# Patient Record
Sex: Male | Born: 1945 | Race: White | Hispanic: No | Smoking: Current some day smoker
Health system: Southern US, Community
[De-identification: ages and names within clinical notes are randomized; demographics above are authoritative.]

## PROBLEM LIST (undated history)

## (undated) DIAGNOSIS — J189 Pneumonia, unspecified organism: Secondary | ICD-10-CM

## (undated) DIAGNOSIS — C4490 Unspecified malignant neoplasm of skin, unspecified: Secondary | ICD-10-CM

## (undated) DIAGNOSIS — S82892K Other fracture of left lower leg, subsequent encounter for closed fracture with nonunion: Secondary | ICD-10-CM

## (undated) DIAGNOSIS — M549 Dorsalgia, unspecified: Secondary | ICD-10-CM

## (undated) DIAGNOSIS — E119 Type 2 diabetes mellitus without complications: Secondary | ICD-10-CM

## (undated) DIAGNOSIS — E539 Vitamin B deficiency, unspecified: Secondary | ICD-10-CM

## (undated) DIAGNOSIS — F419 Anxiety disorder, unspecified: Secondary | ICD-10-CM

## (undated) DIAGNOSIS — K219 Gastro-esophageal reflux disease without esophagitis: Secondary | ICD-10-CM

## (undated) DIAGNOSIS — M1990 Unspecified osteoarthritis, unspecified site: Secondary | ICD-10-CM

## (undated) DIAGNOSIS — I10 Essential (primary) hypertension: Secondary | ICD-10-CM

## (undated) DIAGNOSIS — F329 Major depressive disorder, single episode, unspecified: Secondary | ICD-10-CM

## (undated) DIAGNOSIS — H548 Legal blindness, as defined in USA: Secondary | ICD-10-CM

## (undated) DIAGNOSIS — F32A Depression, unspecified: Secondary | ICD-10-CM

## (undated) DIAGNOSIS — E7800 Pure hypercholesterolemia, unspecified: Secondary | ICD-10-CM

## (undated) DIAGNOSIS — Z87442 Personal history of urinary calculi: Secondary | ICD-10-CM

## (undated) DIAGNOSIS — G8929 Other chronic pain: Secondary | ICD-10-CM

## (undated) HISTORY — PX: CATARACT EXTRACTION W/ INTRAOCULAR LENS  IMPLANT, BILATERAL: SHX1307

## (undated) HISTORY — DX: Gastro-esophageal reflux disease without esophagitis: K21.9

## (undated) HISTORY — DX: Pure hypercholesterolemia, unspecified: E78.00

## (undated) HISTORY — DX: Vitamin B deficiency, unspecified: E53.9

## (undated) HISTORY — PX: FRACTURE SURGERY: SHX138

## (undated) HISTORY — DX: Essential (primary) hypertension: I10

---
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
## 2005-11-19 ENCOUNTER — Ambulatory Visit (HOSPITAL_COMMUNITY): Admission: RE | Admit: 2005-11-19 | Discharge: 2005-11-19 | Payer: Self-pay | Admitting: Ophthalmology

## 2011-03-20 ENCOUNTER — Other Ambulatory Visit (HOSPITAL_COMMUNITY): Payer: Self-pay | Admitting: Urology

## 2011-03-20 ENCOUNTER — Ambulatory Visit (HOSPITAL_COMMUNITY)
Admission: RE | Admit: 2011-03-20 | Discharge: 2011-03-20 | Disposition: A | Discharge status: Home / Self Care | Payer: Medicare Other | Source: Ambulatory Visit | Attending: Urology | Admitting: Urology

## 2011-03-20 DIAGNOSIS — R109 Unspecified abdominal pain: Secondary | ICD-10-CM

## 2011-03-20 DIAGNOSIS — R1032 Left lower quadrant pain: Secondary | ICD-10-CM | POA: Insufficient documentation

## 2015-03-26 DIAGNOSIS — M79671 Pain in right foot: Secondary | ICD-10-CM | POA: Diagnosis not present

## 2015-03-26 DIAGNOSIS — M25579 Pain in unspecified ankle and joints of unspecified foot: Secondary | ICD-10-CM | POA: Diagnosis not present

## 2015-03-26 DIAGNOSIS — M79672 Pain in left foot: Secondary | ICD-10-CM | POA: Diagnosis not present

## 2015-04-10 DIAGNOSIS — L110 Acquired keratosis follicularis: Secondary | ICD-10-CM | POA: Diagnosis not present

## 2015-04-10 DIAGNOSIS — E1140 Type 2 diabetes mellitus with diabetic neuropathy, unspecified: Secondary | ICD-10-CM | POA: Diagnosis not present

## 2015-04-10 DIAGNOSIS — L609 Nail disorder, unspecified: Secondary | ICD-10-CM | POA: Diagnosis not present

## 2015-04-16 DIAGNOSIS — E113519 Type 2 diabetes mellitus with proliferative diabetic retinopathy with macular edema, unspecified eye: Secondary | ICD-10-CM | POA: Diagnosis not present

## 2015-04-16 DIAGNOSIS — H40001 Preglaucoma, unspecified, right eye: Secondary | ICD-10-CM | POA: Diagnosis not present

## 2015-04-16 DIAGNOSIS — D3132 Benign neoplasm of left choroid: Secondary | ICD-10-CM | POA: Diagnosis not present

## 2015-04-16 DIAGNOSIS — Z961 Presence of intraocular lens: Secondary | ICD-10-CM | POA: Diagnosis not present

## 2015-04-16 DIAGNOSIS — E11311 Type 2 diabetes mellitus with unspecified diabetic retinopathy with macular edema: Secondary | ICD-10-CM | POA: Diagnosis not present

## 2015-04-29 DIAGNOSIS — F419 Anxiety disorder, unspecified: Secondary | ICD-10-CM | POA: Diagnosis not present

## 2015-04-29 DIAGNOSIS — I10 Essential (primary) hypertension: Secondary | ICD-10-CM | POA: Diagnosis not present

## 2015-04-29 DIAGNOSIS — E119 Type 2 diabetes mellitus without complications: Secondary | ICD-10-CM | POA: Diagnosis not present

## 2015-05-24 DIAGNOSIS — I739 Peripheral vascular disease, unspecified: Secondary | ICD-10-CM | POA: Diagnosis not present

## 2015-05-24 DIAGNOSIS — L603 Nail dystrophy: Secondary | ICD-10-CM | POA: Diagnosis not present

## 2015-05-24 DIAGNOSIS — E1142 Type 2 diabetes mellitus with diabetic polyneuropathy: Secondary | ICD-10-CM | POA: Diagnosis not present

## 2015-05-29 DIAGNOSIS — E1142 Type 2 diabetes mellitus with diabetic polyneuropathy: Secondary | ICD-10-CM | POA: Diagnosis not present

## 2015-05-29 DIAGNOSIS — I739 Peripheral vascular disease, unspecified: Secondary | ICD-10-CM | POA: Diagnosis not present

## 2015-05-30 DIAGNOSIS — J069 Acute upper respiratory infection, unspecified: Secondary | ICD-10-CM | POA: Diagnosis not present

## 2015-05-30 DIAGNOSIS — K219 Gastro-esophageal reflux disease without esophagitis: Secondary | ICD-10-CM | POA: Diagnosis not present

## 2015-06-05 DIAGNOSIS — Z85828 Personal history of other malignant neoplasm of skin: Secondary | ICD-10-CM | POA: Diagnosis not present

## 2015-06-05 DIAGNOSIS — L259 Unspecified contact dermatitis, unspecified cause: Secondary | ICD-10-CM | POA: Diagnosis not present

## 2015-06-05 DIAGNOSIS — L570 Actinic keratosis: Secondary | ICD-10-CM | POA: Diagnosis not present

## 2015-06-11 DIAGNOSIS — I10 Essential (primary) hypertension: Secondary | ICD-10-CM | POA: Diagnosis not present

## 2015-06-11 DIAGNOSIS — E119 Type 2 diabetes mellitus without complications: Secondary | ICD-10-CM | POA: Diagnosis not present

## 2015-06-11 DIAGNOSIS — E7800 Pure hypercholesterolemia, unspecified: Secondary | ICD-10-CM | POA: Diagnosis not present

## 2015-06-14 DIAGNOSIS — E1142 Type 2 diabetes mellitus with diabetic polyneuropathy: Secondary | ICD-10-CM | POA: Diagnosis not present

## 2015-07-09 DIAGNOSIS — E119 Type 2 diabetes mellitus without complications: Secondary | ICD-10-CM | POA: Diagnosis not present

## 2015-07-09 DIAGNOSIS — I10 Essential (primary) hypertension: Secondary | ICD-10-CM | POA: Diagnosis not present

## 2015-07-09 DIAGNOSIS — E7800 Pure hypercholesterolemia, unspecified: Secondary | ICD-10-CM | POA: Diagnosis not present

## 2015-07-30 DIAGNOSIS — E1140 Type 2 diabetes mellitus with diabetic neuropathy, unspecified: Secondary | ICD-10-CM | POA: Diagnosis not present

## 2015-07-30 DIAGNOSIS — F419 Anxiety disorder, unspecified: Secondary | ICD-10-CM | POA: Diagnosis not present

## 2015-07-30 DIAGNOSIS — Z87891 Personal history of nicotine dependence: Secondary | ICD-10-CM | POA: Diagnosis not present

## 2015-07-30 DIAGNOSIS — Z6831 Body mass index (BMI) 31.0-31.9, adult: Secondary | ICD-10-CM | POA: Diagnosis not present

## 2015-07-30 DIAGNOSIS — J069 Acute upper respiratory infection, unspecified: Secondary | ICD-10-CM | POA: Diagnosis not present

## 2015-08-01 DIAGNOSIS — E7800 Pure hypercholesterolemia, unspecified: Secondary | ICD-10-CM | POA: Diagnosis not present

## 2015-08-01 DIAGNOSIS — I10 Essential (primary) hypertension: Secondary | ICD-10-CM | POA: Diagnosis not present

## 2015-08-01 DIAGNOSIS — E119 Type 2 diabetes mellitus without complications: Secondary | ICD-10-CM | POA: Diagnosis not present

## 2015-08-14 DIAGNOSIS — H40053 Ocular hypertension, bilateral: Secondary | ICD-10-CM | POA: Diagnosis not present

## 2015-08-23 DIAGNOSIS — E7800 Pure hypercholesterolemia, unspecified: Secondary | ICD-10-CM | POA: Diagnosis not present

## 2015-08-23 DIAGNOSIS — I10 Essential (primary) hypertension: Secondary | ICD-10-CM | POA: Diagnosis not present

## 2015-08-23 DIAGNOSIS — E119 Type 2 diabetes mellitus without complications: Secondary | ICD-10-CM | POA: Diagnosis not present

## 2015-09-25 DIAGNOSIS — E119 Type 2 diabetes mellitus without complications: Secondary | ICD-10-CM | POA: Diagnosis not present

## 2015-09-25 DIAGNOSIS — Z720 Tobacco use: Secondary | ICD-10-CM | POA: Diagnosis not present

## 2015-09-25 DIAGNOSIS — J320 Chronic maxillary sinusitis: Secondary | ICD-10-CM | POA: Diagnosis not present

## 2015-10-09 DIAGNOSIS — E1140 Type 2 diabetes mellitus with diabetic neuropathy, unspecified: Secondary | ICD-10-CM | POA: Diagnosis not present

## 2015-10-09 DIAGNOSIS — L609 Nail disorder, unspecified: Secondary | ICD-10-CM | POA: Diagnosis not present

## 2015-10-09 DIAGNOSIS — L110 Acquired keratosis follicularis: Secondary | ICD-10-CM | POA: Diagnosis not present

## 2015-10-15 DIAGNOSIS — H40001 Preglaucoma, unspecified, right eye: Secondary | ICD-10-CM | POA: Diagnosis not present

## 2015-10-15 DIAGNOSIS — D3132 Benign neoplasm of left choroid: Secondary | ICD-10-CM | POA: Diagnosis not present

## 2015-10-15 DIAGNOSIS — E113513 Type 2 diabetes mellitus with proliferative diabetic retinopathy with macular edema, bilateral: Secondary | ICD-10-CM | POA: Diagnosis not present

## 2015-10-15 DIAGNOSIS — Z961 Presence of intraocular lens: Secondary | ICD-10-CM | POA: Diagnosis not present

## 2015-10-15 DIAGNOSIS — E11311 Type 2 diabetes mellitus with unspecified diabetic retinopathy with macular edema: Secondary | ICD-10-CM | POA: Diagnosis not present

## 2015-10-17 DIAGNOSIS — I10 Essential (primary) hypertension: Secondary | ICD-10-CM | POA: Diagnosis not present

## 2015-10-17 DIAGNOSIS — E7800 Pure hypercholesterolemia, unspecified: Secondary | ICD-10-CM | POA: Diagnosis not present

## 2015-10-17 DIAGNOSIS — E119 Type 2 diabetes mellitus without complications: Secondary | ICD-10-CM | POA: Diagnosis not present

## 2015-10-30 DIAGNOSIS — I10 Essential (primary) hypertension: Secondary | ICD-10-CM | POA: Diagnosis not present

## 2015-10-30 DIAGNOSIS — E119 Type 2 diabetes mellitus without complications: Secondary | ICD-10-CM | POA: Diagnosis not present

## 2015-10-30 DIAGNOSIS — E7800 Pure hypercholesterolemia, unspecified: Secondary | ICD-10-CM | POA: Diagnosis not present

## 2015-11-08 DIAGNOSIS — Z713 Dietary counseling and surveillance: Secondary | ICD-10-CM | POA: Diagnosis not present

## 2015-11-08 DIAGNOSIS — E1140 Type 2 diabetes mellitus with diabetic neuropathy, unspecified: Secondary | ICD-10-CM | POA: Diagnosis not present

## 2015-11-08 DIAGNOSIS — F419 Anxiety disorder, unspecified: Secondary | ICD-10-CM | POA: Diagnosis not present

## 2015-11-08 DIAGNOSIS — Z6830 Body mass index (BMI) 30.0-30.9, adult: Secondary | ICD-10-CM | POA: Diagnosis not present

## 2015-12-02 DIAGNOSIS — T18128A Food in esophagus causing other injury, initial encounter: Secondary | ICD-10-CM | POA: Diagnosis not present

## 2015-12-02 DIAGNOSIS — R1310 Dysphagia, unspecified: Secondary | ICD-10-CM | POA: Diagnosis not present

## 2015-12-02 DIAGNOSIS — Z79899 Other long term (current) drug therapy: Secondary | ICD-10-CM | POA: Diagnosis not present

## 2015-12-02 DIAGNOSIS — T182XXA Foreign body in stomach, initial encounter: Secondary | ICD-10-CM | POA: Diagnosis not present

## 2015-12-02 DIAGNOSIS — F17200 Nicotine dependence, unspecified, uncomplicated: Secondary | ICD-10-CM | POA: Diagnosis not present

## 2015-12-02 DIAGNOSIS — I10 Essential (primary) hypertension: Secondary | ICD-10-CM | POA: Diagnosis not present

## 2015-12-02 DIAGNOSIS — Z794 Long term (current) use of insulin: Secondary | ICD-10-CM | POA: Diagnosis not present

## 2015-12-02 DIAGNOSIS — E7800 Pure hypercholesterolemia, unspecified: Secondary | ICD-10-CM | POA: Diagnosis not present

## 2015-12-02 DIAGNOSIS — R0789 Other chest pain: Secondary | ICD-10-CM | POA: Diagnosis not present

## 2015-12-02 DIAGNOSIS — K449 Diaphragmatic hernia without obstruction or gangrene: Secondary | ICD-10-CM | POA: Diagnosis not present

## 2015-12-02 DIAGNOSIS — E119 Type 2 diabetes mellitus without complications: Secondary | ICD-10-CM | POA: Diagnosis not present

## 2015-12-02 DIAGNOSIS — Z7951 Long term (current) use of inhaled steroids: Secondary | ICD-10-CM | POA: Diagnosis not present

## 2015-12-02 DIAGNOSIS — X58XXXA Exposure to other specified factors, initial encounter: Secondary | ICD-10-CM | POA: Diagnosis not present

## 2015-12-02 DIAGNOSIS — T18108A Unspecified foreign body in esophagus causing other injury, initial encounter: Secondary | ICD-10-CM | POA: Diagnosis not present

## 2015-12-02 DIAGNOSIS — K219 Gastro-esophageal reflux disease without esophagitis: Secondary | ICD-10-CM | POA: Diagnosis not present

## 2015-12-10 DIAGNOSIS — L570 Actinic keratosis: Secondary | ICD-10-CM | POA: Diagnosis not present

## 2015-12-10 DIAGNOSIS — L259 Unspecified contact dermatitis, unspecified cause: Secondary | ICD-10-CM | POA: Diagnosis not present

## 2015-12-10 DIAGNOSIS — Z85828 Personal history of other malignant neoplasm of skin: Secondary | ICD-10-CM | POA: Diagnosis not present

## 2015-12-16 DIAGNOSIS — Z23 Encounter for immunization: Secondary | ICD-10-CM | POA: Diagnosis not present

## 2016-01-01 DIAGNOSIS — L110 Acquired keratosis follicularis: Secondary | ICD-10-CM | POA: Diagnosis not present

## 2016-01-01 DIAGNOSIS — E1140 Type 2 diabetes mellitus with diabetic neuropathy, unspecified: Secondary | ICD-10-CM | POA: Diagnosis not present

## 2016-01-01 DIAGNOSIS — L609 Nail disorder, unspecified: Secondary | ICD-10-CM | POA: Diagnosis not present

## 2016-01-31 DIAGNOSIS — E11319 Type 2 diabetes mellitus with unspecified diabetic retinopathy without macular edema: Secondary | ICD-10-CM | POA: Diagnosis not present

## 2016-01-31 DIAGNOSIS — Z79899 Other long term (current) drug therapy: Secondary | ICD-10-CM | POA: Diagnosis not present

## 2016-01-31 DIAGNOSIS — E539 Vitamin B deficiency, unspecified: Secondary | ICD-10-CM | POA: Diagnosis not present

## 2016-01-31 DIAGNOSIS — E7800 Pure hypercholesterolemia, unspecified: Secondary | ICD-10-CM | POA: Diagnosis not present

## 2016-01-31 DIAGNOSIS — J310 Chronic rhinitis: Secondary | ICD-10-CM | POA: Diagnosis not present

## 2016-01-31 DIAGNOSIS — Z1389 Encounter for screening for other disorder: Secondary | ICD-10-CM | POA: Diagnosis not present

## 2016-01-31 DIAGNOSIS — E1140 Type 2 diabetes mellitus with diabetic neuropathy, unspecified: Secondary | ICD-10-CM | POA: Diagnosis not present

## 2016-01-31 DIAGNOSIS — E559 Vitamin D deficiency, unspecified: Secondary | ICD-10-CM | POA: Diagnosis not present

## 2016-01-31 DIAGNOSIS — Z0000 Encounter for general adult medical examination without abnormal findings: Secondary | ICD-10-CM | POA: Diagnosis not present

## 2016-01-31 DIAGNOSIS — Z299 Encounter for prophylactic measures, unspecified: Secondary | ICD-10-CM | POA: Diagnosis not present

## 2016-01-31 DIAGNOSIS — Z125 Encounter for screening for malignant neoplasm of prostate: Secondary | ICD-10-CM | POA: Diagnosis not present

## 2016-01-31 DIAGNOSIS — Z6827 Body mass index (BMI) 27.0-27.9, adult: Secondary | ICD-10-CM | POA: Diagnosis not present

## 2016-01-31 DIAGNOSIS — F329 Major depressive disorder, single episode, unspecified: Secondary | ICD-10-CM | POA: Diagnosis not present

## 2016-01-31 DIAGNOSIS — Z1211 Encounter for screening for malignant neoplasm of colon: Secondary | ICD-10-CM | POA: Diagnosis not present

## 2016-01-31 DIAGNOSIS — Z7189 Other specified counseling: Secondary | ICD-10-CM | POA: Diagnosis not present

## 2016-01-31 DIAGNOSIS — R5383 Other fatigue: Secondary | ICD-10-CM | POA: Diagnosis not present

## 2016-02-03 DIAGNOSIS — E119 Type 2 diabetes mellitus without complications: Secondary | ICD-10-CM | POA: Diagnosis not present

## 2016-02-03 DIAGNOSIS — I10 Essential (primary) hypertension: Secondary | ICD-10-CM | POA: Diagnosis not present

## 2016-02-03 DIAGNOSIS — E7800 Pure hypercholesterolemia, unspecified: Secondary | ICD-10-CM | POA: Diagnosis not present

## 2016-02-14 DIAGNOSIS — E11319 Type 2 diabetes mellitus with unspecified diabetic retinopathy without macular edema: Secondary | ICD-10-CM | POA: Diagnosis not present

## 2016-02-14 DIAGNOSIS — F419 Anxiety disorder, unspecified: Secondary | ICD-10-CM | POA: Diagnosis not present

## 2016-02-14 DIAGNOSIS — Z299 Encounter for prophylactic measures, unspecified: Secondary | ICD-10-CM | POA: Diagnosis not present

## 2016-02-14 DIAGNOSIS — M545 Low back pain: Secondary | ICD-10-CM | POA: Diagnosis not present

## 2016-02-14 DIAGNOSIS — I10 Essential (primary) hypertension: Secondary | ICD-10-CM | POA: Diagnosis not present

## 2016-02-27 DIAGNOSIS — I10 Essential (primary) hypertension: Secondary | ICD-10-CM | POA: Diagnosis not present

## 2016-02-27 DIAGNOSIS — S60812A Abrasion of left wrist, initial encounter: Secondary | ICD-10-CM | POA: Diagnosis not present

## 2016-02-27 DIAGNOSIS — S60212A Contusion of left wrist, initial encounter: Secondary | ICD-10-CM | POA: Diagnosis not present

## 2016-02-27 DIAGNOSIS — E119 Type 2 diabetes mellitus without complications: Secondary | ICD-10-CM | POA: Diagnosis not present

## 2016-02-27 DIAGNOSIS — Z7984 Long term (current) use of oral hypoglycemic drugs: Secondary | ICD-10-CM | POA: Diagnosis not present

## 2016-02-27 DIAGNOSIS — S52572A Other intraarticular fracture of lower end of left radius, initial encounter for closed fracture: Secondary | ICD-10-CM | POA: Diagnosis not present

## 2016-02-27 DIAGNOSIS — W1839XA Other fall on same level, initial encounter: Secondary | ICD-10-CM | POA: Diagnosis not present

## 2016-02-27 DIAGNOSIS — Z79899 Other long term (current) drug therapy: Secondary | ICD-10-CM | POA: Diagnosis not present

## 2016-02-27 DIAGNOSIS — K219 Gastro-esophageal reflux disease without esophagitis: Secondary | ICD-10-CM | POA: Diagnosis not present

## 2016-02-27 DIAGNOSIS — F17200 Nicotine dependence, unspecified, uncomplicated: Secondary | ICD-10-CM | POA: Diagnosis not present

## 2016-03-06 DIAGNOSIS — M25532 Pain in left wrist: Secondary | ICD-10-CM | POA: Diagnosis not present

## 2016-03-06 DIAGNOSIS — S52532A Colles' fracture of left radius, initial encounter for closed fracture: Secondary | ICD-10-CM | POA: Diagnosis not present

## 2016-03-11 DIAGNOSIS — E119 Type 2 diabetes mellitus without complications: Secondary | ICD-10-CM | POA: Diagnosis not present

## 2016-03-11 DIAGNOSIS — E7800 Pure hypercholesterolemia, unspecified: Secondary | ICD-10-CM | POA: Diagnosis not present

## 2016-03-11 DIAGNOSIS — I10 Essential (primary) hypertension: Secondary | ICD-10-CM | POA: Diagnosis not present

## 2016-03-13 DIAGNOSIS — S52532A Colles' fracture of left radius, initial encounter for closed fracture: Secondary | ICD-10-CM | POA: Diagnosis not present

## 2016-03-23 HISTORY — PX: WRIST FRACTURE SURGERY: SHX121

## 2016-03-23 HISTORY — PX: MULTIPLE TOOTH EXTRACTIONS: SHX2053

## 2016-03-27 DIAGNOSIS — S52532A Colles' fracture of left radius, initial encounter for closed fracture: Secondary | ICD-10-CM | POA: Diagnosis not present

## 2016-04-07 DIAGNOSIS — I10 Essential (primary) hypertension: Secondary | ICD-10-CM | POA: Diagnosis not present

## 2016-04-07 DIAGNOSIS — E7800 Pure hypercholesterolemia, unspecified: Secondary | ICD-10-CM | POA: Diagnosis not present

## 2016-04-07 DIAGNOSIS — E119 Type 2 diabetes mellitus without complications: Secondary | ICD-10-CM | POA: Diagnosis not present

## 2016-04-13 DIAGNOSIS — M25532 Pain in left wrist: Secondary | ICD-10-CM | POA: Diagnosis not present

## 2016-04-13 DIAGNOSIS — S52532A Colles' fracture of left radius, initial encounter for closed fracture: Secondary | ICD-10-CM | POA: Diagnosis not present

## 2016-04-22 DIAGNOSIS — F17210 Nicotine dependence, cigarettes, uncomplicated: Secondary | ICD-10-CM | POA: Diagnosis not present

## 2016-04-22 DIAGNOSIS — W19XXXD Unspecified fall, subsequent encounter: Secondary | ICD-10-CM | POA: Diagnosis not present

## 2016-04-22 DIAGNOSIS — I10 Essential (primary) hypertension: Secondary | ICD-10-CM | POA: Diagnosis not present

## 2016-04-22 DIAGNOSIS — S6292XD Unspecified fracture of left wrist and hand, subsequent encounter for fracture with routine healing: Secondary | ICD-10-CM | POA: Diagnosis not present

## 2016-04-22 DIAGNOSIS — Z794 Long term (current) use of insulin: Secondary | ICD-10-CM | POA: Diagnosis not present

## 2016-04-22 DIAGNOSIS — H547 Unspecified visual loss: Secondary | ICD-10-CM | POA: Diagnosis not present

## 2016-04-22 DIAGNOSIS — E11319 Type 2 diabetes mellitus with unspecified diabetic retinopathy without macular edema: Secondary | ICD-10-CM | POA: Diagnosis not present

## 2016-04-23 DIAGNOSIS — I10 Essential (primary) hypertension: Secondary | ICD-10-CM | POA: Diagnosis not present

## 2016-04-23 DIAGNOSIS — S6292XD Unspecified fracture of left wrist and hand, subsequent encounter for fracture with routine healing: Secondary | ICD-10-CM | POA: Diagnosis not present

## 2016-04-23 DIAGNOSIS — E11319 Type 2 diabetes mellitus with unspecified diabetic retinopathy without macular edema: Secondary | ICD-10-CM | POA: Diagnosis not present

## 2016-04-23 DIAGNOSIS — Z794 Long term (current) use of insulin: Secondary | ICD-10-CM | POA: Diagnosis not present

## 2016-04-23 DIAGNOSIS — H547 Unspecified visual loss: Secondary | ICD-10-CM | POA: Diagnosis not present

## 2016-04-23 DIAGNOSIS — F17210 Nicotine dependence, cigarettes, uncomplicated: Secondary | ICD-10-CM | POA: Diagnosis not present

## 2016-04-24 DIAGNOSIS — E11319 Type 2 diabetes mellitus with unspecified diabetic retinopathy without macular edema: Secondary | ICD-10-CM | POA: Diagnosis not present

## 2016-04-24 DIAGNOSIS — Z794 Long term (current) use of insulin: Secondary | ICD-10-CM | POA: Diagnosis not present

## 2016-04-24 DIAGNOSIS — H547 Unspecified visual loss: Secondary | ICD-10-CM | POA: Diagnosis not present

## 2016-04-24 DIAGNOSIS — F17210 Nicotine dependence, cigarettes, uncomplicated: Secondary | ICD-10-CM | POA: Diagnosis not present

## 2016-04-24 DIAGNOSIS — I10 Essential (primary) hypertension: Secondary | ICD-10-CM | POA: Diagnosis not present

## 2016-04-24 DIAGNOSIS — S6292XD Unspecified fracture of left wrist and hand, subsequent encounter for fracture with routine healing: Secondary | ICD-10-CM | POA: Diagnosis not present

## 2016-04-27 DIAGNOSIS — F320 Major depressive disorder, single episode, mild: Secondary | ICD-10-CM | POA: Diagnosis not present

## 2016-04-27 DIAGNOSIS — Z0289 Encounter for other administrative examinations: Secondary | ICD-10-CM | POA: Diagnosis not present

## 2016-04-27 DIAGNOSIS — Z79899 Other long term (current) drug therapy: Secondary | ICD-10-CM | POA: Diagnosis not present

## 2016-04-28 DIAGNOSIS — F17210 Nicotine dependence, cigarettes, uncomplicated: Secondary | ICD-10-CM | POA: Diagnosis not present

## 2016-04-28 DIAGNOSIS — H547 Unspecified visual loss: Secondary | ICD-10-CM | POA: Diagnosis not present

## 2016-04-28 DIAGNOSIS — E11319 Type 2 diabetes mellitus with unspecified diabetic retinopathy without macular edema: Secondary | ICD-10-CM | POA: Diagnosis not present

## 2016-04-28 DIAGNOSIS — I10 Essential (primary) hypertension: Secondary | ICD-10-CM | POA: Diagnosis not present

## 2016-04-28 DIAGNOSIS — Z794 Long term (current) use of insulin: Secondary | ICD-10-CM | POA: Diagnosis not present

## 2016-04-28 DIAGNOSIS — S6292XD Unspecified fracture of left wrist and hand, subsequent encounter for fracture with routine healing: Secondary | ICD-10-CM | POA: Diagnosis not present

## 2016-04-30 DIAGNOSIS — Z794 Long term (current) use of insulin: Secondary | ICD-10-CM | POA: Diagnosis not present

## 2016-04-30 DIAGNOSIS — H547 Unspecified visual loss: Secondary | ICD-10-CM | POA: Diagnosis not present

## 2016-04-30 DIAGNOSIS — S6292XD Unspecified fracture of left wrist and hand, subsequent encounter for fracture with routine healing: Secondary | ICD-10-CM | POA: Diagnosis not present

## 2016-04-30 DIAGNOSIS — E11319 Type 2 diabetes mellitus with unspecified diabetic retinopathy without macular edema: Secondary | ICD-10-CM | POA: Diagnosis not present

## 2016-04-30 DIAGNOSIS — F17210 Nicotine dependence, cigarettes, uncomplicated: Secondary | ICD-10-CM | POA: Diagnosis not present

## 2016-04-30 DIAGNOSIS — I10 Essential (primary) hypertension: Secondary | ICD-10-CM | POA: Diagnosis not present

## 2016-05-05 DIAGNOSIS — H547 Unspecified visual loss: Secondary | ICD-10-CM | POA: Diagnosis not present

## 2016-05-05 DIAGNOSIS — S6292XD Unspecified fracture of left wrist and hand, subsequent encounter for fracture with routine healing: Secondary | ICD-10-CM | POA: Diagnosis not present

## 2016-05-05 DIAGNOSIS — E11319 Type 2 diabetes mellitus with unspecified diabetic retinopathy without macular edema: Secondary | ICD-10-CM | POA: Diagnosis not present

## 2016-05-05 DIAGNOSIS — Z794 Long term (current) use of insulin: Secondary | ICD-10-CM | POA: Diagnosis not present

## 2016-05-05 DIAGNOSIS — I10 Essential (primary) hypertension: Secondary | ICD-10-CM | POA: Diagnosis not present

## 2016-05-05 DIAGNOSIS — F17210 Nicotine dependence, cigarettes, uncomplicated: Secondary | ICD-10-CM | POA: Diagnosis not present

## 2016-05-07 DIAGNOSIS — E11319 Type 2 diabetes mellitus with unspecified diabetic retinopathy without macular edema: Secondary | ICD-10-CM | POA: Diagnosis not present

## 2016-05-07 DIAGNOSIS — S6292XD Unspecified fracture of left wrist and hand, subsequent encounter for fracture with routine healing: Secondary | ICD-10-CM | POA: Diagnosis not present

## 2016-05-07 DIAGNOSIS — I10 Essential (primary) hypertension: Secondary | ICD-10-CM | POA: Diagnosis not present

## 2016-05-07 DIAGNOSIS — F17210 Nicotine dependence, cigarettes, uncomplicated: Secondary | ICD-10-CM | POA: Diagnosis not present

## 2016-05-07 DIAGNOSIS — H547 Unspecified visual loss: Secondary | ICD-10-CM | POA: Diagnosis not present

## 2016-05-07 DIAGNOSIS — Z794 Long term (current) use of insulin: Secondary | ICD-10-CM | POA: Diagnosis not present

## 2016-05-12 DIAGNOSIS — S6292XD Unspecified fracture of left wrist and hand, subsequent encounter for fracture with routine healing: Secondary | ICD-10-CM | POA: Diagnosis not present

## 2016-05-12 DIAGNOSIS — H547 Unspecified visual loss: Secondary | ICD-10-CM | POA: Diagnosis not present

## 2016-05-12 DIAGNOSIS — Z794 Long term (current) use of insulin: Secondary | ICD-10-CM | POA: Diagnosis not present

## 2016-05-12 DIAGNOSIS — F17210 Nicotine dependence, cigarettes, uncomplicated: Secondary | ICD-10-CM | POA: Diagnosis not present

## 2016-05-12 DIAGNOSIS — E11319 Type 2 diabetes mellitus with unspecified diabetic retinopathy without macular edema: Secondary | ICD-10-CM | POA: Diagnosis not present

## 2016-05-12 DIAGNOSIS — I10 Essential (primary) hypertension: Secondary | ICD-10-CM | POA: Diagnosis not present

## 2016-05-15 DIAGNOSIS — F17210 Nicotine dependence, cigarettes, uncomplicated: Secondary | ICD-10-CM | POA: Diagnosis not present

## 2016-05-15 DIAGNOSIS — I10 Essential (primary) hypertension: Secondary | ICD-10-CM | POA: Diagnosis not present

## 2016-05-15 DIAGNOSIS — H547 Unspecified visual loss: Secondary | ICD-10-CM | POA: Diagnosis not present

## 2016-05-15 DIAGNOSIS — E11319 Type 2 diabetes mellitus with unspecified diabetic retinopathy without macular edema: Secondary | ICD-10-CM | POA: Diagnosis not present

## 2016-05-15 DIAGNOSIS — Z794 Long term (current) use of insulin: Secondary | ICD-10-CM | POA: Diagnosis not present

## 2016-05-15 DIAGNOSIS — S6292XD Unspecified fracture of left wrist and hand, subsequent encounter for fracture with routine healing: Secondary | ICD-10-CM | POA: Diagnosis not present

## 2016-05-19 DIAGNOSIS — F17210 Nicotine dependence, cigarettes, uncomplicated: Secondary | ICD-10-CM | POA: Diagnosis not present

## 2016-05-19 DIAGNOSIS — S6292XD Unspecified fracture of left wrist and hand, subsequent encounter for fracture with routine healing: Secondary | ICD-10-CM | POA: Diagnosis not present

## 2016-05-19 DIAGNOSIS — H547 Unspecified visual loss: Secondary | ICD-10-CM | POA: Diagnosis not present

## 2016-05-19 DIAGNOSIS — E11319 Type 2 diabetes mellitus with unspecified diabetic retinopathy without macular edema: Secondary | ICD-10-CM | POA: Diagnosis not present

## 2016-05-19 DIAGNOSIS — I10 Essential (primary) hypertension: Secondary | ICD-10-CM | POA: Diagnosis not present

## 2016-05-19 DIAGNOSIS — Z794 Long term (current) use of insulin: Secondary | ICD-10-CM | POA: Diagnosis not present

## 2016-05-21 DIAGNOSIS — E11319 Type 2 diabetes mellitus with unspecified diabetic retinopathy without macular edema: Secondary | ICD-10-CM | POA: Diagnosis not present

## 2016-05-21 DIAGNOSIS — K219 Gastro-esophageal reflux disease without esophagitis: Secondary | ICD-10-CM | POA: Diagnosis not present

## 2016-05-21 DIAGNOSIS — E663 Overweight: Secondary | ICD-10-CM | POA: Diagnosis not present

## 2016-05-21 DIAGNOSIS — I10 Essential (primary) hypertension: Secondary | ICD-10-CM | POA: Diagnosis not present

## 2016-05-21 DIAGNOSIS — Z299 Encounter for prophylactic measures, unspecified: Secondary | ICD-10-CM | POA: Diagnosis not present

## 2016-05-21 DIAGNOSIS — J069 Acute upper respiratory infection, unspecified: Secondary | ICD-10-CM | POA: Diagnosis not present

## 2016-05-21 DIAGNOSIS — Z713 Dietary counseling and surveillance: Secondary | ICD-10-CM | POA: Diagnosis not present

## 2016-05-21 DIAGNOSIS — E1142 Type 2 diabetes mellitus with diabetic polyneuropathy: Secondary | ICD-10-CM | POA: Diagnosis not present

## 2016-05-22 DIAGNOSIS — I10 Essential (primary) hypertension: Secondary | ICD-10-CM | POA: Diagnosis not present

## 2016-05-22 DIAGNOSIS — S6292XD Unspecified fracture of left wrist and hand, subsequent encounter for fracture with routine healing: Secondary | ICD-10-CM | POA: Diagnosis not present

## 2016-05-22 DIAGNOSIS — E11319 Type 2 diabetes mellitus with unspecified diabetic retinopathy without macular edema: Secondary | ICD-10-CM | POA: Diagnosis not present

## 2016-05-22 DIAGNOSIS — H547 Unspecified visual loss: Secondary | ICD-10-CM | POA: Diagnosis not present

## 2016-05-22 DIAGNOSIS — Z794 Long term (current) use of insulin: Secondary | ICD-10-CM | POA: Diagnosis not present

## 2016-05-22 DIAGNOSIS — F17210 Nicotine dependence, cigarettes, uncomplicated: Secondary | ICD-10-CM | POA: Diagnosis not present

## 2016-05-25 DIAGNOSIS — F17210 Nicotine dependence, cigarettes, uncomplicated: Secondary | ICD-10-CM | POA: Diagnosis not present

## 2016-05-25 DIAGNOSIS — S6292XD Unspecified fracture of left wrist and hand, subsequent encounter for fracture with routine healing: Secondary | ICD-10-CM | POA: Diagnosis not present

## 2016-05-25 DIAGNOSIS — E11319 Type 2 diabetes mellitus with unspecified diabetic retinopathy without macular edema: Secondary | ICD-10-CM | POA: Diagnosis not present

## 2016-05-25 DIAGNOSIS — H547 Unspecified visual loss: Secondary | ICD-10-CM | POA: Diagnosis not present

## 2016-05-25 DIAGNOSIS — Z794 Long term (current) use of insulin: Secondary | ICD-10-CM | POA: Diagnosis not present

## 2016-05-25 DIAGNOSIS — I10 Essential (primary) hypertension: Secondary | ICD-10-CM | POA: Diagnosis not present

## 2016-05-27 DIAGNOSIS — I10 Essential (primary) hypertension: Secondary | ICD-10-CM | POA: Diagnosis not present

## 2016-05-27 DIAGNOSIS — F17210 Nicotine dependence, cigarettes, uncomplicated: Secondary | ICD-10-CM | POA: Diagnosis not present

## 2016-05-27 DIAGNOSIS — E11319 Type 2 diabetes mellitus with unspecified diabetic retinopathy without macular edema: Secondary | ICD-10-CM | POA: Diagnosis not present

## 2016-05-27 DIAGNOSIS — S6292XD Unspecified fracture of left wrist and hand, subsequent encounter for fracture with routine healing: Secondary | ICD-10-CM | POA: Diagnosis not present

## 2016-05-27 DIAGNOSIS — Z794 Long term (current) use of insulin: Secondary | ICD-10-CM | POA: Diagnosis not present

## 2016-05-27 DIAGNOSIS — H547 Unspecified visual loss: Secondary | ICD-10-CM | POA: Diagnosis not present

## 2016-06-10 DIAGNOSIS — Z85828 Personal history of other malignant neoplasm of skin: Secondary | ICD-10-CM | POA: Diagnosis not present

## 2016-06-10 DIAGNOSIS — L259 Unspecified contact dermatitis, unspecified cause: Secondary | ICD-10-CM | POA: Diagnosis not present

## 2016-06-10 DIAGNOSIS — L570 Actinic keratosis: Secondary | ICD-10-CM | POA: Diagnosis not present

## 2016-06-22 DIAGNOSIS — G5602 Carpal tunnel syndrome, left upper limb: Secondary | ICD-10-CM | POA: Diagnosis not present

## 2016-06-22 DIAGNOSIS — S52532A Colles' fracture of left radius, initial encounter for closed fracture: Secondary | ICD-10-CM | POA: Diagnosis not present

## 2016-07-22 DIAGNOSIS — L609 Nail disorder, unspecified: Secondary | ICD-10-CM | POA: Diagnosis not present

## 2016-07-22 DIAGNOSIS — L110 Acquired keratosis follicularis: Secondary | ICD-10-CM | POA: Diagnosis not present

## 2016-07-22 DIAGNOSIS — E1140 Type 2 diabetes mellitus with diabetic neuropathy, unspecified: Secondary | ICD-10-CM | POA: Diagnosis not present

## 2016-07-28 DIAGNOSIS — D3132 Benign neoplasm of left choroid: Secondary | ICD-10-CM | POA: Diagnosis not present

## 2016-07-28 DIAGNOSIS — Z961 Presence of intraocular lens: Secondary | ICD-10-CM | POA: Diagnosis not present

## 2016-07-28 DIAGNOSIS — E113513 Type 2 diabetes mellitus with proliferative diabetic retinopathy with macular edema, bilateral: Secondary | ICD-10-CM | POA: Diagnosis not present

## 2016-07-28 DIAGNOSIS — E11311 Type 2 diabetes mellitus with unspecified diabetic retinopathy with macular edema: Secondary | ICD-10-CM | POA: Diagnosis not present

## 2016-07-28 DIAGNOSIS — H40001 Preglaucoma, unspecified, right eye: Secondary | ICD-10-CM | POA: Diagnosis not present

## 2016-08-03 DIAGNOSIS — G5602 Carpal tunnel syndrome, left upper limb: Secondary | ICD-10-CM | POA: Diagnosis not present

## 2016-08-03 DIAGNOSIS — S52532A Colles' fracture of left radius, initial encounter for closed fracture: Secondary | ICD-10-CM | POA: Diagnosis not present

## 2016-08-11 DIAGNOSIS — I10 Essential (primary) hypertension: Secondary | ICD-10-CM | POA: Diagnosis not present

## 2016-08-11 DIAGNOSIS — E119 Type 2 diabetes mellitus without complications: Secondary | ICD-10-CM | POA: Diagnosis not present

## 2016-08-11 DIAGNOSIS — E7800 Pure hypercholesterolemia, unspecified: Secondary | ICD-10-CM | POA: Diagnosis not present

## 2016-08-12 DIAGNOSIS — I10 Essential (primary) hypertension: Secondary | ICD-10-CM | POA: Diagnosis not present

## 2016-08-12 DIAGNOSIS — G5602 Carpal tunnel syndrome, left upper limb: Secondary | ICD-10-CM | POA: Diagnosis not present

## 2016-08-12 DIAGNOSIS — E119 Type 2 diabetes mellitus without complications: Secondary | ICD-10-CM | POA: Diagnosis not present

## 2016-08-12 DIAGNOSIS — M25532 Pain in left wrist: Secondary | ICD-10-CM | POA: Diagnosis not present

## 2016-08-13 DIAGNOSIS — E1140 Type 2 diabetes mellitus with diabetic neuropathy, unspecified: Secondary | ICD-10-CM | POA: Diagnosis not present

## 2016-08-13 DIAGNOSIS — G5602 Carpal tunnel syndrome, left upper limb: Secondary | ICD-10-CM | POA: Diagnosis not present

## 2016-08-21 DIAGNOSIS — G5602 Carpal tunnel syndrome, left upper limb: Secondary | ICD-10-CM | POA: Diagnosis not present

## 2016-08-21 DIAGNOSIS — S52532A Colles' fracture of left radius, initial encounter for closed fracture: Secondary | ICD-10-CM | POA: Diagnosis not present

## 2016-08-21 DIAGNOSIS — E1142 Type 2 diabetes mellitus with diabetic polyneuropathy: Secondary | ICD-10-CM | POA: Diagnosis not present

## 2016-08-27 DIAGNOSIS — K219 Gastro-esophageal reflux disease without esophagitis: Secondary | ICD-10-CM | POA: Diagnosis not present

## 2016-08-27 DIAGNOSIS — Z299 Encounter for prophylactic measures, unspecified: Secondary | ICD-10-CM | POA: Diagnosis not present

## 2016-08-27 DIAGNOSIS — E1142 Type 2 diabetes mellitus with diabetic polyneuropathy: Secondary | ICD-10-CM | POA: Diagnosis not present

## 2016-08-27 DIAGNOSIS — E11319 Type 2 diabetes mellitus with unspecified diabetic retinopathy without macular edema: Secondary | ICD-10-CM | POA: Diagnosis not present

## 2016-08-27 DIAGNOSIS — E7800 Pure hypercholesterolemia, unspecified: Secondary | ICD-10-CM | POA: Diagnosis not present

## 2016-08-27 DIAGNOSIS — E1165 Type 2 diabetes mellitus with hyperglycemia: Secondary | ICD-10-CM | POA: Diagnosis not present

## 2016-08-27 DIAGNOSIS — Z6826 Body mass index (BMI) 26.0-26.9, adult: Secondary | ICD-10-CM | POA: Diagnosis not present

## 2016-08-27 DIAGNOSIS — F329 Major depressive disorder, single episode, unspecified: Secondary | ICD-10-CM | POA: Diagnosis not present

## 2016-08-27 DIAGNOSIS — F419 Anxiety disorder, unspecified: Secondary | ICD-10-CM | POA: Diagnosis not present

## 2016-08-27 DIAGNOSIS — I10 Essential (primary) hypertension: Secondary | ICD-10-CM | POA: Diagnosis not present

## 2016-08-27 DIAGNOSIS — J069 Acute upper respiratory infection, unspecified: Secondary | ICD-10-CM | POA: Diagnosis not present

## 2016-08-31 DIAGNOSIS — F320 Major depressive disorder, single episode, mild: Secondary | ICD-10-CM | POA: Diagnosis not present

## 2016-08-31 DIAGNOSIS — Z79899 Other long term (current) drug therapy: Secondary | ICD-10-CM | POA: Diagnosis not present

## 2016-09-08 DIAGNOSIS — F419 Anxiety disorder, unspecified: Secondary | ICD-10-CM | POA: Diagnosis not present

## 2016-09-08 DIAGNOSIS — K219 Gastro-esophageal reflux disease without esophagitis: Secondary | ICD-10-CM | POA: Diagnosis not present

## 2016-09-08 DIAGNOSIS — E1142 Type 2 diabetes mellitus with diabetic polyneuropathy: Secondary | ICD-10-CM | POA: Diagnosis not present

## 2016-09-08 DIAGNOSIS — G5602 Carpal tunnel syndrome, left upper limb: Secondary | ICD-10-CM | POA: Diagnosis not present

## 2016-09-08 DIAGNOSIS — Z794 Long term (current) use of insulin: Secondary | ICD-10-CM | POA: Diagnosis not present

## 2016-09-08 DIAGNOSIS — E119 Type 2 diabetes mellitus without complications: Secondary | ICD-10-CM | POA: Diagnosis not present

## 2016-09-08 DIAGNOSIS — Z79899 Other long term (current) drug therapy: Secondary | ICD-10-CM | POA: Diagnosis not present

## 2016-09-08 DIAGNOSIS — F17210 Nicotine dependence, cigarettes, uncomplicated: Secondary | ICD-10-CM | POA: Diagnosis not present

## 2016-09-08 DIAGNOSIS — I10 Essential (primary) hypertension: Secondary | ICD-10-CM | POA: Diagnosis not present

## 2016-09-08 DIAGNOSIS — Z7984 Long term (current) use of oral hypoglycemic drugs: Secondary | ICD-10-CM | POA: Diagnosis not present

## 2016-10-14 DIAGNOSIS — E1140 Type 2 diabetes mellitus with diabetic neuropathy, unspecified: Secondary | ICD-10-CM | POA: Diagnosis not present

## 2016-10-14 DIAGNOSIS — L110 Acquired keratosis follicularis: Secondary | ICD-10-CM | POA: Diagnosis not present

## 2016-10-14 DIAGNOSIS — L609 Nail disorder, unspecified: Secondary | ICD-10-CM | POA: Diagnosis not present

## 2016-10-30 DIAGNOSIS — E119 Type 2 diabetes mellitus without complications: Secondary | ICD-10-CM | POA: Diagnosis not present

## 2016-10-30 DIAGNOSIS — E7800 Pure hypercholesterolemia, unspecified: Secondary | ICD-10-CM | POA: Diagnosis not present

## 2016-10-30 DIAGNOSIS — I10 Essential (primary) hypertension: Secondary | ICD-10-CM | POA: Diagnosis not present

## 2016-11-12 DIAGNOSIS — I10 Essential (primary) hypertension: Secondary | ICD-10-CM | POA: Diagnosis not present

## 2016-11-12 DIAGNOSIS — Z79891 Long term (current) use of opiate analgesic: Secondary | ICD-10-CM | POA: Diagnosis not present

## 2016-11-12 DIAGNOSIS — G5602 Carpal tunnel syndrome, left upper limb: Secondary | ICD-10-CM | POA: Diagnosis not present

## 2016-11-12 DIAGNOSIS — M25532 Pain in left wrist: Secondary | ICD-10-CM | POA: Diagnosis not present

## 2016-11-12 DIAGNOSIS — E119 Type 2 diabetes mellitus without complications: Secondary | ICD-10-CM | POA: Diagnosis not present

## 2016-12-02 DIAGNOSIS — F17210 Nicotine dependence, cigarettes, uncomplicated: Secondary | ICD-10-CM | POA: Diagnosis not present

## 2016-12-02 DIAGNOSIS — Z6827 Body mass index (BMI) 27.0-27.9, adult: Secondary | ICD-10-CM | POA: Diagnosis not present

## 2016-12-02 DIAGNOSIS — J320 Chronic maxillary sinusitis: Secondary | ICD-10-CM | POA: Diagnosis not present

## 2016-12-02 DIAGNOSIS — Z713 Dietary counseling and surveillance: Secondary | ICD-10-CM | POA: Diagnosis not present

## 2016-12-02 DIAGNOSIS — E11319 Type 2 diabetes mellitus with unspecified diabetic retinopathy without macular edema: Secondary | ICD-10-CM | POA: Diagnosis not present

## 2016-12-02 DIAGNOSIS — E7800 Pure hypercholesterolemia, unspecified: Secondary | ICD-10-CM | POA: Diagnosis not present

## 2016-12-02 DIAGNOSIS — Z299 Encounter for prophylactic measures, unspecified: Secondary | ICD-10-CM | POA: Diagnosis not present

## 2016-12-02 DIAGNOSIS — E1165 Type 2 diabetes mellitus with hyperglycemia: Secondary | ICD-10-CM | POA: Diagnosis not present

## 2016-12-02 DIAGNOSIS — I10 Essential (primary) hypertension: Secondary | ICD-10-CM | POA: Diagnosis not present

## 2016-12-10 DIAGNOSIS — I10 Essential (primary) hypertension: Secondary | ICD-10-CM | POA: Diagnosis not present

## 2016-12-10 DIAGNOSIS — Z79891 Long term (current) use of opiate analgesic: Secondary | ICD-10-CM | POA: Diagnosis not present

## 2016-12-10 DIAGNOSIS — E119 Type 2 diabetes mellitus without complications: Secondary | ICD-10-CM | POA: Diagnosis not present

## 2016-12-10 DIAGNOSIS — M25532 Pain in left wrist: Secondary | ICD-10-CM | POA: Diagnosis not present

## 2016-12-10 DIAGNOSIS — Z79899 Other long term (current) drug therapy: Secondary | ICD-10-CM | POA: Diagnosis not present

## 2016-12-14 DIAGNOSIS — L570 Actinic keratosis: Secondary | ICD-10-CM | POA: Diagnosis not present

## 2016-12-14 DIAGNOSIS — L259 Unspecified contact dermatitis, unspecified cause: Secondary | ICD-10-CM | POA: Diagnosis not present

## 2016-12-14 DIAGNOSIS — Z85828 Personal history of other malignant neoplasm of skin: Secondary | ICD-10-CM | POA: Diagnosis not present

## 2016-12-23 DIAGNOSIS — Z23 Encounter for immunization: Secondary | ICD-10-CM | POA: Diagnosis not present

## 2016-12-23 DIAGNOSIS — I10 Essential (primary) hypertension: Secondary | ICD-10-CM | POA: Diagnosis not present

## 2016-12-23 DIAGNOSIS — E119 Type 2 diabetes mellitus without complications: Secondary | ICD-10-CM | POA: Diagnosis not present

## 2016-12-23 DIAGNOSIS — E7800 Pure hypercholesterolemia, unspecified: Secondary | ICD-10-CM | POA: Diagnosis not present

## 2017-01-06 DIAGNOSIS — L110 Acquired keratosis follicularis: Secondary | ICD-10-CM | POA: Diagnosis not present

## 2017-01-06 DIAGNOSIS — E1140 Type 2 diabetes mellitus with diabetic neuropathy, unspecified: Secondary | ICD-10-CM | POA: Diagnosis not present

## 2017-01-06 DIAGNOSIS — L609 Nail disorder, unspecified: Secondary | ICD-10-CM | POA: Diagnosis not present

## 2017-01-07 DIAGNOSIS — M25532 Pain in left wrist: Secondary | ICD-10-CM | POA: Diagnosis not present

## 2017-01-07 DIAGNOSIS — M25559 Pain in unspecified hip: Secondary | ICD-10-CM | POA: Diagnosis not present

## 2017-01-07 DIAGNOSIS — Z79899 Other long term (current) drug therapy: Secondary | ICD-10-CM | POA: Diagnosis not present

## 2017-01-07 DIAGNOSIS — M545 Low back pain: Secondary | ICD-10-CM | POA: Diagnosis not present

## 2017-01-08 DIAGNOSIS — M25551 Pain in right hip: Secondary | ICD-10-CM | POA: Diagnosis not present

## 2017-01-08 DIAGNOSIS — M545 Low back pain: Secondary | ICD-10-CM | POA: Diagnosis not present

## 2017-01-08 DIAGNOSIS — M25552 Pain in left hip: Secondary | ICD-10-CM | POA: Diagnosis not present

## 2017-01-08 DIAGNOSIS — M47816 Spondylosis without myelopathy or radiculopathy, lumbar region: Secondary | ICD-10-CM | POA: Diagnosis not present

## 2017-01-08 DIAGNOSIS — M4686 Other specified inflammatory spondylopathies, lumbar region: Secondary | ICD-10-CM | POA: Diagnosis not present

## 2017-02-04 DIAGNOSIS — Z1211 Encounter for screening for malignant neoplasm of colon: Secondary | ICD-10-CM | POA: Diagnosis not present

## 2017-02-04 DIAGNOSIS — Z299 Encounter for prophylactic measures, unspecified: Secondary | ICD-10-CM | POA: Diagnosis not present

## 2017-02-04 DIAGNOSIS — F419 Anxiety disorder, unspecified: Secondary | ICD-10-CM | POA: Diagnosis not present

## 2017-02-04 DIAGNOSIS — R5383 Other fatigue: Secondary | ICD-10-CM | POA: Diagnosis not present

## 2017-02-04 DIAGNOSIS — E559 Vitamin D deficiency, unspecified: Secondary | ICD-10-CM | POA: Diagnosis not present

## 2017-02-04 DIAGNOSIS — R1310 Dysphagia, unspecified: Secondary | ICD-10-CM | POA: Diagnosis not present

## 2017-02-04 DIAGNOSIS — Z125 Encounter for screening for malignant neoplasm of prostate: Secondary | ICD-10-CM | POA: Diagnosis not present

## 2017-02-04 DIAGNOSIS — Z79899 Other long term (current) drug therapy: Secondary | ICD-10-CM | POA: Diagnosis not present

## 2017-02-04 DIAGNOSIS — J329 Chronic sinusitis, unspecified: Secondary | ICD-10-CM | POA: Diagnosis not present

## 2017-02-04 DIAGNOSIS — Z6827 Body mass index (BMI) 27.0-27.9, adult: Secondary | ICD-10-CM | POA: Diagnosis not present

## 2017-02-04 DIAGNOSIS — Z1331 Encounter for screening for depression: Secondary | ICD-10-CM | POA: Diagnosis not present

## 2017-02-04 DIAGNOSIS — Z1339 Encounter for screening examination for other mental health and behavioral disorders: Secondary | ICD-10-CM | POA: Diagnosis not present

## 2017-02-04 DIAGNOSIS — E7800 Pure hypercholesterolemia, unspecified: Secondary | ICD-10-CM | POA: Diagnosis not present

## 2017-02-04 DIAGNOSIS — Z0000 Encounter for general adult medical examination without abnormal findings: Secondary | ICD-10-CM | POA: Diagnosis not present

## 2017-02-04 DIAGNOSIS — E538 Deficiency of other specified B group vitamins: Secondary | ICD-10-CM | POA: Diagnosis not present

## 2017-02-04 DIAGNOSIS — Z7189 Other specified counseling: Secondary | ICD-10-CM | POA: Diagnosis not present

## 2017-02-08 ENCOUNTER — Encounter (INDEPENDENT_AMBULATORY_CARE_PROVIDER_SITE_OTHER): Payer: Self-pay | Admitting: Internal Medicine

## 2017-02-08 ENCOUNTER — Encounter (INDEPENDENT_AMBULATORY_CARE_PROVIDER_SITE_OTHER): Payer: Self-pay

## 2017-02-09 DIAGNOSIS — M25532 Pain in left wrist: Secondary | ICD-10-CM | POA: Diagnosis not present

## 2017-02-09 DIAGNOSIS — M545 Low back pain: Secondary | ICD-10-CM | POA: Diagnosis not present

## 2017-02-09 DIAGNOSIS — Z79891 Long term (current) use of opiate analgesic: Secondary | ICD-10-CM | POA: Diagnosis not present

## 2017-02-09 DIAGNOSIS — M25559 Pain in unspecified hip: Secondary | ICD-10-CM | POA: Diagnosis not present

## 2017-02-15 DIAGNOSIS — Z79899 Other long term (current) drug therapy: Secondary | ICD-10-CM | POA: Diagnosis not present

## 2017-02-15 DIAGNOSIS — F320 Major depressive disorder, single episode, mild: Secondary | ICD-10-CM | POA: Diagnosis not present

## 2017-02-17 DIAGNOSIS — E7800 Pure hypercholesterolemia, unspecified: Secondary | ICD-10-CM | POA: Diagnosis not present

## 2017-02-17 DIAGNOSIS — I10 Essential (primary) hypertension: Secondary | ICD-10-CM | POA: Diagnosis not present

## 2017-02-17 DIAGNOSIS — E119 Type 2 diabetes mellitus without complications: Secondary | ICD-10-CM | POA: Diagnosis not present

## 2017-02-23 ENCOUNTER — Ambulatory Visit (INDEPENDENT_AMBULATORY_CARE_PROVIDER_SITE_OTHER): Payer: Medicare Other | Admitting: Internal Medicine

## 2017-03-04 DIAGNOSIS — E119 Type 2 diabetes mellitus without complications: Secondary | ICD-10-CM | POA: Diagnosis not present

## 2017-03-04 DIAGNOSIS — I10 Essential (primary) hypertension: Secondary | ICD-10-CM | POA: Diagnosis not present

## 2017-03-04 DIAGNOSIS — E7800 Pure hypercholesterolemia, unspecified: Secondary | ICD-10-CM | POA: Diagnosis not present

## 2017-03-05 ENCOUNTER — Ambulatory Visit (INDEPENDENT_AMBULATORY_CARE_PROVIDER_SITE_OTHER): Payer: Medicare Other | Admitting: Internal Medicine

## 2017-03-09 ENCOUNTER — Ambulatory Visit (INDEPENDENT_AMBULATORY_CARE_PROVIDER_SITE_OTHER): Payer: Medicare Other | Admitting: Internal Medicine

## 2017-03-09 ENCOUNTER — Encounter (INDEPENDENT_AMBULATORY_CARE_PROVIDER_SITE_OTHER): Payer: Self-pay | Admitting: Internal Medicine

## 2017-03-09 ENCOUNTER — Encounter (INDEPENDENT_AMBULATORY_CARE_PROVIDER_SITE_OTHER): Payer: Self-pay | Admitting: *Deleted

## 2017-03-09 VITALS — BP 110/56 | HR 60 | Temp 97.3°F | Ht 67.0 in | Wt 172.1 lb

## 2017-03-09 DIAGNOSIS — R1310 Dysphagia, unspecified: Secondary | ICD-10-CM | POA: Diagnosis not present

## 2017-03-09 DIAGNOSIS — K219 Gastro-esophageal reflux disease without esophagitis: Secondary | ICD-10-CM | POA: Insufficient documentation

## 2017-03-09 DIAGNOSIS — E539 Vitamin B deficiency, unspecified: Secondary | ICD-10-CM

## 2017-03-09 DIAGNOSIS — E7800 Pure hypercholesterolemia, unspecified: Secondary | ICD-10-CM | POA: Insufficient documentation

## 2017-03-09 DIAGNOSIS — I10 Essential (primary) hypertension: Secondary | ICD-10-CM | POA: Insufficient documentation

## 2017-03-09 DIAGNOSIS — E119 Type 2 diabetes mellitus without complications: Secondary | ICD-10-CM | POA: Insufficient documentation

## 2017-03-09 DIAGNOSIS — R1319 Other dysphagia: Secondary | ICD-10-CM

## 2017-03-09 HISTORY — DX: Vitamin B deficiency, unspecified: E53.9

## 2017-03-09 NOTE — Patient Instructions (Signed)
DG esophogram

## 2017-03-09 NOTE — Progress Notes (Signed)
Subjective:    Patient ID: Jason Woods, male    DOB: 1945/08/07, 71 y.o.   MRN: [id number]  Patient brought list of medicine from [facility]. Each drug was reviewed with patient.   HPI Referred by Dr Woody Seller for dysphagia. He tells me he choked on a piece of chicken 2-3 months ago. Occurring 2-3 months. He says he chews slow.  Has trouble with beef and bread. Dysphagia does not occur often.  Appetite is good. No weight loss. No abdominal pain.  BMs move okay. Last colonoscopy years ago.   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  Diabetes x 40 yrs.  States he is not taking his Baby ASA.   02/05/2017 H and H 13.7 and 40.3 AST 14, ALP 68, ALT 12, total bili 0.4   Review of Systems Past Medical History:  Diagnosis Date  . Diabetes ([place])   . Diabetes ([place]) 03/09/2017  . GERD (gastroesophageal reflux disease)   . High cholesterol   . Hypertension        Allergies  Allergen Reactions  . Celexa [Citalopram Hydrobromide]     Nausea,blurred vision  . Crestor [Rosuvastatin]     Pain, aching muscles  . Lipitor [Atorvastatin]     Aching, MS  . Mobic [Meloxicam]     nausea  . Prozac [Fluoxetine Hcl]     fatigue  . Zocor [Simvastatin-High Dose]     MS aches   Current Outpatient Medications  Medication Sig Dispense Refill  . ALPRAZolam (XANAX) 1 MG tablet Take 1 mg by mouth 3 (three) times daily.    [name] amLODipine (NORVASC) 5 MG tablet Take 5 mg by mouth daily.    . bimatoprost (LUMIGAN) 0.01 % SOLN Place 1 drop into the right eye at bedtime.    . citalopram (CELEXA) 20 MG tablet Take 20 mg by mouth daily.    . fluticasone (FLONASE) 50 MCG/ACT nasal spray Place into both nostrils daily.    [name] gabapentin (NEURONTIN) 300 MG capsule Take 300 mg by mouth 2 (two) times daily.    . hydrochlorothiazide (HYDRODIURIL) 25 MG tablet Take 25 mg by mouth daily.    . Insulin NPH Human, Isophane, (NOVOLIN N [place]) Inject into the skin. 50 units in am and 30 units in pm    . losartan (COZAAR) 100 MG  tablet Take 100 mg by mouth daily.    . metFORMIN (GLUCOPHAGE) 850 MG tablet Take 850 mg by mouth 2 (two) times daily with a meal.    . metoprolol succinate (TOPROL-XL) 50 MG 24 hr tablet Take 50 mg by mouth daily. Take with or immediately following a meal.    . oxyCODONE-acetaminophen (PERCOCET) 7.5-325 MG tablet Take 1 tablet by mouth every 4 (four) hours as needed for severe pain.    . pantoprazole (PROTONIX) 40 MG tablet Take 40 mg by mouth daily.    . rosuvastatin (CRESTOR) 5 MG tablet Take 5 mg by mouth daily.     No current facility-administered medications for this visit.           Objective:   Physical Exam Blood pressure (!) 110/56, pulse 60, temperature (!) 97.3 F (36.3 C), height 5\' 7"  (1.702 m), weight 172 lb 1.6 oz (78.1 kg).  Alert and oriented. Skin warm and dry. Oral mucosa is moist.   . Sclera anicteric, conjunctivae is pink. Thyroid not enlarged. No cervical lymphadenopathy. Lungs clear. Heart regular rate and rhythm.  Abdomen is soft. Bowel sounds are positive. No hepatomegaly. No  abdominal masses felt. No tenderness.  No edema to lower extremities. Patient is alert and oriented.        Assessment & Plan:  Dysphagia. DG esophagram.  Further recommendations to follow.

## 2017-03-10 DIAGNOSIS — E1165 Type 2 diabetes mellitus with hyperglycemia: Secondary | ICD-10-CM | POA: Diagnosis not present

## 2017-03-10 DIAGNOSIS — E1142 Type 2 diabetes mellitus with diabetic polyneuropathy: Secondary | ICD-10-CM | POA: Diagnosis not present

## 2017-03-10 DIAGNOSIS — Z299 Encounter for prophylactic measures, unspecified: Secondary | ICD-10-CM | POA: Diagnosis not present

## 2017-03-10 DIAGNOSIS — F17210 Nicotine dependence, cigarettes, uncomplicated: Secondary | ICD-10-CM | POA: Diagnosis not present

## 2017-03-10 DIAGNOSIS — J069 Acute upper respiratory infection, unspecified: Secondary | ICD-10-CM | POA: Diagnosis not present

## 2017-03-10 DIAGNOSIS — Z6826 Body mass index (BMI) 26.0-26.9, adult: Secondary | ICD-10-CM | POA: Diagnosis not present

## 2017-03-30 ENCOUNTER — Ambulatory Visit (HOSPITAL_COMMUNITY)
Admission: RE | Admit: 2017-03-30 | Discharge: 2017-03-30 | Disposition: A | Discharge status: Home / Self Care | Payer: Medicare Other | Source: Ambulatory Visit | Attending: Internal Medicine | Admitting: Internal Medicine

## 2017-03-30 ENCOUNTER — Other Ambulatory Visit (INDEPENDENT_AMBULATORY_CARE_PROVIDER_SITE_OTHER): Payer: Self-pay | Admitting: Internal Medicine

## 2017-03-30 ENCOUNTER — Telehealth (INDEPENDENT_AMBULATORY_CARE_PROVIDER_SITE_OTHER): Payer: Self-pay | Admitting: Internal Medicine

## 2017-03-30 DIAGNOSIS — R1310 Dysphagia, unspecified: Secondary | ICD-10-CM | POA: Insufficient documentation

## 2017-03-30 DIAGNOSIS — K449 Diaphragmatic hernia without obstruction or gangrene: Secondary | ICD-10-CM | POA: Diagnosis not present

## 2017-03-30 DIAGNOSIS — R1319 Other dysphagia: Secondary | ICD-10-CM

## 2017-03-30 DIAGNOSIS — K219 Gastro-esophageal reflux disease without esophagitis: Secondary | ICD-10-CM | POA: Diagnosis not present

## 2017-03-30 DIAGNOSIS — R933 Abnormal findings on diagnostic imaging of other parts of digestive tract: Secondary | ICD-10-CM

## 2017-03-31 ENCOUNTER — Encounter (INDEPENDENT_AMBULATORY_CARE_PROVIDER_SITE_OTHER): Payer: Self-pay | Admitting: *Deleted

## 2017-03-31 DIAGNOSIS — R933 Abnormal findings on diagnostic imaging of other parts of digestive tract: Secondary | ICD-10-CM | POA: Insufficient documentation

## 2017-03-31 DIAGNOSIS — R1319 Other dysphagia: Secondary | ICD-10-CM | POA: Insufficient documentation

## 2017-03-31 DIAGNOSIS — R1310 Dysphagia, unspecified: Secondary | ICD-10-CM | POA: Insufficient documentation

## 2017-03-31 DIAGNOSIS — L110 Acquired keratosis follicularis: Secondary | ICD-10-CM | POA: Diagnosis not present

## 2017-03-31 DIAGNOSIS — L609 Nail disorder, unspecified: Secondary | ICD-10-CM | POA: Diagnosis not present

## 2017-03-31 DIAGNOSIS — E1140 Type 2 diabetes mellitus with diabetic neuropathy, unspecified: Secondary | ICD-10-CM | POA: Diagnosis not present

## 2017-03-31 NOTE — Telephone Encounter (Signed)
err

## 2017-04-07 DIAGNOSIS — M25559 Pain in unspecified hip: Secondary | ICD-10-CM | POA: Diagnosis not present

## 2017-04-07 DIAGNOSIS — M545 Low back pain: Secondary | ICD-10-CM | POA: Diagnosis not present

## 2017-04-07 DIAGNOSIS — Z79891 Long term (current) use of opiate analgesic: Secondary | ICD-10-CM | POA: Diagnosis not present

## 2017-04-07 DIAGNOSIS — M25532 Pain in left wrist: Secondary | ICD-10-CM | POA: Diagnosis not present

## 2017-04-22 DIAGNOSIS — E119 Type 2 diabetes mellitus without complications: Secondary | ICD-10-CM | POA: Diagnosis not present

## 2017-04-22 DIAGNOSIS — E7800 Pure hypercholesterolemia, unspecified: Secondary | ICD-10-CM | POA: Diagnosis not present

## 2017-04-22 DIAGNOSIS — I10 Essential (primary) hypertension: Secondary | ICD-10-CM | POA: Diagnosis not present

## 2017-04-23 ENCOUNTER — Encounter (HOSPITAL_COMMUNITY): Payer: Self-pay | Admitting: *Deleted

## 2017-04-23 ENCOUNTER — Other Ambulatory Visit: Payer: Self-pay

## 2017-04-23 ENCOUNTER — Ambulatory Visit (HOSPITAL_COMMUNITY)
Admission: RE | Admit: 2017-04-23 | Discharge: 2017-04-23 | Disposition: A | Discharge status: Home / Self Care | Payer: Medicare Other | Source: Ambulatory Visit | Attending: Internal Medicine | Admitting: Internal Medicine

## 2017-04-23 ENCOUNTER — Encounter (HOSPITAL_COMMUNITY)
Admission: RE | Disposition: A | Discharge status: Home / Self Care | Payer: Self-pay | Source: Ambulatory Visit | Attending: Internal Medicine

## 2017-04-23 DIAGNOSIS — R1319 Other dysphagia: Secondary | ICD-10-CM | POA: Insufficient documentation

## 2017-04-23 DIAGNOSIS — K222 Esophageal obstruction: Secondary | ICD-10-CM | POA: Insufficient documentation

## 2017-04-23 DIAGNOSIS — K219 Gastro-esophageal reflux disease without esophagitis: Secondary | ICD-10-CM | POA: Diagnosis not present

## 2017-04-23 DIAGNOSIS — Z888 Allergy status to other drugs, medicaments and biological substances status: Secondary | ICD-10-CM | POA: Insufficient documentation

## 2017-04-23 DIAGNOSIS — K449 Diaphragmatic hernia without obstruction or gangrene: Secondary | ICD-10-CM | POA: Diagnosis not present

## 2017-04-23 DIAGNOSIS — F17210 Nicotine dependence, cigarettes, uncomplicated: Secondary | ICD-10-CM | POA: Diagnosis not present

## 2017-04-23 DIAGNOSIS — E119 Type 2 diabetes mellitus without complications: Secondary | ICD-10-CM | POA: Diagnosis not present

## 2017-04-23 DIAGNOSIS — K3189 Other diseases of stomach and duodenum: Secondary | ICD-10-CM | POA: Diagnosis not present

## 2017-04-23 DIAGNOSIS — Q394 Esophageal web: Secondary | ICD-10-CM | POA: Diagnosis not present

## 2017-04-23 DIAGNOSIS — Z87442 Personal history of urinary calculi: Secondary | ICD-10-CM | POA: Diagnosis not present

## 2017-04-23 DIAGNOSIS — Z79899 Other long term (current) drug therapy: Secondary | ICD-10-CM | POA: Insufficient documentation

## 2017-04-23 DIAGNOSIS — R933 Abnormal findings on diagnostic imaging of other parts of digestive tract: Secondary | ICD-10-CM | POA: Insufficient documentation

## 2017-04-23 DIAGNOSIS — Z7984 Long term (current) use of oral hypoglycemic drugs: Secondary | ICD-10-CM | POA: Diagnosis not present

## 2017-04-23 DIAGNOSIS — J189 Pneumonia, unspecified organism: Secondary | ICD-10-CM

## 2017-04-23 DIAGNOSIS — R1310 Dysphagia, unspecified: Secondary | ICD-10-CM | POA: Insufficient documentation

## 2017-04-23 DIAGNOSIS — X58XXXA Exposure to other specified factors, initial encounter: Secondary | ICD-10-CM | POA: Insufficient documentation

## 2017-04-23 DIAGNOSIS — Z794 Long term (current) use of insulin: Secondary | ICD-10-CM | POA: Diagnosis not present

## 2017-04-23 DIAGNOSIS — E7800 Pure hypercholesterolemia, unspecified: Secondary | ICD-10-CM | POA: Diagnosis not present

## 2017-04-23 DIAGNOSIS — T182XXA Foreign body in stomach, initial encounter: Secondary | ICD-10-CM | POA: Insufficient documentation

## 2017-04-23 DIAGNOSIS — I10 Essential (primary) hypertension: Secondary | ICD-10-CM | POA: Diagnosis not present

## 2017-04-23 DIAGNOSIS — R1314 Dysphagia, pharyngoesophageal phase: Secondary | ICD-10-CM | POA: Diagnosis not present

## 2017-04-23 HISTORY — PX: ESOPHAGOGASTRODUODENOSCOPY: SHX5428

## 2017-04-23 HISTORY — PX: ESOPHAGEAL DILATION: SHX303

## 2017-04-23 HISTORY — DX: Personal history of urinary calculi: Z87.442

## 2017-04-23 HISTORY — PX: BIOPSY: SHX5522

## 2017-04-23 HISTORY — DX: Pneumonia, unspecified organism: J18.9

## 2017-04-23 LAB — GLUCOSE, CAPILLARY: Glucose-Capillary: 165 mg/dL — ABNORMAL HIGH (ref 65–99)

## 2017-04-23 SURGERY — EGD (ESOPHAGOGASTRODUODENOSCOPY)
Anesthesia: Moderate Sedation

## 2017-04-23 MED ORDER — MIDAZOLAM HCL 5 MG/5ML IJ SOLN
INTRAMUSCULAR | Status: DC | PRN
  Administered 2017-04-23 (×6): 2 mg via INTRAVENOUS

## 2017-04-23 MED ORDER — MEPERIDINE HCL 50 MG/ML IJ SOLN
INTRAMUSCULAR | Status: AC
  Filled 2017-04-23: qty 1

## 2017-04-23 MED ORDER — LIDOCAINE VISCOUS 2 % MT SOLN
OROMUCOSAL | Status: AC
  Filled 2017-04-23: qty 15

## 2017-04-23 MED ORDER — STERILE WATER FOR IRRIGATION IR SOLN
Status: DC | PRN
  Administered 2017-04-23: 11:00:00

## 2017-04-23 MED ORDER — MIDAZOLAM HCL 5 MG/5ML IJ SOLN
INTRAMUSCULAR | Status: AC
  Filled 2017-04-23: qty 10

## 2017-04-23 MED ORDER — MIDAZOLAM HCL 5 MG/5ML IJ SOLN
INTRAMUSCULAR | Status: AC
  Filled 2017-04-23: qty 5

## 2017-04-23 MED ORDER — SODIUM CHLORIDE 0.9 % IV SOLN
INTRAVENOUS | Status: DC
  Administered 2017-04-23: 1000 mL via INTRAVENOUS

## 2017-04-23 MED ORDER — MEPERIDINE HCL 50 MG/ML IJ SOLN
INTRAMUSCULAR | Status: DC | PRN
  Administered 2017-04-23 (×4): 25 mg via INTRAVENOUS

## 2017-04-23 MED ORDER — LIDOCAINE VISCOUS 2 % MT SOLN
OROMUCOSAL | Status: DC | PRN
  Administered 2017-04-23: 4 mL via OROMUCOSAL

## 2017-04-23 NOTE — Discharge Instructions (Signed)
No aspirin or NSAIDs for 3 days. Resume usual medications as before. Clear liquids today. Gastroparesis/diabetic diet starting tomorrow. No driving for 24 hours. Physician will call with biopsy results.     Esophagogastroduodenoscopy, Care After Refer to this sheet in the next few weeks. These instructions provide you with information about caring for yourself after your procedure. Your health care provider may also give you more specific instructions. Your treatment has been planned according to current medical practices, but problems sometimes occur. Call your health care provider if you have any problems or questions after your procedure. What can I expect after the procedure? After the procedure, it is common to have:  A sore throat.  Nausea.  Bloating.  Dizziness.  Fatigue.  Follow these instructions at home:  Do not eat or drink anything until the numbing medicine (local anesthetic) has worn off and your gag reflex has returned. You will know that the local anesthetic has worn off when you can swallow comfortably.  Do not drive for 24 hours if you received a medicine to help you relax (sedative).  If your health care provider took a tissue sample for testing during the procedure, make sure to get your test results. This is your responsibility. Ask your health care provider or the department performing the test when your results will be ready.  Keep all follow-up visits as told by your health care provider. This is important. Contact a health care provider if:  You cannot stop coughing.  You are not urinating.  You are urinating less than usual. Get help right away if:  You have trouble swallowing.  You cannot eat or drink.  You have throat or chest pain that gets worse.  You are dizzy or light-headed.  You faint.  You have nausea or vomiting.  You have chills.  You have a fever.  You have severe abdominal pain.  You have black, tarry, or bloody  stools. This information is not intended to replace advice given to you by your health care provider. Make sure you discuss any questions you have with your health care provider. Document Released: 02/24/2012 Document Revised: 08/15/2015 Document Reviewed: 01/31/2015 Elsevier Interactive Patient Education  2018 [employer].      Gastroparesis Gastroparesis, also called delayed gastric emptying, is a condition in which food takes longer than normal to empty from the stomach. The condition is usually long-lasting (chronic). What are the causes? This condition may be caused by:  An endocrine disorder, such as hypothyroidism or diabetes. Diabetes is the most common cause of this condition.  A nervous system disease, such as Parkinson disease or multiple sclerosis.  Cancer, infection, or surgery of the stomach or vagus nerve.  A connective tissue disorder, such as scleroderma.  Certain medicines.  In most cases, the cause is not known. What increases the risk? This condition is more likely to develop in:  People with certain disorders, including endocrine disorders, eating disorders, amyloidosis, and scleroderma.  People with certain diseases, including Parkinson disease or multiple sclerosis.  People with cancer or infection of the stomach or vagus nerve.  People who have had surgery on the stomach or vagus nerve.  People who take certain medicines.  Women.  What are the signs or symptoms? Symptoms of this condition include:  An early feeling of fullness when eating.  Nausea.  Weight loss.  Vomiting.  Heartburn.  Abdominal bloating.  Inconsistent blood glucose levels.  Lack of appetite.  Acid from the stomach coming up into the  esophagus (gastroesophageal reflux).  Spasms of the stomach.  Symptoms may come and go. How is this diagnosed? This condition is diagnosed with tests, such as:  Tests that check how long it takes food to move through the stomach  and intestines. These tests include: ? Upper gastrointestinal (GI) series. In this test, X-rays of the intestines are taken after you drink a liquid. The liquid makes the intestines show up better on the X-rays. ? Gastric emptying scintigraphy. In this test, scans are taken after you eat food that contains a small amount of radioactive material. ? Wireless capsule GI monitoring system. This test involves swallowing a capsule that records information about movement through the stomach.  Gastric manometry. This test measures electrical and muscular activity in the stomach. It is done with a thin tube that is passed down the throat and into the stomach.  Endoscopy. This test checks for abnormalities in the lining of the stomach. It is done with a long, thin tube that is passed down the throat and into the stomach.  An ultrasound. This test can help rule out gallbladder disease or pancreatitis as a cause of your symptoms. It uses sound waves to take pictures of the inside of your body.  How is this treated? There is no cure for gastroparesis. This condition may be managed with:  Treatment of the underlying condition causing the gastroparesis.  Lifestyle changes, including exercise and dietary changes. Dietary changes can include: ? Changes in what and when you eat. ? Eating smaller meals more often. ? Eating low-fat foods. ? Eating low-fiber forms of high-fiber foods, such as cooked vegetables instead of raw vegetables. ? Having liquid foods in place of solid foods. Liquid foods are easier to digest.  Medicines. These may be given to control nausea and vomiting and to stimulate stomach muscles.  Getting food through a feeding tube. This may be done in severe cases.  A gastric neurostimulator. This is a device that is inserted into the body with surgery. It helps improve stomach emptying and control nausea and vomiting.  Follow these instructions at home:  Follow your health care provider's  instructions about exercise and diet.  Take medicines only as directed by your health care provider. Contact a health care provider if:  Your symptoms do not improve with treatment.  You have new symptoms. Get help right away if:  You have severe abdominal pain that does not improve with treatment.  You have nausea that does not go away.  You cannot keep fluids down. This information is not intended to replace advice given to you by your health care provider. Make sure you discuss any questions you have with your health care provider. Document Released: 03/09/2005 Document Revised: 08/15/2015 Document Reviewed: 03/05/2014 Elsevier Interactive Patient Education  2018 [employer].  additonal Gastroparesis dietary recommendations reviewed with patient and sister

## 2017-04-23 NOTE — Op Note (Signed)
[facility] Patient Name: Jason Woods Procedure Date: 04/23/2017 11:04 AM MRN: [id number] Date of Birth: 05/04/1945 Attending MD: Hildred Laser , MD CSN: [id number] Age: 72 Admit Type: Outpatient Procedure:                Upper GI endoscopy Indications:              Esophageal dysphagia, BPE. Providers:                Hildred Laser, MD, Jeanann Lewandowsky. Sharon Seller, RN, Randa Spike, Technician Referring MD:             Glenda Chroman, MD Medicines:                Lidocaine spray, Meperidine 100 mg IV, Midazolam 12                            mg IV Complications:            No immediate complications. Estimated Blood Loss:     Estimated blood loss was minimal. Procedure:                Pre-Anesthesia Assessment:                           - Prior to the procedure, a History and Physical                            was performed, and patient medications and                            allergies were reviewed. The patient's tolerance of                            previous anesthesia was also reviewed. The risks                            and benefits of the procedure and the sedation                            options and risks were discussed with the patient.                            All questions were answered, and informed consent                            was obtained. Prior Anticoagulants: The patient has                            taken no previous anticoagulant or antiplatelet                            agents. ASA Grade Assessment: II - A patient with  mild systemic disease. After reviewing the risks                            and benefits, the patient was deemed in                            satisfactory condition to undergo the procedure.                           After obtaining informed consent, the endoscope was                            passed under direct vision. Throughout the                            procedure, the  patient's blood pressure, pulse, and                            oxygen saturations were monitored continuously. The                            [id number]) was introduced through the mouth,                            and advanced to the second part of duodenum. The                            upper GI endoscopy was accomplished without                            difficulty. The patient tolerated the procedure                            well. Scope In: 11:25:31 AM Scope Out: 11:41:45 AM Total Procedure Duration: 0 hours 16 minutes 14 seconds  Findings:      A web was found in the proximal esophagus.      One mild benign-appearing, intrinsic stenosis was found 37 cm from the       incisors. And was traversed. The scope was withdrawn. Dilation was       performed with a Maloney dilator with mild resistance at 18 Fr and 56       Fr. The dilation site was examined following endoscope reinsertion and       showed moderate improvement in luminal narrowing and no perforation.      A 2 cm hiatal hernia was present.      A medium amount of food (residue) was found in the gastric fundus and in       the gastric body.      Patchy mucosal changes characterized by discoloration patches were found       in the prepyloric region of the stomach and at the pylorus. Biopsies       were taken with a cold forceps for histology.      The duodenal bulb and second portion of the duodenum were normal. Impression:               -  Web in the proximal esophagus.                           - Benign-appearing esophageal stenosis. Dilated.                           - 2 cm hiatal hernia.                           - A medium amount of food (residue) in the stomach.                            It was broken up with snare.                           - Discoloration patches mucosa in the prepyloric                            region of the stomach and pylorus suggestive of                            intestinal  metaplasia. Biopsied.                           - Normal duodenal bulb and second portion of the                            duodenum. Moderate Sedation:      Moderate (conscious) sedation was administered by the endoscopy nurse       and supervised by the endoscopist. The following parameters were       monitored: oxygen saturation, heart rate, blood pressure, CO2       capnography and response to care. Total physician intraservice time was       28 minutes. Recommendation:           - Patient has a contact number available for                            emergencies. The signs and symptoms of potential                            delayed complications were discussed with the                            patient. Return to normal activities tomorrow.                            Written discharge instructions were provided to the                            patient.                           - Clear liquid diet today.                           -  Diabetic (ADA) diet and gastroparesis diet for 1                            day.                           - Continue present medications.                           - No aspirin, ibuprofen, naproxen, or other                            non-steroidal anti-inflammatory drugs for 3 days.                           - Await pathology results. Procedure Code(s):        --- Professional ---                           [phone], Esophagogastroduodenoscopy, flexible,                            transoral; with biopsy, single or multiple                           43450, Dilation of esophagus, by unguided sound or                            bougie, single or multiple passes                           99152, Moderate sedation services provided by the                            same physician or other qualified health care                            professional performing the diagnostic or                            therapeutic service that the sedation supports,                             requiring the presence of an independent trained                            observer to assist in the monitoring of the                            patient's level of consciousness and physiological                            status; initial 15 minutes of intraservice time,                            patient age  5 years or older                           [phone], Moderate sedation services; each additional                            15 minutes intraservice time Diagnosis Code(s):        --- Professional ---                           Q39.4, Esophageal web                           K22.2, Esophageal obstruction                           K44.9, Diaphragmatic hernia without obstruction or                            gangrene                           R13.14, Dysphagia, pharyngoesophageal phase CPT copyright 2016 American Medical Association. All rights reserved. The codes documented in this report are preliminary and upon coder review may  be revised to meet current compliance requirements. Hildred Laser, MD Hildred Laser, MD 04/23/2017 11:54:11 AM This report has been signed electronically. Number of Addenda: 0

## 2017-04-23 NOTE — H&P (Signed)
Jason Woods is an 72 y.o. male.   Chief Complaint: Patient is here for EGD and ED. HPI: Patient is 72 year old Caucasian male who has history of GERD who is been having intermittent solid food dysphagia for the last 7-8 months.  He points to the midsternal area as site of bolus obstruction.  He states he was seen at [facility] once a few months ago for foreign body impaction and had EGD.  Esophagus was not dilated.  He has no difficulty with liquids.  He denies abdominal pain melena or weight loss.  He says heartburn is generally well controlled with pantoprazole but every now and then he may have spells.  He does not take aspirin or other NSAIDs. He underwent barium pill study a few weeks ago.  It revealed a tiny esophageal web and a narrowing at GE junction and a small hiatal hernia.  It is also felt that he had esophageal dysmotility.  Past Medical History:  Diagnosis Date  . Diabetes ([place])   . Diabetes ([place]) 03/09/2017  . GERD (gastroesophageal reflux disease)   . High cholesterol   . History of kidney stones   . Hypertension   . Vitamin B deficiency 03/09/2017    Past Surgical History:  Procedure Laterality Date  . left hand surgery     wrist fx about 5-6 months ago (2018)    Family History  Problem Relation Age of Onset  . Diabetes Mother   . Heart attack Father   . Diabetes Sister   . Diabetes Brother    Social History:  reports that he has been smoking cigarettes.  he has never used smokeless tobacco. He reports that he does not drink alcohol or use drugs.  Allergies:  Allergies  Allergen Reactions  . Celexa [Citalopram Hydrobromide]     Nausea,blurred vision  . Crestor [Rosuvastatin]     Pain, aching muscles  . Lipitor [Atorvastatin]     Aching, MS  . Mobic [Meloxicam]     nausea  . Prozac [Fluoxetine Hcl]     fatigue  . Zocor [Simvastatin-High Dose]     MS aches    Medications Prior to Admission  Medication Sig Dispense Refill  . acetaminophen (TYLENOL) 500  MG tablet Take 1,000 mg by mouth every 8 (eight) hours as needed for mild pain or headache.    . ALPRAZolam (XANAX) 1 MG tablet Take 1 mg by mouth 2 (two) times daily.     [name] amLODipine (NORVASC) 5 MG tablet Take 5 mg by mouth daily.    [name] b complex vitamins tablet Take 1 tablet by mouth daily.    . bimatoprost (LUMIGAN) 0.01 % SOLN Place 1 drop into the right eye at bedtime.    . Cholecalciferol (D 2000) 2000 units TABS Take 2,000 Units by mouth daily.    . cyanocobalamin 2000 MCG tablet Take 2,000 mcg by mouth daily.    . fluticasone (FLONASE) 50 MCG/ACT nasal spray Place 2 sprays into both nostrils daily.     [name] gabapentin (NEURONTIN) 300 MG capsule Take 300 mg by mouth 2 (two) times daily.    . hydrochlorothiazide (HYDRODIURIL) 25 MG tablet Take 25 mg by mouth daily.    . hydrocortisone cream 1 % Apply 1 application topically daily as needed for itching (dry skin).    . Insulin NPH Human, Isophane, (NOVOLIN N [place]) Inject 10-35 Units into the skin 2 (two) times daily. 35 units in am and 10 units in pm    .  losartan (COZAAR) 100 MG tablet Take 100 mg by mouth daily.    . metFORMIN (GLUCOPHAGE) 850 MG tablet Take 850 mg by mouth 2 (two) times daily with a meal.    . metoprolol succinate (TOPROL-XL) 50 MG 24 hr tablet Take 50 mg by mouth daily. Take with or immediately following a meal.    . Multiple Vitamin (MULTIVITAMIN WITH MINERALS) TABS tablet Take 1 tablet by mouth daily.    [name] oxyCODONE-acetaminophen (PERCOCET) 7.5-325 MG tablet Take 1 tablet by mouth 2 (two) times daily.     . pantoprazole (PROTONIX) 40 MG tablet Take 40 mg by mouth daily.    [name] Glycol-Propyl Glycol (SYSTANE OP) Apply 2 drops to eye 2 (two) times daily.    . rosuvastatin (CRESTOR) 5 MG tablet Take 5 mg by mouth See admin instructions. Only takes 1 time month because it makes his legs and muscles hurt      No results found for this or any previous visit (from the past 48 hour(s)). No results found.  ROS  Blood  pressure (!) 147/70, pulse 80, temperature (!) 97.5 F (36.4 C), temperature source Oral, resp. rate 11, height 5\' 7"  (1.702 m), weight 174 lb (78.9 kg), SpO2 99 %. Physical Exam  Constitutional: He appears well-developed and well-nourished.  HENT:  Mouth/Throat: Oropharynx is clear and moist.  He has few remaining teeth in upper and lower jaw.  Most are missing.  Eyes: Conjunctivae are normal. No scleral icterus.  Neck: No thyromegaly present.  Cardiovascular: Normal rate, regular rhythm and normal heart sounds.  No murmur heard. Respiratory: Effort normal and breath sounds normal.  GI: Soft. He exhibits no distension and no mass. There is no tenderness.  Musculoskeletal: He exhibits no edema.  Neurological: He is alert.  Skin: Skin is warm and dry.     Assessment/Plan Solid food dysphagia in patient with chronic GERD. Abnormal barium pill esophagogram. EGD with ED.  Hildred Laser, MD 04/23/2017, 11:07 AM

## 2017-04-26 ENCOUNTER — Encounter (HOSPITAL_COMMUNITY): Payer: Self-pay | Admitting: Internal Medicine

## 2017-04-26 DIAGNOSIS — Z6827 Body mass index (BMI) 27.0-27.9, adult: Secondary | ICD-10-CM | POA: Diagnosis not present

## 2017-04-26 DIAGNOSIS — I10 Essential (primary) hypertension: Secondary | ICD-10-CM | POA: Diagnosis not present

## 2017-04-26 DIAGNOSIS — E11319 Type 2 diabetes mellitus with unspecified diabetic retinopathy without macular edema: Secondary | ICD-10-CM | POA: Diagnosis not present

## 2017-04-26 DIAGNOSIS — J069 Acute upper respiratory infection, unspecified: Secondary | ICD-10-CM | POA: Diagnosis not present

## 2017-04-26 DIAGNOSIS — Z299 Encounter for prophylactic measures, unspecified: Secondary | ICD-10-CM | POA: Diagnosis not present

## 2017-04-29 ENCOUNTER — Emergency Department (HOSPITAL_COMMUNITY): Payer: Medicare Other

## 2017-04-29 ENCOUNTER — Inpatient Hospital Stay (HOSPITAL_COMMUNITY)
Admission: EM | Admit: 2017-04-29 | Discharge: 2017-04-30 | DRG: 195 | Disposition: A | Discharge status: Home / Self Care | Payer: Medicare Other | Attending: Internal Medicine | Admitting: Internal Medicine

## 2017-04-29 ENCOUNTER — Encounter (HOSPITAL_COMMUNITY): Payer: Self-pay | Admitting: Emergency Medicine

## 2017-04-29 ENCOUNTER — Other Ambulatory Visit: Payer: Self-pay

## 2017-04-29 DIAGNOSIS — E539 Vitamin B deficiency, unspecified: Secondary | ICD-10-CM | POA: Diagnosis present

## 2017-04-29 DIAGNOSIS — Z7984 Long term (current) use of oral hypoglycemic drugs: Secondary | ICD-10-CM | POA: Diagnosis not present

## 2017-04-29 DIAGNOSIS — E7800 Pure hypercholesterolemia, unspecified: Secondary | ICD-10-CM | POA: Diagnosis present

## 2017-04-29 DIAGNOSIS — R0902 Hypoxemia: Secondary | ICD-10-CM | POA: Diagnosis present

## 2017-04-29 DIAGNOSIS — I10 Essential (primary) hypertension: Secondary | ICD-10-CM | POA: Diagnosis present

## 2017-04-29 DIAGNOSIS — R509 Fever, unspecified: Secondary | ICD-10-CM | POA: Diagnosis not present

## 2017-04-29 DIAGNOSIS — F17210 Nicotine dependence, cigarettes, uncomplicated: Secondary | ICD-10-CM | POA: Diagnosis present

## 2017-04-29 DIAGNOSIS — Z79899 Other long term (current) drug therapy: Secondary | ICD-10-CM

## 2017-04-29 DIAGNOSIS — Z833 Family history of diabetes mellitus: Secondary | ICD-10-CM

## 2017-04-29 DIAGNOSIS — Z87442 Personal history of urinary calculi: Secondary | ICD-10-CM

## 2017-04-29 DIAGNOSIS — R739 Hyperglycemia, unspecified: Secondary | ICD-10-CM | POA: Diagnosis not present

## 2017-04-29 DIAGNOSIS — J181 Lobar pneumonia, unspecified organism: Secondary | ICD-10-CM | POA: Diagnosis not present

## 2017-04-29 DIAGNOSIS — R05 Cough: Secondary | ICD-10-CM | POA: Diagnosis not present

## 2017-04-29 DIAGNOSIS — K219 Gastro-esophageal reflux disease without esophagitis: Secondary | ICD-10-CM | POA: Diagnosis present

## 2017-04-29 DIAGNOSIS — Z888 Allergy status to other drugs, medicaments and biological substances status: Secondary | ICD-10-CM | POA: Diagnosis not present

## 2017-04-29 DIAGNOSIS — R0602 Shortness of breath: Secondary | ICD-10-CM | POA: Diagnosis not present

## 2017-04-29 DIAGNOSIS — H548 Legal blindness, as defined in USA: Secondary | ICD-10-CM | POA: Diagnosis present

## 2017-04-29 DIAGNOSIS — E119 Type 2 diabetes mellitus without complications: Secondary | ICD-10-CM | POA: Diagnosis present

## 2017-04-29 DIAGNOSIS — J189 Pneumonia, unspecified organism: Secondary | ICD-10-CM | POA: Diagnosis present

## 2017-04-29 DIAGNOSIS — Z8249 Family history of ischemic heart disease and other diseases of the circulatory system: Secondary | ICD-10-CM | POA: Diagnosis not present

## 2017-04-29 DIAGNOSIS — R1111 Vomiting without nausea: Secondary | ICD-10-CM | POA: Diagnosis not present

## 2017-04-29 DIAGNOSIS — M546 Pain in thoracic spine: Secondary | ICD-10-CM | POA: Diagnosis not present

## 2017-04-29 DIAGNOSIS — R531 Weakness: Secondary | ICD-10-CM | POA: Diagnosis not present

## 2017-04-29 HISTORY — DX: Legal blindness, as defined in USA: H54.8

## 2017-04-29 LAB — CBC
HEMATOCRIT: 38.3 % — AB (ref 39.0–52.0)
Hemoglobin: 12.7 g/dL — ABNORMAL LOW (ref 13.0–17.0)
MCH: 28.7 pg (ref 26.0–34.0)
MCHC: 33.2 g/dL (ref 30.0–36.0)
MCV: 86.5 fL (ref 78.0–100.0)
PLATELETS: 245 10*3/uL (ref 150–400)
RBC: 4.43 MIL/uL (ref 4.22–5.81)
RDW: 13.4 % (ref 11.5–15.5)
WBC: 19.7 10*3/uL — AB (ref 4.0–10.5)

## 2017-04-29 LAB — BASIC METABOLIC PANEL
ANION GAP: 13 (ref 5–15)
BUN: 19 mg/dL (ref 6–20)
CHLORIDE: 93 mmol/L — AB (ref 101–111)
CO2: 24 mmol/L (ref 22–32)
Calcium: 8.2 mg/dL — ABNORMAL LOW (ref 8.9–10.3)
Creatinine, Ser: 1.08 mg/dL (ref 0.61–1.24)
GFR calc non Af Amer: >60 mL/min (ref >60)
Glucose, Bld: 275 mg/dL — ABNORMAL HIGH (ref 65–99)
POTASSIUM: 3.2 mmol/L — AB (ref 3.5–5.1)
SODIUM: 130 mmol/L — AB (ref 135–145)

## 2017-04-29 LAB — URINALYSIS, ROUTINE W REFLEX MICROSCOPIC
Bacteria, UA: NONE SEEN
Bilirubin Urine: NEGATIVE
Glucose, UA: 50 mg/dL — AB
Hgb urine dipstick: NEGATIVE
Ketones, ur: 20 mg/dL — AB
Leukocytes, UA: NEGATIVE
Nitrite: NEGATIVE
PH: 6 (ref 5.0–8.0)
Protein, ur: 30 mg/dL — AB
Specific Gravity, Urine: 1.018 (ref 1.005–1.030)

## 2017-04-29 LAB — GLUCOSE, CAPILLARY: Glucose-Capillary: 232 mg/dL — ABNORMAL HIGH (ref 65–99)

## 2017-04-29 LAB — CBG MONITORING, ED
Glucose-Capillary: 268 mg/dL — ABNORMAL HIGH (ref 65–99)
Glucose-Capillary: 270 mg/dL — ABNORMAL HIGH (ref 65–99)

## 2017-04-29 MED ORDER — ALPRAZOLAM 1 MG PO TABS
1.0000 mg | ORAL_TABLET | Freq: Once | ORAL | Status: AC
  Administered 2017-04-29: 1 mg via ORAL
  Filled 2017-04-29: qty 1

## 2017-04-29 MED ORDER — ENOXAPARIN SODIUM 40 MG/0.4ML ~~LOC~~ SOLN
40.0000 mg | SUBCUTANEOUS | Status: DC
  Administered 2017-04-29: 40 mg via SUBCUTANEOUS
  Filled 2017-04-29: qty 0.4

## 2017-04-29 MED ORDER — ONDANSETRON HCL 4 MG/2ML IJ SOLN
4.0000 mg | Freq: Once | INTRAMUSCULAR | Status: AC
  Administered 2017-04-29: 4 mg via INTRAVENOUS
  Filled 2017-04-29: qty 2

## 2017-04-29 MED ORDER — LEVOFLOXACIN IN D5W 750 MG/150ML IV SOLN
750.0000 mg | INTRAVENOUS | Status: DC
  Administered 2017-04-29: 750 mg via INTRAVENOUS

## 2017-04-29 MED ORDER — OXYCODONE-ACETAMINOPHEN 7.5-325 MG PO TABS
1.0000 | ORAL_TABLET | Freq: Once | ORAL | Status: AC
  Administered 2017-04-29: 1 via ORAL
  Filled 2017-04-29: qty 1

## 2017-04-29 MED ORDER — INSULIN ASPART 100 UNIT/ML ~~LOC~~ SOLN
0.0000 [IU] | Freq: Three times a day (TID) | SUBCUTANEOUS | Status: DC
  Administered 2017-04-29: 5 [IU] via SUBCUTANEOUS
  Administered 2017-04-30 (×2): 3 [IU] via SUBCUTANEOUS
  Filled 2017-04-29: qty 1

## 2017-04-29 MED ORDER — SODIUM CHLORIDE 0.9 % IV BOLUS (SEPSIS)
500.0000 mL | Freq: Once | INTRAVENOUS | Status: AC
  Administered 2017-04-29: 500 mL via INTRAVENOUS

## 2017-04-29 MED ORDER — LEVOFLOXACIN IN D5W 750 MG/150ML IV SOLN
750.0000 mg | Freq: Once | INTRAVENOUS | Status: AC
  Administered 2017-04-29: 750 mg via INTRAVENOUS
  Filled 2017-04-29: qty 150

## 2017-04-29 MED ORDER — POTASSIUM CHLORIDE IN NACL 20-0.9 MEQ/L-% IV SOLN
INTRAVENOUS | Status: AC
  Administered 2017-04-29: 16:00:00 via INTRAVENOUS
  Filled 2017-04-29: qty 1000

## 2017-04-29 NOTE — ED Provider Notes (Signed)
[facility] EMERGENCY DEPARTMENT Provider Note   CSN: [id number] Arrival date & time: 04/29/17  1326     History   Chief Complaint Chief Complaint  Patient presents with  . Weakness    HPI Jason Woods is a 72 y.o. male.  HPI Presents with generalized weakness, productive cough worsening over the last few days.  Was seen by his primary physician and started on prednisone and Z-Pak for sinus infection.  Patient is he has had subjective fevers and chills.  Denies any chest pain but does have some upper thoracic back pain.  Denies any new lower extremity swelling or pain. Past Medical History:  Diagnosis Date  . Diabetes ([place])   . Diabetes ([place]) 03/09/2017  . GERD (gastroesophageal reflux disease)   . High cholesterol   . History of kidney stones   . Hypertension   . Legally blind   . Vitamin B deficiency 03/09/2017    Patient Active Problem List   Diagnosis Date Noted  . Esophageal dysphagia 03/31/2017  . Abnormal esophagram 03/31/2017  . Diabetes ([place]) 03/09/2017  . Vitamin B deficiency 03/09/2017  . High cholesterol   . Hypertension   . GERD (gastroesophageal reflux disease)     Past Surgical History:  Procedure Laterality Date  . BIOPSY  04/23/2017   Procedure: BIOPSY;  Surgeon: Rogene Houston, MD;  [facility];  Service: Endoscopy;;  gastric  . ESOPHAGEAL DILATION N/A 04/23/2017   Procedure: ESOPHAGEAL DILATION;  Surgeon: Rogene Houston, MD;  [facility];  Service: Endoscopy;  Laterality: N/A;  . ESOPHAGOGASTRODUODENOSCOPY N/A 04/23/2017   Procedure: ESOPHAGOGASTRODUODENOSCOPY (EGD);  Surgeon: Rogene Houston, MD;  [facility];  Service: Endoscopy;  Laterality: N/A;  9:55-moved to 1040 per Lelon Frohlich  . left hand surgery     wrist fx about 5-6 months ago (2018)       Home Medications    Prior to Admission medications   Medication Sig Start Date End Date Taking? Authorizing Provider  acetaminophen (TYLENOL) 500 MG tablet  Take 1,000 mg by mouth every 8 (eight) hours as needed for mild pain or headache.    [provider]  ALPRAZolam Duanne Moron) 1 MG tablet Take 1 mg by mouth 2 (two) times daily.     [provider]  amLODipine (NORVASC) 5 MG tablet Take 5 mg by mouth daily.    [provider]  b complex vitamins tablet Take 1 tablet by mouth daily.    [provider]  bimatoprost (LUMIGAN) 0.01 % SOLN Place 1 drop into the right eye at bedtime.    [provider]  Cholecalciferol (D 2000) 2000 units TABS Take 2,000 Units by mouth daily.    [provider]  cyanocobalamin 2000 MCG tablet Take 2,000 mcg by mouth daily.    [provider]  fluticasone (FLONASE) 50 MCG/ACT nasal spray Place 2 sprays into both nostrils daily.     [provider]  gabapentin (NEURONTIN) 300 MG capsule Take 300 mg by mouth 2 (two) times daily.    [provider]  hydrochlorothiazide (HYDRODIURIL) 25 MG tablet Take 25 mg by mouth daily.    [provider]  hydrocortisone cream 1 % Apply 1 application topically daily as needed for itching (dry skin).    [provider]  Insulin NPH Human, Isophane, (NOVOLIN N [place]) Inject 10-35 Units into the skin 2 (two) times daily. 35 units in am and 10 units in pm  [provider]  losartan (COZAAR) 100 MG tablet Take 100 mg by mouth daily.    [provider]  metFORMIN (GLUCOPHAGE) 850 MG tablet Take 850 mg by mouth 2 (two) times daily with a meal.    [provider]  metoprolol succinate (TOPROL-XL) 50 MG 24 hr tablet Take 50 mg by mouth daily. Take with or immediately following a meal.    [provider]  Multiple Vitamin (MULTIVITAMIN WITH MINERALS) TABS tablet Take 1 tablet by mouth daily.    [provider]  oxyCODONE-acetaminophen (PERCOCET) 7.5-325 MG tablet Take 1 tablet by mouth 2 (two) times daily.     [provider]  pantoprazole (PROTONIX) 40 MG  tablet Take 40 mg by mouth daily.    [provider]  Polyethyl Glycol-Propyl Glycol (SYSTANE OP) Apply 2 drops to eye 2 (two) times daily.    [provider]  rosuvastatin (CRESTOR) 5 MG tablet Take 5 mg by mouth See admin instructions. Only takes 1 time month because it makes his legs and muscles hurt    [provider]    Family History Family History  Problem Relation Age of Onset  . Diabetes Mother   . Heart attack Father   . Diabetes Sister   . Diabetes Brother     Social History Social History   Tobacco Use  . Smoking status: Current Every Day Smoker    Packs/day: 0.50    Types: Cigarettes  . Smokeless tobacco: Never Used  . Tobacco comment: 1 pack a day x 60 yrs  Substance Use Topics  . Alcohol use: No    Frequency: Never  . Drug use: No     Allergies   Celexa [citalopram hydrobromide]; Crestor [rosuvastatin]; Lipitor [atorvastatin]; Mobic [meloxicam]; Prozac [fluoxetine hcl]; and Zocor [simvastatin-high dose]   Review of Systems Review of Systems  Constitutional: Positive for chills, fatigue and fever.  Respiratory: Positive for cough and shortness of breath.   Cardiovascular: Negative for chest pain, palpitations and leg swelling.  Gastrointestinal: Negative for abdominal pain, diarrhea, nausea and vomiting.  Genitourinary: Negative for dysuria, flank pain and frequency.  Musculoskeletal: Positive for back pain and myalgias. Negative for arthralgias and neck pain.  Skin: Negative for rash and wound.  Neurological: Negative for dizziness, weakness, light-headedness, numbness and headaches.  All other systems reviewed and are negative.    Physical Exam Updated Vital Signs BP 125/66 (BP Location: Left Arm)   Pulse 100   Temp 99.2 F (37.3 C) (Oral)   Resp (!) 26   Ht 5\' 7"  (1.702 m)   Wt 78.9 kg (174 lb)   SpO2 93%   BMI 27.25 kg/m   Physical Exam  Constitutional: He is oriented to person, place, and time. He appears  well-developed and well-nourished.  HENT:  Head: Normocephalic and atraumatic.  Mouth/Throat: Oropharynx is clear and moist.  Eyes: EOM are normal. Pupils are equal, round, and reactive to light.  Neck: Normal range of motion. Neck supple.  Cardiovascular: Normal rate and regular rhythm. Exam reveals no gallop and no friction rub.  No murmur heard. Pulmonary/Chest: Effort normal. He has rales.  Scattered rhonchi especially in the right lung field.  Abdominal: Soft. Bowel sounds are normal. There is no tenderness. There is no rebound and no guarding.  Musculoskeletal: Normal range of motion. He exhibits no edema or tenderness.  No lower extremity swelling, asymmetry or tenderness.  Distal pulses are 2+.  Neurological: He is alert and oriented to person, place,  and time.  Moves all extremities without focal weakness.  Sensation intact.  Skin: Skin is warm and dry. Capillary refill takes less than 2 seconds. No rash noted. No erythema.  Psychiatric: He has a normal mood and affect. His behavior is normal.  Nursing note and vitals reviewed.    ED Treatments / Results  Labs (all labs ordered are listed, but only abnormal results are displayed) Labs Reviewed  BASIC METABOLIC PANEL - Abnormal; Notable for the following components:      Result Value   Sodium 130 (*)    Potassium 3.2 (*)    Chloride 93 (*)    Glucose, Bld 275 (*)    Calcium 8.2 (*)    All other components within normal limits  CBC - Abnormal; Notable for the following components:   WBC 19.7 (*)    Hemoglobin 12.7 (*)    HCT 38.3 (*)    All other components within normal limits  URINALYSIS, ROUTINE W REFLEX MICROSCOPIC - Abnormal; Notable for the following components:   Color, Urine AMBER (*)    APPearance HAZY (*)    Glucose, UA 50 (*)    Ketones, ur 20 (*)    Protein, ur 30 (*)    Squamous Epithelial / LPF 0-5 (*)    All other components within normal limits  CBG MONITORING, ED - Abnormal; Notable for the  following components:   Glucose-Capillary 268 (*)    All other components within normal limits    EKG  EKG Interpretation None       Radiology Dg Chest 2 View  Result Date: 04/29/2017 CLINICAL DATA:  Cough and congestion. EXAM: CHEST  2 VIEW COMPARISON:  Chest x-ray dated January 22, 2014. FINDINGS: The heart size and mediastinal contours are within normal limits. Normal pulmonary vascularity. Patchy density in the periphery of the right lower lobe on the frontal view. Linear atelectasis/scarring right upper lobe. The left lung is clear. No pleural effusion or pneumothorax. No acute osseous abnormality. IMPRESSION: Patchy opacities in the periphery of the right lower lobe on the lateral view, suspicious for pneumonia. Followup PA and lateral chest X-ray is recommended in 3-4 weeks following trial of antibiotic therapy to ensure resolution and exclude underlying malignancy. Electronically Signed   By: Titus Dubin M.D.   On: 04/29/2017 14:31    Procedures Procedures (including critical care time)  Medications Ordered in ED Medications  levofloxacin (LEVAQUIN) IVPB 750 mg (750 mg Intravenous New Bag/Given 04/29/17 1511)  sodium chloride 0.9 % bolus 500 mL (500 mLs Intravenous New Bag/Given 04/29/17 1457)     Initial Impression / Assessment and Plan / ED Course  I have reviewed the triage vital signs and the nursing notes.  Pertinent labs & imaging results that were available during my care of the patient were reviewed by me and considered in my medical decision making (see chart for details).     Will start on antibiotics for community acquired pneumonia.  Patient saturations in the low 90s while sitting in bed.  Discussed with hospitalist who will see patient in the department and admit.  Final Clinical Impressions(s) / ED Diagnoses   Final diagnoses:  Community acquired pneumonia of right lower lobe of lung [facility])    ED Discharge Orders    None       Julianne Rice,  MD 04/29/17 1517

## 2017-04-29 NOTE — ED Notes (Signed)
Report given to Lauren, RN.

## 2017-04-29 NOTE — ED Notes (Signed)
Pharmacy contacted for IV fluids

## 2017-04-29 NOTE — ED Notes (Signed)
Patient states he has had the flu vaccine this year and has had 2 pneumonia vaccines.

## 2017-04-29 NOTE — ED Notes (Signed)
Only 1 dose of Levaquin given in the ED. Scanner not working. Duplicate entry. Patient name, DOB, and MRN verified.

## 2017-04-29 NOTE — ED Triage Notes (Signed)
Patient states he woke yesterday evening and started having episodes of emesis. Was seen by PCP on Monday or Tuesday and given prednisone and z-pack for a sinus infection.

## 2017-04-29 NOTE — H&P (Signed)
History and Physical    Jason Woods HYQ:[id number] DOB: 01/02/1946 DOA: 04/29/2017  PCP: Glenda Chroman, MD  Patient coming from: Home  Chief Complaint: Shortness of breath cough  HPI: Jason Woods is a 72 y.o. male with medical history significant of diabetes, hypertension, GERD comes in with over a week of coughing and shortness of breath at home.  Patient denies any chest pain.  He has been having some chills.  He went to see his physician on Monday which was 4 days ago was started on prednisone and Z-Pak.  He has not felt better with this.  He has been having a lot of nasal congestion.  Denies any lower extremity edema.  Patient denies any choking or coughing normally when he eats.  He is not been feeling well for the last several days.  Patient found to have pneumonia referred for admission for pneumonia and hypoxia.  His O2 sats around 92% on room air.   Review of Systems: As per HPI otherwise 10 point review of systems negative.   Past Medical History:  Diagnosis Date  . Diabetes ([place])   . Diabetes ([place]) 03/09/2017  . GERD (gastroesophageal reflux disease)   . High cholesterol   . History of kidney stones   . Hypertension   . Legally blind   . Vitamin B deficiency 03/09/2017    Past Surgical History:  Procedure Laterality Date  . BIOPSY  04/23/2017   Procedure: BIOPSY;  Surgeon: Rogene Houston, MD;  [facility];  Service: Endoscopy;;  gastric  . ESOPHAGEAL DILATION N/A 04/23/2017   Procedure: ESOPHAGEAL DILATION;  Surgeon: Rogene Houston, MD;  [facility];  Service: Endoscopy;  Laterality: N/A;  . ESOPHAGOGASTRODUODENOSCOPY N/A 04/23/2017   Procedure: ESOPHAGOGASTRODUODENOSCOPY (EGD);  Surgeon: Rogene Houston, MD;  [facility];  Service: Endoscopy;  Laterality: N/A;  9:55-moved to 1040 per Lelon Frohlich  . left hand surgery     wrist fx about 5-6 months ago (2018)     reports that he has been smoking cigarettes.  He has been smoking  about 0.50 packs per day. he has never used smokeless tobacco. He reports that he does not drink alcohol or use drugs.  Allergies  Allergen Reactions  . Celexa [Citalopram Hydrobromide]     Nausea,blurred vision  . Crestor [Rosuvastatin]     Pain, aching muscles  . Lipitor [Atorvastatin]     Aching, MS  . Mobic [Meloxicam]     nausea  . Prozac [Fluoxetine Hcl]     fatigue  . Zocor [Simvastatin-High Dose]     MS aches    Family History  Problem Relation Age of Onset  . Diabetes Mother   . Heart attack Father   . Diabetes Sister   . Diabetes Brother     Prior to Admission medications   Medication Sig Start Date End Date Taking? Authorizing Provider  acetaminophen (TYLENOL) 500 MG tablet Take 1,000 mg by mouth every 8 (eight) hours as needed for mild pain or headache.    [provider]  ALPRAZolam Duanne Moron) 1 MG tablet Take 1 mg by mouth 2 (two) times daily.     [provider]  amLODipine (NORVASC) 5 MG tablet Take 5 mg by mouth daily.    [provider]  b complex vitamins tablet Take 1 tablet by mouth daily.    [provider]  bimatoprost (LUMIGAN) 0.01 % SOLN Place 1 drop into the right eye at  bedtime.    [provider]  Cholecalciferol (D 2000) 2000 units TABS Take 2,000 Units by mouth daily.    [provider]  cyanocobalamin 2000 MCG tablet Take 2,000 mcg by mouth daily.    [provider]  fluticasone (FLONASE) 50 MCG/ACT nasal spray Place 2 sprays into both nostrils daily.     [provider]  gabapentin (NEURONTIN) 300 MG capsule Take 300 mg by mouth 2 (two) times daily.    [provider]  hydrochlorothiazide (HYDRODIURIL) 25 MG tablet Take 25 mg by mouth daily.    [provider]  hydrocortisone cream 1 % Apply 1 application topically daily as needed for itching (dry skin).    [provider]  Insulin NPH Human, Isophane, (NOVOLIN N [place]) Inject 10-35 Units into the skin 2  (two) times daily. 35 units in am and 10 units in pm    [provider]  losartan (COZAAR) 100 MG tablet Take 100 mg by mouth daily.    [provider]  metFORMIN (GLUCOPHAGE) 850 MG tablet Take 850 mg by mouth 2 (two) times daily with a meal.    [provider]  metoprolol succinate (TOPROL-XL) 50 MG 24 hr tablet Take 50 mg by mouth daily. Take with or immediately following a meal.    [provider]  Multiple Vitamin (MULTIVITAMIN WITH MINERALS) TABS tablet Take 1 tablet by mouth daily.    [provider]  oxyCODONE-acetaminophen (PERCOCET) 7.5-325 MG tablet Take 1 tablet by mouth 2 (two) times daily.     [provider]  pantoprazole (PROTONIX) 40 MG tablet Take 40 mg by mouth daily.    [provider]  Polyethyl Glycol-Propyl Glycol (SYSTANE OP) Apply 2 drops to eye 2 (two) times daily.    [provider]  rosuvastatin (CRESTOR) 5 MG tablet Take 5 mg by mouth See admin instructions. Only takes 1 time month because it makes his legs and muscles hurt    [provider]    Physical Exam: Vitals:   04/29/17 1337 04/29/17 1338 04/29/17 1514  BP: 125/66    Pulse: 100    Resp: (!) 26    Temp: 98.7 F (37.1 C)  99.2 F (37.3 C)  TempSrc: Oral  Oral  SpO2: 93%    Weight:  78.9 kg (174 lb)   Height:  5\' 7"  (1.702 m)      Constitutional: NAD, calm, comfortable Vitals:   04/29/17 1337 04/29/17 1338 04/29/17 1514  BP: 125/66    Pulse: 100    Resp: (!) 26    Temp: 98.7 F (37.1 C)  99.2 F (37.3 C)  TempSrc: Oral  Oral  SpO2: 93%    Weight:  78.9 kg (174 lb)   Height:  5\' 7"  (1.702 m)    Eyes: PERRL, lids and conjunctivae normal ENMT: Mucous membranes are moist. Posterior pharynx clear of any exudate or lesions.Normal dentition.  Neck: normal, supple, no masses, no thyromegaly Respiratory: clear to auscultation bilaterally, no wheezing, no crackles. Normal respiratory effort. No accessory muscle use.    Cardiovascular: Regular rate and rhythm, no murmurs / rubs / gallops. No extremity edema. 2+ pedal pulses. No carotid bruits.  Abdomen: no tenderness, no masses palpated. No hepatosplenomegaly. Bowel sounds positive.  Musculoskeletal: no clubbing / cyanosis. No joint deformity upper and lower extremities. Good ROM, no contractures. Normal muscle tone.  Skin: no rashes, lesions, ulcers. No induration Neurologic: CN 2-12 grossly intact. Sensation intact, DTR normal. Strength 5/5  in all 4.  Psychiatric: Normal judgment and insight. Alert and oriented x 3. Normal mood.    Labs on Admission: I have personally reviewed following labs and imaging studies  CBC: Recent Labs  Lab 04/29/17 1416  WBC 19.7*  HGB 12.7*  HCT 38.3*  MCV 86.5  PLT 628   Basic Metabolic Panel: Recent Labs  Lab 04/29/17 1416  NA 130*  K 3.2*  CL 93*  CO2 24  GLUCOSE 275*  BUN 19  CREATININE 1.08  CALCIUM 8.2*   GFR: Estimated Creatinine Clearance: 57.8 mL/min (by C-G formula based on SCr of 1.08 mg/dL). Liver Function Tests: No results for input(s): AST, ALT, ALKPHOS, BILITOT, PROT, ALBUMIN in the last 168 hours. No results for input(s): LIPASE, AMYLASE in the last 168 hours. No results for input(s): AMMONIA in the last 168 hours. Coagulation Profile: No results for input(s): INR, PROTIME in the last 168 hours. Cardiac Enzymes: No results for input(s): CKTOTAL, CKMB, CKMBINDEX, TROPONINI in the last 168 hours. BNP (last 3 results) No results for input(s): PROBNP in the last 8760 hours. HbA1C: No results for input(s): HGBA1C in the last 72 hours. CBG: Recent Labs  Lab 04/23/17 0952 04/29/17 1402  GLUCAP 165* 268*   Lipid Profile: No results for input(s): CHOL, HDL, LDLCALC, TRIG, CHOLHDL, LDLDIRECT in the last 72 hours. Thyroid Function Tests: No results for input(s): TSH, T4TOTAL, FREET4, T3FREE, THYROIDAB in the last 72 hours. Anemia Panel: No results for input(s): VITAMINB12, FOLATE,  FERRITIN, TIBC, IRON, RETICCTPCT in the last 72 hours. Urine analysis:    Component Value Date/Time   COLORURINE AMBER (A) 04/29/2017 1340   APPEARANCEUR HAZY (A) 04/29/2017 1340   LABSPEC 1.018 04/29/2017 1340   PHURINE 6.0 04/29/2017 1340   GLUCOSEU 50 (A) 04/29/2017 1340   HGBUR NEGATIVE 04/29/2017 1340   BILIRUBINUR NEGATIVE 04/29/2017 1340   KETONESUR 20 (A) 04/29/2017 1340   PROTEINUR 30 (A) 04/29/2017 1340   NITRITE NEGATIVE 04/29/2017 1340   LEUKOCYTESUR NEGATIVE 04/29/2017 1340   Sepsis Labs: !!!!!!!!!!!!!!!!!!!!!!!!!!!!!!!!!!!!!!!!!!!! @LABRCNTIP (procalcitonin:4,lacticidven:4) )No results found for this or any previous visit (from the past 240 hour(s)).   Radiological Exams on Admission: Dg Chest 2 View  Result Date: 04/29/2017 CLINICAL DATA:  Cough and congestion. EXAM: CHEST  2 VIEW COMPARISON:  Chest x-ray dated January 22, 2014. FINDINGS: The heart size and mediastinal contours are within normal limits. Normal pulmonary vascularity. Patchy density in the periphery of the right lower lobe on the frontal view. Linear atelectasis/scarring right upper lobe. The left lung is clear. No pleural effusion or pneumothorax. No acute osseous abnormality. IMPRESSION: Patchy opacities in the periphery of the right lower lobe on the lateral view, suspicious for pneumonia. Followup PA and lateral chest X-ray is recommended in 3-4 weeks following trial of antibiotic therapy to ensure resolution and exclude underlying malignancy. Electronically Signed   By: Titus Dubin M.D.   On: 04/29/2017 14:31    EKG: Independently reviewed.  Sinus tachycardia with no acute changes and PVCs Chest x-ray reviewed right lower lobe infiltrate noted Case discussed with Dr. Lita Mains in the ED Old chart reviewed  Assessment/Plan 72 year old male with pneumonia Principal Problem:   PNA (pneumonia)-patient has taken about 3 days of Z-Pak.  He is not wheezing.  We will check a respiratory panel including  influenza.  Placed on Levaquin.  We will ambulate and check O2 sats.  Patient currently stable and in no respiratory distress.  Obtain blood cultures and sputum cultures.  Active Problems:  High cholesterol-clarify home meds   Hypertension-clarify home meds   GERD (gastroesophageal reflux disease)-clarify home meds   Diabetes (HCC)-sliding scale insulin   Med rec pending per pharmacy review   DVT prophylaxis: Lovenox Code Status: Full Family Communication: None Disposition Plan: Per day team Consults called: None Admission status: Admission   Nira Visscher A MD [facility]  If 7PM-7AM, please contact night-coverage [email or web address] Password [facility]  04/29/2017, 3:26 PM

## 2017-04-29 NOTE — ED Notes (Signed)
Pt ambulated around nurses station, O2 sat 94-95% during ambulation. Pt had steady gait and minimal assistance.

## 2017-04-30 DIAGNOSIS — I10 Essential (primary) hypertension: Secondary | ICD-10-CM | POA: Diagnosis not present

## 2017-04-30 DIAGNOSIS — J181 Lobar pneumonia, unspecified organism: Secondary | ICD-10-CM

## 2017-04-30 DIAGNOSIS — E119 Type 2 diabetes mellitus without complications: Secondary | ICD-10-CM | POA: Diagnosis not present

## 2017-04-30 DIAGNOSIS — J189 Pneumonia, unspecified organism: Secondary | ICD-10-CM | POA: Diagnosis not present

## 2017-04-30 DIAGNOSIS — E7800 Pure hypercholesterolemia, unspecified: Secondary | ICD-10-CM | POA: Diagnosis not present

## 2017-04-30 DIAGNOSIS — K219 Gastro-esophageal reflux disease without esophagitis: Secondary | ICD-10-CM | POA: Diagnosis not present

## 2017-04-30 DIAGNOSIS — R0902 Hypoxemia: Secondary | ICD-10-CM | POA: Diagnosis not present

## 2017-04-30 LAB — BASIC METABOLIC PANEL
ANION GAP: 10 (ref 5–15)
BUN: 19 mg/dL (ref 6–20)
CHLORIDE: 96 mmol/L — AB (ref 101–111)
CO2: 24 mmol/L (ref 22–32)
Calcium: 8 mg/dL — ABNORMAL LOW (ref 8.9–10.3)
Creatinine, Ser: 0.89 mg/dL (ref 0.61–1.24)
GFR calc Af Amer: >60 mL/min (ref >60)
GFR calc non Af Amer: >60 mL/min (ref >60)
GLUCOSE: 239 mg/dL — AB (ref 65–99)
Potassium: 3.5 mmol/L (ref 3.5–5.1)
Sodium: 130 mmol/L — ABNORMAL LOW (ref 135–145)

## 2017-04-30 LAB — CBC WITH DIFFERENTIAL/PLATELET
Basophils Absolute: 0 10*3/uL (ref 0.0–0.1)
Basophils Relative: 0 %
Eosinophils Absolute: 0 10*3/uL (ref 0.0–0.7)
Eosinophils Relative: 0 %
HEMATOCRIT: 33.6 % — AB (ref 39.0–52.0)
HEMOGLOBIN: 11.4 g/dL — AB (ref 13.0–17.0)
LYMPHS ABS: 1.2 10*3/uL (ref 0.7–4.0)
LYMPHS PCT: 9 %
MCH: 29.5 pg (ref 26.0–34.0)
MCHC: 33.9 g/dL (ref 30.0–36.0)
MCV: 87 fL (ref 78.0–100.0)
MONO ABS: 0.9 10*3/uL (ref 0.1–1.0)
Monocytes Relative: 7 %
NEUTROS ABS: 10.3 10*3/uL — AB (ref 1.7–7.7)
Neutrophils Relative %: 84 %
Platelets: 210 10*3/uL (ref 150–400)
RBC: 3.86 MIL/uL — ABNORMAL LOW (ref 4.22–5.81)
RDW: 13.5 % (ref 11.5–15.5)
WBC: 12.4 10*3/uL — ABNORMAL HIGH (ref 4.0–10.5)

## 2017-04-30 LAB — RESPIRATORY PANEL BY PCR
ADENOVIRUS-RVPPCR: NOT DETECTED
Bordetella pertussis: NOT DETECTED
CORONAVIRUS 229E-RVPPCR: NOT DETECTED
CORONAVIRUS HKU1-RVPPCR: NOT DETECTED
CORONAVIRUS NL63-RVPPCR: NOT DETECTED
CORONAVIRUS OC43-RVPPCR: NOT DETECTED
Chlamydophila pneumoniae: NOT DETECTED
INFLUENZA A-RVPPCR: NOT DETECTED
INFLUENZA B-RVPPCR: NOT DETECTED
Influenza A H1 2009: NOT DETECTED
Influenza A H1: NOT DETECTED
Influenza A H3: NOT DETECTED
Metapneumovirus: NOT DETECTED
Mycoplasma pneumoniae: NOT DETECTED
PARAINFLUENZA VIRUS 1-RVPPCR: NOT DETECTED
PARAINFLUENZA VIRUS 4-RVPPCR: NOT DETECTED
Parainfluenza Virus 2: NOT DETECTED
Parainfluenza Virus 3: NOT DETECTED
Respiratory Syncytial Virus: DETECTED — AB
Rhinovirus / Enterovirus: NOT DETECTED

## 2017-04-30 LAB — GLUCOSE, CAPILLARY
Glucose-Capillary: 195 mg/dL — ABNORMAL HIGH (ref 65–99)
Glucose-Capillary: 214 mg/dL — ABNORMAL HIGH (ref 65–99)

## 2017-04-30 MED ORDER — GABAPENTIN 300 MG PO CAPS: 300.0000 mg | ORAL_CAPSULE | Freq: Two times a day (BID) | ORAL | Status: DC

## 2017-04-30 MED ORDER — METFORMIN HCL 850 MG PO TABS
850.0000 mg | ORAL_TABLET | Freq: Two times a day (BID) | ORAL | Status: DC
  Filled 2017-04-30 (×6): qty 1

## 2017-04-30 MED ORDER — ROSUVASTATIN CALCIUM 10 MG PO TABS: 5.0000 mg | ORAL_TABLET | ORAL | Status: DC

## 2017-04-30 MED ORDER — PANTOPRAZOLE SODIUM 40 MG PO TBEC: 40.0000 mg | DELAYED_RELEASE_TABLET | Freq: Every day | ORAL | Status: DC

## 2017-04-30 MED ORDER — METOPROLOL SUCCINATE ER 50 MG PO TB24: 50.0000 mg | ORAL_TABLET | Freq: Every day | ORAL | Status: DC

## 2017-04-30 MED ORDER — OXYCODONE-ACETAMINOPHEN 7.5-325 MG PO TABS: 1.0000 | ORAL_TABLET | Freq: Two times a day (BID) | ORAL | Status: DC

## 2017-04-30 MED ORDER — LEVOFLOXACIN 750 MG PO TABS: 750.0000 mg | ORAL_TABLET | Freq: Every day | ORAL | 0 refills | Status: AC

## 2017-04-30 MED ORDER — ADULT MULTIVITAMIN W/MINERALS CH: 1.0000 | ORAL_TABLET | Freq: Every day | ORAL | Status: DC

## 2017-04-30 MED ORDER — LOSARTAN POTASSIUM 50 MG PO TABS: 100.0000 mg | ORAL_TABLET | Freq: Every day | ORAL | Status: DC

## 2017-04-30 NOTE — Discharge Summary (Signed)
Physician Discharge Summary  Jason Woods HKV:[id number] DOB: 12/20/1945 DOA: 04/29/2017  PCP: Jason Chroman, MD  Admit date: 04/29/2017 Discharge date: 04/30/2017  Time spent: 45 minutes  Recommendations for Outpatient Follow-up:  -Will be discharged home today. -Advised to follow up with PCP in 2 weeks. -Needs to complete 5 days of levaquin.   Discharge Diagnoses:  Principal Problem:   PNA (pneumonia) Active Problems:   High cholesterol   Hypertension   GERD (gastroesophageal reflux disease)   Diabetes ([place])   Discharge Condition: Stable and improved  Filed Weights   04/29/17 1338 04/29/17 2132  Weight: 78.9 kg (174 lb) 77.8 kg (171 lb 8.3 oz)    History of present illness:  As per Dr. Shanon Woods on 2/7: Jason Woods is a 72 y.o. male with medical history significant of diabetes, hypertension, GERD comes in with over a week of coughing and shortness of breath at home.  Patient denies any chest pain.  He has been having some chills.  He went to see his physician on Monday which was 4 days ago was started on prednisone and Z-Pak.  He has not felt better with this.  He has been having a lot of nasal congestion.  Denies any lower extremity edema.  Patient denies any choking or coughing normally when he eats.  He is not been feeling well for the last several days.  Patient found to have pneumonia referred for admission for pneumonia and hypoxia.  His O2 sats around 92% on room air.    Hospital Course:   Community-acquired pneumonia -Clinically improved, not requiring oxygen -No fever or leukocytosis. -We will discharge on 5 days of Levaquin. -Would recommend repeat chest x-ray in 4-6 weeks to ensure complete resolution of pneumonia.   Rest of chronic medical conditions have been stable and home medications have not been altered during this short hospitalization.    Procedures:  None  Consultations:  None  Discharge Instructions  Discharge Instructions    Diet - low sodium heart healthy   Complete by:  As directed    Increase activity slowly   Complete by:  As directed      Allergies as of 04/30/2017      Reactions   Celexa [citalopram Hydrobromide]    Nausea,blurred vision   Crestor [rosuvastatin]    Pain, aching muscles   Lipitor [atorvastatin]    Aching, MS   Mobic [meloxicam]    nausea   Prozac [fluoxetine Hcl]    fatigue   Zocor [simvastatin-high Dose]    MS aches      Medication List    STOP taking these medications   azithromycin 250 MG tablet Commonly known as:  ZITHROMAX     TAKE these medications   acetaminophen 500 MG tablet Commonly known as:  TYLENOL Take 1,000 mg by mouth every 8 (eight) hours as needed for mild pain or headache.   ALPRAZolam 1 MG tablet Commonly known as:  XANAX Take 1 mg by mouth 2 (two) times daily.   amLODipine 5 MG tablet Commonly known as:  NORVASC Take 5 mg by mouth daily.   b complex vitamins tablet Take 1 tablet by mouth daily.   bimatoprost 0.01 % Soln Commonly known as:  LUMIGAN Place 1 drop into the right eye at bedtime.   cyanocobalamin 2000 MCG tablet Take 2,000 mcg by mouth daily.   D 2000 2000 units Tabs Generic drug:  Cholecalciferol Take 2,000 Units by mouth daily.   dorzolamide  2 % ophthalmic solution Commonly known as:  TRUSOPT Place 1 drop into both eyes 2 (two) times daily.   fluticasone 50 MCG/ACT nasal spray Commonly known as:  FLONASE Place 2 sprays into both nostrils daily.   gabapentin 300 MG capsule Commonly known as:  NEURONTIN Take 300 mg by mouth 2 (two) times daily.   hydrochlorothiazide 25 MG tablet Commonly known as:  HYDRODIURIL Take 25 mg by mouth daily.   hydrocortisone cream 1 % Apply 1 application topically daily as needed for itching (dry skin).   hydrOXYzine 25 MG tablet Commonly known as:  ATARAX/VISTARIL TAKE 1 TABLET BY MOUTH AT BEDTIME AS NEEDED FOR ITCHING   levofloxacin 750 MG tablet Commonly known as:   LEVAQUIN Take 1 tablet (750 mg total) by mouth daily for 5 days.   losartan 100 MG tablet Commonly known as:  COZAAR Take 100 mg by mouth daily.   metFORMIN 850 MG tablet Commonly known as:  GLUCOPHAGE Take 850 mg by mouth 2 (two) times daily with a meal.   metoprolol succinate 50 MG 24 hr tablet Commonly known as:  TOPROL-XL Take 50 mg by mouth daily. Take with or immediately following a meal.   multivitamin with minerals Tabs tablet Take 1 tablet by mouth daily.   NOVOLIN N Sharon Inject 10-35 Units into the skin 2 (two) times daily. 35 units in am and 10 units in pm   oxyCODONE-acetaminophen 7.5-325 MG tablet Commonly known as:  PERCOCET Take 1 tablet by mouth 2 (two) times daily.   pantoprazole 40 MG tablet Commonly known as:  PROTONIX Take 40 mg by mouth daily.   polyethylene glycol powder powder Commonly known as:  GLYCOLAX/MIRALAX MIX 1 PACKET OF POWDER (17 GM) IN 8 OZ OF JUICE OR WATER AND DRINK BY MOUTH DAILY   rosuvastatin 5 MG tablet Commonly known as:  CRESTOR Take 5 mg by mouth See admin instructions. Only takes 1 time month because it makes his legs and muscles hurt   SYSTANE OP Apply 2 drops to eye 2 (two) times daily.   triamcinolone cream 0.1 % Commonly known as:  KENALOG APPLY TO THE AFFECTED AREA(S) ONCE DAILY AS NEEDED      Allergies  Allergen Reactions  . Celexa [Citalopram Hydrobromide]     Nausea,blurred vision  . Crestor [Rosuvastatin]     Pain, aching muscles  . Lipitor [Atorvastatin]     Aching, MS  . Mobic [Meloxicam]     nausea  . Prozac [Fluoxetine Hcl]     fatigue  . Zocor [Simvastatin-High Dose]     MS aches   Follow-up Information    Woods, Jason B, MD. Schedule an appointment as soon as possible for a visit in 2 week(s).   Specialty:  Internal Medicine Contact information: [place] [address] [phone]            The results of significant diagnostics from this hospitalization (including imaging,  microbiology, ancillary and laboratory) are listed below for reference.    Significant Diagnostic Studies: Dg Chest 2 View  Result Date: 04/29/2017 CLINICAL DATA:  Cough and congestion. EXAM: CHEST  2 VIEW COMPARISON:  Chest x-ray dated January 22, 2014. FINDINGS: The heart size and mediastinal contours are within normal limits. Normal pulmonary vascularity. Patchy density in the periphery of the right lower lobe on the frontal view. Linear atelectasis/scarring right upper lobe. The left lung is clear. No pleural effusion or pneumothorax. No acute osseous abnormality. IMPRESSION: Patchy opacities in  the periphery of the right lower lobe on the lateral view, suspicious for pneumonia. Followup PA and lateral chest X-ray is recommended in 3-4 weeks following trial of antibiotic therapy to ensure resolution and exclude underlying malignancy. Electronically Signed   By: Titus Dubin M.D.   On: 04/29/2017 14:31    Microbiology: Recent Results (from the past 240 hour(s))  Culture, blood (routine x 2) Call MD if unable to obtain prior to antibiotics being given     Status: None (Preliminary result)   Collection Time: 04/29/17  3:44 PM  Result Value Ref Range Status   Specimen Description LEFT ANTECUBITAL  Final   Special Requests   Final    BOTTLES DRAWN AEROBIC AND ANAEROBIC Blood Culture adequate volume   Culture   Final    NO GROWTH < 24 HOURS Performed at [facility], [address], [place], [place] [address]    Report Status PENDING  Incomplete  Culture, blood (routine x 2) Call MD if unable to obtain prior to antibiotics being given     Status: None (Preliminary result)   Collection Time: 04/29/17  3:44 PM  Result Value Ref Range Status   Specimen Description BLOOD LEFT HAND  Final   Special Requests   Final    BOTTLES DRAWN AEROBIC ONLY Blood Culture adequate volume   Culture   Final    NO GROWTH < 24 HOURS Performed at [facility], [address] [place], [place] [address]     Report Status PENDING  Incomplete     Labs: Basic Metabolic Panel: Recent Labs  Lab 04/29/17 1416 04/30/17 0842  NA 130* 130*  K 3.2* 3.5  CL 93* 96*  CO2 24 24  GLUCOSE 275* 239*  BUN 19 19  CREATININE 1.08 0.89  CALCIUM 8.2* 8.0*   Liver Function Tests: No results for input(s): AST, ALT, ALKPHOS, BILITOT, PROT, ALBUMIN in the last 168 hours. No results for input(s): LIPASE, AMYLASE in the last 168 hours. No results for input(s): AMMONIA in the last 168 hours. CBC: Recent Labs  Lab 04/29/17 1416 04/30/17 0842  WBC 19.7* 12.4*  NEUTROABS  --  10.3*  HGB 12.7* 11.4*  HCT 38.3* 33.6*  MCV 86.5 87.0  PLT 245 210   Cardiac Enzymes: No results for input(s): CKTOTAL, CKMB, CKMBINDEX, TROPONINI in the last 168 hours. BNP: BNP (last 3 results) No results for input(s): BNP in the last 8760 hours.  ProBNP (last 3 results) No results for input(s): PROBNP in the last 8760 hours.  CBG: Recent Labs  Lab 04/29/17 1402 04/29/17 1700 04/29/17 2132 04/30/17 0832 04/30/17 1157  GLUCAP 268* 270* 232* 195* 214*       Signed:  [facility] Pager: [phone] 04/30/2017, 4:47 PM

## 2017-04-30 NOTE — Progress Notes (Signed)
Called Mr. Jason Woods to inform him he was positive for RSV. Advised to cover mouth when coughing and wash hands often.

## 2017-04-30 NOTE — Care Management Note (Signed)
Case Management Note  Patient Details  Name: Jason Woods MRN: [id number] Date of Birth: Feb 15, 1946  Subjective/Objective:  Adm with Pna. From home with brother. No DME or HH pta. Ind with ADL's. Has PCP-brother drives him to appointments or he uses [facility]. Has Medicare and Medicaid. No issues getting medications.                  Action/Plan: DC home. No CM needs known.   Expected Discharge Date:    05/01/2017              Expected Discharge Plan:  Home/Self Care  In-House Referral:     Discharge planning Services  CM Consult  Post Acute Care Choice:  NA Choice offered to:  NA  DME Arranged:    DME Agency:     HH Arranged:    HH Agency:     Status of Service:  Completed, signed off  If discussed at [employer] of Stay Meetings, dates discussed:    Additional Comments:  Vinette Crites, Chauncey Reading, RN 04/30/2017, 1:12 PM

## 2017-04-30 NOTE — Progress Notes (Signed)
Has been walking in hallway today with family.  To be discharged home on po abx.  IV removed and discharge instructions given.  Waiting for family to pick up

## 2017-04-30 NOTE — Progress Notes (Signed)
Home meds not ordered.  Texted Dr Jerilee Hoh

## 2017-04-30 NOTE — Progress Notes (Signed)
Inpatient Diabetes Program Recommendations  AACE/ADA: New Consensus Statement on Inpatient Glycemic Control (2015)  Target Ranges:  Prepandial:   less than 140 mg/dL      Peak postprandial:   less than 180 mg/dL (1-2 hours)      Critically ill patients:  140 - 180 mg/dL   Lab Results  Component Value Date   GLUCAP 195 (H) 04/30/2017    Review of Glycemic ControlResults for BANE, HAGY (MRN [id number]) as of 04/30/2017 11:02  Ref. Range 04/29/2017 14:02 04/29/2017 17:00 04/29/2017 21:32 04/30/2017 08:32  Glucose-Capillary Latest Ref Range: 65 - 99 mg/dL 268 (H) 270 (H) 232 (H) 195 (H)   Diabetes history:  Type 2 DM Outpatient Diabetes medications: NPH 35 units q AM and NPH 10 units q PM Metformin 850 mg bid Current orders for Inpatient glycemic control:  Novolog sensitive tid with meals  Inpatient Diabetes Program Recommendations:   Please consider restarting NPH 15 units q AM and 5 units q PM (this is approximately 1/2 of home NPH).   Thanks , Adah Perl, RN, BC-ADM Inpatient Diabetes Coordinator Pager [phone] (8a-5p)

## 2017-05-01 DIAGNOSIS — E119 Type 2 diabetes mellitus without complications: Secondary | ICD-10-CM | POA: Diagnosis not present

## 2017-05-01 DIAGNOSIS — J189 Pneumonia, unspecified organism: Secondary | ICD-10-CM | POA: Diagnosis not present

## 2017-05-01 DIAGNOSIS — F17200 Nicotine dependence, unspecified, uncomplicated: Secondary | ICD-10-CM | POA: Diagnosis not present

## 2017-05-01 DIAGNOSIS — Z8701 Personal history of pneumonia (recurrent): Secondary | ICD-10-CM | POA: Diagnosis not present

## 2017-05-01 DIAGNOSIS — F419 Anxiety disorder, unspecified: Secondary | ICD-10-CM | POA: Diagnosis not present

## 2017-05-01 DIAGNOSIS — Z79899 Other long term (current) drug therapy: Secondary | ICD-10-CM | POA: Diagnosis not present

## 2017-05-01 DIAGNOSIS — B974 Respiratory syncytial virus as the cause of diseases classified elsewhere: Secondary | ICD-10-CM | POA: Diagnosis not present

## 2017-05-01 DIAGNOSIS — M549 Dorsalgia, unspecified: Secondary | ICD-10-CM | POA: Diagnosis not present

## 2017-05-01 DIAGNOSIS — Z720 Tobacco use: Secondary | ICD-10-CM | POA: Diagnosis not present

## 2017-05-01 DIAGNOSIS — R112 Nausea with vomiting, unspecified: Secondary | ICD-10-CM | POA: Diagnosis not present

## 2017-05-01 DIAGNOSIS — K219 Gastro-esophageal reflux disease without esophagitis: Secondary | ICD-10-CM | POA: Diagnosis not present

## 2017-05-01 DIAGNOSIS — Z794 Long term (current) use of insulin: Secondary | ICD-10-CM | POA: Diagnosis not present

## 2017-05-01 DIAGNOSIS — I10 Essential (primary) hypertension: Secondary | ICD-10-CM | POA: Diagnosis not present

## 2017-05-01 DIAGNOSIS — R0602 Shortness of breath: Secondary | ICD-10-CM | POA: Diagnosis not present

## 2017-05-01 LAB — STREP PNEUMONIAE URINARY ANTIGEN: STREP PNEUMO URINARY ANTIGEN: NEGATIVE

## 2017-05-04 LAB — CULTURE, BLOOD (ROUTINE X 2)
CULTURE: NO GROWTH
CULTURE: NO GROWTH
SPECIAL REQUESTS: ADEQUATE
Special Requests: ADEQUATE

## 2017-05-13 DIAGNOSIS — E1142 Type 2 diabetes mellitus with diabetic polyneuropathy: Secondary | ICD-10-CM | POA: Diagnosis not present

## 2017-05-13 DIAGNOSIS — Z6826 Body mass index (BMI) 26.0-26.9, adult: Secondary | ICD-10-CM | POA: Diagnosis not present

## 2017-05-13 DIAGNOSIS — J189 Pneumonia, unspecified organism: Secondary | ICD-10-CM | POA: Diagnosis not present

## 2017-05-13 DIAGNOSIS — I10 Essential (primary) hypertension: Secondary | ICD-10-CM | POA: Diagnosis not present

## 2017-05-13 DIAGNOSIS — E1165 Type 2 diabetes mellitus with hyperglycemia: Secondary | ICD-10-CM | POA: Diagnosis not present

## 2017-05-13 DIAGNOSIS — Z09 Encounter for follow-up examination after completed treatment for conditions other than malignant neoplasm: Secondary | ICD-10-CM | POA: Diagnosis not present

## 2017-05-13 DIAGNOSIS — Z299 Encounter for prophylactic measures, unspecified: Secondary | ICD-10-CM | POA: Diagnosis not present

## 2017-05-13 DIAGNOSIS — E11319 Type 2 diabetes mellitus with unspecified diabetic retinopathy without macular edema: Secondary | ICD-10-CM | POA: Diagnosis not present

## 2017-05-24 DIAGNOSIS — M5030 Other cervical disc degeneration, unspecified cervical region: Secondary | ICD-10-CM | POA: Diagnosis not present

## 2017-05-24 DIAGNOSIS — M50323 Other cervical disc degeneration at C6-C7 level: Secondary | ICD-10-CM | POA: Diagnosis not present

## 2017-05-24 DIAGNOSIS — Z794 Long term (current) use of insulin: Secondary | ICD-10-CM | POA: Diagnosis not present

## 2017-05-24 DIAGNOSIS — K219 Gastro-esophageal reflux disease without esophagitis: Secondary | ICD-10-CM | POA: Diagnosis not present

## 2017-05-24 DIAGNOSIS — R05 Cough: Secondary | ICD-10-CM | POA: Diagnosis not present

## 2017-05-24 DIAGNOSIS — E119 Type 2 diabetes mellitus without complications: Secondary | ICD-10-CM | POA: Diagnosis not present

## 2017-05-24 DIAGNOSIS — M19011 Primary osteoarthritis, right shoulder: Secondary | ICD-10-CM | POA: Diagnosis not present

## 2017-05-24 DIAGNOSIS — M25511 Pain in right shoulder: Secondary | ICD-10-CM | POA: Diagnosis not present

## 2017-05-24 DIAGNOSIS — M542 Cervicalgia: Secondary | ICD-10-CM | POA: Diagnosis not present

## 2017-05-24 DIAGNOSIS — F419 Anxiety disorder, unspecified: Secondary | ICD-10-CM | POA: Diagnosis not present

## 2017-05-24 DIAGNOSIS — I10 Essential (primary) hypertension: Secondary | ICD-10-CM | POA: Diagnosis not present

## 2017-05-24 DIAGNOSIS — F17200 Nicotine dependence, unspecified, uncomplicated: Secondary | ICD-10-CM | POA: Diagnosis not present

## 2017-05-24 DIAGNOSIS — Z79899 Other long term (current) drug therapy: Secondary | ICD-10-CM | POA: Diagnosis not present

## 2017-05-26 DIAGNOSIS — E1165 Type 2 diabetes mellitus with hyperglycemia: Secondary | ICD-10-CM | POA: Diagnosis not present

## 2017-05-26 DIAGNOSIS — Z299 Encounter for prophylactic measures, unspecified: Secondary | ICD-10-CM | POA: Diagnosis not present

## 2017-05-26 DIAGNOSIS — M1990 Unspecified osteoarthritis, unspecified site: Secondary | ICD-10-CM | POA: Diagnosis not present

## 2017-05-26 DIAGNOSIS — J039 Acute tonsillitis, unspecified: Secondary | ICD-10-CM | POA: Diagnosis not present

## 2017-05-26 DIAGNOSIS — Z6826 Body mass index (BMI) 26.0-26.9, adult: Secondary | ICD-10-CM | POA: Diagnosis not present

## 2017-05-26 DIAGNOSIS — I10 Essential (primary) hypertension: Secondary | ICD-10-CM | POA: Diagnosis not present

## 2017-05-31 DIAGNOSIS — L259 Unspecified contact dermatitis, unspecified cause: Secondary | ICD-10-CM | POA: Diagnosis not present

## 2017-05-31 DIAGNOSIS — Z85828 Personal history of other malignant neoplasm of skin: Secondary | ICD-10-CM | POA: Diagnosis not present

## 2017-05-31 DIAGNOSIS — L570 Actinic keratosis: Secondary | ICD-10-CM | POA: Diagnosis not present

## 2017-06-03 DIAGNOSIS — M545 Low back pain: Secondary | ICD-10-CM | POA: Diagnosis not present

## 2017-06-03 DIAGNOSIS — Z79891 Long term (current) use of opiate analgesic: Secondary | ICD-10-CM | POA: Diagnosis not present

## 2017-06-03 DIAGNOSIS — M25559 Pain in unspecified hip: Secondary | ICD-10-CM | POA: Diagnosis not present

## 2017-06-03 DIAGNOSIS — M5412 Radiculopathy, cervical region: Secondary | ICD-10-CM | POA: Diagnosis not present

## 2017-06-04 DIAGNOSIS — I700 Atherosclerosis of aorta: Secondary | ICD-10-CM | POA: Diagnosis not present

## 2017-06-04 DIAGNOSIS — J189 Pneumonia, unspecified organism: Secondary | ICD-10-CM | POA: Diagnosis not present

## 2017-06-09 DIAGNOSIS — I10 Essential (primary) hypertension: Secondary | ICD-10-CM | POA: Diagnosis not present

## 2017-06-09 DIAGNOSIS — E119 Type 2 diabetes mellitus without complications: Secondary | ICD-10-CM | POA: Diagnosis not present

## 2017-06-09 DIAGNOSIS — E7800 Pure hypercholesterolemia, unspecified: Secondary | ICD-10-CM | POA: Diagnosis not present

## 2017-06-15 ENCOUNTER — Other Ambulatory Visit (HOSPITAL_COMMUNITY): Payer: Self-pay | Admitting: Neurology

## 2017-06-15 DIAGNOSIS — E1165 Type 2 diabetes mellitus with hyperglycemia: Secondary | ICD-10-CM | POA: Diagnosis not present

## 2017-06-15 DIAGNOSIS — E11319 Type 2 diabetes mellitus with unspecified diabetic retinopathy without macular edema: Secondary | ICD-10-CM | POA: Diagnosis not present

## 2017-06-15 DIAGNOSIS — Z6826 Body mass index (BMI) 26.0-26.9, adult: Secondary | ICD-10-CM | POA: Diagnosis not present

## 2017-06-15 DIAGNOSIS — Z299 Encounter for prophylactic measures, unspecified: Secondary | ICD-10-CM | POA: Diagnosis not present

## 2017-06-15 DIAGNOSIS — G8929 Other chronic pain: Secondary | ICD-10-CM

## 2017-06-15 DIAGNOSIS — M542 Cervicalgia: Principal | ICD-10-CM

## 2017-06-15 DIAGNOSIS — E1142 Type 2 diabetes mellitus with diabetic polyneuropathy: Secondary | ICD-10-CM | POA: Diagnosis not present

## 2017-06-15 DIAGNOSIS — J069 Acute upper respiratory infection, unspecified: Secondary | ICD-10-CM | POA: Diagnosis not present

## 2017-06-17 DIAGNOSIS — E11319 Type 2 diabetes mellitus with unspecified diabetic retinopathy without macular edema: Secondary | ICD-10-CM | POA: Diagnosis not present

## 2017-06-17 DIAGNOSIS — J069 Acute upper respiratory infection, unspecified: Secondary | ICD-10-CM | POA: Diagnosis not present

## 2017-06-17 DIAGNOSIS — M545 Low back pain: Secondary | ICD-10-CM | POA: Diagnosis not present

## 2017-06-17 DIAGNOSIS — E1165 Type 2 diabetes mellitus with hyperglycemia: Secondary | ICD-10-CM | POA: Diagnosis not present

## 2017-06-17 DIAGNOSIS — F329 Major depressive disorder, single episode, unspecified: Secondary | ICD-10-CM | POA: Diagnosis not present

## 2017-06-17 DIAGNOSIS — Z794 Long term (current) use of insulin: Secondary | ICD-10-CM | POA: Diagnosis not present

## 2017-06-17 DIAGNOSIS — H3530 Unspecified macular degeneration: Secondary | ICD-10-CM | POA: Diagnosis not present

## 2017-06-17 DIAGNOSIS — E538 Deficiency of other specified B group vitamins: Secondary | ICD-10-CM | POA: Diagnosis not present

## 2017-06-17 DIAGNOSIS — E1142 Type 2 diabetes mellitus with diabetic polyneuropathy: Secondary | ICD-10-CM | POA: Diagnosis not present

## 2017-06-17 DIAGNOSIS — F419 Anxiety disorder, unspecified: Secondary | ICD-10-CM | POA: Diagnosis not present

## 2017-06-17 DIAGNOSIS — M1990 Unspecified osteoarthritis, unspecified site: Secondary | ICD-10-CM | POA: Diagnosis not present

## 2017-06-17 DIAGNOSIS — F17210 Nicotine dependence, cigarettes, uncomplicated: Secondary | ICD-10-CM | POA: Diagnosis not present

## 2017-06-21 ENCOUNTER — Ambulatory Visit (HOSPITAL_COMMUNITY): Payer: Medicare Other

## 2017-06-21 DIAGNOSIS — E11319 Type 2 diabetes mellitus with unspecified diabetic retinopathy without macular edema: Secondary | ICD-10-CM | POA: Diagnosis not present

## 2017-06-21 DIAGNOSIS — J069 Acute upper respiratory infection, unspecified: Secondary | ICD-10-CM | POA: Diagnosis not present

## 2017-06-21 DIAGNOSIS — H3530 Unspecified macular degeneration: Secondary | ICD-10-CM | POA: Diagnosis not present

## 2017-06-21 DIAGNOSIS — E1142 Type 2 diabetes mellitus with diabetic polyneuropathy: Secondary | ICD-10-CM | POA: Diagnosis not present

## 2017-06-21 DIAGNOSIS — M1990 Unspecified osteoarthritis, unspecified site: Secondary | ICD-10-CM | POA: Diagnosis not present

## 2017-06-21 DIAGNOSIS — E1165 Type 2 diabetes mellitus with hyperglycemia: Secondary | ICD-10-CM | POA: Diagnosis not present

## 2017-06-24 DIAGNOSIS — M1990 Unspecified osteoarthritis, unspecified site: Secondary | ICD-10-CM | POA: Diagnosis not present

## 2017-06-24 DIAGNOSIS — E1165 Type 2 diabetes mellitus with hyperglycemia: Secondary | ICD-10-CM | POA: Diagnosis not present

## 2017-06-24 DIAGNOSIS — E11319 Type 2 diabetes mellitus with unspecified diabetic retinopathy without macular edema: Secondary | ICD-10-CM | POA: Diagnosis not present

## 2017-06-24 DIAGNOSIS — H3530 Unspecified macular degeneration: Secondary | ICD-10-CM | POA: Diagnosis not present

## 2017-06-24 DIAGNOSIS — J069 Acute upper respiratory infection, unspecified: Secondary | ICD-10-CM | POA: Diagnosis not present

## 2017-06-24 DIAGNOSIS — E1142 Type 2 diabetes mellitus with diabetic polyneuropathy: Secondary | ICD-10-CM | POA: Diagnosis not present

## 2017-06-28 DIAGNOSIS — M1990 Unspecified osteoarthritis, unspecified site: Secondary | ICD-10-CM | POA: Diagnosis not present

## 2017-06-28 DIAGNOSIS — E11319 Type 2 diabetes mellitus with unspecified diabetic retinopathy without macular edema: Secondary | ICD-10-CM | POA: Diagnosis not present

## 2017-06-28 DIAGNOSIS — E1165 Type 2 diabetes mellitus with hyperglycemia: Secondary | ICD-10-CM | POA: Diagnosis not present

## 2017-06-28 DIAGNOSIS — H3530 Unspecified macular degeneration: Secondary | ICD-10-CM | POA: Diagnosis not present

## 2017-06-28 DIAGNOSIS — E1142 Type 2 diabetes mellitus with diabetic polyneuropathy: Secondary | ICD-10-CM | POA: Diagnosis not present

## 2017-06-28 DIAGNOSIS — J069 Acute upper respiratory infection, unspecified: Secondary | ICD-10-CM | POA: Diagnosis not present

## 2017-06-29 ENCOUNTER — Ambulatory Visit (HOSPITAL_COMMUNITY)
Admission: RE | Admit: 2017-06-29 | Discharge: 2017-06-29 | Disposition: A | Discharge status: Home / Self Care | Payer: Medicare Other | Source: Ambulatory Visit | Attending: Neurology | Admitting: Neurology

## 2017-06-29 DIAGNOSIS — G8929 Other chronic pain: Secondary | ICD-10-CM

## 2017-06-29 DIAGNOSIS — M50322 Other cervical disc degeneration at C5-C6 level: Secondary | ICD-10-CM | POA: Insufficient documentation

## 2017-06-29 DIAGNOSIS — M542 Cervicalgia: Secondary | ICD-10-CM

## 2017-06-29 DIAGNOSIS — M4802 Spinal stenosis, cervical region: Secondary | ICD-10-CM | POA: Insufficient documentation

## 2017-07-01 DIAGNOSIS — Z79891 Long term (current) use of opiate analgesic: Secondary | ICD-10-CM | POA: Diagnosis not present

## 2017-07-01 DIAGNOSIS — M25559 Pain in unspecified hip: Secondary | ICD-10-CM | POA: Diagnosis not present

## 2017-07-01 DIAGNOSIS — M545 Low back pain: Secondary | ICD-10-CM | POA: Diagnosis not present

## 2017-07-01 DIAGNOSIS — M25532 Pain in left wrist: Secondary | ICD-10-CM | POA: Diagnosis not present

## 2017-07-02 DIAGNOSIS — M1990 Unspecified osteoarthritis, unspecified site: Secondary | ICD-10-CM | POA: Diagnosis not present

## 2017-07-02 DIAGNOSIS — H3530 Unspecified macular degeneration: Secondary | ICD-10-CM | POA: Diagnosis not present

## 2017-07-02 DIAGNOSIS — E1165 Type 2 diabetes mellitus with hyperglycemia: Secondary | ICD-10-CM | POA: Diagnosis not present

## 2017-07-02 DIAGNOSIS — E1142 Type 2 diabetes mellitus with diabetic polyneuropathy: Secondary | ICD-10-CM | POA: Diagnosis not present

## 2017-07-02 DIAGNOSIS — J069 Acute upper respiratory infection, unspecified: Secondary | ICD-10-CM | POA: Diagnosis not present

## 2017-07-02 DIAGNOSIS — E11319 Type 2 diabetes mellitus with unspecified diabetic retinopathy without macular edema: Secondary | ICD-10-CM | POA: Diagnosis not present

## 2017-07-07 DIAGNOSIS — E11319 Type 2 diabetes mellitus with unspecified diabetic retinopathy without macular edema: Secondary | ICD-10-CM | POA: Diagnosis not present

## 2017-07-07 DIAGNOSIS — H3530 Unspecified macular degeneration: Secondary | ICD-10-CM | POA: Diagnosis not present

## 2017-07-07 DIAGNOSIS — M1990 Unspecified osteoarthritis, unspecified site: Secondary | ICD-10-CM | POA: Diagnosis not present

## 2017-07-07 DIAGNOSIS — E1142 Type 2 diabetes mellitus with diabetic polyneuropathy: Secondary | ICD-10-CM | POA: Diagnosis not present

## 2017-07-07 DIAGNOSIS — J069 Acute upper respiratory infection, unspecified: Secondary | ICD-10-CM | POA: Diagnosis not present

## 2017-07-07 DIAGNOSIS — E1165 Type 2 diabetes mellitus with hyperglycemia: Secondary | ICD-10-CM | POA: Diagnosis not present

## 2017-07-08 DIAGNOSIS — E1140 Type 2 diabetes mellitus with diabetic neuropathy, unspecified: Secondary | ICD-10-CM | POA: Diagnosis not present

## 2017-07-08 DIAGNOSIS — L609 Nail disorder, unspecified: Secondary | ICD-10-CM | POA: Diagnosis not present

## 2017-07-08 DIAGNOSIS — L110 Acquired keratosis follicularis: Secondary | ICD-10-CM | POA: Diagnosis not present

## 2017-07-09 DIAGNOSIS — I10 Essential (primary) hypertension: Secondary | ICD-10-CM | POA: Diagnosis not present

## 2017-07-09 DIAGNOSIS — E7800 Pure hypercholesterolemia, unspecified: Secondary | ICD-10-CM | POA: Diagnosis not present

## 2017-07-09 DIAGNOSIS — E119 Type 2 diabetes mellitus without complications: Secondary | ICD-10-CM | POA: Diagnosis not present

## 2017-07-13 DIAGNOSIS — J069 Acute upper respiratory infection, unspecified: Secondary | ICD-10-CM | POA: Diagnosis not present

## 2017-07-13 DIAGNOSIS — M1990 Unspecified osteoarthritis, unspecified site: Secondary | ICD-10-CM | POA: Diagnosis not present

## 2017-07-13 DIAGNOSIS — E1142 Type 2 diabetes mellitus with diabetic polyneuropathy: Secondary | ICD-10-CM | POA: Diagnosis not present

## 2017-07-13 DIAGNOSIS — E1165 Type 2 diabetes mellitus with hyperglycemia: Secondary | ICD-10-CM | POA: Diagnosis not present

## 2017-07-13 DIAGNOSIS — E11319 Type 2 diabetes mellitus with unspecified diabetic retinopathy without macular edema: Secondary | ICD-10-CM | POA: Diagnosis not present

## 2017-07-13 DIAGNOSIS — H3530 Unspecified macular degeneration: Secondary | ICD-10-CM | POA: Diagnosis not present

## 2017-07-30 DIAGNOSIS — E1165 Type 2 diabetes mellitus with hyperglycemia: Secondary | ICD-10-CM | POA: Diagnosis not present

## 2017-07-30 DIAGNOSIS — Z7984 Long term (current) use of oral hypoglycemic drugs: Secondary | ICD-10-CM | POA: Diagnosis not present

## 2017-07-30 DIAGNOSIS — E113293 Type 2 diabetes mellitus with mild nonproliferative diabetic retinopathy without macular edema, bilateral: Secondary | ICD-10-CM | POA: Diagnosis not present

## 2017-07-30 DIAGNOSIS — Z794 Long term (current) use of insulin: Secondary | ICD-10-CM | POA: Diagnosis not present

## 2017-08-24 DIAGNOSIS — Z794 Long term (current) use of insulin: Secondary | ICD-10-CM | POA: Diagnosis not present

## 2017-08-24 DIAGNOSIS — H40001 Preglaucoma, unspecified, right eye: Secondary | ICD-10-CM | POA: Diagnosis not present

## 2017-08-24 DIAGNOSIS — Z961 Presence of intraocular lens: Secondary | ICD-10-CM | POA: Diagnosis not present

## 2017-08-24 DIAGNOSIS — Z7984 Long term (current) use of oral hypoglycemic drugs: Secondary | ICD-10-CM | POA: Diagnosis not present

## 2017-08-24 DIAGNOSIS — E113513 Type 2 diabetes mellitus with proliferative diabetic retinopathy with macular edema, bilateral: Secondary | ICD-10-CM | POA: Diagnosis not present

## 2017-08-24 DIAGNOSIS — D3132 Benign neoplasm of left choroid: Secondary | ICD-10-CM | POA: Diagnosis not present

## 2017-08-30 DIAGNOSIS — Z79891 Long term (current) use of opiate analgesic: Secondary | ICD-10-CM | POA: Diagnosis not present

## 2017-08-30 DIAGNOSIS — M545 Low back pain: Secondary | ICD-10-CM | POA: Diagnosis not present

## 2017-08-30 DIAGNOSIS — M25559 Pain in unspecified hip: Secondary | ICD-10-CM | POA: Diagnosis not present

## 2017-08-30 DIAGNOSIS — M25532 Pain in left wrist: Secondary | ICD-10-CM | POA: Diagnosis not present

## 2017-09-04 DIAGNOSIS — F320 Major depressive disorder, single episode, mild: Secondary | ICD-10-CM | POA: Diagnosis not present

## 2017-09-28 DIAGNOSIS — E11319 Type 2 diabetes mellitus with unspecified diabetic retinopathy without macular edema: Secondary | ICD-10-CM | POA: Diagnosis not present

## 2017-09-28 DIAGNOSIS — J069 Acute upper respiratory infection, unspecified: Secondary | ICD-10-CM | POA: Diagnosis not present

## 2017-09-28 DIAGNOSIS — E1165 Type 2 diabetes mellitus with hyperglycemia: Secondary | ICD-10-CM | POA: Diagnosis not present

## 2017-09-28 DIAGNOSIS — I10 Essential (primary) hypertension: Secondary | ICD-10-CM | POA: Diagnosis not present

## 2017-09-28 DIAGNOSIS — Z299 Encounter for prophylactic measures, unspecified: Secondary | ICD-10-CM | POA: Diagnosis not present

## 2017-09-28 DIAGNOSIS — Z6827 Body mass index (BMI) 27.0-27.9, adult: Secondary | ICD-10-CM | POA: Diagnosis not present

## 2017-09-28 DIAGNOSIS — E1142 Type 2 diabetes mellitus with diabetic polyneuropathy: Secondary | ICD-10-CM | POA: Diagnosis not present

## 2017-10-05 ENCOUNTER — Encounter (HOSPITAL_COMMUNITY)
Admission: EM | Disposition: A | Discharge status: Home Health | Payer: Self-pay | Source: Home / Self Care | Attending: Orthopaedic Surgery

## 2017-10-05 ENCOUNTER — Emergency Department (HOSPITAL_COMMUNITY): Payer: Medicare Other

## 2017-10-05 ENCOUNTER — Inpatient Hospital Stay (HOSPITAL_COMMUNITY): Payer: Medicare Other

## 2017-10-05 ENCOUNTER — Encounter (HOSPITAL_COMMUNITY): Payer: Self-pay | Admitting: Certified Registered"

## 2017-10-05 ENCOUNTER — Other Ambulatory Visit: Payer: Self-pay

## 2017-10-05 ENCOUNTER — Inpatient Hospital Stay (HOSPITAL_COMMUNITY): Payer: Medicare Other | Admitting: Certified Registered"

## 2017-10-05 ENCOUNTER — Inpatient Hospital Stay (HOSPITAL_COMMUNITY)
Admission: EM | Admit: 2017-10-05 | Discharge: 2017-10-08 | DRG: 493 | Disposition: A | Discharge status: Home Health | Payer: Medicare Other | Attending: Internal Medicine | Admitting: Internal Medicine

## 2017-10-05 DIAGNOSIS — I959 Hypotension, unspecified: Secondary | ICD-10-CM | POA: Diagnosis present

## 2017-10-05 DIAGNOSIS — Z794 Long term (current) use of insulin: Secondary | ICD-10-CM

## 2017-10-05 DIAGNOSIS — W1830XA Fall on same level, unspecified, initial encounter: Secondary | ICD-10-CM | POA: Diagnosis present

## 2017-10-05 DIAGNOSIS — R918 Other nonspecific abnormal finding of lung field: Secondary | ICD-10-CM | POA: Diagnosis not present

## 2017-10-05 DIAGNOSIS — E11649 Type 2 diabetes mellitus with hypoglycemia without coma: Secondary | ICD-10-CM | POA: Diagnosis present

## 2017-10-05 DIAGNOSIS — G8918 Other acute postprocedural pain: Secondary | ICD-10-CM | POA: Diagnosis not present

## 2017-10-05 DIAGNOSIS — Z888 Allergy status to other drugs, medicaments and biological substances status: Secondary | ICD-10-CM | POA: Diagnosis not present

## 2017-10-05 DIAGNOSIS — H548 Legal blindness, as defined in USA: Secondary | ICD-10-CM | POA: Diagnosis present

## 2017-10-05 DIAGNOSIS — S82852B Displaced trimalleolar fracture of left lower leg, initial encounter for open fracture type I or II: Secondary | ICD-10-CM | POA: Diagnosis present

## 2017-10-05 DIAGNOSIS — Z9181 History of falling: Secondary | ICD-10-CM | POA: Diagnosis not present

## 2017-10-05 DIAGNOSIS — E785 Hyperlipidemia, unspecified: Secondary | ICD-10-CM | POA: Diagnosis present

## 2017-10-05 DIAGNOSIS — I10 Essential (primary) hypertension: Secondary | ICD-10-CM | POA: Diagnosis present

## 2017-10-05 DIAGNOSIS — K219 Gastro-esophageal reflux disease without esophagitis: Secondary | ICD-10-CM | POA: Diagnosis present

## 2017-10-05 DIAGNOSIS — S93439A Sprain of tibiofibular ligament of unspecified ankle, initial encounter: Secondary | ICD-10-CM | POA: Diagnosis present

## 2017-10-05 DIAGNOSIS — W19XXXA Unspecified fall, initial encounter: Secondary | ICD-10-CM | POA: Diagnosis not present

## 2017-10-05 DIAGNOSIS — W1789XA Other fall from one level to another, initial encounter: Secondary | ICD-10-CM | POA: Diagnosis present

## 2017-10-05 DIAGNOSIS — S82892B Other fracture of left lower leg, initial encounter for open fracture type I or II: Secondary | ICD-10-CM | POA: Diagnosis not present

## 2017-10-05 DIAGNOSIS — S82852M Displaced trimalleolar fracture of left lower leg, subsequent encounter for open fracture type I or II with nonunion: Secondary | ICD-10-CM | POA: Diagnosis present

## 2017-10-05 DIAGNOSIS — Q899 Congenital malformation, unspecified: Secondary | ICD-10-CM | POA: Diagnosis not present

## 2017-10-05 DIAGNOSIS — T1490XA Injury, unspecified, initial encounter: Secondary | ICD-10-CM

## 2017-10-05 DIAGNOSIS — R58 Hemorrhage, not elsewhere classified: Secondary | ICD-10-CM | POA: Diagnosis not present

## 2017-10-05 DIAGNOSIS — D649 Anemia, unspecified: Secondary | ICD-10-CM | POA: Diagnosis present

## 2017-10-05 DIAGNOSIS — R059 Cough, unspecified: Secondary | ICD-10-CM

## 2017-10-05 DIAGNOSIS — Z833 Family history of diabetes mellitus: Secondary | ICD-10-CM

## 2017-10-05 DIAGNOSIS — E871 Hypo-osmolality and hyponatremia: Secondary | ICD-10-CM | POA: Diagnosis present

## 2017-10-05 DIAGNOSIS — E7800 Pure hypercholesterolemia, unspecified: Secondary | ICD-10-CM | POA: Diagnosis present

## 2017-10-05 DIAGNOSIS — Z87442 Personal history of urinary calculi: Secondary | ICD-10-CM

## 2017-10-05 DIAGNOSIS — Y92008 Other place in unspecified non-institutional (private) residence as the place of occurrence of the external cause: Secondary | ICD-10-CM

## 2017-10-05 DIAGNOSIS — J449 Chronic obstructive pulmonary disease, unspecified: Secondary | ICD-10-CM | POA: Diagnosis not present

## 2017-10-05 DIAGNOSIS — R42 Dizziness and giddiness: Secondary | ICD-10-CM | POA: Diagnosis not present

## 2017-10-05 DIAGNOSIS — F17210 Nicotine dependence, cigarettes, uncomplicated: Secondary | ICD-10-CM | POA: Diagnosis present

## 2017-10-05 DIAGNOSIS — R531 Weakness: Secondary | ICD-10-CM | POA: Diagnosis not present

## 2017-10-05 DIAGNOSIS — J42 Unspecified chronic bronchitis: Secondary | ICD-10-CM | POA: Diagnosis present

## 2017-10-05 DIAGNOSIS — E860 Dehydration: Secondary | ICD-10-CM | POA: Diagnosis present

## 2017-10-05 DIAGNOSIS — E119 Type 2 diabetes mellitus without complications: Secondary | ICD-10-CM | POA: Diagnosis not present

## 2017-10-05 DIAGNOSIS — R05 Cough: Secondary | ICD-10-CM | POA: Diagnosis not present

## 2017-10-05 DIAGNOSIS — I499 Cardiac arrhythmia, unspecified: Secondary | ICD-10-CM | POA: Diagnosis not present

## 2017-10-05 DIAGNOSIS — S9305XA Dislocation of left ankle joint, initial encounter: Secondary | ICD-10-CM | POA: Diagnosis not present

## 2017-10-05 DIAGNOSIS — R110 Nausea: Secondary | ICD-10-CM | POA: Diagnosis not present

## 2017-10-05 DIAGNOSIS — S99922A Unspecified injury of left foot, initial encounter: Secondary | ICD-10-CM | POA: Diagnosis not present

## 2017-10-05 DIAGNOSIS — S8992XA Unspecified injury of left lower leg, initial encounter: Secondary | ICD-10-CM | POA: Diagnosis not present

## 2017-10-05 DIAGNOSIS — Z8249 Family history of ischemic heart disease and other diseases of the circulatory system: Secondary | ICD-10-CM

## 2017-10-05 DIAGNOSIS — E539 Vitamin B deficiency, unspecified: Secondary | ICD-10-CM | POA: Diagnosis present

## 2017-10-05 DIAGNOSIS — S82842A Displaced bimalleolar fracture of left lower leg, initial encounter for closed fracture: Secondary | ICD-10-CM | POA: Diagnosis not present

## 2017-10-05 HISTORY — PX: ORIF ANKLE FRACTURE: SHX5408

## 2017-10-05 HISTORY — PX: I & D EXTREMITY: SHX5045

## 2017-10-05 LAB — CBC WITH DIFFERENTIAL/PLATELET
ABS IMMATURE GRANULOCYTES: 0.2 10*3/uL — AB (ref 0.0–0.1)
Basophils Absolute: 0.1 10*3/uL (ref 0.0–0.1)
Basophils Relative: 0 %
EOS ABS: 0 10*3/uL (ref 0.0–0.7)
Eosinophils Relative: 0 %
HEMATOCRIT: 32.8 % — AB (ref 39.0–52.0)
Hemoglobin: 10.5 g/dL — ABNORMAL LOW (ref 13.0–17.0)
IMMATURE GRANULOCYTES: 1 %
LYMPHS ABS: 1 10*3/uL (ref 0.7–4.0)
Lymphocytes Relative: 7 %
MCH: 28.8 pg (ref 26.0–34.0)
MCHC: 32 g/dL (ref 30.0–36.0)
MCV: 89.9 fL (ref 78.0–100.0)
MONO ABS: 1.1 10*3/uL — AB (ref 0.1–1.0)
MONOS PCT: 8 %
Neutro Abs: 12.2 10*3/uL — ABNORMAL HIGH (ref 1.7–7.7)
Neutrophils Relative %: 84 %
Platelets: 187 10*3/uL (ref 150–400)
RBC: 3.65 MIL/uL — ABNORMAL LOW (ref 4.22–5.81)
RDW: 13.8 % (ref 11.5–15.5)
WBC: 14.6 10*3/uL — ABNORMAL HIGH (ref 4.0–10.5)

## 2017-10-05 LAB — COMPREHENSIVE METABOLIC PANEL
ALT: 13 U/L (ref 0–44)
AST: 17 U/L (ref 15–41)
Albumin: 2.8 g/dL — ABNORMAL LOW (ref 3.5–5.0)
Alkaline Phosphatase: 51 U/L (ref 38–126)
Anion gap: 11 (ref 5–15)
BILIRUBIN TOTAL: 0.7 mg/dL (ref 0.3–1.2)
BUN: 22 mg/dL (ref 8–23)
CALCIUM: 8.4 mg/dL — AB (ref 8.9–10.3)
CO2: 26 mmol/L (ref 22–32)
CREATININE: 1.28 mg/dL — AB (ref 0.61–1.24)
Chloride: 97 mmol/L — ABNORMAL LOW (ref 98–111)
GFR calc Af Amer: >60 mL/min (ref >60)
GFR, EST NON AFRICAN AMERICAN: 54 mL/min — AB (ref >60)
Glucose, Bld: 56 mg/dL — ABNORMAL LOW (ref 70–99)
Potassium: 3.6 mmol/L (ref 3.5–5.1)
Sodium: 134 mmol/L — ABNORMAL LOW (ref 135–145)
TOTAL PROTEIN: 5.3 g/dL — AB (ref 6.5–8.1)

## 2017-10-05 LAB — URINALYSIS, ROUTINE W REFLEX MICROSCOPIC
Bilirubin Urine: NEGATIVE
Glucose, UA: NEGATIVE mg/dL
Hgb urine dipstick: NEGATIVE
Ketones, ur: NEGATIVE mg/dL
LEUKOCYTES UA: NEGATIVE
Nitrite: NEGATIVE
PH: 5 (ref 5.0–8.0)
Protein, ur: NEGATIVE mg/dL
Specific Gravity, Urine: 1.017 (ref 1.005–1.030)

## 2017-10-05 LAB — GLUCOSE, CAPILLARY
GLUCOSE-CAPILLARY: 121 mg/dL — AB (ref 70–99)
Glucose-Capillary: 82 mg/dL (ref 70–99)
Glucose-Capillary: 85 mg/dL (ref 70–99)

## 2017-10-05 LAB — CBG MONITORING, ED: Glucose-Capillary: 93 mg/dL (ref 70–99)

## 2017-10-05 SURGERY — IRRIGATION AND DEBRIDEMENT EXTREMITY
Anesthesia: General | Site: Ankle | Laterality: Left

## 2017-10-05 MED ORDER — HYDROMORPHONE HCL 1 MG/ML IJ SOLN: 0.5000 mg | INTRAMUSCULAR | Status: DC | PRN

## 2017-10-05 MED ORDER — SUGAMMADEX SODIUM 200 MG/2ML IV SOLN
INTRAVENOUS | Status: DC | PRN
  Administered 2017-10-05: 200 mg via INTRAVENOUS

## 2017-10-05 MED ORDER — LIDOCAINE 2% (20 MG/ML) 5 ML SYRINGE
INTRAMUSCULAR | Status: AC
  Filled 2017-10-05: qty 5

## 2017-10-05 MED ORDER — DEXTROSE 50 % IV SOLN
INTRAVENOUS | Status: AC
  Filled 2017-10-05: qty 50

## 2017-10-05 MED ORDER — ROCURONIUM BROMIDE 10 MG/ML (PF) SYRINGE
PREFILLED_SYRINGE | INTRAVENOUS | Status: AC
  Filled 2017-10-05: qty 10

## 2017-10-05 MED ORDER — DOCUSATE SODIUM 100 MG PO CAPS
100.0000 mg | ORAL_CAPSULE | Freq: Two times a day (BID) | ORAL | Status: DC
  Administered 2017-10-05 – 2017-10-08 (×6): 100 mg via ORAL
  Filled 2017-10-05 (×6): qty 1

## 2017-10-05 MED ORDER — OXYCODONE-ACETAMINOPHEN 7.5-325 MG PO TABS
1.0000 | ORAL_TABLET | Freq: Two times a day (BID) | ORAL | Status: DC
  Administered 2017-10-05 – 2017-10-08 (×6): 1 via ORAL
  Filled 2017-10-05 (×6): qty 1

## 2017-10-05 MED ORDER — ALPRAZOLAM 0.5 MG PO TABS
1.0000 mg | ORAL_TABLET | Freq: Two times a day (BID) | ORAL | Status: DC
  Administered 2017-10-05 – 2017-10-08 (×6): 1 mg via ORAL
  Filled 2017-10-05 (×6): qty 2

## 2017-10-05 MED ORDER — ONDANSETRON HCL 4 MG/2ML IJ SOLN
INTRAMUSCULAR | Status: DC | PRN
  Administered 2017-10-05: 4 mg via INTRAVENOUS

## 2017-10-05 MED ORDER — LATANOPROST 0.005 % OP SOLN
1.0000 [drp] | Freq: Every day | OPHTHALMIC | Status: DC
  Administered 2017-10-05 – 2017-10-07 (×3): 1 [drp] via OPHTHALMIC
  Filled 2017-10-05: qty 2.5

## 2017-10-05 MED ORDER — AMLODIPINE BESYLATE 5 MG PO TABS: 5.0000 mg | ORAL_TABLET | Freq: Every day | ORAL | Status: DC

## 2017-10-05 MED ORDER — ADULT MULTIVITAMIN W/MINERALS CH
1.0000 | ORAL_TABLET | Freq: Every day | ORAL | Status: DC
  Administered 2017-10-06 – 2017-10-08 (×3): 1 via ORAL
  Filled 2017-10-05 (×3): qty 1

## 2017-10-05 MED ORDER — NICOTINE 7 MG/24HR TD PT24
7.0000 mg | MEDICATED_PATCH | Freq: Every day | TRANSDERMAL | Status: DC
  Administered 2017-10-06 – 2017-10-08 (×3): 7 mg via TRANSDERMAL
  Filled 2017-10-05 (×3): qty 1

## 2017-10-05 MED ORDER — FENTANYL CITRATE (PF) 100 MCG/2ML IJ SOLN
50.0000 ug | Freq: Once | INTRAMUSCULAR | Status: AC
  Administered 2017-10-05: 50 ug via INTRAVENOUS

## 2017-10-05 MED ORDER — METFORMIN HCL 850 MG PO TABS
850.0000 mg | ORAL_TABLET | Freq: Two times a day (BID) | ORAL | Status: DC
  Filled 2017-10-05: qty 1

## 2017-10-05 MED ORDER — DEXAMETHASONE SODIUM PHOSPHATE 10 MG/ML IJ SOLN
INTRAMUSCULAR | Status: AC
  Filled 2017-10-05: qty 1

## 2017-10-05 MED ORDER — ONDANSETRON HCL 4 MG PO TABS: 4.0000 mg | ORAL_TABLET | Freq: Four times a day (QID) | ORAL | Status: DC | PRN

## 2017-10-05 MED ORDER — SODIUM CHLORIDE 0.9 % IV SOLN
INTRAVENOUS | Status: DC | PRN
  Administered 2017-10-05: 60 ug/min via INTRAVENOUS

## 2017-10-05 MED ORDER — FENTANYL CITRATE (PF) 100 MCG/2ML IJ SOLN
INTRAMUSCULAR | Status: DC | PRN
  Administered 2017-10-05: 75 ug via INTRAVENOUS

## 2017-10-05 MED ORDER — INSULIN GLARGINE 100 UNIT/ML ~~LOC~~ SOLN
20.0000 [IU] | Freq: Every day | SUBCUTANEOUS | Status: DC
  Filled 2017-10-05: qty 0.2

## 2017-10-05 MED ORDER — CEFAZOLIN SODIUM-DEXTROSE 2-4 GM/100ML-% IV SOLN: 2.0000 g | Freq: Three times a day (TID) | INTRAVENOUS | Status: DC

## 2017-10-05 MED ORDER — DEXTROSE 50 % IV SOLN
INTRAVENOUS | Status: DC | PRN
  Administered 2017-10-05: 6.25 g via INTRAVENOUS

## 2017-10-05 MED ORDER — ROPIVACAINE HCL 5 MG/ML IJ SOLN
INTRAMUSCULAR | Status: DC | PRN
  Administered 2017-10-05: 30 mL via PERINEURAL
  Administered 2017-10-05: 20 mL via PERINEURAL

## 2017-10-05 MED ORDER — ROCURONIUM BROMIDE 100 MG/10ML IV SOLN
INTRAVENOUS | Status: DC | PRN
  Administered 2017-10-05: 40 mg via INTRAVENOUS

## 2017-10-05 MED ORDER — ACETAMINOPHEN 325 MG PO TABS: 325.0000 mg | ORAL_TABLET | Freq: Four times a day (QID) | ORAL | Status: DC | PRN

## 2017-10-05 MED ORDER — CEFAZOLIN SODIUM-DEXTROSE 2-4 GM/100ML-% IV SOLN
2.0000 g | Freq: Four times a day (QID) | INTRAVENOUS | Status: DC
  Administered 2017-10-05: 2 g via INTRAVENOUS

## 2017-10-05 MED ORDER — PHENYLEPHRINE 40 MCG/ML (10ML) SYRINGE FOR IV PUSH (FOR BLOOD PRESSURE SUPPORT)
PREFILLED_SYRINGE | INTRAVENOUS | Status: AC
  Filled 2017-10-05: qty 10

## 2017-10-05 MED ORDER — METOPROLOL SUCCINATE ER 50 MG PO TB24
50.0000 mg | ORAL_TABLET | Freq: Every day | ORAL | Status: DC
  Filled 2017-10-05: qty 1

## 2017-10-05 MED ORDER — DEXTROSE 50 % IV SOLN
1.0000 | Freq: Once | INTRAVENOUS | Status: AC
  Administered 2017-10-05: 50 mL via INTRAVENOUS

## 2017-10-05 MED ORDER — FENTANYL CITRATE (PF) 100 MCG/2ML IJ SOLN
INTRAMUSCULAR | Status: AC
  Administered 2017-10-05: 50 ug via INTRAVENOUS
  Filled 2017-10-05: qty 2

## 2017-10-05 MED ORDER — FLUTICASONE PROPIONATE 50 MCG/ACT NA SUSP
2.0000 | Freq: Every day | NASAL | Status: DC
  Administered 2017-10-06 – 2017-10-08 (×3): 2 via NASAL
  Filled 2017-10-05: qty 16

## 2017-10-05 MED ORDER — OXYCODONE HCL 5 MG PO TABS
5.0000 mg | ORAL_TABLET | ORAL | Status: DC | PRN
  Administered 2017-10-06 – 2017-10-07 (×3): 10 mg via ORAL
  Filled 2017-10-05 (×3): qty 2

## 2017-10-05 MED ORDER — GUAIFENESIN ER 600 MG PO TB12
1200.0000 mg | ORAL_TABLET | Freq: Two times a day (BID) | ORAL | Status: DC
  Administered 2017-10-06 – 2017-10-08 (×6): 1200 mg via ORAL
  Filled 2017-10-05 (×6): qty 2

## 2017-10-05 MED ORDER — OXYCODONE HCL 5 MG PO TABS: 10.0000 mg | ORAL_TABLET | ORAL | Status: DC | PRN

## 2017-10-05 MED ORDER — METOCLOPRAMIDE HCL 5 MG/ML IJ SOLN: 10.0000 mg | Freq: Once | INTRAMUSCULAR | Status: DC | PRN

## 2017-10-05 MED ORDER — PHENYLEPHRINE 40 MCG/ML (10ML) SYRINGE FOR IV PUSH (FOR BLOOD PRESSURE SUPPORT)
PREFILLED_SYRINGE | INTRAVENOUS | Status: DC | PRN
  Administered 2017-10-05 (×2): 80 ug via INTRAVENOUS

## 2017-10-05 MED ORDER — PROPOFOL 10 MG/ML IV BOLUS
INTRAVENOUS | Status: AC
  Filled 2017-10-05: qty 20

## 2017-10-05 MED ORDER — FENTANYL CITRATE (PF) 100 MCG/2ML IJ SOLN
50.0000 ug | INTRAMUSCULAR | Status: DC | PRN
  Administered 2017-10-05: 50 ug via INTRAVENOUS
  Filled 2017-10-05: qty 2

## 2017-10-05 MED ORDER — MIDAZOLAM HCL 2 MG/2ML IJ SOLN
INTRAMUSCULAR | Status: AC
  Filled 2017-10-05: qty 2

## 2017-10-05 MED ORDER — SUCCINYLCHOLINE CHLORIDE 20 MG/ML IJ SOLN
INTRAMUSCULAR | Status: DC | PRN
  Administered 2017-10-05: 100 mg via INTRAVENOUS

## 2017-10-05 MED ORDER — LIDOCAINE 2% (20 MG/ML) 5 ML SYRINGE
INTRAMUSCULAR | Status: DC | PRN
  Administered 2017-10-05: 100 mg via INTRAVENOUS

## 2017-10-05 MED ORDER — ONDANSETRON HCL 4 MG/2ML IJ SOLN
4.0000 mg | Freq: Once | INTRAMUSCULAR | Status: AC
  Administered 2017-10-05: 4 mg via INTRAVENOUS
  Filled 2017-10-05: qty 2

## 2017-10-05 MED ORDER — B COMPLEX PO TABS: 1.0000 | ORAL_TABLET | Freq: Every day | ORAL | Status: DC

## 2017-10-05 MED ORDER — HYDROCORTISONE 1 % EX CREA
1.0000 "application " | TOPICAL_CREAM | Freq: Every day | CUTANEOUS | Status: DC | PRN
  Filled 2017-10-05: qty 28

## 2017-10-05 MED ORDER — ONDANSETRON HCL 4 MG/2ML IJ SOLN
INTRAMUSCULAR | Status: AC
  Filled 2017-10-05: qty 2

## 2017-10-05 MED ORDER — ENOXAPARIN SODIUM 40 MG/0.4ML ~~LOC~~ SOLN: 40.0000 mg | SUBCUTANEOUS | Status: DC

## 2017-10-05 MED ORDER — FENTANYL CITRATE (PF) 250 MCG/5ML IJ SOLN
INTRAMUSCULAR | Status: AC
Start: 2017-10-05 — End: ?
  Filled 2017-10-05: qty 5

## 2017-10-05 MED ORDER — SUGAMMADEX SODIUM 200 MG/2ML IV SOLN
INTRAVENOUS | Status: AC
  Filled 2017-10-05: qty 2

## 2017-10-05 MED ORDER — POLYETHYLENE GLYCOL 3350 17 GM/SCOOP PO POWD: 1.0000 | Freq: Once | ORAL | Status: DC

## 2017-10-05 MED ORDER — INSULIN ASPART 100 UNIT/ML ~~LOC~~ SOLN: 0.0000 [IU] | Freq: Every day | SUBCUTANEOUS | Status: DC

## 2017-10-05 MED ORDER — INSULIN ASPART 100 UNIT/ML ~~LOC~~ SOLN
4.0000 [IU] | Freq: Three times a day (TID) | SUBCUTANEOUS | Status: DC
  Administered 2017-10-06: 4 [IU] via SUBCUTANEOUS

## 2017-10-05 MED ORDER — PROPOFOL 10 MG/ML IV BOLUS
INTRAVENOUS | Status: DC | PRN
  Administered 2017-10-05: 130 mg via INTRAVENOUS

## 2017-10-05 MED ORDER — FENTANYL CITRATE (PF) 100 MCG/2ML IJ SOLN: 25.0000 ug | INTRAMUSCULAR | Status: DC | PRN

## 2017-10-05 MED ORDER — DORZOLAMIDE HCL 2 % OP SOLN
1.0000 [drp] | Freq: Two times a day (BID) | OPHTHALMIC | Status: DC
  Administered 2017-10-05 – 2017-10-08 (×6): 1 [drp] via OPHTHALMIC
  Filled 2017-10-05: qty 10

## 2017-10-05 MED ORDER — INSULIN ASPART 100 UNIT/ML ~~LOC~~ SOLN
0.0000 [IU] | Freq: Three times a day (TID) | SUBCUTANEOUS | Status: DC
  Administered 2017-10-06: 8 [IU] via SUBCUTANEOUS

## 2017-10-05 MED ORDER — METOCLOPRAMIDE HCL 5 MG PO TABS: 5.0000 mg | ORAL_TABLET | Freq: Three times a day (TID) | ORAL | Status: DC | PRN

## 2017-10-05 MED ORDER — DEXAMETHASONE SODIUM PHOSPHATE 4 MG/ML IJ SOLN
INTRAMUSCULAR | Status: DC | PRN
  Administered 2017-10-05: 8 mg via INTRAVENOUS

## 2017-10-05 MED ORDER — LOSARTAN POTASSIUM 50 MG PO TABS: 100.0000 mg | ORAL_TABLET | Freq: Every day | ORAL | Status: DC

## 2017-10-05 MED ORDER — SUGAMMADEX SODIUM 500 MG/5ML IV SOLN
INTRAVENOUS | Status: AC
  Filled 2017-10-05: qty 5

## 2017-10-05 MED ORDER — 0.9 % SODIUM CHLORIDE (POUR BTL) OPTIME
TOPICAL | Status: DC | PRN
  Administered 2017-10-05: 1000 mL

## 2017-10-05 MED ORDER — LACTATED RINGERS IV SOLN: INTRAVENOUS | Status: DC

## 2017-10-05 MED ORDER — POLYETHYL GLYCOL-PROPYL GLYCOL 0.4-0.3 % OP GEL: Freq: Two times a day (BID) | OPHTHALMIC | Status: DC

## 2017-10-05 MED ORDER — SUCCINYLCHOLINE CHLORIDE 200 MG/10ML IV SOSY
PREFILLED_SYRINGE | INTRAVENOUS | Status: AC
  Filled 2017-10-05: qty 10

## 2017-10-05 MED ORDER — HYDROCHLOROTHIAZIDE 25 MG PO TABS: 25.0000 mg | ORAL_TABLET | Freq: Every day | ORAL | Status: DC

## 2017-10-05 MED ORDER — CEFAZOLIN SODIUM-DEXTROSE 2-4 GM/100ML-% IV SOLN
2.0000 g | Freq: Three times a day (TID) | INTRAVENOUS | Status: DC
  Administered 2017-10-06 – 2017-10-07 (×6): 2 g via INTRAVENOUS
  Filled 2017-10-05 (×9): qty 100

## 2017-10-05 MED ORDER — LACTATED RINGERS IV SOLN
INTRAVENOUS | Status: DC | PRN
  Administered 2017-10-05 (×2): via INTRAVENOUS

## 2017-10-05 MED ORDER — PANTOPRAZOLE SODIUM 40 MG PO TBEC
40.0000 mg | DELAYED_RELEASE_TABLET | Freq: Every day | ORAL | Status: DC
  Administered 2017-10-06 – 2017-10-08 (×3): 40 mg via ORAL
  Filled 2017-10-05 (×3): qty 1

## 2017-10-05 MED ORDER — MEPERIDINE HCL 50 MG/ML IJ SOLN: 6.2500 mg | INTRAMUSCULAR | Status: DC | PRN

## 2017-10-05 MED ORDER — GABAPENTIN 300 MG PO CAPS
300.0000 mg | ORAL_CAPSULE | Freq: Two times a day (BID) | ORAL | Status: DC
  Administered 2017-10-05 – 2017-10-08 (×5): 300 mg via ORAL
  Filled 2017-10-05 (×5): qty 1

## 2017-10-05 MED ORDER — CEFAZOLIN SODIUM-DEXTROSE 2-4 GM/100ML-% IV SOLN
2.0000 g | INTRAVENOUS | Status: AC
  Administered 2017-10-05: 2 g via INTRAVENOUS
  Filled 2017-10-05: qty 100

## 2017-10-05 MED ORDER — VITAMIN D 1000 UNITS PO TABS
2000.0000 [IU] | ORAL_TABLET | Freq: Every day | ORAL | Status: DC
  Administered 2017-10-06 – 2017-10-08 (×3): 2000 [IU] via ORAL
  Filled 2017-10-05 (×4): qty 2

## 2017-10-05 MED ORDER — METOCLOPRAMIDE HCL 5 MG/ML IJ SOLN: 5.0000 mg | Freq: Three times a day (TID) | INTRAMUSCULAR | Status: DC | PRN

## 2017-10-05 MED ORDER — ONDANSETRON HCL 4 MG/2ML IJ SOLN
4.0000 mg | Freq: Four times a day (QID) | INTRAMUSCULAR | Status: DC | PRN
  Administered 2017-10-08: 4 mg via INTRAVENOUS
  Filled 2017-10-05: qty 2

## 2017-10-05 MED ORDER — SODIUM CHLORIDE 0.45 % IV SOLN
INTRAVENOUS | Status: DC
  Administered 2017-10-05 – 2017-10-07 (×2): via INTRAVENOUS

## 2017-10-05 MED ORDER — VITAMIN B-12 1000 MCG PO TABS
2000.0000 ug | ORAL_TABLET | Freq: Every day | ORAL | Status: DC
  Administered 2017-10-06 – 2017-10-08 (×3): 2000 ug via ORAL
  Filled 2017-10-05 (×3): qty 2

## 2017-10-05 MED ORDER — BUPIVACAINE HCL (PF) 0.25 % IJ SOLN
INTRAMUSCULAR | Status: AC
  Filled 2017-10-05: qty 30

## 2017-10-05 MED ORDER — BUPRENORPHINE HCL-NALOXONE HCL 2-0.5 MG SL SUBL: 1.0000 | SUBLINGUAL_TABLET | Freq: Every day | SUBLINGUAL | Status: DC

## 2017-10-05 MED ORDER — ACETAMINOPHEN 500 MG PO TABS
1000.0000 mg | ORAL_TABLET | Freq: Four times a day (QID) | ORAL | Status: AC
  Administered 2017-10-05 – 2017-10-06 (×3): 1000 mg via ORAL
  Filled 2017-10-05 (×3): qty 2

## 2017-10-05 SURGICAL SUPPLY — 74 items
BAG DECANTER FOR FLEXI CONT (MISCELLANEOUS) ×3 IMPLANT
BANDAGE ACE 4X5 VEL STRL LF (GAUZE/BANDAGES/DRESSINGS) ×3 IMPLANT
BANDAGE ACE 6X5 VEL STRL LF (GAUZE/BANDAGES/DRESSINGS) IMPLANT
BANDAGE ESMARK 6X9 LF (GAUZE/BANDAGES/DRESSINGS) IMPLANT
BIT DRILL 2.5X2.75 QC CALB (BIT) ×2 IMPLANT
BIT DRILL 2.9 CANN QC NONSTRL (BIT) ×2 IMPLANT
BNDG CMPR 9X6 STRL LF SNTH (GAUZE/BANDAGES/DRESSINGS)
BNDG ESMARK 6X9 LF (GAUZE/BANDAGES/DRESSINGS)
BNDG GAUZE ELAST 4 BULKY (GAUZE/BANDAGES/DRESSINGS) ×1 IMPLANT
COVER MAYO STAND STRL (DRAPES) ×3 IMPLANT
COVER SURGICAL LIGHT HANDLE (MISCELLANEOUS) ×3 IMPLANT
CUFF TOURNIQUET SINGLE 34IN LL (TOURNIQUET CUFF) IMPLANT
DRAPE C-ARM 42X72 X-RAY (DRAPES) ×2 IMPLANT
DRAPE C-ARMOR (DRAPES) ×2 IMPLANT
DRAPE HALF SHEET 40X57 (DRAPES) ×3 IMPLANT
DRAPE INCISE IOBAN 66X45 STRL (DRAPES) ×3 IMPLANT
DRAPE U-SHAPE 47X51 STRL (DRAPES) ×3 IMPLANT
DRSG EMULSION OIL 3X3 NADH (GAUZE/BANDAGES/DRESSINGS) ×1 IMPLANT
DRSG PAD ABDOMINAL 8X10 ST (GAUZE/BANDAGES/DRESSINGS) ×8 IMPLANT
DURAPREP 26ML APPLICATOR (WOUND CARE) ×3 IMPLANT
ELECT REM PT RETURN 9FT ADLT (ELECTROSURGICAL) ×3
ELECTRODE REM PT RTRN 9FT ADLT (ELECTROSURGICAL) ×1 IMPLANT
GAUZE SPONGE 4X4 12PLY STRL (GAUZE/BANDAGES/DRESSINGS) ×3 IMPLANT
GAUZE XEROFORM 5X9 LF (GAUZE/BANDAGES/DRESSINGS) ×3 IMPLANT
GLOVE BIO SURGEON STRL SZ7 (GLOVE) ×2 IMPLANT
GLOVE BIOGEL PI IND STRL 8 (GLOVE) ×2 IMPLANT
GLOVE BIOGEL PI INDICATOR 8 (GLOVE) ×4
GLOVE ORTHO TXT STRL SZ7.5 (GLOVE) ×6 IMPLANT
GOWN STRL REUS W/ TWL LRG LVL3 (GOWN DISPOSABLE) ×1 IMPLANT
GOWN STRL REUS W/ TWL XL LVL3 (GOWN DISPOSABLE) ×1 IMPLANT
GOWN STRL REUS W/TWL 2XL LVL3 (GOWN DISPOSABLE) ×3 IMPLANT
GOWN STRL REUS W/TWL LRG LVL3 (GOWN DISPOSABLE) ×3
GOWN STRL REUS W/TWL XL LVL3 (GOWN DISPOSABLE) ×3
HANDPIECE INTERPULSE COAX TIP (DISPOSABLE)
K-WIRE ACE 1.6X6 (WIRE) ×6
KIT BASIN OR (CUSTOM PROCEDURE TRAY) ×3 IMPLANT
KIT TURNOVER KIT B (KITS) ×3 IMPLANT
KWIRE ACE 1.6X6 (WIRE) IMPLANT
MANIFOLD NEPTUNE II (INSTRUMENTS) ×3 IMPLANT
NS IRRIG 1000ML POUR BTL (IV SOLUTION) ×3 IMPLANT
PACK ORTHO EXTREMITY (CUSTOM PROCEDURE TRAY) ×3 IMPLANT
PAD ABD 8X10 STRL (GAUZE/BANDAGES/DRESSINGS) ×6 IMPLANT
PAD ARMBOARD 7.5X6 YLW CONV (MISCELLANEOUS) ×4 IMPLANT
PAD CAST 4YDX4 CTTN HI CHSV (CAST SUPPLIES) ×1 IMPLANT
PADDING CAST COTTON 4X4 STRL (CAST SUPPLIES) ×3
PADDING CAST COTTON 6X4 STRL (CAST SUPPLIES) ×3 IMPLANT
PLATE LOCK 8H 103 BILAT FIB (Plate) ×2 IMPLANT
SCREW ACE CAN 4.0 36M (Screw) ×2 IMPLANT
SCREW ACE CAN 4.0 40M (Screw) ×2 IMPLANT
SCREW LOCK CORT STAR 3.5X12 (Screw) ×4 IMPLANT
SCREW LOW PROFILE 12MMX3.5MM (Screw) ×4 IMPLANT
SCREW LP 3.5X60MM (Screw) ×2 IMPLANT
SCREW NON LOCKING LP 3.5 16MM (Screw) ×6 IMPLANT
SCRUB POVIDONE IODINE 4 OZ (MISCELLANEOUS) ×2 IMPLANT
SET HNDPC FAN SPRY TIP SCT (DISPOSABLE) IMPLANT
SOL PREP POV-IOD 4OZ 10% (MISCELLANEOUS) ×2 IMPLANT
SPLINT FIBERGLASS 4X30 (CAST SUPPLIES) ×4 IMPLANT
SPONGE LAP 18X18 X RAY DECT (DISPOSABLE) ×3 IMPLANT
SPONGE LAP 4X18 RFD (DISPOSABLE) ×3 IMPLANT
STAPLER VISISTAT 35W (STAPLE) ×2 IMPLANT
STOCKINETTE IMPERVIOUS 9X36 MD (GAUZE/BANDAGES/DRESSINGS) ×3 IMPLANT
SUCTION FRAZIER HANDLE 10FR (MISCELLANEOUS) ×2
SUCTION TUBE FRAZIER 10FR DISP (MISCELLANEOUS) ×1 IMPLANT
SUT ETHILON 3 0 PS 1 (SUTURE) ×6 IMPLANT
SUT ETHILON 4 0 PS 2 18 (SUTURE) ×12 IMPLANT
SUT VIC AB 2-0 CT1 27 (SUTURE) ×6
SUT VIC AB 2-0 CT1 TAPERPNT 27 (SUTURE) ×2 IMPLANT
SWAB CULTURE ESWAB REG 1ML (MISCELLANEOUS) IMPLANT
SYR CONTROL 10ML LL (SYRINGE) ×3 IMPLANT
TOWEL OR 17X26 10 PK STRL BLUE (TOWEL DISPOSABLE) ×3 IMPLANT
TUBE CONNECTING 12'X1/4 (SUCTIONS) ×1
TUBE CONNECTING 12X1/4 (SUCTIONS) ×2 IMPLANT
UNDERPAD 30X30 (UNDERPADS AND DIAPERS) ×3 IMPLANT
YANKAUER SUCT BULB TIP NO VENT (SUCTIONS) ×3 IMPLANT

## 2017-10-05 NOTE — Anesthesia Postprocedure Evaluation (Signed)
Anesthesia Post Note  Patient: Jason Woods  Procedure(s) Performed: IRRIGATION AND DEBRIDEMENT QPEN ANKLE FRACTURE (Left Ankle) OPEN REDUCTION INTERNAL FIXATION TRIMALLEOLAR (ORIF)ANKLE FRACTURE (Left Ankle)     Patient location during evaluation: PACU Anesthesia Type: General and Regional Level of consciousness: awake and alert Pain management: pain level controlled Vital Signs Assessment: post-procedure vital signs reviewed and stable Respiratory status: spontaneous breathing, nonlabored ventilation, respiratory function stable and patient connected to nasal cannula oxygen Cardiovascular status: blood pressure returned to baseline and stable Postop Assessment: no apparent nausea or vomiting Anesthetic complications: no    Last Vitals:  Vitals:   10/05/17 2000 10/05/17 2021  BP:  (!) 116/59  Pulse: 76 75  Resp: 18 18  Temp:  36.8 C  SpO2: 93% (!) 79%    Last Pain:  Vitals:   10/05/17 2021  TempSrc: Oral  PainSc:                  [name]

## 2017-10-05 NOTE — ED Notes (Signed)
Patient transported to X-ray 

## 2017-10-05 NOTE — ED Notes (Signed)
Pt was able to provide a urine specimen.

## 2017-10-05 NOTE — Op Note (Signed)
Preop diagnosis: Grade 2 left open bimalleolar fracture dislocation with syndesmotic disruption.  Postop diagnosis: Grade 2 open left trimalleolar ankle fracture dislocation with syndesmotic disruption  Procedure: Sharp excisional debridement of left open trimalleolar ankle fracture dislocation with debridement of skin subcutaneous tissue and bone.  ORIF bimalleolar fixation of trimalleolar ankle fracture dislocation.  ORIF syndesmosis.  Surgeon: Rodell Perna, MD  Anesthesia: Preoperative block plus general anesthesia  Tourniquet: 350 x 48 minutes  Procedure: 72 year old male with recent treatment for fever bronchitis questionable pneumonia with persistent coughing and dehydration and diarrhea fell while walking suffering a left trimalleolar ankle fracture dislocation which initially appeared to be a bimalleolar ankle fracture dislocation on plain radiograph.  Attempt at reduction by ER staff was unsuccessful with persistent displacement of the ankle fracture dislocation.  Due to his open injury was taken the operating room on emergent basis and underwent washout of his open fracture and ankle stabilization.  After preoperative block induction general anesthesia patient was intubated.  It was noted with suctioning the to be yellow junky material was suctioned out of the ET tube consistent with bacterial bronchitis.  Proximal thigh tourniquet was applied and the leg was initially scrubbed with Betadine scrub brush by me followed by DuraPrep usual extremity sheets drapes timeout procedure Ancef prophylaxis have been given the emergency room 2 g for his open fracture.  Stockinette was applied as well as extremity sheets and drapes.  Initial fracture visualization showed a transverse medial laceration at the level of the medial malleolar fracture with the proximal portion opposed to the skin.  This was extended in an L-shaped fashion distally to expose the tip of the medial malleolus fracture was displaced  copious irrigation and sharp excisional debridement removing 1 to 2 mm of skin subcutaneous tissue sharp excisional debridement of the bone with curetting was performed and copious irrigation.  Lateral incision was then made exposing the comminuted fibular fracture which was 7 cm proximal to the ankle with irrigation reduction and application of an 8 hole composite Biomet locking plate.  Distal screws were placed checked under fluoroscopy unicortical compressing the plate directly against the tip of the lateral malleolus and then fixation proximally was performed with bicortical screw fixation.  Once the plate was down directly on bone some locking screws were placed followed by reduction on the medial side fixation with K wires.  There was some medial cortical dripping and despite the medial cortex appearing to line up there was still lateral displacement of the medial malleolus pushing the talus over.  Pins were removed until they were not across the fracture site the pieces pulled more medial and then the syndesmotic screw was placed across pushing the talus over which also place the medial malleolus over improved position and then the pins were advanced with the medial malleolus in good position reducing the articular portion of the medial malleolus.  Under fluoroscopy on lateral there appeared to be posterior malleolar fracture involving the posterior medial corner.  This was less than 1/5 of the joint and did not require fixation.  Pins were redirected across the medial malleolus measured and a 35 and 40 mm cannulated lag screw was placed holding fixation of medial malleolus securely.  Syndesmosis was tightened down securely checked under fluoroscopy with good closure of the medial clear space and good reduction of the ankle joint.  Repeat irrigation was performed tourniquet was deflated.  There was good hemostasis.  2-0 Vicryl was used to reapproximate the subcutaneous tissue skin staple closure  postoperative  dressing with short leg splint was applied.  Patient tolerated the procedure well and syndesmotic screw was placed with ankle and dorsiflexion.  Fibula was reduced posterior lateral end of the groove with good position.  Patient tolerated the procedure well was transferred to recovery room in stable condition.  Medical consultation will be obtained for his likely bacterial bronchitis.

## 2017-10-05 NOTE — H&P (Signed)
History and PE for admission for left open ankle fx, dislocation  Reason for Consult:Open ankle fx Referring Physician: [name]  Jason Woods is an 72 y.o. male.  HPI: Jason Woods was walking down his hallway when he got weak and fell. He had immediate left ankle pain and deformity. He has been feeling sick the last several days with nausea and generalized weakness. He was brought to the ED where x-rays confirmed the open ankle fx and orthopedic surgery was consulted.      Past Medical History:  Diagnosis Date  . Diabetes ([place])   . Diabetes ([place]) 03/09/2017  . GERD (gastroesophageal reflux disease)   . High cholesterol   . History of kidney stones   . Hypertension   . Legally blind   . Vitamin B deficiency 03/09/2017         Past Surgical History:  Procedure Laterality Date  . BIOPSY  04/23/2017   Procedure: BIOPSY;  Surgeon: Rogene Houston, MD;  [facility];  Service: Endoscopy;;  gastric  . ESOPHAGEAL DILATION N/A 04/23/2017   Procedure: ESOPHAGEAL DILATION;  Surgeon: Rogene Houston, MD;  [facility];  Service: Endoscopy;  Laterality: N/A;  . ESOPHAGOGASTRODUODENOSCOPY N/A 04/23/2017   Procedure: ESOPHAGOGASTRODUODENOSCOPY (EGD);  Surgeon: Rogene Houston, MD;  [facility];  Service: Endoscopy;  Laterality: N/A;  9:55-moved to 1040 per Lelon Frohlich  . left hand surgery     wrist fx about 5-6 months ago (2018)         Family History  Problem Relation Age of Onset  . Diabetes Mother   . Heart attack Father   . Diabetes Sister   . Diabetes Brother     Social History:  reports that he has been smoking cigarettes.  He has been smoking about 0.50 packs per day. He has never used smokeless tobacco. He reports that he does not drink alcohol or use drugs.  Allergies:       Allergies  Allergen Reactions  . Celexa [Citalopram Hydrobromide]     Nausea,blurred vision  . Crestor [Rosuvastatin]     Pain, aching muscles   . Lipitor [Atorvastatin]     Aching, MS  . Mobic [Meloxicam]     nausea  . Prozac [Fluoxetine Hcl]     fatigue  . Zocor [Simvastatin-High Dose]     MS aches    Medications: I have reviewed the patient's current medications.  LabResultsLast48Hours  Results for orders placed or performed during the hospital encounter of 10/05/17 (from the past 48 hour(s))  CBC with Differential     Status: Abnormal   Collection Time: 10/05/17 12:24 PM  Result Value Ref Range   WBC 14.6 (H) 4.0 - 10.5 K/uL   RBC 3.65 (L) 4.22 - 5.81 MIL/uL   Hemoglobin 10.5 (L) 13.0 - 17.0 g/dL   HCT 32.8 (L) 39.0 - 52.0 %   MCV 89.9 78.0 - 100.0 fL   MCH 28.8 26.0 - 34.0 pg   MCHC 32.0 30.0 - 36.0 g/dL   RDW 13.8 11.5 - 15.5 %   Platelets 187 150 - 400 K/uL   Neutrophils Relative % 84 %   Neutro Abs 12.2 (H) 1.7 - 7.7 K/uL   Lymphocytes Relative 7 %   Lymphs Abs 1.0 0.7 - 4.0 K/uL   Monocytes Relative 8 %   Monocytes Absolute 1.1 (H) 0.1 - 1.0 K/uL   Eosinophils Relative 0 %   Eosinophils Absolute 0.0 0.0 - 0.7 K/uL  Basophils Relative 0 %   Basophils Absolute 0.1 0.0 - 0.1 K/uL   Immature Granulocytes 1 %   Abs Immature Granulocytes 0.2 (H) 0.0 - 0.1 K/uL    Comment: Performed at [facility] [address], [place], [place] [address]       ImagingResults(Last48hours)  Dg Tibia/fibula Left  Result Date: 10/05/2017 CLINICAL DATA:  Fall today.  Left lower leg deformity. EXAM: LEFT TIBIA AND FIBULA - 2 VIEW COMPARISON:  Foot radiographs 08/13/2016. FINDINGS: There is a fracture dislocation at the ankle, further described on the ankle radiographs. There is lateral dislocation of the talus and laterally displaced fractures of the medial malleolus and distal fibula. No evidence of acute proximal injury in the lower leg. There is soft tissue emphysema in the distal lower leg. Diffuse vascular calcifications are noted. IMPRESSION: Fracture dislocation at  the left ankle, further described on ankle radiographs. Electronically Signed   By: Richardean Sale M.D.   On: 10/05/2017 13:15   Dg Ankle 2 Views Left  Result Date: 10/05/2017 CLINICAL DATA:  Fall today.  Lower leg deformity. EXAM: LEFT ANKLE - 2 VIEW COMPARISON:  Foot radiographs 08/14/2014 FINDINGS: The ankle is splinted. There is a fracture dislocation at the left ankle. The medial malleolus is displaced laterally by 3.6 cm. There is a laterally and proximally displaced fracture of the distal fibular diaphysis. This fracture is comminuted with moderate apex medial angulation. There is lateral dislocation of the talus. The talar dome appears intact. No tarsal bone fractures are seen. Prominent vascular calcifications are present. IMPRESSION: Lateral fracture dislocation at the left ankle as described. Electronically Signed   By: Richardean Sale M.D.   On: 10/05/2017 13:17   Dg Chest Portable 1 View  Result Date: 10/05/2017 CLINICAL DATA:  Open fracture of the left lower leg EXAM: PORTABLE CHEST 1 VIEW COMPARISON:  06/04/2017 FINDINGS: There is mild bilateral lower lobe interstitial prominence, right greater than left. There is no pleural effusion or pneumothorax. The heart and mediastinal contours are unremarkable. The osseous structures are unremarkable. IMPRESSION: 1. Mild bilateral lower lobe interstitial prominence, right greater than left, may reflect mild interstitial edema versus infection. Electronically Signed   By: Kathreen Devoid   On: 10/05/2017 13:59   Dg Foot 2 Views Left  Result Date: 10/05/2017 CLINICAL DATA:  Fall today left ankle deformity. EXAM: LEFT FOOT - 2 VIEW COMPARISON:  08/14/2014 foot radiographs. FINDINGS: Lateral fracture dislocation again noted at the ankle. No evidence of tarsal bone fracture. Mild degenerative changes are present in the forefoot. The bones appear mildly demineralized. There are prominent vascular calcifications consistent with underlying diabetes.  IMPRESSION: No acute osseous findings in the left foot. Lateral fracture dislocation at the ankle. Electronically Signed   By: Richardean Sale M.D.   On: 10/05/2017 13:18     Review of Systems  Constitutional: Negative for weight loss.  HENT: Negative for ear discharge, ear pain, hearing loss and tinnitus.   Eyes: Negative for blurred vision, double vision, photophobia and pain.  Respiratory: Negative for cough, sputum production and shortness of breath.   Cardiovascular: Negative for chest pain.  Gastrointestinal: Positive for nausea. Negative for abdominal pain and vomiting.  Genitourinary: Negative for dysuria, flank pain, frequency and urgency.  Musculoskeletal: Positive for joint pain (Left ankle). Negative for back pain, falls, myalgias and neck pain.  Neurological: Positive for weakness. Negative for dizziness, tingling, sensory change, focal weakness, loss of consciousness and headaches.  Endo/Heme/Allergies: Does not bruise/bleed easily.  Psychiatric/Behavioral: Negative for depression, memory loss and substance abuse. The patient is not nervous/anxious.    Blood pressure 132/66, pulse 84, temperature 98.4 F (36.9 C), temperature source Oral, resp. rate 14, height 5\' 7"  (1.702 m), weight 80.3 kg (177 lb), SpO2 98 %. Physical Exam  Constitutional: He appears well-developed and well-nourished. No distress.  HENT:  Head: Normocephalic and atraumatic.  Eyes: Conjunctivae are normal. Right eye exhibits no discharge. Left eye exhibits no discharge. No scleral icterus.  Neck: Normal range of motion.  Cardiovascular: Normal rate and regular rhythm.  Respiratory: Effort normal. No respiratory distress.  Musculoskeletal:  LLE     No ecchymosis or rash             3cm transverse laceration over medial malleolus, ankle deformity, TTP             No knee effusion             Knee stable to varus/ valgus and anterior/posterior stress             Sens DPN, TN intact, SPN paresthetic              Motor EHL, ext, flex, evers 5/5             DP 2+  Neurological: He is alert.  Skin: Skin is warm and dry. He is not diaphoretic.  Psychiatric: He has a normal mood and affect. His behavior is normal.    Assessment/Plan: Open left ankle fx -- Will reduce in ED and splint then plan on I&D, ORIF this afternoon with Dr. Lorin Mercy. NPO until then. DM HTN HLD  Will ask IM to admit given comorbidities and acute illness.     Lisette Abu, PA-C Orthopedic Surgery [phone] 10/05/2017, 2:13 PM           Cosigned by: [name], MD at 10/05/2017 5:33 PM  Electronically signed by Lisette Abu, PA-C at 10/05/2017 2:17 PM Electronically signed by [name], MD at 10/05/2017 5:33 PM

## 2017-10-05 NOTE — Consult Note (Signed)
Reason for Consult:Open ankle fx Referring Physician: J Woods  Teran JAQWAN Jason Woods is an 72 y.o. male.  HPI: Jason Woods was walking down his hallway when he got weak and fell. He had immediate left ankle pain and deformity. He has been feeling sick the last several days with nausea and generalized weakness. He was brought to the ED where x-rays confirmed the open ankle fx and orthopedic surgery was consulted.  Past Medical History:  Diagnosis Date  . Diabetes ([place] Woods)   . Diabetes ([place] Woods) 03/09/2017  . GERD (gastroesophageal reflux disease)   . High cholesterol   . History of kidney stones   . Hypertension   . Legally blind   . Vitamin B deficiency 03/09/2017    Past Surgical History:  Procedure Laterality Date  . BIOPSY  04/23/2017   Procedure: BIOPSY;  Surgeon: Rogene Houston, MD;  [facility];  Service: Endoscopy;;  gastric  . ESOPHAGEAL DILATION N/A 04/23/2017   Procedure: ESOPHAGEAL DILATION;  Surgeon: Rogene Houston, MD;  [facility];  Service: Endoscopy;  Laterality: N/A;  . ESOPHAGOGASTRODUODENOSCOPY N/A 04/23/2017   Procedure: ESOPHAGOGASTRODUODENOSCOPY (EGD);  Surgeon: Rogene Houston, MD;  [facility];  Service: Endoscopy;  Laterality: N/A;  9:55-moved to 1040 per Lelon Frohlich  . left hand surgery     wrist fx about 5-6 months ago (2018)    Family History  Problem Relation Age of Onset  . Diabetes Mother   . Heart attack Father   . Diabetes Sister   . Diabetes Brother     Social History:  reports that he has been smoking cigarettes.  He has been smoking about 0.50 packs per day. He has never used smokeless tobacco. He reports that he does not drink alcohol or use drugs.  Allergies:  Allergies  Allergen Reactions  . Celexa [Citalopram Hydrobromide]     Nausea,blurred vision  . Crestor [Rosuvastatin]     Pain, aching muscles  . Lipitor [Atorvastatin]     Aching, MS  . Mobic [Meloxicam]     nausea  . Prozac [Fluoxetine Hcl]     fatigue  .  Zocor [Simvastatin-High Dose]     MS aches    Medications: I have reviewed the patient's current medications.  Results for orders placed or performed during the hospital encounter of 10/05/17 (from the past 48 hour(s))  CBC with Differential     Status: Abnormal   Collection Time: 10/05/17 12:24 PM  Result Value Ref Range   WBC 14.6 (H) 4.0 - 10.5 K/uL   RBC 3.65 (L) 4.22 - 5.81 MIL/uL   Hemoglobin 10.5 (L) 13.0 - 17.0 g/dL   HCT 32.8 (L) 39.0 - 52.0 %   MCV 89.9 78.0 - 100.0 fL   MCH 28.8 26.0 - 34.0 pg   MCHC 32.0 30.0 - 36.0 g/dL   RDW 13.8 11.5 - 15.5 %   Platelets 187 150 - 400 K/uL   Neutrophils Relative % 84 %   Neutro Abs 12.2 (H) 1.7 - 7.7 K/uL   Lymphocytes Relative 7 %   Lymphs Abs 1.0 0.7 - 4.0 K/uL   Monocytes Relative 8 %   Monocytes Absolute 1.1 (H) 0.1 - 1.0 K/uL   Eosinophils Relative 0 %   Eosinophils Absolute 0.0 0.0 - 0.7 K/uL   Basophils Relative 0 %   Basophils Absolute 0.1 0.0 - 0.1 K/uL   Immature Granulocytes 1 %   Abs Immature Granulocytes 0.2 (H) 0.0 - 0.1 K/uL  Comment: Performed at [facility], [place] [address], [place], [place] [address]    Dg Tibia/fibula Left  Result Date: 10/05/2017 CLINICAL DATA:  Fall today.  Left lower leg deformity. EXAM: LEFT TIBIA AND FIBULA - 2 VIEW COMPARISON:  Foot radiographs 08/13/2016. FINDINGS: There is a fracture dislocation at the ankle, further described on the ankle radiographs. There is lateral dislocation of the talus and laterally displaced fractures of the medial malleolus and distal fibula. No evidence of acute proximal injury in the lower leg. There is soft tissue emphysema in the distal lower leg. Diffuse vascular calcifications are noted. IMPRESSION: Fracture dislocation at the left ankle, further described on ankle radiographs. Electronically Signed   By: Richardean Sale M.D.   On: 10/05/2017 13:15   Dg Ankle 2 Views Left  Result Date: 10/05/2017 CLINICAL DATA:  Fall today.  Lower leg deformity.  EXAM: LEFT ANKLE - 2 VIEW COMPARISON:  Foot radiographs 08/14/2014 FINDINGS: The ankle is splinted. There is a fracture dislocation at the left ankle. The medial malleolus is displaced laterally by 3.6 cm. There is a laterally and proximally displaced fracture of the distal fibular diaphysis. This fracture is comminuted with moderate apex medial angulation. There is lateral dislocation of the talus. The talar dome appears intact. No tarsal bone fractures are seen. Prominent vascular calcifications are present. IMPRESSION: Lateral fracture dislocation at the left ankle as described. Electronically Signed   By: Richardean Sale M.D.   On: 10/05/2017 13:17   Dg Chest Portable 1 View  Result Date: 10/05/2017 CLINICAL DATA:  Open fracture of the left lower leg EXAM: PORTABLE CHEST 1 VIEW COMPARISON:  06/04/2017 FINDINGS: There is mild bilateral lower lobe interstitial prominence, right greater than left. There is no pleural effusion or pneumothorax. The heart and mediastinal contours are unremarkable. The osseous structures are unremarkable. IMPRESSION: 1. Mild bilateral lower lobe interstitial prominence, right greater than left, may reflect mild interstitial edema versus infection. Electronically Signed   By: Kathreen Devoid   On: 10/05/2017 13:59   Dg Foot 2 Views Left  Result Date: 10/05/2017 CLINICAL DATA:  Fall today left ankle deformity. EXAM: LEFT FOOT - 2 VIEW COMPARISON:  08/14/2014 foot radiographs. FINDINGS: Lateral fracture dislocation again noted at the ankle. No evidence of tarsal bone fracture. Mild degenerative changes are present in the forefoot. The bones appear mildly demineralized. There are prominent vascular calcifications consistent with underlying diabetes. IMPRESSION: No acute osseous findings in the left foot. Lateral fracture dislocation at the ankle. Electronically Signed   By: Richardean Sale M.D.   On: 10/05/2017 13:18    Review of Systems  Constitutional: Negative for weight loss.   HENT: Negative for ear discharge, ear pain, hearing loss and tinnitus.   Eyes: Negative for blurred vision, double vision, photophobia and pain.  Respiratory: Negative for cough, sputum production and shortness of breath.   Cardiovascular: Negative for chest pain.  Gastrointestinal: Positive for nausea. Negative for abdominal pain and vomiting.  Genitourinary: Negative for dysuria, flank pain, frequency and urgency.  Musculoskeletal: Positive for joint pain (Left ankle). Negative for back pain, falls, myalgias and neck pain.  Neurological: Positive for weakness. Negative for dizziness, tingling, sensory change, focal weakness, loss of consciousness and headaches.  Endo/Heme/Allergies: Does not bruise/bleed easily.  Psychiatric/Behavioral: Negative for depression, memory loss and substance abuse. The patient is not nervous/anxious.    Blood pressure 132/66, pulse 84, temperature 98.4 F (36.9 C), temperature source Oral, resp. rate 14, height 5\' 7"  (1.702 m),  weight 80.3 kg (177 lb), SpO2 98 %. Physical Exam  Constitutional: He appears well-developed and well-nourished. No distress.  HENT:  Head: Normocephalic and atraumatic.  Eyes: Conjunctivae are normal. Right eye exhibits no discharge. Left eye exhibits no discharge. No scleral icterus.  Neck: Normal range of motion.  Cardiovascular: Normal rate and regular rhythm.  Respiratory: Effort normal. No respiratory distress.  Musculoskeletal:  LLE No ecchymosis or rash  3cm transverse laceration over medial malleolus, ankle deformity, TTP  No knee effusion  Knee stable to varus/ valgus and anterior/posterior stress  Sens DPN, TN intact, SPN paresthetic  Motor EHL, ext, flex, evers 5/5  DP 2+  Neurological: He is alert.  Skin: Skin is warm and dry. He is not diaphoretic.  Psychiatric: He has a normal mood and affect. His behavior is normal.    Assessment/Plan: Open left ankle fx -- Will reduce in ED and splint then plan on I&D, ORIF  this afternoon with Dr. Lorin Mercy. NPO until then. DM HTN HLD  Will ask IM to admit given comorbidities and acute illness.     Lisette Abu, PA-C Orthopedic Surgery [phone] 10/05/2017, 2:13 PM

## 2017-10-05 NOTE — Progress Notes (Signed)
Orthopedic Tech Progress Note Patient Details:  Jason Woods 1946/01/20 [id number]  Ortho Devices Type of Ortho Device: Ace wrap, Post (short leg) splint, Stirrup splint Ortho Device/Splint Location: lle Ortho Device/Splint Interventions: Application  as ordered by PA Silvestre Gunner Post Interventions Patient Tolerated: Well Instructions Provided: Care of device   [name] 10/05/2017, 2:44 PM

## 2017-10-05 NOTE — ED Notes (Signed)
Orthopedic Provider at bedside. °

## 2017-10-05 NOTE — Anesthesia Procedure Notes (Signed)
Anesthesia Regional Block: Adductor canal block   Pre-Anesthetic Checklist: ,, timeout performed, Correct Patient, Correct Site, Correct Laterality, Correct Procedure, Correct Position, site marked, Risks and benefits discussed,  Surgical consent,  Pre-op evaluation,  At surgeon's request and post-op pain management  Laterality: Left and Lower  Prep: Maximum Sterile Barrier Precautions used, chloraprep       Needles:  Injection technique: Single-shot  Needle Type: Echogenic Stimulator Needle     Needle Length: 10cm      Additional Needles:   Procedures:,,,, ultrasound used (permanent image in chart),,,,  Narrative:  Start time: 10/05/2017 4:39 PM End time: 10/05/2017 4:40 PM Injection made incrementally with aspirations every 5 mL.  Performed by: Personally  Anesthesiologist: Montez Hageman, MD  Additional Notes: Risks, benefits and alternative to block explained extensively.  Patient tolerated procedure well, without complications.

## 2017-10-05 NOTE — H&P (Signed)
Date: 10/05/2017               Patient Name:  Jason Woods MRN: [id number]  DOB: [birth year] Age / Sex: 72 y.o., male   PCP: Glenda Chroman, MD         Medical Service: Internal Medicine Teaching Service         Attending Physician: Dr. [name], MD    First Contact: Dr. Nino Glow, MS Pager: [phone]  Second Contact: Dr. Jari Favre Pager: [phone]       After Hours (After 5p/  First Contact Pager: [phone]  weekends / holidays): Second Contact Pager: [phone]   Chief Complaint: Fall  History of Present Illness: This is a 72 year old male with a PMH of DM, GERD, HLD, HTN, and legally blind presenting after he fractured his lower left ankle after a mechanical fall at home. He had left ankle pain and a deformity. He states that he was walking down the hallway when he became very dizzy, his legs became weak and he fell down to his knees. He denies any LOC or head trauma. He does report generalized weakness for the past 2 days, associated with nausea, vomiting, and diarrhea. He reports a chronic cough, but unclear if worsening. He also states that it is productive at times, and the sputum can be yellow at times. He has a history of DM and he states that his sugars have been very low recently due to the decrease in food intake and emesis. Yesterday morning his glucose was in the low 30's however he still took his insulin. He denies any recent changes in his diet and seems to mostly eat oatmeal. His last A1c was 5.7. His brother was recently diagnosed with pneumonia and the patient had pneumonia in Feb of this year. He denies any recent antibiotic use. He denies any fevers, chills, night sweats, chest pain, or SOB. He does report a history of sinus infections which he uses a nasal spray for.   On the way the ED, he was given 1g Rochepin, 50 mcg fentanyl, and 1.5L of fluid. In the ED, he was hypotensive, BP of 93/53, afebrile, HR 75 and RR of 18. His labs were significant for a  leukocytosis of 14.6, anemia of 10.5, hyponatremia of 134, a Cr of 1.28 and low protein and albumin levels. He was also found to have a glucose of 56 and was given 2 ampules of D50 with a repeat glucose of 96. Urinalysis did not show an infectionl.  He had left foot and ankle x-rays that showed fracture and dislocation at the left ankle with displacement at the medial malleolus and distal fibular diaphysis. The ER staff attempted to reduce the fracture but was unsuccessful. Orthopedics was consulted and they took the patient to the OR on emergent basis for washout of open fracture and ankle stabilization. During the OR procedure there was yellow sputum being suctioned out of the ET tube. Patient was admitted to internal medicine for management of chronic conditions.    Meds:  Current Meds  Medication Sig  . acetaminophen (TYLENOL) 500 MG tablet Take 1,000 mg by mouth every 8 (eight) hours as needed for mild pain or headache.  . ALPRAZolam (XANAX) 1 MG tablet Take 1 mg by mouth 2 (two) times daily.   [name] amLODipine (NORVASC) 5 MG tablet Take 5 mg by mouth daily.  [name] b complex vitamins tablet Take 1 tablet by mouth daily.  . bimatoprost (  LUMIGAN) 0.01 % SOLN Place 1 drop into the right eye at bedtime.  . buprenorphine (SUBUTEX) 8 MG SUBL SL tablet Place 4 mg under the tongue every other day.  . Cholecalciferol (D 2000) 2000 units TABS Take 2,000 Units by mouth daily.  . cyanocobalamin 2000 MCG tablet Take 2,000 mcg by mouth daily.  . dorzolamide (TRUSOPT) 2 % ophthalmic solution Place 1 drop into both eyes 2 (two) times daily.  . fluticasone (FLONASE) 50 MCG/ACT nasal spray Place 2 sprays into both nostrils daily.   [name] gabapentin (NEURONTIN) 300 MG capsule Take 300 mg by mouth 2 (two) times daily.  . hydrochlorothiazide (HYDRODIURIL) 25 MG tablet Take 25 mg by mouth daily.  . hydrocortisone cream 1 % Apply 1 application topically daily as needed for itching (dry skin).  . hydrOXYzine (ATARAX/VISTARIL)  25 MG tablet TAKE 1 TABLET BY MOUTH AT BEDTIME AS NEEDED FOR ITCHING  . Insulin NPH Human, Isophane, (NOVOLIN N [place]) Inject 10-35 Units into the skin 2 (two) times daily. 35 units in am and 10 units in pm  . losartan (COZAAR) 100 MG tablet Take 100 mg by mouth daily.  . metFORMIN (GLUCOPHAGE) 850 MG tablet Take 850 mg by mouth 2 (two) times daily with a meal.  . metoprolol succinate (TOPROL-XL) 50 MG 24 hr tablet Take 50 mg by mouth daily. Take with or immediately following a meal.  . Multiple Vitamin (MULTIVITAMIN WITH MINERALS) TABS tablet Take 1 tablet by mouth daily.  [name] oxyCODONE-acetaminophen (PERCOCET) 7.5-325 MG tablet Take 1 tablet by mouth 2 (two) times daily.   . pantoprazole (PROTONIX) 40 MG tablet Take 40 mg by mouth daily.  [name] Glycol-Propyl Glycol (SYSTANE OP) Apply 2 drops to eye 2 (two) times daily.  . polyethylene glycol powder (GLYCOLAX/MIRALAX) powder MIX 1 PACKET OF POWDER (17 GM) IN 8 OZ OF JUICE OR WATER AND DRINK BY MOUTH DAILY  . triamcinolone cream (KENALOG) 0.1 % APPLY TO THE AFFECTED AREA(S) ONCE DAILY AS NEEDED     Allergies: Allergies as of 10/05/2017 - Review Complete 10/05/2017  Allergen Reaction Noted  . Celexa [citalopram hydrobromide]  03/09/2017  . Crestor [rosuvastatin]  03/09/2017  . Lipitor [atorvastatin]  03/09/2017  . Mobic [meloxicam]  03/09/2017  . Prozac [fluoxetine hcl]  03/09/2017  . Zocor [simvastatin-high dose]  03/09/2017   Past Medical History:  Diagnosis Date  . Diabetes ([place])   . Diabetes ([place]) 03/09/2017  . GERD (gastroesophageal reflux disease)   . High cholesterol   . History of kidney stones   . Hypertension   . Legally blind   . Vitamin B deficiency 03/09/2017    Family History: History of DM, HTN, and heart issues runs in family.   Social History: 1/2-3/4ppd smoker for 60 years, denies EtOH or drug use. Lives with brother and daughter comes over daily and helps with ADLs.   Review of Systems: A complete ROS was  negative except as per HPI.   Physical Exam: Blood pressure (!) 116/59, pulse 75, temperature 98.2 F (36.8 C), temperature source Oral, resp. rate 18, height 5\' 7"  (1.702 m), weight 177 lb (80.3 kg), SpO2 (!) 79 %. Physical Exam  Constitutional: He is oriented to person, place, and time.  Well developed, tired appearing, in mild distress  HENT:  Head: Normocephalic and atraumatic.  Eyes: Conjunctivae and lids are normal.  Neck: Trachea normal and normal range of motion. No tracheal tenderness present. No thyroid mass present.  Cardiovascular: Normal rate, regular rhythm, normal heart  sounds and normal pulses.  Pulmonary/Chest: Effort normal and breath sounds normal.  Bilateral rhonchi but only on right side after patient coughed.   Abdominal: Soft. Normal appearance. Bowel sounds are hyperactive. There is no tenderness.  Musculoskeletal:  Left lower extremity was in splint and bandage. Normal ROM in right leg and upper extremities.  Neurological: He is alert and oriented to person, place, and time.  Skin: Skin is warm.  Thin skin, multiple bruises on right and left forearm.   Psychiatric: Mood and affect normal.    EKG: personally reviewed my interpretation is normal sinus rhythm, no ST elevations  CXR: personally reviewed my interpretation is AP portable, poor inspiratory effort, clear costodiaphragmatic angles, increased pulmonary vasculature  Assessment & Plan by Problem: This is a 72 year old male with a PMH of DM, GERD, HLD, HTN, and legally blind presenting after he fractured his lower left ankle after a mechanical fall at home. Orthopedics evaluated and took him to the OR.  #Open left ankle fracture 2/2 fall: Patient suffered a fall at home. He has had nausea, emesis, diarrhea, and generalized weakness for the past 2 days which is consistent with viral gastroenteritis.  Patient has history of diabetes and his blood sugar was 36 yesterday because he wasn't eating, however he  still took his insulin.  His glucose on admission was 56. The fall was likely due to a hypoglycemic event that occurred due to his decreased oral intake after emesis. His x-rays confirned an open ankle fracture.  Orthopedics came to evaluate patient, on their exam they noted 3 cm laceration over medical malleolus, and ankle deformity, they decided to take him directly to the OR for open reduction and internal fixation.  - S/p ORIF - Percocet BID - Ancef 2g q6h for 48 hours - Dilaudid q4 PRN - Oxycodone PRN - Colace - CBC and BMP in AM  #Nausea, vomiting, diarrhea: Patient has been having symptoms for the past 2 days. He has not had any changes to his diet, mostly eating oatmeal, and has had no fevers. On exam he is afebrile, hypotensive, abdomen was soft, mildly distended, and non-tender. His labs showed mild hyponatremia, and leukocytosis of 14.6. He has had no recent antibiotic use. These findings can be suggestive of a viral gastroenteritis. Patient was given zofran and fluids in the ED and seemed to improve.  - Continue zofran PRN - Reglan PRN - Maintenance fluids 75cc/hr - Monitor vitals  #Productive cough: Patient thinks he has been coughing for a couple of months, he states that it may be getting worse but it's unclear. States that he sometimes coughs up yellow sputum. He has a 60 pack year history, and was recently admitted for pneumonia in Feb. On exam he was afebrile, and had some right lung rhonchi. His labs showed a leukocytosis. His CXR showed bilateral lower lobe interstitial prominence which could be mild interstitial edema vs infection, with no consolidation. And during the procedure there was yellow sputum coming out of the ET tube. It seems like this symptom has been going on for awhile and is likely not contributing to the current problem. 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 Other possibilities are post nasal drip, pneumonia, acute bronchitis.   - Continue  maintenence fluids - Guaifenesin - Monitor vitals - Encouraged smoking cessation   #Diabetes Mellitus: Patient was hypoglycemic on admission, with glucose of 56, he was given 2 ampules of D50 and imrpoves to 96. He is on insulin and metformin  at home.  - Continue metformin - CBGs q4 with meals - SSI-moderate - Novolog 4 units TID - Gabapentin  #Hyptertension: Patient was hypotensive on admission, did not improve after 1.5L fluid. He is on losartan, HCTZ, amlodipine, and metoprolol succinate at home.  -Will hold home medications for now -Continue if BP improves  #Legally blind: Uses dorzolamide and latanoprost at home.  -Continue home medications  #GERD:  - Continue home pantoprazole  #Tabacco use: - Nicotine patch  FEN: 75cc/hr, replete lytes prn, carb modified diet  VTE ppx: Lovenox Code Status: FULL   Dispo: Admit patient to Inpatient with expected length of stay greater than 2 midnights.  Signed: Asencion Noble, MD 10/05/2017, 10:58 PM  Pager: [phone]

## 2017-10-05 NOTE — Anesthesia Procedure Notes (Signed)
Anesthesia Regional Block: Popliteal block   Pre-Anesthetic Checklist: ,, timeout performed, Correct Patient, Correct Site, Correct Laterality, Correct Procedure, Correct Position, site marked, Risks and benefits discussed,  Surgical consent,  Pre-op evaluation,  At surgeon's request and post-op pain management  Laterality: Left and Lower  Prep: Maximum Sterile Barrier Precautions used, chloraprep       Needles:  Injection technique: Single-shot  Needle Type: Echogenic Stimulator Needle     Needle Length: 10cm      Additional Needles:   Procedures:,,,, ultrasound used (permanent image in chart),,,,  Narrative:  Start time: 10/05/2017 4:41 PM End time: 10/05/2017 4:42 PM Injection made incrementally with aspirations every 5 mL.  Performed by: Personally  Anesthesiologist: Montez Hageman, MD  Additional Notes: Risks, benefits and alternative to block explained extensively.  Patient tolerated procedure well, without complications.

## 2017-10-05 NOTE — ED Provider Notes (Signed)
Emergency Department Provider Note   I have reviewed the triage vital signs and the nursing notes.   HISTORY  Chief Complaint Fall and Leg Injury   HPI Jason Woods is a 72 y.o. male with medical problems as documented below the presents the emerge department today after a fall.  Patient has been weak lately and fell today sustaining a left lower leg deformity and bleeding.  Came here for further evaluation.  States his been feeling a bit sick lately with some nausea and generalized weakness.  He has vomited a couple times but not persistently.  No cough or fevers.  No diarrhea or constipation.  No chest pain or back pain.  No syncope.  Has had some troubles regulating his blood sugar. No other associated or modifying symptoms.    Past Medical History:  Diagnosis Date  . Diabetes ([place])   . Diabetes ([place]) 03/09/2017  . GERD (gastroesophageal reflux disease)   . High cholesterol   . History of kidney stones   . Hypertension   . Legally blind   . Vitamin B deficiency 03/09/2017    Patient Active Problem List   Diagnosis Date Noted  . PNA (pneumonia) 04/29/2017  . Esophageal dysphagia 03/31/2017  . Abnormal esophagram 03/31/2017  . Diabetes ([place]) 03/09/2017  . Vitamin B deficiency 03/09/2017  . High cholesterol   . Hypertension   . GERD (gastroesophageal reflux disease)     Past Surgical History:  Procedure Laterality Date  . BIOPSY  04/23/2017   Procedure: BIOPSY;  Surgeon: Rogene Houston, MD;  Location: [facility];  Service: Endoscopy;;  gastric  . ESOPHAGEAL DILATION N/A 04/23/2017   Procedure: ESOPHAGEAL DILATION;  Surgeon: Rogene Houston, MD;  Location: [facility];  Service: Endoscopy;  Laterality: N/A;  . ESOPHAGOGASTRODUODENOSCOPY N/A 04/23/2017   Procedure: ESOPHAGOGASTRODUODENOSCOPY (EGD);  Surgeon: Rogene Houston, MD;  Location: [facility];  Service: Endoscopy;  Laterality: N/A;  9:55-moved to 1040 per Lelon Frohlich  . left hand surgery     wrist fx  about 5-6 months ago (2018)    Current Outpatient Rx  . Order #: [id number] Class: Historical Med  . Order #: [id number] Class: Historical Med  . Order #: [id number] Class: Historical Med  . Order #: [id number] Class: Historical Med  . Order #: [id number] Class: Historical Med  . Order #: [id number] Class: Historical Med  . Order #: [id number] Class: Historical Med  . Order #: [id number] Class: Historical Med  . Order #: [id number] Class: Historical Med  . Order #: [id number] Class: Historical Med  . Order #: [id number] Class: Historical Med  . Order #: [id number] Class: Historical Med  . Order #: [id number] Class: Historical Med  . Order #: [id number] Class: Historical Med  . Order #: [id number] Class: Historical Med  . Order #: [id number] Class: Historical Med  . Order #: [id number] Class: Historical Med  . Order #: [id number] Class: Historical Med  . Order #: [id number] Class: Historical Med  . Order #: [id number] Class: Historical Med  . Order #: [id number] Class: Historical Med  . Order #: [id number] Class: Historical Med  . Order #: [id number] Class: Historical Med  . Order #: [id number] Class: Historical Med    Allergies Celexa [citalopram hydrobromide]; Crestor [rosuvastatin]; Lipitor [atorvastatin]; Mobic [meloxicam]; Prozac [fluoxetine hcl]; and Zocor [simvastatin-high dose]  Family History  Problem Relation Age of Onset  . Diabetes Mother   . Heart attack Father   . Diabetes Sister   . Diabetes Brother     Social History Social History   Tobacco Use  . Smoking status: Current Every Day  Smoker    Packs/day: 0.50    Types: Cigarettes  . Smokeless tobacco: Never Used  . Tobacco comment: 1 pack a day x 60 yrs  Substance Use Topics  . Alcohol use: No    Frequency: Never  . Drug use: No    Review of Systems  All other systems negative except as documented in the HPI. All pertinent positives and negatives as reviewed in the HPI. ____________________________________________   PHYSICAL EXAM:  VITAL  SIGNS: ED Triage Vitals  Enc Vitals Group     BP 10/05/17 1207 (!) 96/50     Pulse Rate 10/05/17 1209 75     Resp 10/05/17 1209 18     Temp 10/05/17 1209 98.4 F (36.9 C)     Temp Source 10/05/17 1209 Oral     SpO2 10/05/17 1209 92 %     Weight 10/05/17 1207 177 lb (80.3 kg)     Height 10/05/17 1207 5\' 7"  (1.702 m)     Head Circumference --      Peak Flow --      Pain Score 10/05/17 1206 5     Pain Loc --      Pain Edu? --      Excl. in [place]? --     Constitutional: Alert and oriented. Well appearing and in no acute distress. Eyes: Conjunctivae are normal. PERRL. EOMI. Head: Atraumatic. Nose: No congestion/rhinnorhea. Mouth/Throat: Mucous membranes are moist.  Oropharynx non-erythematous. Neck: No stridor.  No meningeal signs.   Cardiovascular: Normal rate, regular rhythm. Good peripheral circulation. Grossly normal heart sounds.   Respiratory: Normal respiratory effort.  No retractions. Lungs CTAB. Gastrointestinal: Soft and nontender. No distention.  Musculoskeletal: LLE with open wound to medial malleolus area with obvious skin tenting and deformity. NVI distally.  Neurologic:  Normal speech and language. No gross focal neurologic deficits are appreciated.  Skin:  Skin is warm, dry and intact. No rash noted.   ____________________________________________   LABS (all labs ordered are listed, but only abnormal results are displayed)  Labs Reviewed  CBC WITH DIFFERENTIAL/PLATELET - Abnormal; Notable for the following components:      Result Value   WBC 14.6 (*)    RBC 3.65 (*)    Hemoglobin 10.5 (*)    HCT 32.8 (*)    Neutro Abs 12.2 (*)    Monocytes Absolute 1.1 (*)    Abs Immature Granulocytes 0.2 (*)    All other components within normal limits  URINALYSIS, ROUTINE W REFLEX MICROSCOPIC   ____________________________________________  EKG   EKG Interpretation  Date/Time:    Ventricular Rate:    PR Interval:    QRS Duration:   QT Interval:    QTC  Calculation:   R Axis:     Text Interpretation:         ____________________________________________  RADIOLOGY  Dg Tibia/fibula Left  Result Date: 10/05/2017 CLINICAL DATA:  Fall today.  Left lower leg deformity. EXAM: LEFT TIBIA AND FIBULA - 2 VIEW COMPARISON:  Foot radiographs 08/13/2016. FINDINGS: There is a fracture dislocation at the ankle, further described on the ankle radiographs. There is lateral dislocation of the talus and laterally displaced fractures of the medial malleolus and distal fibula. No evidence of acute proximal injury in the lower leg. There is soft tissue emphysema in the distal lower leg. Diffuse vascular calcifications are noted. IMPRESSION: Fracture dislocation at the left ankle, further described on ankle radiographs. Electronically Signed   By: Richardean Sale M.D.   On:  10/05/2017 13:15   Dg Ankle 2 Views Left  Result Date: 10/05/2017 CLINICAL DATA:  Fall today.  Lower leg deformity. EXAM: LEFT ANKLE - 2 VIEW COMPARISON:  Foot radiographs 08/14/2014 FINDINGS: The ankle is splinted. There is a fracture dislocation at the left ankle. The medial malleolus is displaced laterally by 3.6 cm. There is a laterally and proximally displaced fracture of the distal fibular diaphysis. This fracture is comminuted with moderate apex medial angulation. There is lateral dislocation of the talus. The talar dome appears intact. No tarsal bone fractures are seen. Prominent vascular calcifications are present. IMPRESSION: Lateral fracture dislocation at the left ankle as described. Electronically Signed   By: Richardean Sale M.D.   On: 10/05/2017 13:17   Dg Foot 2 Views Left  Result Date: 10/05/2017 CLINICAL DATA:  Fall today left ankle deformity. EXAM: LEFT FOOT - 2 VIEW COMPARISON:  08/14/2014 foot radiographs. FINDINGS: Lateral fracture dislocation again noted at the ankle. No evidence of tarsal bone fracture. Mild degenerative changes are present in the forefoot. The bones appear  mildly demineralized. There are prominent vascular calcifications consistent with underlying diabetes. IMPRESSION: No acute osseous findings in the left foot. Lateral fracture dislocation at the ankle. Electronically Signed   By: Richardean Sale M.D.   On: 10/05/2017 13:18    ____________________________________________   PROCEDURES  Procedure(s) performed:   .Ortho Injury Treatment Date/Time: 10/05/2017 10:54 PM Performed by: Merrily Pew, MD Authorized by: Merrily Pew, MD   Consent:    Consent obtained:  Verbal   Consent given by:  Patient   Risks discussed:  Recurrent dislocation and irreducible dislocation   Alternatives discussed:  No treatmentInjury location: lower leg Location details: left lower leg Injury type: fracture-dislocation Pre-procedure neurovascular assessment: neurovascularly intact Pre-procedure distal perfusion: normal Pre-procedure neurological function: normal Pre-procedure range of motion: reduced  Anesthesia: Local anesthesia used: no  Patient sedated: NoManipulation performed: yes Skin traction used: no Skeletal traction used: yes Reduction successful: yes (improved but not completely reduced) X-ray confirmed reduction: yes Immobilization: splint Splint type: short leg Supplies used: plaster Post-procedure neurovascular assessment: post-procedure neurovascularly intact Post-procedure distal perfusion: normal Post-procedure neurological function: normal Post-procedure range of motion: normal Patient tolerance: Patient tolerated the procedure well with no immediate complications      ____________________________________________   INITIAL IMPRESSION / ASSESSMENT AND PLAN / ED COURSE  Patient with a fall that I suspect is likely related to hypoglycemia as he was hypoglycemic when he got here which improved with D50.  However did have a open left bimalleolar fracture.  This is reduced at bedside by myself as per the note.  Patient had  improved alignment but was very unstable attempted to splint however still did show some fracture dislocation on the repeat x-rays.  Orthopedics to take 2 OR.  Artie had Rocephin with EMS.     Pertinent labs & imaging results that were available during my care of the patient were reviewed by me and considered in my medical decision making (see chart for details).  ____________________________________________  FINAL CLINICAL IMPRESSION(S) / ED DIAGNOSES  Final diagnoses:  Deformity  Injury  Fall     MEDICATIONS GIVEN DURING THIS VISIT:  Medications  fentaNYL (SUBLIMAZE) injection 50 mcg (50 mcg Intravenous Given 10/05/17 1325)     NEW OUTPATIENT MEDICATIONS STARTED DURING THIS VISIT:  New Prescriptions   No medications on file    Note:  This note was prepared with assistance of Dragon voice recognition software. Occasional wrong-word or sound-a-like substitutions may  have occurred due to the inherent limitations of voice recognition software.   Merrily Pew, MD 10/05/17 2259

## 2017-10-05 NOTE — Progress Notes (Signed)
Patient ID: Jason Woods, male   DOB: 1945-08-20, 72 y.o.   MRN: [id number] [facility] for help with pts bronchitis with thick yellow ET tube suctioned during surgery withrecent admission for  Hx of ? Pneumonia in smoker with diabetes recent dehydration with diarrhea .

## 2017-10-05 NOTE — Anesthesia Preprocedure Evaluation (Addendum)
Anesthesia Evaluation  Patient identified by MRN, date of birth, ID band Patient awake    Reviewed: Allergy & Precautions, NPO status , Patient's Chart, lab work & pertinent test results  Airway Mallampati: II  TM Distance: >3 FB Neck ROM: Full    Dental no notable dental hx. (+) Poor Dentition, Chipped, Loose, Dental Advisory Given,    Pulmonary COPD, Current Smoker,    Pulmonary exam normal breath sounds clear to auscultation       Cardiovascular hypertension, Pt. on medications negative cardio ROS Normal cardiovascular exam Rhythm:Regular Rate:Normal     Neuro/Psych negative neurological ROS  negative psych ROS   GI/Hepatic negative GI ROS, Neg liver ROS,   Endo/Other  diabetes, Type 2, Insulin Dependent, Oral Hypoglycemic Agents  Renal/GU negative Renal ROS  negative genitourinary   Musculoskeletal negative musculoskeletal ROS (+)   Abdominal   Peds negative pediatric ROS (+)  Hematology negative hematology ROS (+)   Anesthesia Other Findings   Reproductive/Obstetrics negative OB ROS                            Anesthesia Physical Anesthesia Plan  ASA: III  Anesthesia Plan: General   Post-op Pain Management:  Regional for Post-op pain   Induction: Intravenous  PONV Risk Score and Plan: 1 and Ondansetron  Airway Management Planned: LMA  Additional Equipment:   Intra-op Plan:   Post-operative Plan:   Informed Consent: I have reviewed the patients History and Physical, chart, labs and discussed the procedure including the risks, benefits and alternatives for the proposed anesthesia with the patient or authorized representative who has indicated his/her understanding and acceptance.   Dental advisory given  Plan Discussed with: CRNA  Anesthesia Plan Comments:         Anesthesia Quick Evaluation

## 2017-10-05 NOTE — ED Notes (Signed)
Orhto tech paged

## 2017-10-05 NOTE — ED Notes (Signed)
Patient still in Xray.

## 2017-10-05 NOTE — Anesthesia Procedure Notes (Addendum)
Procedure Name: Intubation Date/Time: 10/05/2017 5:50 PM Performed by: Orlie Dakin, CRNA Pre-anesthesia Checklist: Patient identified, Emergency Drugs available, Suction available, Patient being monitored and Timeout performed Patient Re-evaluated:Patient Re-evaluated prior to induction Oxygen Delivery Method: Circle system utilized Preoxygenation: Pre-oxygenation with 100% oxygen Induction Type: IV induction and Rapid sequence Laryngoscope Size: Miller and 3 Grade View: Grade I Tube type: Oral Tube size: 7.5 mm Number of attempts: 1 Airway Equipment and Method: Stylet Placement Confirmation: ETT inserted through vocal cords under direct vision,  positive ETCO2 and breath sounds checked- equal and bilateral Secured at: 23 cm Tube secured with: Tape Dental Injury: Teeth and Oropharynx as per pre-operative assessment  Comments: In Short-Stay noted very poor dentition, many missing upper and lower front.  Noted very loose portion of upper front left tooth.  Noted thick greenish secretions in posterior pharynx with DL.

## 2017-10-05 NOTE — Transfer of Care (Signed)
Immediate Anesthesia Transfer of Care Note  Patient: Karell Vallery Sa  Procedure(s) Performed: IRRIGATION AND DEBRIDEMENT QPEN ANKLE FRACTURE (Left Ankle) OPEN REDUCTION INTERNAL FIXATION TRIMALLEOLAR (ORIF)ANKLE FRACTURE (Left Ankle)  Patient Location: PACU  Anesthesia Type:General  Level of Consciousness: responds to stimulation  Airway & Oxygen Therapy: Patient Spontanous Breathing and Patient connected to face mask oxygen  Post-op Assessment: Report given to RN and Post -op Vital signs reviewed and stable  Post vital signs: Reviewed and stable  Last Vitals:  Vitals Value Taken Time  BP 112/50 10/05/2017  7:32 PM  Temp    Pulse 73 10/05/2017  7:32 PM  Resp 21 10/05/2017  7:32 PM  SpO2 95 % 10/05/2017  7:32 PM  Vitals shown include unvalidated device data.  Last Pain:  Vitals:   10/05/17 1502  TempSrc:   PainSc: 4          Complications: No apparent anesthesia complications

## 2017-10-05 NOTE — ED Triage Notes (Signed)
Pt form home with ems for mechanical fall, pt tripped in his home. Pt has open fracture to left lower leg, bleeding controlled at this time, leg immobilized. Pt given 1G rocephin IM, 50 mcg fentanyl and 1.25L fluid en route. Bp 93/53 Hr 76 CBG 190. Pt denies any blood thinners, pt a.o upon arrival.

## 2017-10-06 ENCOUNTER — Inpatient Hospital Stay (HOSPITAL_COMMUNITY): Payer: Medicare Other

## 2017-10-06 ENCOUNTER — Encounter (HOSPITAL_COMMUNITY): Payer: Self-pay | Admitting: Orthopaedic Surgery

## 2017-10-06 LAB — GLUCOSE, CAPILLARY
GLUCOSE-CAPILLARY: 295 mg/dL — AB (ref 70–99)
Glucose-Capillary: 211 mg/dL — ABNORMAL HIGH (ref 70–99)
Glucose-Capillary: 202 mg/dL — ABNORMAL HIGH (ref 70–99)
Glucose-Capillary: 252 mg/dL — ABNORMAL HIGH (ref 70–99)

## 2017-10-06 LAB — CBC
HEMATOCRIT: 30.3 % — AB (ref 39.0–52.0)
HEMOGLOBIN: 9.8 g/dL — AB (ref 13.0–17.0)
MCH: 28.3 pg (ref 26.0–34.0)
MCHC: 32.3 g/dL (ref 30.0–36.0)
MCV: 87.6 fL (ref 78.0–100.0)
Platelets: 178 10*3/uL (ref 150–400)
RBC: 3.46 MIL/uL — ABNORMAL LOW (ref 4.22–5.81)
RDW: 13.4 % (ref 11.5–15.5)
WBC: 9.6 10*3/uL (ref 4.0–10.5)

## 2017-10-06 LAB — BASIC METABOLIC PANEL
ANION GAP: 8 (ref 5–15)
ANION GAP: 10 (ref 5–15)
BUN: 18 mg/dL (ref 8–23)
BUN: 14 mg/dL (ref 8–23)
CALCIUM: 7.7 mg/dL — AB (ref 8.9–10.3)
CHLORIDE: 94 mmol/L — AB (ref 98–111)
CO2: 27 mmol/L (ref 22–32)
CO2: 29 mmol/L (ref 22–32)
CREATININE: 1 mg/dL (ref 0.61–1.24)
CREATININE: 1 mg/dL (ref 0.61–1.24)
Calcium: 8.4 mg/dL — ABNORMAL LOW (ref 8.9–10.3)
Chloride: 89 mmol/L — ABNORMAL LOW (ref 98–111)
GFR calc non Af Amer: >60 mL/min (ref >60)
GLUCOSE: 255 mg/dL — AB (ref 70–99)
Glucose, Bld: 295 mg/dL — ABNORMAL HIGH (ref 70–99)
POTASSIUM: 3.5 mmol/L (ref 3.5–5.1)
Potassium: 3.8 mmol/L (ref 3.5–5.1)
SODIUM: 128 mmol/L — AB (ref 135–145)
Sodium: 129 mmol/L — ABNORMAL LOW (ref 135–145)

## 2017-10-06 LAB — MRSA PCR SCREENING: MRSA by PCR: NEGATIVE

## 2017-10-06 MED ORDER — INSULIN NPH (HUMAN) (ISOPHANE) 100 UNIT/ML ~~LOC~~ SUSP
5.0000 [IU] | Freq: Two times a day (BID) | SUBCUTANEOUS | Status: DC
  Administered 2017-10-06 – 2017-10-07 (×3): 5 [IU] via SUBCUTANEOUS
  Filled 2017-10-06: qty 10

## 2017-10-06 MED ORDER — INSULIN ASPART 100 UNIT/ML ~~LOC~~ SOLN
0.0000 [IU] | Freq: Three times a day (TID) | SUBCUTANEOUS | Status: DC
  Administered 2017-10-06 (×2): 3 [IU] via SUBCUTANEOUS
  Administered 2017-10-07 (×2): 2 [IU] via SUBCUTANEOUS
  Administered 2017-10-07: 3 [IU] via SUBCUTANEOUS
  Administered 2017-10-08: 7 [IU] via SUBCUTANEOUS
  Administered 2017-10-08: 2 [IU] via SUBCUTANEOUS

## 2017-10-06 MED ORDER — SENNA 8.6 MG PO TABS
1.0000 | ORAL_TABLET | Freq: Every day | ORAL | Status: DC
  Administered 2017-10-06 – 2017-10-08 (×3): 8.6 mg via ORAL
  Filled 2017-10-06 (×3): qty 1

## 2017-10-06 NOTE — Evaluation (Signed)
Occupational Therapy Evaluation Patient Details Name: Jason Woods MRN: [id number] DOB: 05/03/1945 Today's Date: 10/06/2017    History of Present Illness Pt is a 72 y.o. male who sustained fall at home with L trimalleolar ankle fx; now s/p L ankle ORIF on 10/05/17. PMH includes HTN, DM, legally blind.   Clinical Impression   This 72 y/o male presents with the above. At baseline pt is independent with ADLs and functional mobility. Pt demonstrating functional mobility using RW with minA this session, demonstrating good maintenance of NWB status. Pt currently requires minguard assist for seated UB ADL, modA for LB ADLs. Pt lives alone, reports daughter may be able to stay and assist initially after return home. Pt will benefit from continued acute OT services and recommend follow up [facility] services after discharge to maximize his safety and independence with ADLs and mobility.     Follow Up Recommendations  Home health OT;Supervision/Assistance - 24 hour    Equipment Recommendations  3 in 1 bedside commode;Tub/shower bench           Precautions / Restrictions Precautions Precautions: Fall;Other (comment) Precaution Comments: Legally blind (diabetic retinopathy) Restrictions Weight Bearing Restrictions: Yes LLE Weight Bearing: Non weight bearing      Mobility Bed Mobility               General bed mobility comments: Received sitting in recliner  Transfers Overall transfer level: Needs assistance Equipment used: Rolling walker (2 wheeled) Transfers: Sit to/from Stand Sit to Stand: Min guard         General transfer comment: Min guard for balance. Cues for NWB precautions and correct hand placement; repeated cues to slow down movement    Balance Overall balance assessment: Needs assistance Sitting-balance support: No upper extremity supported;Feet supported;Feet unsupported Sitting balance-Leahy Scale: Good     Standing balance support: Bilateral upper extremity  supported Standing balance-Leahy Scale: Poor Standing balance comment: Reliant on UE support                           ADL either performed or assessed with clinical judgement   ADL Overall ADL's : Needs assistance/impaired Eating/Feeding: Independent;Sitting   Grooming: Set up;Sitting   Upper Body Bathing: Min guard;Sitting   Lower Body Bathing: Minimal assistance;Sit to/from stand   Upper Body Dressing : Sitting;Min guard   Lower Body Dressing: Sit to/from stand;Moderate assistance Lower Body Dressing Details (indicate cue type and reason): educated on compensatory strategies for completing LB ADLs including safety with standing portions of task and option to utilize lateral leans; minA standing balance Toilet Transfer: Minimal assistance;Ambulation;BSC;RW Toilet Transfer Details (indicate cue type and reason): over toilet; simulated in transfer to/from [place] and Hygiene: Minimal assistance;Sit to/from Nurse, children's Details (indicate cue type and reason): educated on option of using tub bench vs sponge bathing at sink due to pt's current NWB status, decreasing pt's ability to safely step into tub  Functional mobility during ADLs: Minimal assistance;Rolling walker General ADL Comments: pt able to complete short distance mobility in room using RW, increased fatigue due to having recently finished working with PT; good maintenance of NWB during mobility      Vision         Perception     Praxis      Pertinent Vitals/Pain Pain Assessment: Faces Faces Pain Scale: Hurts a little bit Pain Location: L ankle Pain Descriptors / Indicators: Guarding Pain Intervention(s): Monitored  during session;Repositioned;Ice applied     Hand Dominance     Extremity/Trunk Assessment Upper Extremity Assessment Upper Extremity Assessment: Overall WFL for tasks assessed   Lower Extremity Assessment Lower Extremity Assessment:  Defer to PT evaluation LLE Deficits / Details: s/p L ankle ORIF; hip and knee at least 4/5  LLE: Unable to fully assess due to immobilization       Communication Communication Communication: No difficulties   Cognition Arousal/Alertness: Awake/alert Behavior During Therapy: WFL for tasks assessed/performed Overall Cognitive Status: No family/caregiver present to determine baseline cognitive functioning Area of Impairment: Safety/judgement;Problem solving;Attention                   Current Attention Level: Selective     Safety/Judgement: Decreased awareness of safety;Decreased awareness of deficits   Problem Solving: Requires verbal cues General Comments: Likely baseline cognition   General Comments       Exercises General Exercises - Lower Extremity Long Arc Quad: AROM;Both;Seated;15 reps Hip Flexion/Marching: AROM;Left;10 reps;Seated   Shoulder Instructions      Home Living Family/patient expects to be discharged to:: Private residence Living Arrangements: Alone Available Help at Discharge: Family;Available PRN/intermittently Type of Home: House Home Access: Ramped entrance     Home Layout: One level     Bathroom Shower/Tub: Teacher, early years/pre: Standard         Additional Comments: Multiple family members nearby. Pt reports daughter may be able to stay with pt a few days at d/c      Prior Functioning/Environment Level of Independence: Independent        Comments: Does not drive due to visual deficits; family drives. Family assists intermittently with household tasks (cleaning)        OT Problem List: Decreased strength;Impaired balance (sitting and/or standing);Decreased activity tolerance;Decreased knowledge of use of DME or AE      OT Treatment/Interventions: Self-care/ADL training;DME and/or AE instruction;Therapeutic activities;Balance training;Therapeutic exercise;Patient/family education    OT Goals(Current goals can be  found in the care plan section) Acute Rehab OT Goals Patient Stated Goal: "Be able to sit on my porch and get out of the house" OT Goal Formulation: With patient Time For Goal Achievement: 10/20/17 Potential to Achieve Goals: Good  OT Frequency: Min 2X/week   Barriers to D/C:            Co-evaluation              AM-PAC PT "6 Clicks" Daily Activity     Outcome Measure Help from another person eating meals?: None Help from another person taking care of personal grooming?: A Little Help from another person toileting, which includes using toliet, bedpan, or urinal?: A Lot Help from another person bathing (including washing, rinsing, drying)?: A Lot Help from another person to put on and taking off regular upper body clothing?: None Help from another person to put on and taking off regular lower body clothing?: A Lot 6 Click Score: 17   End of Session Equipment Utilized During Treatment: Gait belt;Rolling walker Nurse Communication: Mobility status  Activity Tolerance: Patient tolerated treatment well Patient left: in chair;with call bell/phone within reach  OT Visit Diagnosis: Unsteadiness on feet (R26.81);Other abnormalities of gait and mobility (R26.89)                Time: 5784-6962 OT Time Calculation (min): 19 min Charges:  OT General Charges $OT Visit: 1 Visit OT Evaluation $OT Eval Moderate Complexity: 1 Mod G-Codes:     [place]  Megan Salon, [place] Pager [phone] 10/06/2017   Raymondo Band 10/06/2017, 4:58 PM

## 2017-10-06 NOTE — Progress Notes (Signed)
Patient ID: Jason Woods, male   DOB: 1946/03/18, 72 y.o.   MRN: [id number]   Subjective: 1 Day Post-Op Procedure(s) (LRB): IRRIGATION AND DEBRIDEMENT QPEN ANKLE FRACTURE (Left) OPEN REDUCTION INTERNAL FIXATION TRIMALLEOLAR (ORIF)ANKLE FRACTURE (Left) Patient reports pain as 0 on 0-10 scale and 1 on 0-10 scale.    Objective: Vital signs in last 24 hours: Temp:  [97.7 F (36.5 C)-98.2 F (36.8 C)] 97.7 F (36.5 C) (07/17 0517) Pulse Rate:  [72-94] 76 (07/17 0517) Resp:  [14-18] 16 (07/17 0517) BP: (102-121)/(47-97) 118/63 (07/17 0517) SpO2:  [79 %-100 %] 100 % (07/17 0517)  Intake/Output from previous day: 07/16 0701 - 07/17 0700 In: 3549.5 [I.V.:3349.5; IV Piggyback:200] Out: 810 [Urine:790; Blood:20] Intake/Output this shift: Total I/O In: 240 [P.O.:240] Out: -   Recent Labs    10/05/17 1224 10/06/17 0446  HGB 10.5* 9.8*   Recent Labs    10/05/17 1224 10/06/17 0446  WBC 14.6* 9.6  RBC 3.65* 3.46*  HCT 32.8* 30.3*  PLT 187 178   Recent Labs    10/05/17 1342 10/06/17 0446  NA 134* 129*  K 3.6 3.8  CL 97* 94*  CO2 26 27  BUN 22 18  CREATININE 1.28* 1.00  GLUCOSE 56* 255*  CALCIUM 8.4* 7.7*   No results for input(s): LABPT, INR in the last 72 hours.  dressing dry Dg Chest 2 View  Result Date: 10/06/2017 CLINICAL DATA:  COPD EXAM: CHEST - 2 VIEW COMPARISON:  None. FINDINGS: Normal cardiac silhouette. Central venous pulmonary congestion. No infiltrate or pneumothorax. No acute osseous abnormality. IMPRESSION: Central venous pulmonary congestion. Electronically Signed   By: Suzy Bouchard M.D.   On: 10/06/2017 11:08   Dg Ankle 2 Views Left  Result Date: 10/05/2017 CLINICAL DATA:  Post reduction EXAM: LEFT ANKLE - 2 VIEW COMPARISON:  Earlier study 10/05/2017 FINDINGS: Bone detail limited by plaster splint material. Persistent lateral ankle dislocation. Transverse displaced fracture of the medial malleolus. Displaced fracture of the distal fibula. No  additional fractures are identified. IMPRESSION: Persistent lateral ankle dislocation and displaced bimalleolar fractures. Electronically Signed   By: Lavonia Dana M.D.   On: 10/05/2017 16:24   Dg Ankle Complete Left  Result Date: 10/05/2017 CLINICAL DATA:  ORIF of left ankle fracture. EXAM: DG C-ARM 61-120 MIN; LEFT ANKLE COMPLETE - 3+ VIEW COMPARISON:  Preoperative radiographs from 10/05/2017 FINDINGS: Four intraoperative fluoroscopic views of the left ankle are provided after ORIF of bimalleolar fractures. A total 26 seconds of fluoroscopic time was utilized. No immediate intraoperative complications are identified. Improved alignment is seen. IMPRESSION: Fluoroscopic time utilized during ORIF of bimalleolar fractures of the left ankle. No immediate intraoperative complications noted. Electronically Signed   By: Ashley Royalty M.D.   On: 10/05/2017 20:27   Dg C-arm 1-60 Min  Result Date: 10/05/2017 CLINICAL DATA:  ORIF of left ankle fracture. EXAM: DG C-ARM 61-120 MIN; LEFT ANKLE COMPLETE - 3+ VIEW COMPARISON:  Preoperative radiographs from 10/05/2017 FINDINGS: Four intraoperative fluoroscopic views of the left ankle are provided after ORIF of bimalleolar fractures. A total 26 seconds of fluoroscopic time was utilized. No immediate intraoperative complications are identified. Improved alignment is seen. IMPRESSION: Fluoroscopic time utilized during ORIF of bimalleolar fractures of the left ankle. No immediate intraoperative complications noted. Electronically Signed   By: Ashley Royalty M.D.   On: 10/05/2017 20:27    Assessment/Plan: 1 Day Post-Op Procedure(s) (LRB): IRRIGATION AND DEBRIDEMENT QPEN ANKLE FRACTURE (Left) OPEN REDUCTION INTERNAL FIXATION TRIMALLEOLAR (ORIF)ANKLE FRACTURE (Left) Up with  therapy, continue IV Ancef times 48 hrs post op .   Jason Woods 10/06/2017, 2:42 PM

## 2017-10-06 NOTE — Evaluation (Signed)
Physical Therapy Evaluation Patient Details Name: Jason Woods MRN: [id number] DOB: 05-29-1945 Today's Date: 10/06/2017   History of Present Illness  Pt is a 72 y.o. male who sustained fall at home with L trimalleolar ankle fx; now s/p L ankle ORIF on 10/05/17. PMH includes HTN, DM, legally blind.    Clinical Impression  Pt presents with an overall decrease in functional mobility secondary to above. PTA, pt indep and lives alone; reports family nearby and intermittently assist with household tasks. Educ on precautions, positioning, and therex. Today, pt ambulated 42' x2 with RW and min guard for balance, requiring seated rest break between trials. Pt with great ability to maintain LLE NWB precautions, but distance limited by decreased activity tolerance. Pt not interested in SNF-level therapies; reports daughter may be able to provide initial 24/7 support upon return home. Will definitely need a RW; also recommend manual w/c for longer ambulation distances. Will follow acutely to maximize functional mobility and independence prior to return home.    Follow Up Recommendations Home health PT;Supervision/Assistance - 24 hour    Equipment Recommendations  Rolling walker with 5" wheels;3in1 (PT);Wheelchair (measurements PT)    Recommendations for Other Services OT consult     Precautions / Restrictions Precautions Precautions: Fall;Other (comment) Precaution Comments: Legally blind (diabetic retinopathy) Restrictions Weight Bearing Restrictions: Yes LLE Weight Bearing: Non weight bearing      Mobility  Bed Mobility               General bed mobility comments: Received sitting in recliner  Transfers Overall transfer level: Needs assistance Equipment used: Rolling walker (2 wheeled) Transfers: Sit to/from Stand Sit to Stand: Min guard         General transfer comment: Min guard for balance. Cues for NWB precautions and correct hand placement; repeated cues to slow down  movement  Ambulation/Gait Ambulation/Gait assistance: Min guard Gait Distance (Feet): 35 Feet(+35) Assistive device: Rolling walker (2 wheeled) Gait Pattern/deviations: Step-to pattern Gait velocity: Decreased   General Gait Details: Amb in room with RW and min guard for balance. Pt with good ability to hop on RLE in order to maintain LLE NWB precautions. Cues to slow down speed, especially with turns  Financial trader [employer] (Stroke Patients Only)       Balance Overall balance assessment: Needs assistance Sitting-balance support: No upper extremity supported;Feet supported;Feet unsupported Sitting balance-Leahy Scale: Good       Standing balance-Leahy Scale: Poor Standing balance comment: Reliant on UE support                             Pertinent Vitals/Pain Pain Assessment: Faces Faces Pain Scale: Hurts a little bit Pain Location: L ankle Pain Intervention(s): Monitored during session;Ice applied;Repositioned    Home Living Family/patient expects to be discharged to:: Private residence Living Arrangements: Alone Available Help at Discharge: Family;Available PRN/intermittently Type of Home: House Home Access: Ramped entrance     Home Layout: One level   Additional Comments: Multiple family members nearby. Pt reports daughter may be able to stay with pt a few days at d/c    Prior Function Level of Independence: Independent         Comments: Does not drive due to visual deficits; family drives. Family assists intermittently with household tasks (cleaning)     Hand Dominance  Extremity/Trunk Assessment   Upper Extremity Assessment Upper Extremity Assessment: Overall WFL for tasks assessed    Lower Extremity Assessment Lower Extremity Assessment: LLE deficits/detail LLE Deficits / Details: s/p L ankle ORIF; hip and knee at least 4/5  LLE: Unable to fully assess due to immobilization        Communication      Cognition Arousal/Alertness: Awake/alert Behavior During Therapy: WFL for tasks assessed/performed Overall Cognitive Status: No family/caregiver present to determine baseline cognitive functioning Area of Impairment: Safety/judgement;Problem solving;Attention                   Current Attention Level: Selective     Safety/Judgement: Decreased awareness of safety;Decreased awareness of deficits   Problem Solving: Requires verbal cues General Comments: Likely baseline cognition      General Comments      Exercises General Exercises - Lower Extremity Long Arc Quad: AROM;Both;Seated;15 reps Hip Flexion/Marching: AROM;Left;10 reps;Seated   Assessment/Plan    PT Assessment Patient needs continued PT services  PT Problem List Decreased strength;Decreased activity tolerance;Decreased balance;Decreased mobility;Decreased range of motion;Decreased knowledge of use of DME;Decreased knowledge of precautions       PT Treatment Interventions DME instruction;Gait training;Stair training;Functional mobility training;Therapeutic activities;Therapeutic exercise;Balance training;Patient/family education;Wheelchair mobility training    PT Goals (Current goals can be found in the Care Plan section)  Acute Rehab PT Goals Patient Stated Goal: "Be able to sit on my porch and get out of the house" PT Goal Formulation: With patient Time For Goal Achievement: 10/20/17 Potential to Achieve Goals: Good    Frequency Min 5X/week   Barriers to discharge Decreased caregiver support      Co-evaluation               AM-PAC PT "6 Clicks" Daily Activity  Outcome Measure Difficulty turning over in bed (including adjusting bedclothes, sheets and blankets)?: None Difficulty moving from lying on back to sitting on the side of the bed? : None Difficulty sitting down on and standing up from a chair with arms (e.g., wheelchair, bedside commode, etc,.)?: A Little Help  needed moving to and from a bed to chair (including a wheelchair)?: A Little Help needed walking in hospital room?: A Little Help needed climbing 3-5 steps with a railing? : A Lot 6 Click Score: 19    End of Session Equipment Utilized During Treatment: Gait belt Activity Tolerance: Patient tolerated treatment well Patient left: in chair;with call bell/phone within reach(with OT present) Nurse Communication: Mobility status PT Visit Diagnosis: Other abnormalities of gait and mobility (R26.89)    Time: 0177-9390 PT Time Calculation (min) (ACUTE ONLY): 34 min   Charges:   PT Evaluation $PT Eval Moderate Complexity: 1 Mod PT Treatments $Gait Training: 8-22 mins   PT G Codes:       Mabeline Caras, PT, DPT Acute Rehab Services  Pager: [place] 10/06/2017, 4:16 PM

## 2017-10-06 NOTE — Plan of Care (Signed)
  Problem: Education: Goal: Knowledge of General Education information will improve Outcome: Progressing   Problem: Coping: Goal: Level of anxiety will decrease Outcome: Progressing   Problem: Pain Managment: Goal: General experience of comfort will improve Outcome: Progressing   

## 2017-10-06 NOTE — Progress Notes (Addendum)
Subjective:  Jason Woods stated that he was feeling well today. He said that his pain was well controlled to this point. He does not endorse increased difficulties breathing, although he is wearing [place] and does not at home. He still endorses a cough that is occasionally productive of clear or yellow mucus. The patient denies any dizziness or feeling faint. He is aware of what he feels like when he is hypoglycemic and states that if he notices this feeling at night when he is at home, he will drink some orange juice or eat cake.   Objective:  Vital signs in last 24 hours: Vitals:   10/05/17 2000 10/05/17 2021 10/06/17 0040 10/06/17 0517  BP:  (!) 116/59 (!) 106/53 118/63  Pulse: 76 75 74 76  Resp: 18 18 16 16   Temp:  98.2 F (36.8 C) 97.8 F (36.6 C) 97.7 F (36.5 C)  TempSrc:  Oral Oral Oral  SpO2: 93% (!) 79% 96% 100%  Weight:      Height:       Weight change:   Intake/Output Summary (Last 24 hours) at 10/06/2017 1144 Last data filed at 10/06/2017 0830 Gross per 24 hour  Intake 3789.45 ml  Output 810 ml  Net 2979.45 ml   Physical Exam:  General: No acute distress, pt resting comfortably  Pulm: No increased work of breathing, course crackles RLL, more fine crackles LL lung field, upper lungs CAB  CV: RRR, nMRG  Abd: BS+, NTND  Ext: soft cast and ice back on lower left extremity   Assessment/Plan:  Active Problems:   Open left ankle fracture   Open trimalleolar fracture of ankle, left, type I or II, initial encounter  Ankle Fracture Post-op Day One:  The patient did not endorse pain or request improved pain management. The surgical site was wrapped thoroughly to with ice resting on the bandage.  -management of surgical site per orthopaedics -pain management per orthopaedics   Diabetes:  Given the patient's A1C of 5.7, it is likely that he is hypoglycemic a substantial portion of the time. He stated that he would manage this feeling by trying to eat more vs decreasing  his insulin. It is likely that the fall that caused his injury is attributable to hypoglycemia in the setting of decreased PO intake secondary to a viral gastroenteritis and chronic intermittent hypoglycemia at baseline. In order to prevent such episodes outpatient adjustment of the patient's insulin regimen could be prudent.  -insulin Aspart 0-9 TID w meals  -Insulin NPH 5 units BID   Productive Cough  Per ortho's note there was significant "junky" output of the ET tube after surgery. The patient does not endorse increased SOB but does mention an ongoing productive cough. The patient's lung exam was significant for crackles in the right lower lung, but with no fever or white count and a chest x-ray that is read as not suggesting a focal consolidation, pneumonia is less likely.  -if patient becomes febrile or has increased work of breathing consider abx.  -incentive spirometry to prevent post-op atelectasis and flutter valve to help clear mucous  Hypertension:   The patient's blood pressures have remained <355 systolic. -hold home hydrochlorothiazide, metoprolol, losartan, amlodipine    Tabacco use: - Nicotine patch  FEN: replete lytes prn, carb modified diet  VTE ppx: Lovenox Code Status: FULL    LOS: 1 day   Rhetta Mura, Medical Student 10/06/2017, 11:44 AM  Attestation for Student Documentation:  I personally was present and performed  or re-performed the history, physical exam and medical decision-making activities of this service and have verified that the service and findings are accurately documented in the student's note.  Alphonzo Grieve, MD 10/06/2017, 5:45 PM

## 2017-10-06 NOTE — Progress Notes (Addendum)
Inpatient Diabetes Program Recommendations  AACE/ADA: New Consensus Statement on Inpatient Glycemic Control (2015)  Target Ranges:  Prepandial:   less than 140 mg/dL      Peak postprandial:   less than 180 mg/dL (1-2 hours)      Critically ill patients:  140 - 180 mg/dL   Lab Results  Component Value Date   GLUCAP 295 (H) 10/06/2017    Review of Glycemic Control Results for ARIA, PICKRELL (MRN [id number]) as of 10/06/2017 10:58  Ref. Range 10/05/2017 18:25 10/05/2017 19:32 10/05/2017 21:34 10/06/2017 07:14  Glucose-Capillary Latest Ref Range: 70 - 99 mg/dL 82 85 121 (H) 295 (H)   Diabetes history: Type 2 DM Outpatient Diabetes medications: NPH 35 units QAM, 10 units QPM, Metformin 850 mg BID Current orders for Inpatient glycemic control: NPH 5 units BID, Novolog 4 units TID, Novolog 0-5 units QHS, Novolog 0-15 units TID  Inpatient Diabetes Program Recommendations:    No noted A1C, consider ordering A1C?   Addendum: Per Md note, A1C 5.7% and indicated frequent lows. On admit was 56 mg/dL. However, not seeing A1C result, assuming this was from PCP office? Regardless, agree with current orders and would recommend outpatient insulin adjustments.   Patient received Decadron 8 mg x 1, thus anticipate BS to increase. Given new insulin orders will follow.   Francesco Runner, MSN, RNC-OB Diabetes Coordinator [phone] (8a-5p)

## 2017-10-06 NOTE — Plan of Care (Signed)
  Problem: Education: Goal: Knowledge of General Education information will improve Outcome: Progressing Note:  POC and orders reviewed with pt. and family.

## 2017-10-07 DIAGNOSIS — W19XXXA Unspecified fall, initial encounter: Secondary | ICD-10-CM

## 2017-10-07 DIAGNOSIS — T1490XA Injury, unspecified, initial encounter: Secondary | ICD-10-CM

## 2017-10-07 DIAGNOSIS — Q899 Congenital malformation, unspecified: Secondary | ICD-10-CM

## 2017-10-07 DIAGNOSIS — R05 Cough: Secondary | ICD-10-CM

## 2017-10-07 LAB — GLUCOSE, CAPILLARY
Glucose-Capillary: 221 mg/dL — ABNORMAL HIGH (ref 70–99)
Glucose-Capillary: 178 mg/dL — ABNORMAL HIGH (ref 70–99)
Glucose-Capillary: 192 mg/dL — ABNORMAL HIGH (ref 70–99)
Glucose-Capillary: 158 mg/dL — ABNORMAL HIGH (ref 70–99)

## 2017-10-07 LAB — CBC
HCT: 29.5 % — ABNORMAL LOW (ref 39.0–52.0)
HEMOGLOBIN: 9.8 g/dL — AB (ref 13.0–17.0)
MCH: 28.4 pg (ref 26.0–34.0)
MCHC: 33.2 g/dL (ref 30.0–36.0)
MCV: 85.5 fL (ref 78.0–100.0)
Platelets: 212 10*3/uL (ref 150–400)
RBC: 3.45 MIL/uL — AB (ref 4.22–5.81)
RDW: 13.2 % (ref 11.5–15.5)
WBC: 9.1 10*3/uL (ref 4.0–10.5)

## 2017-10-07 MED ORDER — BENZONATATE 100 MG PO CAPS
100.0000 mg | ORAL_CAPSULE | Freq: Three times a day (TID) | ORAL | Status: DC | PRN
  Administered 2017-10-07 – 2017-10-08 (×2): 100 mg via ORAL
  Filled 2017-10-07 (×2): qty 1

## 2017-10-07 MED ORDER — INSULIN NPH (HUMAN) (ISOPHANE) 100 UNIT/ML ~~LOC~~ SUSP
10.0000 [IU] | Freq: Two times a day (BID) | SUBCUTANEOUS | Status: DC
  Administered 2017-10-07: 5 [IU] via SUBCUTANEOUS

## 2017-10-07 MED ORDER — INSULIN NPH (HUMAN) (ISOPHANE) 100 UNIT/ML ~~LOC~~ SUSP
5.0000 [IU] | Freq: Once | SUBCUTANEOUS | Status: AC
  Administered 2017-10-07: 5 [IU] via SUBCUTANEOUS

## 2017-10-07 MED ORDER — INSULIN NPH (HUMAN) (ISOPHANE) 100 UNIT/ML ~~LOC~~ SUSP
6.0000 [IU] | Freq: Two times a day (BID) | SUBCUTANEOUS | Status: DC
  Filled 2017-10-07: qty 10

## 2017-10-07 NOTE — Care Management Note (Signed)
Case Management Note  Patient Details  Name: Jason Woods MRN: [id number] Date of Birth: [birth year]  Subjective/Objective:    72 yr old male admitted after fall at home. Patient had hypoglycemic event. Patient underwent I & D, and ORIF of left ankle.   Action/Plan: Referral for Home Health called to [name], [place] Liaison.    Expected Discharge Date:    10/08/17              Expected Discharge Plan:  [place]  In-House Referral:  NA  Discharge planning Services  CM Consult  Post Acute Care Choice:  Durable Medical Equipment, Home Health Choice offered to:  Patient  DME Arranged:  Wheelchair manual, 3-N-1, Walker rolling DME Agency:  [place] Arranged:  PT, OT [facility] Agency:  [place]  Status of Service:  Completed, signed off  If discussed at [place] of Stay Meetings, dates discussed:    Additional Comments:  [name], RN 10/07/2017, 2:47 PM

## 2017-10-07 NOTE — Progress Notes (Signed)
Occupational Therapy Treatment Patient Details Name: Jason Woods MRN: [id number] DOB: [birth year] Today's Date: 10/07/2017    History of present illness Pt is a 72 y.o. male who sustained fall at home with L trimalleolar ankle fx; now s/p L ankle ORIF on 10/05/17. PMH includes HTN, DM, legally blind.   OT comments  Patient progressing well.  He demonstrates good ability to maintain NWB for functional transfers, short distance functional mobility and ADLs using RW, but requires continuous cueing for safety, hand placement, walker management and pacing.  Completed toilet transfer with min guard assistance, toileting with min assist, and bed mobility with supervision.  Continued educated with recommendations to complete sponge bathing at dc, patient agreeable.  Patients safest disposition would be 24/7 care at SNF, but if discharging home will need HHOT and 24/7 (possible aide if family is unable to support).  Will continue to follow while admitted.    Follow Up Recommendations  Home health OT;Supervision/Assistance - 24 hour(safest disposition: SNF, reports does not have 24/7)    Equipment Recommendations  3 in 1 bedside commode    Recommendations for Other Services      Precautions / Restrictions Precautions Precautions: Fall;Other (comment) Precaution Comments: Legally blind (diabetic retinopathy) Required Braces or Orthoses: Other Brace/Splint Other Brace/Splint: post op splint to L LE Restrictions Weight Bearing Restrictions: Yes LLE Weight Bearing: Non weight bearing       Mobility Bed Mobility Overal bed mobility: Needs Assistance Bed Mobility: Sit to Supine       Sit to supine: Supervision   General bed mobility comments: cueing for safety  Transfers Overall transfer level: Needs assistance Equipment used: Rolling walker (2 wheeled) Transfers: Sit to/from Stand Sit to Stand: Min guard         General transfer comment: increaed time, cueing for hand  placement, technique and safety; cueing for pacing     Balance Overall balance assessment: Needs assistance Sitting-balance support: No upper extremity supported;Feet supported;Feet unsupported Sitting balance-Leahy Scale: Good     Standing balance support: Bilateral upper extremity supported Standing balance-Leahy Scale: Poor Standing balance comment: Reliant on UE support                           ADL either performed or assessed with clinical judgement   ADL Overall ADL's : Needs assistance/impaired     Grooming: Set up;Sitting                   Toilet Transfer: Minimal assistance;Ambulation;BSC;RW;Comfort height toilet Toilet Transfer Details (indicate cue type and reason): 3:1 over toilet, cueing for safety, pacing and hand placement Toileting- Clothing Manipulation and Hygiene: Sitting/lateral lean;Minimal assistance;Sit to/from stand Toileting - Clothing Manipulation Details (indicate cue type and reason): seated hygiene supervision, minA clothing mgmt standing    Tub/Shower Transfer Details (indicate cue type and reason): pt voiced plans to complete sponge baths at dc  Functional mobility during ADLs: Minimal assistance;Rolling walker General ADL Comments: short distance func mobility, good adherance to NWB precautions but requires cueing for speed, walker management and safety     Vision       Perception     Praxis      Cognition Arousal/Alertness: Awake/alert Behavior During Therapy: WFL for tasks assessed/performed Overall Cognitive Status: No family/caregiver present to determine baseline cognitive functioning Area of Impairment: Safety/judgement;Following commands  Safety/Judgement: Decreased awareness of deficits;Decreased awareness of safety   Problem Solving: Requires verbal cues;Difficulty sequencing          Exercises     Shoulder Instructions       General Comments      Pertinent  Vitals/ Pain       Pain Assessment: No/denies pain  Home Living                                          Prior Functioning/Environment              Frequency  Min 2X/week        Progress Toward Goals  OT Goals(current goals can now be found in the care plan section)  Progress towards OT goals: Progressing toward goals  Acute Rehab OT Goals Patient Stated Goal: "Be able to sit on my porch and get out of the house" OT Goal Formulation: With patient Time For Goal Achievement: 10/20/17 Potential to Achieve Goals: Good  Plan Discharge plan remains appropriate;Frequency remains appropriate    Co-evaluation                 AM-PAC PT "6 Clicks" Daily Activity     Outcome Measure   Help from another person eating meals?: None Help from another person taking care of personal grooming?: A Little Help from another person toileting, which includes using toliet, bedpan, or urinal?: A Lot Help from another person bathing (including washing, rinsing, drying)?: A Lot Help from another person to put on and taking off regular upper body clothing?: None Help from another person to put on and taking off regular lower body clothing?: A Lot 6 Click Score: 17    End of Session Equipment Utilized During Treatment: Gait belt;Rolling walker  OT Visit Diagnosis: Unsteadiness on feet (R26.81);Other abnormalities of gait and mobility (R26.89)   Activity Tolerance Patient tolerated treatment well   Patient Left in bed;with chair alarm set;with call bell/phone within reach   Nurse Communication Mobility status        Time: 1355-1419 OT Time Calculation (min): 24 min  Charges: OT General Charges $OT Visit: 1 Visit OT Treatments $Self Care/Home Management : 23-37 mins  Delight Stare, OTR/L  Pager [place] 10/07/2017, 2:26 PM

## 2017-10-07 NOTE — Progress Notes (Addendum)
Physical Therapy Treatment Patient Details Name: Jason Woods MRN: [id number] DOB: [birth year] Today's Date: 10/07/2017    History of Present Illness Pt is a 72 y.o. male who sustained fall at home with L trimalleolar ankle fx; now s/p L ankle ORIF on 10/05/17. PMH includes HTN, DM, legally blind.    PT Comments    Pt making steady progress with functional mobility, remains limited secondary to fatigue. Pt would continue to benefit from skilled physical therapy services at this time while admitted and after d/c to address the below listed limitations in order to improve overall safety and independence with functional mobility.  Patient is s/p L ankle ORIF which impairs their ability to perform daily activities in the home. A walker alone will not resolve the issues with performing activities of daily living. A wheelchair will allow patient to safely perform daily activities. The patient can self propel in the home or has a caregiver who can provide assistance.    Follow Up Recommendations  Home health PT;Supervision/Assistance - 24 hour - family cannot provide 24/7, will need [place] aide     Equipment Recommendations  Rolling walker with 5" wheels;3in1 (PT);Wheelchair (measurements PT);Other (comment)(w/c with elevating leg rests)    Recommendations for Other Services       Precautions / Restrictions Precautions Precautions: Fall;Other (comment) Precaution Comments: Legally blind (diabetic retinopathy) Restrictions Weight Bearing Restrictions: Yes LLE Weight Bearing: Non weight bearing    Mobility  Bed Mobility               General bed mobility comments: pt OOB in recliner chair upon arrival  Transfers Overall transfer level: Needs assistance Equipment used: Rolling walker (2 wheeled) Transfers: Sit to/from Stand Sit to Stand: Min guard         General transfer comment: increased time and effort, cueing for technique and safe hand placement, min guard for  safety  Ambulation/Gait Ambulation/Gait assistance: Min guard Gait Distance (Feet): 35 Feet Assistive device: Rolling walker (2 wheeled) Gait Pattern/deviations: (hop-to pattern on R LE) Gait velocity: Decreased Gait velocity interpretation: <1.31 ft/sec, indicative of household ambulator General Gait Details: pt able to maintain NWB L LE throughout independently; pt limited secondary to UE fatigue/weakness; pt with poor safety awareness when ambulating in room and prematurely sitting in chair prior to being within a safe distance   Stairs             Wheelchair Mobility    Modified Rankin (Stroke Patients Only)       Balance Overall balance assessment: Needs assistance Sitting-balance support: No upper extremity supported;Feet supported;Feet unsupported Sitting balance-Leahy Scale: Good     Standing balance support: Bilateral upper extremity supported Standing balance-Leahy Scale: Poor Standing balance comment: Reliant on UE support                            Cognition Arousal/Alertness: Awake/alert Behavior During Therapy: WFL for tasks assessed/performed Overall Cognitive Status: No family/caregiver present to determine baseline cognitive functioning Area of Impairment: Safety/judgement;Following commands                         Safety/Judgement: Decreased awareness of deficits;Decreased awareness of safety   Problem Solving: Requires verbal cues;Difficulty sequencing        Exercises General Exercises - Lower Extremity Quad Sets: AROM;Strengthening;Left;10 reps;Seated Long Arc Quad: AROM;Strengthening;Left;10 reps;Seated Straight Leg Raises: AROM;Strengthening;Left;10 reps;Seated Hip Flexion/Marching: AROM;Strengthening;Left;10 reps;Seated    General Comments  Pertinent Vitals/Pain Pain Assessment: No/denies pain    Home Living                      Prior Function            PT Goals (current goals can now  be found in the care plan section) Acute Rehab PT Goals PT Goal Formulation: With patient Time For Goal Achievement: 10/20/17 Potential to Achieve Goals: Good Progress towards PT goals: Progressing toward goals    Frequency    Min 5X/week      PT Plan Current plan remains appropriate    Co-evaluation              AM-PAC PT "6 Clicks" Daily Activity  Outcome Measure  Difficulty turning over in bed (including adjusting bedclothes, sheets and blankets)?: None Difficulty moving from lying on back to sitting on the side of the bed? : None Difficulty sitting down on and standing up from a chair with arms (e.g., wheelchair, bedside commode, etc,.)?: Unable Help needed moving to and from a bed to chair (including a wheelchair)?: A Little Help needed walking in hospital room?: A Little Help needed climbing 3-5 steps with a railing? : A Lot 6 Click Score: 17    End of Session Equipment Utilized During Treatment: Gait belt Activity Tolerance: Patient limited by fatigue Patient left: in chair;with call bell/phone within reach Nurse Communication: Mobility status PT Visit Diagnosis: Other abnormalities of gait and mobility (R26.89)     Time: 5830-9407 PT Time Calculation (min) (ACUTE ONLY): 13 min  Charges:  $Therapeutic Activity: 8-22 mins                    G Codes:       Sherie Don, PT, DPT    [employer] 10/07/2017, 10:12 AM

## 2017-10-07 NOTE — Progress Notes (Signed)
   Subjective: 2 Days Post-Op Procedure(s) (LRB): IRRIGATION AND DEBRIDEMENT QPEN ANKLE FRACTURE (Left) OPEN REDUCTION INTERNAL FIXATION TRIMALLEOLAR (ORIF)ANKLE FRACTURE (Left) Patient reports pain as mild and moderate.  " I'm not moving good, my chest is sore"   " I will need an aid 5X per week for 5 hrs. "  Objective: Vital signs in last 24 hours: Temp:  [97.9 F (36.6 C)-98.2 F (36.8 C)] 98.1 F (36.7 C) (07/18 0513) Pulse Rate:  [70-82] 81 (07/18 0513) Resp:  [16-17] 16 (07/18 0513) BP: (125-132)/(59-71) 132/71 (07/18 0513) SpO2:  [97 %-100 %] 100 % (07/18 0513)  Intake/Output from previous day: 07/17 0701 - 07/18 0700 In: 780 [P.O.:480; I.V.:200; IV Piggyback:100] Out: 625 [Urine:625] Intake/Output this shift: No intake/output data recorded.  Recent Labs    10/05/17 1224 10/06/17 0446 10/07/17 0405  HGB 10.5* 9.8* 9.8*   Recent Labs    10/06/17 0446 10/07/17 0405  WBC 9.6 9.1  RBC 3.46* 3.45*  HCT 30.3* 29.5*  PLT 178 212   Recent Labs    10/06/17 0446 10/06/17 1831  NA 129* 128*  K 3.8 3.5  CL 94* 89*  CO2 27 29  BUN 18 14  CREATININE 1.00 1.00  GLUCOSE 255* 295*  CALCIUM 7.7* 8.4*   No results for input(s): LABPT, INR in the last 72 hours.  Neurologically intact splint intact.  Dg Chest 2 View  Result Date: 10/06/2017 CLINICAL DATA:  COPD EXAM: CHEST - 2 VIEW COMPARISON:  None. FINDINGS: Normal cardiac silhouette. Central venous pulmonary congestion. No infiltrate or pneumothorax. No acute osseous abnormality. IMPRESSION: Central venous pulmonary congestion. Electronically Signed   By: Suzy Bouchard M.D.   On: 10/06/2017 11:08    Assessment/Plan: 2 Days Post-Op Procedure(s) (LRB): IRRIGATION AND DEBRIDEMENT QPEN ANKLE FRACTURE (Left) OPEN REDUCTION INTERNAL FIXATION TRIMALLEOLAR (ORIF)ANKLE FRACTURE (Left) Up with therapy, continue to work on mobility. Pt and daughter do not want option of  SNF.   Jason Woods 10/07/2017, 7:43  AM

## 2017-10-07 NOTE — Plan of Care (Signed)
  Problem: Education: Goal: Knowledge of General Education information will improve Outcome: Progressing   Problem: Health Behavior/Discharge Planning: Goal: Ability to manage health-related needs will improve Outcome: Progressing   Problem: Clinical Measurements: Goal: Ability to maintain clinical measurements within normal limits will improve Outcome: Progressing Goal: Will remain free from infection Outcome: Progressing Goal: Diagnostic test results will improve Outcome: Progressing Goal: Respiratory complications will improve Outcome: Progressing Goal: Cardiovascular complication will be avoided Outcome: Progressing   Problem: Activity: Goal: Risk for activity intolerance will decrease Outcome: Progressing   

## 2017-10-07 NOTE — Progress Notes (Signed)
Pt. coughing a lot; dry and non-productive; Mucinex given earlier; Dr. Sherry Ruffing paged; RTC [phone].

## 2017-10-07 NOTE — Progress Notes (Signed)
  Date: 10/07/2017  Patient name: Jason Woods  Medical record number: [id number]  Date of birth: 12/24/1945   I have seen and evaluated this patient and I have discussed the plan of care with the house staff. Please see their note for complete details. I concur with their findings with the following additions/corrections: Diona Fanti was seen on AM rounds with team. He complains of not feeling well, not yet ready to go home mostly due to functional limitations, and cough. Walked 35 ft with PT and walker today but states arms got sore afterwards. SNF would be best dispo option but pt hesitant. Reviewed Dr Lorin Mercy' note. I do not know if home health can provide 5 hrs that pt requested today - we will ask social work to talk with pt about available services. Lungs are improved today - good air flow, much fewer rales. No fever, no hypoxia, CXR x2 no infiltrate. Will cont to treat cough / possible undiagnosed COPD without abx.  Bartholomew Crews, MD 10/07/2017, 11:40 AM

## 2017-10-07 NOTE — Progress Notes (Addendum)
Subjective:  Jason Woods states that he is not doing well and does not want to go home today. He endorses continued pain in his chest that is worse with breathing and asocialed with a dry cough and soar throats. He feels that he is weak and will need some help at home to make sure he is ok and ease some of the burden off of his daughter. The patient stated that he took care of his wife for years in a SNF and never wants to go back to one because "they wear you down."    Objective:  Vital signs in last 24 hours: Vitals:   10/06/17 0517 10/06/17 1500 10/06/17 2019 10/07/17 0513  BP: 118/63 125/65 (!) 128/59 132/71  Pulse: 76 70 82 81  Resp: 16 17 16 16   Temp: 97.7 F (36.5 C) 97.9 F (36.6 C) 98.2 F (36.8 C) 98.1 F (36.7 C)  TempSrc: Oral Oral Oral Oral  SpO2: 100% 97% 97% 100%  Weight:      Height:       Weight change:   Intake/Output Summary (Last 24 hours) at 10/07/2017 1144 Last data filed at 10/07/2017 0700 Gross per 24 hour  Intake 540 ml  Output 625 ml  Net -85 ml   Physical Exam:  General: No acute distress, pt up in chair today Pulm: No increased work of breathing, crackles in the mid lower lung field on lower right with reduced breath sounds in lowest right sided lung field, more fine crackles LL lung field, upper lungs CAB  CV: frequent ectopic beats making a distinguished rhythm had to discern, nMRG  Abd: BS+, NTND  Ext: soft cast and ice back on lower left extremity   Assessment/Plan:  Active Problems:   Open left ankle fracture   Open trimalleolar fracture of ankle, left, type I or II, initial encounter  Ankle Fracture Post-op Day One:  The patient did not endorse pain or request improved pain management. The surgical site was wrapped thoroughly to with ice resting on the bandage.  -management of surgical site per orthopaedics -pain management per orthopaedics   Diabetes:  Given the patient's A1C of 5.7, it is likely that he is hypoglycemic a  substantial portion of the time. Over the last 24 hours the patient has had blood glucose readings mostly >250  -insulin Aspart 0-9 TID w meals  -Increase Insulin NPH to 10 units BID   Productive Cough  The patient mentions an ongoing productive cough as well as pleuritic pain and pain in his throat. The patient's lung exam was significant for crackles in the right lower lung, but with no fever or white count and a chest x-ray that is read as not suggesting a focal consolidation, pneumonia is less likely.  -if patient becomes febrile or has increased work of breathing consider abx.  -incentive spirometry to prevent post-op atelectasis and flutter valve to help clear mucous -on hospital follow up with PCP will recommend that if cough is ongoing, to pursue work up for chronic cough, (ie PFTs, CT; also has esophageal stricture so GERD could be component)  Hypertension:  The patient's blood pressures have remained 258N to 277O systolic continue to hold blood pressure medications- consider relaxing BP goal in patient with BPs under 140 with a history of falls  -hold home hydrochlorothiazide, metoprolol, losartan, amlodipine for now, may restart if BPs start trending up   Tabacco use: - Nicotine patch  FEN: replete lytes prn, carb modified diet  VTE  ppx: Lovenox Code Status: FULL    LOS: 2 days   Jason Woods, Medical Student 10/07/2017, 11:44 AM  Attestation for Student Documentation:  I personally was present and performed or re-performed the history, physical exam and medical decision-making activities of this service and have verified that the service and findings are accurately documented in the student's note.  Alphonzo Grieve, MD 10/07/2017, 3:32 PM

## 2017-10-07 NOTE — Plan of Care (Signed)
  Problem: Education: Goal: Knowledge of General Education information will improve Outcome: Progressing Note:  POC reviewed with pt.; coughing- using incentive spirometry.

## 2017-10-08 LAB — BASIC METABOLIC PANEL
ANION GAP: 9 (ref 5–15)
BUN: 7 mg/dL — AB (ref 8–23)
CHLORIDE: 97 mmol/L — AB (ref 98–111)
CO2: 29 mmol/L (ref 22–32)
Calcium: 8.3 mg/dL — ABNORMAL LOW (ref 8.9–10.3)
Creatinine, Ser: 0.77 mg/dL (ref 0.61–1.24)
GFR calc Af Amer: >60 mL/min (ref >60)
GLUCOSE: 183 mg/dL — AB (ref 70–99)
POTASSIUM: 3.3 mmol/L — AB (ref 3.5–5.1)
Sodium: 135 mmol/L (ref 135–145)

## 2017-10-08 LAB — GLUCOSE, CAPILLARY
GLUCOSE-CAPILLARY: 308 mg/dL — AB (ref 70–99)
Glucose-Capillary: 195 mg/dL — ABNORMAL HIGH (ref 70–99)

## 2017-10-08 MED ORDER — LOSARTAN POTASSIUM 25 MG PO TABS: 25.0000 mg | ORAL_TABLET | Freq: Every day | ORAL | 1 refills | Status: DC

## 2017-10-08 MED ORDER — INSULIN GLARGINE 100 UNIT/ML ~~LOC~~ SOLN: 10.0000 [IU] | Freq: Every day | SUBCUTANEOUS | 1 refills | Status: DC

## 2017-10-08 MED ORDER — INSULIN GLARGINE 100 UNIT/ML ~~LOC~~ SOLN: 10.0000 [IU] | Freq: Every day | SUBCUTANEOUS | Status: DC

## 2017-10-08 MED ORDER — OXYCODONE-ACETAMINOPHEN 7.5-325 MG PO TABS: 1.0000 | ORAL_TABLET | ORAL | 0 refills | Status: AC | PRN

## 2017-10-08 MED ORDER — OXYCODONE-ACETAMINOPHEN 7.5-325 MG PO TABS: 1.0000 | ORAL_TABLET | ORAL | 0 refills | Status: DC | PRN

## 2017-10-08 MED ORDER — ENOXAPARIN SODIUM 40 MG/0.4ML ~~LOC~~ SOLN
40.0000 mg | SUBCUTANEOUS | Status: DC
  Administered 2017-10-08: 40 mg via SUBCUTANEOUS
  Filled 2017-10-08: qty 0.4

## 2017-10-08 MED ORDER — LOSARTAN POTASSIUM 25 MG PO TABS
25.0000 mg | ORAL_TABLET | Freq: Every day | ORAL | Status: DC
  Administered 2017-10-08: 25 mg via ORAL
  Filled 2017-10-08: qty 1

## 2017-10-08 MED ORDER — POTASSIUM CHLORIDE CRYS ER 20 MEQ PO TBCR
40.0000 meq | EXTENDED_RELEASE_TABLET | Freq: Once | ORAL | Status: AC
  Administered 2017-10-08: 40 meq via ORAL
  Filled 2017-10-08: qty 2

## 2017-10-08 NOTE — Progress Notes (Signed)
Discharge instructions discussed in great detail with pt and daughter. Times, dates, and route of medication administration reinforced with a graph and highlighted for understanding. Pt and daughter verbalized understanding of medication administration, follow up appointments, and when to call doctor or come back to the hospital.

## 2017-10-08 NOTE — Discharge Summary (Addendum)
**Note DeJasonIdentified via Obfuscation** Name: Jason Woods MRN: [id number] DOB: 1945/06/22 72 y.o. PCP: Glenda Chroman, MD  Date of Admission: 10/05/2017 12:01 PM Date of Discharge:  Attending Physician: [name], MD  Discharge Diagnosis: 1. Fracture Dislocation of Left Ankle with ORIF   Discharge Medications: Allergies as of 10/08/2017      Reactions   Celexa [citalopram Hydrobromide]    Nausea,blurred vision   Crestor [rosuvastatin]    Pain, aching muscles   Lipitor [atorvastatin]    Aching, MS   Mobic [meloxicam]    nausea   Prozac [fluoxetine Hcl]    fatigue   Zocor [simvastatinJasonhigh Dose]    MS aches      Medication List    STOP taking these medications   amLODipine 5 MG tablet Commonly known as:  NORVASC   hydrochlorothiazide 25 MG tablet Commonly known as:  HYDRODIURIL   metoprolol succinate 50 MG 24 hr tablet Commonly known as:  TOPROLJasonXL     TAKE these medications   acetaminophen 500 MG tablet Commonly known as:  TYLENOL Take 1,000 mg by mouth every 8 (eight) hours as needed for mild pain or headache.   ALPRAZolam 1 MG tablet Commonly known as:  XANAX Take 1 mg by mouth 2 (two) times daily.   b complex vitamins tablet Take 1 tablet by mouth daily.   bimatoprost 0.01 % Soln Commonly known as:  LUMIGAN Place 1 drop into the right eye at bedtime.   buprenorphine 8 MG Subl SL tablet Commonly known as:  SUBUTEX Place 4 mg under the tongue every other day.   cyanocobalamin 2000 MCG tablet Take 2,000 mcg by mouth daily.   D 2000 2000 units Tabs Generic drug:  Cholecalciferol Take 2,000 Units by mouth daily.   dorzolamide 2 % ophthalmic solution Commonly known as:  TRUSOPT Place 1 drop into both eyes 2 (two) times daily.   fluticasone 50 MCG/ACT nasal spray Commonly known as:  FLONASE Place 2 sprays into both nostrils daily.   gabapentin 300 MG capsule Commonly known as:  NEURONTIN Take 300 mg by mouth 2 (two) times daily.   hydrocortisone cream 1 % Apply 1  application topically daily as needed for itching (dry skin).   hydrOXYzine 25 MG tablet Commonly known as:  ATARAX/VISTARIL TAKE 1 TABLET BY MOUTH AT BEDTIME AS NEEDED FOR ITCHING   losartan 25 MG tablet Commonly known as:  COZAAR Take 1 tablet (25 mg total) by mouth daily. What changed:    medication strength  how much to take   metFORMIN 850 MG tablet Commonly known as:  GLUCOPHAGE Take 850 mg by mouth 2 (two) times daily with a meal.   multivitamin with minerals Tabs tablet Take 1 tablet by mouth daily.   NOVOLIN [name] Inject 10[name] Units into the skin 2 (two) times daily. 35 units in am and 10 units in pm   oxyCODONEJasonacetaminophen 7.5Jason325 MG tablet Commonly known as:  PERCOCET Take 1 tablet by mouth every 4 (four) hours as needed for up to 7 days for severe pain. What changed:    when to take this  reasons to take this   pantoprazole 40 MG tablet Commonly known as:  PROTONIX Take 40 mg by mouth daily.   polyethylene glycol powder powder Commonly known as:  GLYCOLAX/MIRALAX MIX 1 PACKET OF POWDER (17 GM) IN 8 OZ OF JUICE OR WATER AND DRINK BY MOUTH DAILY   SYSTANE OP Apply 2 drops to eye 2 (two) times daily.  triamcinolone cream 0.1 % Commonly known as:  KENALOG APPLY TO THE AFFECTED AREA(S) ONCE DAILY AS NEEDED            Durable Medical Equipment  (From admission, onward)        Start     Ordered   10/07/17 0849  For home use only DME standard manual wheelchair with seat cushion  Once    Comments:  Patient suffers from ORIF left ankle which impairs their ability to perform daily activities like ambulating in the home.  A cane will not resolve  issue with performing activities of daily living. A wheelchair will allow patient to safely perform daily activities. Patient can safely propel the wheelchair in the home or has a caregiver who can provide assistance.  Accessories: elevating leg rests (ELRs), wheel locks, extensions and antiJasontippers.    10/07/17 0849   10/07/17 0848  For home use only DME 3 n 1  Once     10/07/17 0849   10/07/17 0848  For home use only DME Walker rolling  Once    Question:  Patient needs a walker to treat with the following condition  Answer:  S/P ORIF (open reduction internal fixation) fracture   10/07/17 0849      Disposition and followJasonup:   Jason Woods was discharged from [facility] in Stable condition.  At the hospital follow up visit please address:  1.  Follow up insulin adjustments to manage blood sugar while avoiding           hypoglycemia   2.  Readjustment of BP medications   3.   Follow up chronic cough- if still apparent consider workup   4.  Labs / imaging needed at time of followJasonup: CBG, BMP (Losartan restarted)   5.  Pending labs/ test needing followJasonup: none  FollowJasonup Appointments: FollowJasonup Information    [facility]   Specialty:  Home Health Services Why:   representative from [place] will contact you to arrange start date and time for your therapy. Contact information: [address] [place] [address] [phone]        [name], MD Follow up in 6 day(s).   Specialty:  Orthopedic Surgery Why:  call above phone to schedule appt in [facility] clinic next Thursday with Dr. Verlene Mayer information: [place] [address] [phone]        Glenda Chroman, MD. Schedule an appointment as soon as possible for a visit in 2 week(s).   Specialty:  Internal Medicine Contact information: [place] [address] [phone]           Hospital Course by problem list: Jason Woods is a legally blind 72 yoM with a PMH of diabetes (A1C 5.7) with frequent hypoglycemia, GERD, htn, hld who presented after a fall in which he sustained an open left ankle fracture. At the time of this fall, his blood sugar was reported to be 30, he was dizzy and weak, leading to the  fall.   1. Fracture of Left Ankle:  The patient sustained an open fracture and dislocation of the left ankle through a fall in his hallway. The fracture was identified in the ED and reduction was attempted. However, due to ongoing instability and fracture dislocation, orthopaedics was consulted and the patient was taken to the OR. ORIG of the left ankle was performed. The patient received ancef for 48 hours post  op and remained afebrile throughout the admission. His pain was well controlled on oral medications by the time of discharge and he was able to participate with PT/OT.   -follow up with orthopaedics as outpatient   2. Diabetes: The patient presented with a history a T2DM on insulin with an A1C of 5.7 and the history of frequent hypoglycemia. Due to the likely role of hypoglycemia in the patient's fall, adjustments were made to the patient's regimen as an inpatient. The patient was initially started on 5 units NPH BID with SSI coverage. However, with this strategy, the patient's sugars were 250Jason300. The patient was then switched to 10 units of NPH BIN with SSI overage. However, on this regimen, patient's blood glucose trended 150Jason200 and given the patient's history of hypoglycemia and potential for inconsistent PO intake with ongoing nausea, a regimen with less of a peak and trough effect was thought to be prudent. The patient was discharged on 16 units Lantis qhs for consistent coverage throughout the day and minimized risk of hypoglycemia. -follow up with PCP to ensure adequate control of diabetes and minimize risk of hypoglycemia   3. Hypertension:  The patient's amlodipine, metoprolol, losartan and hydrochlorothiazide were held during admission. During this time the patient's blood pressure remained <140. Given the adequately low BP without medication and the risk of falls due to orthostasis, the patient was not reinitiated on amlodipine, metoprolol or hydrochlorothiazide on discharge. The  patient was initiated on a reduced dose of Losartan 25 mg as an initial attempt at conservative BP management. -follow up to titrate BP medications after patient returns to normal activity   4. Chronic Cough: The patient had a cough for the duration of this admission that was occasionally productive of yellow sputum. It was noted by ortho that copious yellow sputum was expelled from the ET tube during the procedure. The patient had crackles in the right lower lobe on exam. However, he remained afebrile through admission, never had an elevation in WBC and had no evidence of infiltrate on chest xJasonray. There were however, several potential nodules in the lower right lobe on chest xJasonray, which could warrant further investigation or chest CT if the cough becomes chronic.      Discharge Vitals:   Vitals:   10/07/17 2005 10/08/17 0602  BP: 131/64 (!) 161/97  Pulse: 87 94  Resp:    Temp: 98.5 F (36.9 C) 98.7 F (37.1 C)  SpO2: 99% 99%     Pertinent Labs, Studies, and Procedures:   Irrigation and Debridement extremity, Open Reduction and Internal Fixation (ORIF) Ankle Fracture  Dg Ankle 2 Views Left: Result Date: 10/05/2017 CLINICAL DATA:  Post reduction EXAM: LEFT ANKLE - 2 VIEW COMPARISON:  Earlier study 10/05/2017 FINDINGS: Bone detail limited by plaster splint material. Persistent lateral ankle dislocation. Transverse displaced fracture of the medial malleolus. Displaced fracture of the distal fibula. No additional fractures are identified. IMPRESSION: Persistent lateral ankle dislocation and displaced bimalleolar fractures. Electronically Signed   By: Lavonia Dana M.D.   On: 10/05/2017 16:24   Dg Ankle Complete Left: Result Date: 10/05/2017 CLINICAL DATA:  ORIF of left ankle fracture. EXAM: DG CJasonARM 61Jason120 MIN; LEFT ANKLE COMPLETE - 3+ VIEW COMPARISON:  Preoperative radiographs from 10/05/2017 FINDINGS: Four intraoperative fluoroscopic views of the left ankle are provided after ORIF of  bimalleolar fractures. A total 26 seconds of fluoroscopic time was utilized. No immediate intraoperative complications are identified. Improved alignment is seen. IMPRESSION: Fluoroscopic time utilized during ORIF of bimalleolar  fractures of the left ankle. No immediate intraoperative complications noted. Electronically Signed By: Ashley Royalty M.D.   On: 10/05/2017 20:27   Dg CJasonarm 1Jason60 Min: Result Date: 10/05/2017 CLINICAL DATA:  ORIF of left ankle fracture. EXAM: DG CJasonARM 61Jason120 MIN; LEFT ANKLE COMPLETE - 3+ VIEW COMPARISON:  Preoperative radiographs from 10/05/2017 FINDINGS: Four intraoperative fluoroscopic views of the left ankle are provided after ORIF of bimalleolar fractures. A total 26 seconds of fluoroscopic time was utilized. No immediate intraoperative complications are identified. Improved alignment is seen. IMPRESSION: Fluoroscopic time utilized during ORIF of bimalleolar fractures of the left ankle. No immediate intraoperative complications noted. Electronically Signed   By: Ashley Royalty M.D.   On: 10/05/2017 20:27   Chest XJasonray (10/06/17): Normal cardiac silhouette. Central venous pulmonary congestion. No infiltrate or pneumothorax. No acute osseous abnormality  Chest xJasonray (07/16.19): There is mild bilateral lower lobe interstitial prominence, right greater than left. There is no pleural effusion or pneumothorax. The heart and mediastinal contours are unremarkable.The osseous structures are unremarkable.  Discharge Instructions: Discharge Instructions    Call MD for:  redness, tenderness, or signs of infection (pain, swelling, redness, odor or green/yellow discharge around incision site)   Complete by:  As directed    Call MD for:  severe uncontrolled pain   Complete by:  As directed    Call MD for:  temperature >100.4   Complete by:  As directed    Diet - low sodium heart healthy   Complete by:  As directed    Discharge instructions   Complete by:  As directed    Jason Woods,  It was a pleasure taking care of you. Please stop taking all of your blood pressure medications (amlodipine, metoprolol, losartan, and hydrochlorothiazide). We have started you on a lower dose of losartan, 25 mg daily. We have also changed your insulin. Please stop taking your twice daily NPH. Instead, we have started you on a once a day long acting insulin called Lantus. Please inject 10 units nightly before bed. Please continue to check your blood sugar regularly and follow up with your primary care doctor in the next 1Jason2 weeks for hospital follow up. New prescriptions have been sent to your pharmacy.   - John Woods   Increase activity slowly   Complete by:  As directed       Signed: Rhetta Mura, Medical Student 10/08/2017, 11:44 AM   Pager: [phone]  Attestation for Student Documentation:  I personally was present and performed or reJasonperformed the history, physical exam and medical decisionJasonmaking activities of this service and have verified that the service and findings are accurately documented in the student's note.  Velna Ochs, MD 10/11/2017, 7:05 AM

## 2017-10-08 NOTE — Progress Notes (Signed)
Physical Therapy Treatment Patient Details Name: Jason Woods MRN: [id number] DOB: 14-May-1945 Today's Date: 10/08/2017    History of Present Illness Pt is a 72 y.o. male who sustained fall at home with L trimalleolar ankle fx; now s/p L ankle ORIF on 10/05/17. PMH includes HTN, DM, legally blind.    PT Comments    Pt performed transfers and wheelchair mobility as well as education on parts and management of WC.  Daughter present to observe.  Plan for d/c home this pm with support from family.    Follow Up Recommendations  Home health PT;Supervision/Assistance - 24 hour     Equipment Recommendations  Rolling walker with 5" wheels;3in1 (PT);Wheelchair (measurements PT);Other (comment)    Recommendations for Other Services OT consult     Precautions / Restrictions Precautions Precautions: Fall;Other (comment) Precaution Comments: Legally blind (diabetic retinopathy) Required Braces or Orthoses: Other Brace/Splint Other Brace/Splint: post op splint to L LE Restrictions Weight Bearing Restrictions: Yes LLE Weight Bearing: Non weight bearing    Mobility  Bed Mobility               General bed mobility comments: Pt sitting in recliner on arrival.    Transfers Overall transfer level: Needs assistance Equipment used: Rolling walker (2 wheeled) Transfers: Sit to/from [facility] Sit to Stand: Min guard Stand pivot transfers: Min guard       General transfer comment: Cues for hand placement and good technique to maintain L NWB.    Ambulation/Gait Ambulation/Gait assistance: (deferred gait to focus on stand pivot to [facility], wheel chair parts and WC mobility.  )               Theme park manager mobility: Yes Wheelchair propulsion: Both upper extremities Wheelchair parts: Supervision/cueing Distance: 49 ft Wheelchair Assistance Details (indicate cue type and reason): Cues for  application of brakes, arm rests, drive wheels, leg rests with daughter present.    Modified Rankin (Stroke Patients Only)       Balance Overall balance assessment: Needs assistance Sitting-balance support: No upper extremity supported;Feet supported;Feet unsupported Sitting balance-Leahy Scale: Good     Standing balance support: Bilateral upper extremity supported Standing balance-Leahy Scale: Poor Standing balance comment: Reliant on UE support                            Cognition Arousal/Alertness: Awake/alert Behavior During Therapy: WFL for tasks assessed/performed Overall Cognitive Status: No family/caregiver present to determine baseline cognitive functioning Area of Impairment: Safety/judgement;Following commands                   Current Attention Level: Selective     Safety/Judgement: Decreased awareness of deficits;Decreased awareness of safety   Problem Solving: Requires verbal cues;Difficulty sequencing General Comments: Likely baseline cognition      Exercises      General Comments General comments (skin integrity, edema, etc.): daughter present and supportive      Pertinent Vitals/Pain Pain Assessment: No/denies pain Faces Pain Scale: Hurts a little bit Pain Location: L ankle Pain Descriptors / Indicators: Guarding Pain Intervention(s): Monitored during session;Repositioned    Home Living                      Prior Function            PT Goals (current goals can now be  found in the care plan section) Acute Rehab PT Goals Patient Stated Goal: "Be able to sit on my porch and get out of the house" PT Goal Formulation: With patient Potential to Achieve Goals: Good Progress towards PT goals: Progressing toward goals    Frequency    Min 5X/week      PT Plan Current plan remains appropriate    Co-evaluation              AM-PAC PT "6 Clicks" Daily Activity  Outcome Measure  Difficulty turning over in  bed (including adjusting bedclothes, sheets and blankets)?: None Difficulty moving from lying on back to sitting on the side of the bed? : None Difficulty sitting down on and standing up from a chair with arms (e.g., wheelchair, bedside commode, etc,.)?: Unable Help needed moving to and from a bed to chair (including a wheelchair)?: A Little Help needed walking in hospital room?: A Little Help needed climbing 3-5 steps with a railing? : A Lot 6 Click Score: 17    End of Session Equipment Utilized During Treatment: Gait belt Activity Tolerance: Patient limited by fatigue Patient left: in chair;with call bell/phone within reach Nurse Communication: Mobility status PT Visit Diagnosis: Other abnormalities of gait and mobility (R26.89)     Time: 9179-1505 PT Time Calculation (min) (ACUTE ONLY): 24 min  Charges:  $Therapeutic Activity: 8-22 mins $Wheel Chair Management: 8-22 mins                    G Codes:       Governor Rooks, PTA pager [phone]    Cristela Blue 10/08/2017, 1:15 PM

## 2017-10-08 NOTE — Progress Notes (Signed)
   Subjective: 3 Days Post-Op Procedure(s) (LRB): IRRIGATION AND DEBRIDEMENT QPEN ANKLE FRACTURE (Left) OPEN REDUCTION INTERNAL FIXATION TRIMALLEOLAR (ORIF)ANKLE FRACTURE (Left) Patient reports pain as mild and moderate.    Objective: Vital signs in last 24 hours: Temp:  [98.1 F (36.7 C)-98.7 F (37.1 C)] 98.7 F (37.1 C) (07/19 0602) Pulse Rate:  [81-94] 94 (07/19 0602) Resp:  [18] 18 (07/18 1429) BP: (131-161)/(62-97) 161/97 (07/19 0602) SpO2:  [98 %-99 %] 99 % (07/19 0602)  Intake/Output from previous day: 07/18 0701 - 07/19 0700 In: 1940.3 [P.O.:1560; I.V.:80.3; IV Piggyback:300] Out: 2575 [Urine:2575] Intake/Output this shift: No intake/output data recorded.  Recent Labs    10/05/17 1224 10/06/17 0446 10/07/17 0405  HGB 10.5* 9.8* 9.8*   Recent Labs    10/06/17 0446 10/07/17 0405  WBC 9.6 9.1  RBC 3.46* 3.45*  HCT 30.3* 29.5*  PLT 178 212   Recent Labs    10/06/17 1831 10/08/17 0415  NA 128* 135  K 3.5 3.3*  CL 89* 97*  CO2 29 29  BUN 14 7*  CREATININE 1.00 0.77  GLUCOSE 295* 183*  CALCIUM 8.4* 8.3*   No results for input(s): LABPT, INR in the last 72 hours.  Neurologically intact No results found.  Assessment/Plan: 3 Days Post-Op Procedure(s) (LRB): IRRIGATION AND DEBRIDEMENT QPEN ANKLE FRACTURE (Left) OPEN REDUCTION INTERNAL FIXATION TRIMALLEOLAR (ORIF)ANKLE FRACTURE (Left) Discharge home with home health  Jason Woods 10/08/2017, 10:29 AM

## 2017-10-08 NOTE — Progress Notes (Signed)
Occupational Therapy Treatment Patient Details Name: Jason Woods MRN: [id number] DOB: 11/25/1945 Today's Date: 10/08/2017    History of present illness Pt is a 72 y.o. male who sustained fall at home with L trimalleolar ankle fx; now s/p L ankle ORIF on 10/05/17. PMH includes HTN, DM, legally blind.   OT comments  Patient progressing well.  Daughter present during session and reports he will have 24/7 care at discharge.  Continued educated on safety with lateral lean techniques for LB bathing and dressing, having assistance for mobility and self care, bathing seated at sink, using 3:1 next to bed at night or just using urinal, and hand placement during transfers and pacing.  Continues to require min guard to minA for transfers, min cueing to maintain NWB L LE. Recommendations below.  Will continue to follow while admitted.    Follow Up Recommendations  Home health OT;Supervision/Assistance - 24 hour    Equipment Recommendations  3 in 1 bedside commode    Recommendations for Other Services      Precautions / Restrictions Precautions Precautions: Fall;Other (comment) Precaution Comments: Legally blind (diabetic retinopathy) Required Braces or Orthoses: Other Brace/Splint Other Brace/Splint: post op splint to L LE Restrictions Weight Bearing Restrictions: Yes LLE Weight Bearing: Non weight bearing       Mobility Bed Mobility               General bed mobility comments: Pt sitting in recliner on arrival.    Transfers Overall transfer level: Needs assistance Equipment used: Rolling walker (2 wheeled) Transfers: Sit to/from [facility] Sit to Stand: Min guard Stand pivot transfers: Min guard       General transfer comment: Cues for hand placement and good technique to maintain L NWB.      Balance Overall balance assessment: Needs assistance Sitting-balance support: No upper extremity supported;Feet supported;Feet unsupported Sitting  balance-Leahy Scale: Good     Standing balance support: Bilateral upper extremity supported Standing balance-Leahy Scale: Poor Standing balance comment: Reliant on UE support                           ADL either performed or assessed with clinical judgement   ADL Overall ADL's : Needs assistance/impaired             Lower Body Bathing: Minimal assistance;Sitting/lateral leans;Sit to/from stand Lower Body Bathing Details (indicate cue type and reason): reviewed safety and completing seated sitting at sink; educated on lateral leans to maximize safety due to impaired balance          Toilet Transfer: Minimal assistance;Cueing for sequencing;Cueing for safety;Stand-pivot;BSC;RW Toilet Transfer Details (indicate cue type and reason): 3:1 stand pivot using RW; discussed safety and use of 3:1 at dc; using urinal vs transfer to 3:1 with RW; continues to require cueing for sequencing  Toileting- Clothing Manipulation and Hygiene: Sitting/lateral lean;Minimal assistance;Sit to/from stand Toileting - Clothing Manipulation Details (indicate cue type and reason): reviewed safety and techniques to maximize safety (lateral leans or standing 1 handed technique) continues to require min assist for balance    Tub/Shower Transfer Details (indicate cue type and reason): agreeable to sponge bath Functional mobility during ADLs: Minimal assistance;Rolling walker(stand pivots only)       Vision       Perception     Praxis      Cognition Arousal/Alertness: Awake/alert Behavior During Therapy: WFL for tasks assessed/performed Overall Cognitive Status: No family/caregiver present to determine baseline cognitive functioning  Area of Impairment: Safety/judgement;Following commands                   Current Attention Level: Selective     Safety/Judgement: Decreased awareness of deficits;Decreased awareness of safety   Problem Solving: Requires verbal cues;Difficulty  sequencing General Comments: Likely baseline cognition        Exercises     Shoulder Instructions       General Comments daughter present and supportive    Pertinent Vitals/ Pain       Pain Assessment: No/denies pain Faces Pain Scale: Hurts a little bit Pain Location: L ankle Pain Descriptors / Indicators: Guarding Pain Intervention(s): Monitored during session;Repositioned  Home Living                                          Prior Functioning/Environment              Frequency  Min 2X/week        Progress Toward Goals  OT Goals(current goals can now be found in the care plan section)  Progress towards OT goals: Progressing toward goals  Acute Rehab OT Goals Patient Stated Goal: "Be able to sit on my porch and get out of the house" OT Goal Formulation: With patient Time For Goal Achievement: 10/20/17 Potential to Achieve Goals: Good  Plan Discharge plan remains appropriate;Frequency remains appropriate    Co-evaluation                 AM-PAC PT "6 Clicks" Daily Activity     Outcome Measure   Help from another person eating meals?: None Help from another person taking care of personal grooming?: A Little Help from another person toileting, which includes using toliet, bedpan, or urinal?: A Lot Help from another person bathing (including washing, rinsing, drying)?: A Little Help from another person to put on and taking off regular upper body clothing?: None Help from another person to put on and taking off regular lower body clothing?: A Lot 6 Click Score: 18    End of Session Equipment Utilized During Treatment: Gait belt;Rolling walker  OT Visit Diagnosis: Unsteadiness on feet (R26.81);Other abnormalities of gait and mobility (R26.89)   Activity Tolerance Patient tolerated treatment well   Patient Left in chair;with family/visitor present;with call bell/phone within reach   Nurse Communication          Time:  1119-1140 OT Time Calculation (min): 21 min  Charges: OT General Charges $OT Visit: 1 Visit OT Treatments $Self Care/Home Management : 8-22 mins  Delight Stare, OTR/L  Pager [place] 10/08/2017, 1:14 PM

## 2017-10-08 NOTE — Progress Notes (Signed)
  Date: 10/08/2017  Patient name: HILLIS MCPHATTER  Medical record number: [id number]  Date of birth: [birth year]   I have seen and evaluated this patient and I have discussed the plan of care with the house staff. Please see their note for complete details. I concur with their findings with the following additions/corrections: Diona Fanti was seen on AM rounds with team. I agree with MS4 Cunningham's plan to modify insulin to lantus which will not have a peak like NPH and therefore, less likely to cause hypoglycemia. This is important as pts oral intake might be affected by his nausea. Agree with additional of losartan but not other 3 HTN meds. Have not seen ortho's note today for their dispo plans. Pt still wanting home despite limited home health services. Stable from IM position for D/C whenever stable from ortho point with outpt PCP F/U for DM and HTN FU.  Bartholomew Crews, MD 10/08/2017, 9:57 AM

## 2017-10-08 NOTE — Care Management Important Message (Signed)
Important Message  Patient Details  Name: Jason Woods MRN: [id number] Date of Birth: 03-28-1945   Medicare Important Message Given:  Yes    [name] 10/08/2017, 3:32 PM

## 2017-10-08 NOTE — Progress Notes (Addendum)
Subjective:  Jason Woods endorses nausea today. He says that this is an issue that he has struggled with for months now. He has not vomited since admission to the hospital. He does not relate the nausea to eating and he says that it might be because of all the medications he takes. He has a history of "having his esophagus stretched" and states that the nausea initially improved after this procedure. However, he does not endorse worsening dysphagia at the moment. The patient endorses pain in his leg, but states that he can still wiggle his toes. He had a bowel movement yesterday and denies abdominal pain, chest pain or shortness of breath.   The patient, once again, expressed his desire to go home as opposed to a SNF, even with the knowledge that he is likely to have limited assistance in this setting.   Objective:  Vital signs in last 24 hours: Vitals:   10/07/17 0513 10/07/17 1429 10/07/17 2005 10/08/17 0602  BP: 132/71 133/62 131/64 (!) 161/97  Pulse: 81 81 87 94  Resp: 16 18    Temp: 98.1 F (36.7 C) 98.1 F (36.7 C) 98.5 F (36.9 C) 98.7 F (37.1 C)  TempSrc: Oral Oral Oral Oral  SpO2: 100% 98% 99% 99%  Weight:      Height:       Weight change:   Intake/Output Summary (Last 24 hours) at 10/08/2017 8786 Last data filed at 10/08/2017 0211 Gross per 24 hour  Intake 1460.33 ml  Output 2175 ml  Net -714.67 ml   Physical Exam:  General: No acute distress, patient eating breakfast before rounds  Pulm: No increased work of breathing, crackles in the right lower lung field, more fine crackles LL lung field, upper lungs CAB  CV: frequent ectopic beats making a distinguished rhythm had to discern, nMRG  Abd: BS+, NTND  Ext: soft cast and ice back on lower left extremity   Assessment/Plan:  Active Problems:   Open left ankle fracture   Open trimalleolar fracture of ankle, left, type I or II, initial encounter  Ankle Fracture Post-op Day One:  The patient did not endorse pain  or request improved pain management. The surgical site was wrapped thoroughly to with ice resting on the bandage.  -management of surgical site per orthopaedics -pain management per orthopaedics   Diabetes:  Given the patient's A1C of 5.7, it is likely that he is hypoglycemic a substantial portion of the time. Yesterday the patient was switched from 5 units to 10 units NPH. With this change he maintained BG of 150-200, an optimal range for this patient where avoiding hypoglycemia is a priority. Given the patient's history of hypoglycemia, we will switch from NPH to Lantis to avoid peaks in the effect of the insulin and have consistent basal coverage over a 24 hour period.  -insulin Aspart 0-9 TID w meals  -Lantis 16 units qhs   Productive Cough  The patient mentions an ongoing productive cough as well as pleuritic pain and pain in his throat. The patient's lung exam has consistently been significant for crackles in the right lower lung, with the absence of fever, white count, or other findings on chest x-ray suggestive of pneumonia. -incentive spirometry to prevent post-op atelectasis and flutter valve to help clear mucous -on hospital follow up with PCP will recommend that if cough is ongoing, to pursue work up for chronic cough, (ie PFTs, CT; also has esophageal stricture so GERD could be component)- note potential nodules on  chest x-ray (10/05/17) that correspond to the pulmonary exam   Hypertension:  The patient had a blood pressure of 673 systolic. However, for the most part this admission, his Bps have been well controlled on no medication. We will reinitiate Losartan at a lower dose than previously taken. Consider relaxing BP goal in patient with BPs under 140 with a history of falls  -hold home hydrochlorothiazide, metoprolol,  amlodipine for now, may restart if BPs start trending up  -Restart Losartan 25 mg   Tabacco use: - Nicotine patch  FEN: replete lytes prn, carb modified diet    VTE ppx: Lovenox Code Status: FULL    LOS: 3 days   Rhetta Mura, Medical Student 10/08/2017, 9:04 AM  Attestation for Student Documentation:  I personally was present and performed or re-performed the history, physical exam and medical decision-making activities of this service and have verified that the service and findings are accurately documented in the student's note.  Velna Ochs, MD 10/08/2017, 10:17 AM

## 2017-10-10 DIAGNOSIS — S93432D Sprain of tibiofibular ligament of left ankle, subsequent encounter: Secondary | ICD-10-CM | POA: Diagnosis not present

## 2017-10-10 DIAGNOSIS — F17210 Nicotine dependence, cigarettes, uncomplicated: Secondary | ICD-10-CM | POA: Diagnosis not present

## 2017-10-10 DIAGNOSIS — Z9181 History of falling: Secondary | ICD-10-CM | POA: Diagnosis not present

## 2017-10-10 DIAGNOSIS — W19XXXD Unspecified fall, subsequent encounter: Secondary | ICD-10-CM | POA: Diagnosis not present

## 2017-10-10 DIAGNOSIS — E11649 Type 2 diabetes mellitus with hypoglycemia without coma: Secondary | ICD-10-CM | POA: Diagnosis not present

## 2017-10-10 DIAGNOSIS — Z79899 Other long term (current) drug therapy: Secondary | ICD-10-CM | POA: Diagnosis not present

## 2017-10-10 DIAGNOSIS — K219 Gastro-esophageal reflux disease without esophagitis: Secondary | ICD-10-CM | POA: Diagnosis not present

## 2017-10-10 DIAGNOSIS — S82852E Displaced trimalleolar fracture of left lower leg, subsequent encounter for open fracture type I or II with routine healing: Secondary | ICD-10-CM | POA: Diagnosis not present

## 2017-10-10 DIAGNOSIS — I10 Essential (primary) hypertension: Secondary | ICD-10-CM | POA: Diagnosis not present

## 2017-10-10 DIAGNOSIS — Z794 Long term (current) use of insulin: Secondary | ICD-10-CM | POA: Diagnosis not present

## 2017-10-10 DIAGNOSIS — H548 Legal blindness, as defined in USA: Secondary | ICD-10-CM | POA: Diagnosis not present

## 2017-10-11 ENCOUNTER — Telehealth (INDEPENDENT_AMBULATORY_CARE_PROVIDER_SITE_OTHER): Payer: Self-pay | Admitting: Orthopaedic Surgery

## 2017-10-11 NOTE — Telephone Encounter (Signed)
Ok for orders? 

## 2017-10-11 NOTE — Telephone Encounter (Signed)
I called Ben and gave verbal orders.

## 2017-10-11 NOTE — Telephone Encounter (Signed)
Ok, thank you

## 2017-10-11 NOTE — Telephone Encounter (Signed)
[place]  [phone]    Verbal Orders  Continue Therapy   2 times a week for  3 weeks

## 2017-10-13 DIAGNOSIS — S93432D Sprain of tibiofibular ligament of left ankle, subsequent encounter: Secondary | ICD-10-CM | POA: Diagnosis not present

## 2017-10-13 DIAGNOSIS — E11649 Type 2 diabetes mellitus with hypoglycemia without coma: Secondary | ICD-10-CM | POA: Diagnosis not present

## 2017-10-13 DIAGNOSIS — K219 Gastro-esophageal reflux disease without esophagitis: Secondary | ICD-10-CM | POA: Diagnosis not present

## 2017-10-13 DIAGNOSIS — I10 Essential (primary) hypertension: Secondary | ICD-10-CM | POA: Diagnosis not present

## 2017-10-13 DIAGNOSIS — H548 Legal blindness, as defined in USA: Secondary | ICD-10-CM | POA: Diagnosis not present

## 2017-10-13 DIAGNOSIS — S82852E Displaced trimalleolar fracture of left lower leg, subsequent encounter for open fracture type I or II with routine healing: Secondary | ICD-10-CM | POA: Diagnosis not present

## 2017-10-14 ENCOUNTER — Ambulatory Visit (INDEPENDENT_AMBULATORY_CARE_PROVIDER_SITE_OTHER): Payer: Medicare Other | Admitting: Orthopaedic Surgery

## 2017-10-14 ENCOUNTER — Encounter (INDEPENDENT_AMBULATORY_CARE_PROVIDER_SITE_OTHER): Payer: Self-pay | Admitting: Orthopaedic Surgery

## 2017-10-14 VITALS — BP 133/73 | HR 96 | Ht 67.0 in | Wt 175.0 lb

## 2017-10-14 DIAGNOSIS — S82852B Displaced trimalleolar fracture of left lower leg, initial encounter for open fracture type I or II: Secondary | ICD-10-CM | POA: Diagnosis not present

## 2017-10-14 MED ORDER — OXYCODONE-ACETAMINOPHEN 5-325 MG PO TABS: 1.0000 | ORAL_TABLET | Freq: Four times a day (QID) | ORAL | 0 refills | Status: DC | PRN

## 2017-10-14 NOTE — Addendum Note (Signed)
Addended by: Meyer Cory on: 10/14/2017 11:46 AM   Modules accepted: Orders

## 2017-10-14 NOTE — Progress Notes (Signed)
   Post-Op Visit Note   Patient: Jason Woods           Date of Birth: [birth year]           MRN: [id number] Visit Date: 10/14/2017 PCP: Glenda Chroman, MD   [place] one week for xrays and likely straple removal.   Chief Complaint:  Chief Complaint  Patient presents with  . Left Ankle - Routine Post Op    10/05/17 I&D Open Ankle Fracture, ORIF Left Trimalleolar Ankle Fracture   Visit Diagnoses:  1. Open trimalleolar fracture of ankle, left, type I or II, initial encounter     Plan: return one week   Follow-Up Instructions: Return in about 1 week (around 10/21/2017).   Orders:  No orders of the defined types were placed in this encounter.  No orders of the defined types were placed in this encounter.   Imaging: No results found.  PMFS History: Patient Active Problem List   Diagnosis Date Noted  . Open left ankle fracture 10/05/2017  . Open trimalleolar fracture of ankle, left, type I or II, initial encounter 10/05/2017  . PNA (pneumonia) 04/29/2017  . Esophageal dysphagia 03/31/2017  . Abnormal esophagram 03/31/2017  . Diabetes ([place]) 03/09/2017  . Vitamin B deficiency 03/09/2017  . High cholesterol   . Hypertension   . GERD (gastroesophageal reflux disease)    Past Medical History:  Diagnosis Date  . Diabetes ([place])   . Diabetes ([place]) 03/09/2017  . GERD (gastroesophageal reflux disease)   . High cholesterol   . History of kidney stones   . Hypertension   . Legally blind   . Vitamin B deficiency 03/09/2017    Family History  Problem Relation Age of Onset  . Diabetes Mother   . Heart attack Father   . Diabetes Sister   . Diabetes Brother     Past Surgical History:  Procedure Laterality Date  . BIOPSY  04/23/2017   Procedure: BIOPSY;  Surgeon: Rogene Houston, MD;  [facility];  Service: Endoscopy;;  gastric  . ESOPHAGEAL DILATION N/A 04/23/2017   Procedure: ESOPHAGEAL DILATION;  Surgeon: Rogene Houston, MD;  [facility];  Service: Endoscopy;  Laterality: N/A;  . ESOPHAGOGASTRODUODENOSCOPY N/A 04/23/2017   Procedure: ESOPHAGOGASTRODUODENOSCOPY (EGD);  Surgeon: Rogene Houston, MD;  [facility];  Service: Endoscopy;  Laterality: N/A;  9:55-moved to 1040 per Ann  . I&D EXTREMITY Left 10/05/2017   Procedure: IRRIGATION AND DEBRIDEMENT QPEN ANKLE FRACTURE;  Surgeon: [name], MD;  Location: [place];  Service: Orthopedics;  Laterality: Left;  . left hand surgery     wrist fx about 5-6 months ago (2018)  . ORIF ANKLE FRACTURE Left 10/05/2017   Procedure: OPEN REDUCTION INTERNAL FIXATION TRIMALLEOLAR (ORIF)ANKLE FRACTURE;  Surgeon: [name], MD;  Location: [place];  Service: Orthopedics;  Laterality: Left;   Social History   Occupational History  . Not on file  Tobacco Use  . Smoking status: Current Every Day Smoker    Packs/day: 0.50    Types: Cigarettes  . Smokeless tobacco: Never Used  . Tobacco comment: 1 pack a day x 60 yrs  Substance and Sexual Activity  . Alcohol use: No    Frequency: Never  . Drug use: No  . Sexual activity: Not on file

## 2017-10-18 DIAGNOSIS — K219 Gastro-esophageal reflux disease without esophagitis: Secondary | ICD-10-CM | POA: Diagnosis not present

## 2017-10-18 DIAGNOSIS — Z299 Encounter for prophylactic measures, unspecified: Secondary | ICD-10-CM | POA: Diagnosis not present

## 2017-10-18 DIAGNOSIS — H548 Legal blindness, as defined in USA: Secondary | ICD-10-CM | POA: Diagnosis not present

## 2017-10-18 DIAGNOSIS — S82852E Displaced trimalleolar fracture of left lower leg, subsequent encounter for open fracture type I or II with routine healing: Secondary | ICD-10-CM | POA: Diagnosis not present

## 2017-10-18 DIAGNOSIS — Z6827 Body mass index (BMI) 27.0-27.9, adult: Secondary | ICD-10-CM | POA: Diagnosis not present

## 2017-10-18 DIAGNOSIS — E1142 Type 2 diabetes mellitus with diabetic polyneuropathy: Secondary | ICD-10-CM | POA: Diagnosis not present

## 2017-10-18 DIAGNOSIS — S93432D Sprain of tibiofibular ligament of left ankle, subsequent encounter: Secondary | ICD-10-CM | POA: Diagnosis not present

## 2017-10-18 DIAGNOSIS — E1165 Type 2 diabetes mellitus with hyperglycemia: Secondary | ICD-10-CM | POA: Diagnosis not present

## 2017-10-18 DIAGNOSIS — I10 Essential (primary) hypertension: Secondary | ICD-10-CM | POA: Diagnosis not present

## 2017-10-18 DIAGNOSIS — E11649 Type 2 diabetes mellitus with hypoglycemia without coma: Secondary | ICD-10-CM | POA: Diagnosis not present

## 2017-10-18 DIAGNOSIS — R55 Syncope and collapse: Secondary | ICD-10-CM | POA: Diagnosis not present

## 2017-10-21 ENCOUNTER — Encounter (INDEPENDENT_AMBULATORY_CARE_PROVIDER_SITE_OTHER): Payer: Self-pay | Admitting: Orthopaedic Surgery

## 2017-10-21 ENCOUNTER — Ambulatory Visit (INDEPENDENT_AMBULATORY_CARE_PROVIDER_SITE_OTHER): Payer: Medicare Other | Admitting: Orthopaedic Surgery

## 2017-10-21 DIAGNOSIS — E11649 Type 2 diabetes mellitus with hypoglycemia without coma: Secondary | ICD-10-CM | POA: Diagnosis not present

## 2017-10-21 DIAGNOSIS — S82852E Displaced trimalleolar fracture of left lower leg, subsequent encounter for open fracture type I or II with routine healing: Secondary | ICD-10-CM | POA: Diagnosis not present

## 2017-10-21 DIAGNOSIS — K219 Gastro-esophageal reflux disease without esophagitis: Secondary | ICD-10-CM | POA: Diagnosis not present

## 2017-10-21 DIAGNOSIS — S93432D Sprain of tibiofibular ligament of left ankle, subsequent encounter: Secondary | ICD-10-CM | POA: Diagnosis not present

## 2017-10-21 DIAGNOSIS — I10 Essential (primary) hypertension: Secondary | ICD-10-CM | POA: Diagnosis not present

## 2017-10-21 DIAGNOSIS — S82892E Other fracture of left lower leg, subsequent encounter for open fracture type I or II with routine healing: Secondary | ICD-10-CM

## 2017-10-21 DIAGNOSIS — H548 Legal blindness, as defined in USA: Secondary | ICD-10-CM | POA: Diagnosis not present

## 2017-10-21 NOTE — Progress Notes (Signed)
Post-Op Visit Note   Patient: Jason Woods           Date of Birth: Aug 09, 1945           MRN: [id number] Visit Date: 10/21/2017 PCP: Glenda Chroman, MD   Assessment & Plan: Return 5 weeks for cast off three-view x-rays left ankle in cam boot application.  We can discuss scheduling syndesmotic screw removal at around 8 weeks post injury.  Patient requested refill Percocet.  We will switch him to Ultram 1 p.o. twice daily as needed number 30 tablets.  Chief Complaint: No chief complaint on file.  Visit Diagnoses:  1. Type I or II open fracture of left ankle with routine healing, subsequent encounter     Plan: Staples removed Steri-Strips applied short leg fiberglass cast applied.  Return 5 weeks for cast off x-rays out of cast and then cam boot application.  He will need to have the syndesmotic screw removed 8 weeks postop.  Follow-Up Instructions: No follow-ups on file.   Orders:  No orders of the defined types were placed in this encounter.  No orders of the defined types were placed in this encounter.   Imaging: No results found.  PMFS History: Patient Active Problem List   Diagnosis Date Noted  . Open left ankle fracture 10/05/2017  . Open trimalleolar fracture of ankle, left, type I or II, initial encounter 10/05/2017  . PNA (pneumonia) 04/29/2017  . Esophageal dysphagia 03/31/2017  . Abnormal esophagram 03/31/2017  . Diabetes ([place]) 03/09/2017  . Vitamin B deficiency 03/09/2017  . High cholesterol   . Hypertension   . GERD (gastroesophageal reflux disease)    Past Medical History:  Diagnosis Date  . Diabetes ([place])   . Diabetes ([place]) 03/09/2017  . GERD (gastroesophageal reflux disease)   . High cholesterol   . History of kidney stones   . Hypertension   . Legally blind   . Vitamin B deficiency 03/09/2017    Family History  Problem Relation Age of Onset  . Diabetes Mother   . Heart attack Father   . Diabetes Sister   . Diabetes Brother     Past  Surgical History:  Procedure Laterality Date  . BIOPSY  04/23/2017   Procedure: BIOPSY;  Surgeon: Rogene Houston, MD;  [facility];  Service: Endoscopy;;  gastric  . ESOPHAGEAL DILATION N/A 04/23/2017   Procedure: ESOPHAGEAL DILATION;  Surgeon: Rogene Houston, MD;  [facility];  Service: Endoscopy;  Laterality: N/A;  . ESOPHAGOGASTRODUODENOSCOPY N/A 04/23/2017   Procedure: ESOPHAGOGASTRODUODENOSCOPY (EGD);  Surgeon: Rogene Houston, MD;  [facility];  Service: Endoscopy;  Laterality: N/A;  9:55-moved to 1040 per Ann  . I&D EXTREMITY Left 10/05/2017   Procedure: IRRIGATION AND DEBRIDEMENT QPEN ANKLE FRACTURE;  Surgeon: [name], MD;  Location: [place];  Service: Orthopedics;  Laterality: Left;  . left hand surgery     wrist fx about 5-6 months ago (2018)  . ORIF ANKLE FRACTURE Left 10/05/2017   Procedure: OPEN REDUCTION INTERNAL FIXATION TRIMALLEOLAR (ORIF)ANKLE FRACTURE;  Surgeon: [name], MD;  Location: [place];  Service: Orthopedics;  Laterality: Left;   Social History   Occupational History  . Not on file  Tobacco Use  . Smoking status: Current Every Day Smoker    Packs/day: 0.50    Types: Cigarettes  . Smokeless tobacco: Never Used  . Tobacco comment: 1 pack a day x 60 yrs  Substance and Sexual  Activity  . Alcohol use: No    Frequency: Never  . Drug use: No  . Sexual activity: Not on file

## 2017-10-25 DIAGNOSIS — H548 Legal blindness, as defined in USA: Secondary | ICD-10-CM | POA: Diagnosis not present

## 2017-10-25 DIAGNOSIS — S82852E Displaced trimalleolar fracture of left lower leg, subsequent encounter for open fracture type I or II with routine healing: Secondary | ICD-10-CM | POA: Diagnosis not present

## 2017-10-25 DIAGNOSIS — S93432D Sprain of tibiofibular ligament of left ankle, subsequent encounter: Secondary | ICD-10-CM | POA: Diagnosis not present

## 2017-10-25 DIAGNOSIS — I10 Essential (primary) hypertension: Secondary | ICD-10-CM | POA: Diagnosis not present

## 2017-10-25 DIAGNOSIS — K219 Gastro-esophageal reflux disease without esophagitis: Secondary | ICD-10-CM | POA: Diagnosis not present

## 2017-10-25 DIAGNOSIS — E11649 Type 2 diabetes mellitus with hypoglycemia without coma: Secondary | ICD-10-CM | POA: Diagnosis not present

## 2017-10-28 DIAGNOSIS — M25532 Pain in left wrist: Secondary | ICD-10-CM | POA: Diagnosis not present

## 2017-10-28 DIAGNOSIS — M545 Low back pain: Secondary | ICD-10-CM | POA: Diagnosis not present

## 2017-10-28 DIAGNOSIS — M25559 Pain in unspecified hip: Secondary | ICD-10-CM | POA: Diagnosis not present

## 2017-10-28 DIAGNOSIS — Z79891 Long term (current) use of opiate analgesic: Secondary | ICD-10-CM | POA: Diagnosis not present

## 2017-10-28 DIAGNOSIS — M5010 Cervical disc disorder with radiculopathy, unspecified cervical region: Secondary | ICD-10-CM | POA: Diagnosis not present

## 2017-10-29 ENCOUNTER — Telehealth (INDEPENDENT_AMBULATORY_CARE_PROVIDER_SITE_OTHER): Payer: Self-pay | Admitting: Orthopaedic Surgery

## 2017-10-29 DIAGNOSIS — S82852E Displaced trimalleolar fracture of left lower leg, subsequent encounter for open fracture type I or II with routine healing: Secondary | ICD-10-CM | POA: Diagnosis not present

## 2017-10-29 DIAGNOSIS — H548 Legal blindness, as defined in USA: Secondary | ICD-10-CM | POA: Diagnosis not present

## 2017-10-29 DIAGNOSIS — S93432D Sprain of tibiofibular ligament of left ankle, subsequent encounter: Secondary | ICD-10-CM | POA: Diagnosis not present

## 2017-10-29 DIAGNOSIS — I10 Essential (primary) hypertension: Secondary | ICD-10-CM | POA: Diagnosis not present

## 2017-10-29 DIAGNOSIS — E11649 Type 2 diabetes mellitus with hypoglycemia without coma: Secondary | ICD-10-CM | POA: Diagnosis not present

## 2017-10-29 DIAGNOSIS — K219 Gastro-esophageal reflux disease without esophagitis: Secondary | ICD-10-CM | POA: Diagnosis not present

## 2017-10-29 NOTE — Telephone Encounter (Signed)
Jason Woods, PT at [facility] is requesting to extend physical therapy   1 x for 5 weeks  CB # [phone]

## 2017-10-29 NOTE — Telephone Encounter (Signed)
Please advise on orders. OK to extend?

## 2017-11-01 DIAGNOSIS — Z299 Encounter for prophylactic measures, unspecified: Secondary | ICD-10-CM | POA: Diagnosis not present

## 2017-11-01 DIAGNOSIS — E11319 Type 2 diabetes mellitus with unspecified diabetic retinopathy without macular edema: Secondary | ICD-10-CM | POA: Diagnosis not present

## 2017-11-01 DIAGNOSIS — E1142 Type 2 diabetes mellitus with diabetic polyneuropathy: Secondary | ICD-10-CM | POA: Diagnosis not present

## 2017-11-01 DIAGNOSIS — I10 Essential (primary) hypertension: Secondary | ICD-10-CM | POA: Diagnosis not present

## 2017-11-01 DIAGNOSIS — Z6827 Body mass index (BMI) 27.0-27.9, adult: Secondary | ICD-10-CM | POA: Diagnosis not present

## 2017-11-01 DIAGNOSIS — E1165 Type 2 diabetes mellitus with hyperglycemia: Secondary | ICD-10-CM | POA: Diagnosis not present

## 2017-11-01 DIAGNOSIS — J069 Acute upper respiratory infection, unspecified: Secondary | ICD-10-CM | POA: Diagnosis not present

## 2017-11-02 DIAGNOSIS — E11649 Type 2 diabetes mellitus with hypoglycemia without coma: Secondary | ICD-10-CM | POA: Diagnosis not present

## 2017-11-02 DIAGNOSIS — S82852E Displaced trimalleolar fracture of left lower leg, subsequent encounter for open fracture type I or II with routine healing: Secondary | ICD-10-CM | POA: Diagnosis not present

## 2017-11-02 DIAGNOSIS — S93432D Sprain of tibiofibular ligament of left ankle, subsequent encounter: Secondary | ICD-10-CM | POA: Diagnosis not present

## 2017-11-02 DIAGNOSIS — H548 Legal blindness, as defined in USA: Secondary | ICD-10-CM | POA: Diagnosis not present

## 2017-11-02 DIAGNOSIS — K219 Gastro-esophageal reflux disease without esophagitis: Secondary | ICD-10-CM | POA: Diagnosis not present

## 2017-11-02 DIAGNOSIS — I10 Essential (primary) hypertension: Secondary | ICD-10-CM | POA: Diagnosis not present

## 2017-11-02 NOTE — Telephone Encounter (Signed)
OK - thanks

## 2017-11-02 NOTE — Telephone Encounter (Signed)
I called Debbie and advised. 

## 2017-11-05 ENCOUNTER — Other Ambulatory Visit: Payer: Self-pay | Admitting: Internal Medicine

## 2017-11-09 DIAGNOSIS — E11649 Type 2 diabetes mellitus with hypoglycemia without coma: Secondary | ICD-10-CM | POA: Diagnosis not present

## 2017-11-09 DIAGNOSIS — K219 Gastro-esophageal reflux disease without esophagitis: Secondary | ICD-10-CM | POA: Diagnosis not present

## 2017-11-09 DIAGNOSIS — I10 Essential (primary) hypertension: Secondary | ICD-10-CM | POA: Diagnosis not present

## 2017-11-09 DIAGNOSIS — S93432D Sprain of tibiofibular ligament of left ankle, subsequent encounter: Secondary | ICD-10-CM | POA: Diagnosis not present

## 2017-11-09 DIAGNOSIS — S82852E Displaced trimalleolar fracture of left lower leg, subsequent encounter for open fracture type I or II with routine healing: Secondary | ICD-10-CM | POA: Diagnosis not present

## 2017-11-09 DIAGNOSIS — H548 Legal blindness, as defined in USA: Secondary | ICD-10-CM | POA: Diagnosis not present

## 2017-11-11 ENCOUNTER — Other Ambulatory Visit: Payer: Self-pay | Admitting: Internal Medicine

## 2017-11-15 ENCOUNTER — Encounter (INDEPENDENT_AMBULATORY_CARE_PROVIDER_SITE_OTHER): Payer: Self-pay | Admitting: Internal Medicine

## 2017-11-15 ENCOUNTER — Ambulatory Visit (INDEPENDENT_AMBULATORY_CARE_PROVIDER_SITE_OTHER): Payer: Medicare Other | Admitting: Internal Medicine

## 2017-11-17 DIAGNOSIS — K219 Gastro-esophageal reflux disease without esophagitis: Secondary | ICD-10-CM | POA: Diagnosis not present

## 2017-11-17 DIAGNOSIS — S82852E Displaced trimalleolar fracture of left lower leg, subsequent encounter for open fracture type I or II with routine healing: Secondary | ICD-10-CM | POA: Diagnosis not present

## 2017-11-17 DIAGNOSIS — S93432D Sprain of tibiofibular ligament of left ankle, subsequent encounter: Secondary | ICD-10-CM | POA: Diagnosis not present

## 2017-11-17 DIAGNOSIS — I10 Essential (primary) hypertension: Secondary | ICD-10-CM | POA: Diagnosis not present

## 2017-11-17 DIAGNOSIS — H548 Legal blindness, as defined in USA: Secondary | ICD-10-CM | POA: Diagnosis not present

## 2017-11-17 DIAGNOSIS — E11649 Type 2 diabetes mellitus with hypoglycemia without coma: Secondary | ICD-10-CM | POA: Diagnosis not present

## 2017-11-23 DIAGNOSIS — E119 Type 2 diabetes mellitus without complications: Secondary | ICD-10-CM | POA: Diagnosis not present

## 2017-11-23 DIAGNOSIS — I10 Essential (primary) hypertension: Secondary | ICD-10-CM | POA: Diagnosis not present

## 2017-11-23 DIAGNOSIS — E7800 Pure hypercholesterolemia, unspecified: Secondary | ICD-10-CM | POA: Diagnosis not present

## 2017-11-24 DIAGNOSIS — I10 Essential (primary) hypertension: Secondary | ICD-10-CM | POA: Diagnosis not present

## 2017-11-24 DIAGNOSIS — S82852E Displaced trimalleolar fracture of left lower leg, subsequent encounter for open fracture type I or II with routine healing: Secondary | ICD-10-CM | POA: Diagnosis not present

## 2017-11-24 DIAGNOSIS — H548 Legal blindness, as defined in USA: Secondary | ICD-10-CM | POA: Diagnosis not present

## 2017-11-24 DIAGNOSIS — S93432D Sprain of tibiofibular ligament of left ankle, subsequent encounter: Secondary | ICD-10-CM | POA: Diagnosis not present

## 2017-11-24 DIAGNOSIS — E11649 Type 2 diabetes mellitus with hypoglycemia without coma: Secondary | ICD-10-CM | POA: Diagnosis not present

## 2017-11-24 DIAGNOSIS — K219 Gastro-esophageal reflux disease without esophagitis: Secondary | ICD-10-CM | POA: Diagnosis not present

## 2017-11-25 ENCOUNTER — Encounter (INDEPENDENT_AMBULATORY_CARE_PROVIDER_SITE_OTHER): Payer: Self-pay | Admitting: Orthopaedic Surgery

## 2017-11-25 ENCOUNTER — Ambulatory Visit (INDEPENDENT_AMBULATORY_CARE_PROVIDER_SITE_OTHER): Payer: Medicare Other

## 2017-11-25 ENCOUNTER — Ambulatory Visit (INDEPENDENT_AMBULATORY_CARE_PROVIDER_SITE_OTHER): Payer: Medicare Other | Admitting: Orthopaedic Surgery

## 2017-11-25 VITALS — BP 126/64 | HR 95 | Ht 67.0 in | Wt 170.0 lb

## 2017-11-25 DIAGNOSIS — S82892E Other fracture of left lower leg, subsequent encounter for open fracture type I or II with routine healing: Secondary | ICD-10-CM

## 2017-11-25 NOTE — Progress Notes (Signed)
Post-Op Visit Note   Patient: Jason Woods           Date of Birth: 16-Feb-1946           MRN: [id number] Visit Date: 11/25/2017 PCP: Glenda Chroman, MD   Assessment & Plan: Medial lateral incisions look good.  He can wash his leg with soap water and lotion to get the skin back to normal.  Cam boot will be used when he is up and he is able to weight-bear as tolerated.  In 2 weeks we will remove the syndesmotic screw and 2 view x-ray will be taken after Dr. Inda Merlin applies BB for localization.  Chief Complaint:  Chief Complaint  Patient presents with  . Left Ankle - Follow-up    10/05/17  I&D Open Ankle Fracture-Left, ORIF Left Trimalleolar Fracture   Visit Diagnoses:  1. Type I or II open fracture of left ankle with routine healing, subsequent encounter     Plan: Cam boot applied he can remove to work on ankle range of motion he can work on progressive weightbearing as tolerated.  Recheck 2 weeks and plan for syndesmotic screw removal with preoperative x-ray with BB.  Follow-Up Instructions: No follow-ups on file.   Orders:  Orders Placed This Encounter  Procedures  . XR Ankle Complete Left   No orders of the defined types were placed in this encounter.   Imaging: Xr Ankle Complete Left  Result Date: 11/25/2017 Three-view x-rays left ankle demonstrate interval healing of the trimalleolar ankle fracture.  Some lucency around the medial malleolar screws.  Syndesmotic screw is well-positioned and through 4 cortices.  Mortise is well reduced. Impression: Satisfactory ORIF trimalleolar ankle fracture,  Left.   PMFS History: Patient Active Problem List   Diagnosis Date Noted  . Open left ankle fracture 10/05/2017  . Open trimalleolar fracture of ankle, left, type I or II, initial encounter 10/05/2017  . PNA (pneumonia) 04/29/2017  . Esophageal dysphagia 03/31/2017  . Abnormal esophagram 03/31/2017  . Diabetes ([place]) 03/09/2017  . Vitamin B deficiency 03/09/2017  . High  cholesterol   . Hypertension   . GERD (gastroesophageal reflux disease)    Past Medical History:  Diagnosis Date  . Diabetes ([place])   . Diabetes ([place]) 03/09/2017  . GERD (gastroesophageal reflux disease)   . High cholesterol   . History of kidney stones   . Hypertension   . Legally blind   . Vitamin B deficiency 03/09/2017    Family History  Problem Relation Age of Onset  . Diabetes Mother   . Heart attack Father   . Diabetes Sister   . Diabetes Brother     Past Surgical History:  Procedure Laterality Date  . BIOPSY  04/23/2017   Procedure: BIOPSY;  Surgeon: Rogene Houston, MD;  [facility];  Service: Endoscopy;;  gastric  . ESOPHAGEAL DILATION N/A 04/23/2017   Procedure: ESOPHAGEAL DILATION;  Surgeon: Rogene Houston, MD;  [facility];  Service: Endoscopy;  Laterality: N/A;  . ESOPHAGOGASTRODUODENOSCOPY N/A 04/23/2017   Procedure: ESOPHAGOGASTRODUODENOSCOPY (EGD);  Surgeon: Rogene Houston, MD;  [facility];  Service: Endoscopy;  Laterality: N/A;  9:55-moved to 1040 per Ann  . I&D EXTREMITY Left 10/05/2017   Procedure: IRRIGATION AND DEBRIDEMENT QPEN ANKLE FRACTURE;  Surgeon: [name], MD;  Location: [place];  Service: Orthopedics;  Laterality: Left;  . left hand surgery     wrist fx about 5-6 months ago (2018)  .  ORIF ANKLE FRACTURE Left 10/05/2017   Procedure: OPEN REDUCTION INTERNAL FIXATION TRIMALLEOLAR (ORIF)ANKLE FRACTURE;  Surgeon: [name], MD;  Location: [place];  Service: Orthopedics;  Laterality: Left;   Social History   Occupational History  . Not on file  Tobacco Use  . Smoking status: Current Every Day Smoker    Packs/day: 0.50    Types: Cigarettes  . Smokeless tobacco: Never Used  . Tobacco comment: 1 pack a day x 60 yrs  Substance and Sexual Activity  . Alcohol use: No    Frequency: Never  . Drug use: No  . Sexual activity: Not on file

## 2017-11-29 DIAGNOSIS — K219 Gastro-esophageal reflux disease without esophagitis: Secondary | ICD-10-CM | POA: Diagnosis not present

## 2017-11-29 DIAGNOSIS — I10 Essential (primary) hypertension: Secondary | ICD-10-CM | POA: Diagnosis not present

## 2017-11-29 DIAGNOSIS — E11649 Type 2 diabetes mellitus with hypoglycemia without coma: Secondary | ICD-10-CM | POA: Diagnosis not present

## 2017-11-29 DIAGNOSIS — S93432D Sprain of tibiofibular ligament of left ankle, subsequent encounter: Secondary | ICD-10-CM | POA: Diagnosis not present

## 2017-11-29 DIAGNOSIS — H548 Legal blindness, as defined in USA: Secondary | ICD-10-CM | POA: Diagnosis not present

## 2017-11-29 DIAGNOSIS — S82852E Displaced trimalleolar fracture of left lower leg, subsequent encounter for open fracture type I or II with routine healing: Secondary | ICD-10-CM | POA: Diagnosis not present

## 2017-11-30 ENCOUNTER — Telehealth (INDEPENDENT_AMBULATORY_CARE_PROVIDER_SITE_OTHER): Payer: Self-pay | Admitting: Orthopaedic Surgery

## 2017-11-30 NOTE — Telephone Encounter (Signed)
Debbie, PT [facility] is requesting to extend orders for pt 1 week 2, she said this would carry over to the end of cert period and then she will call back to recertify and request more orders then. Also, she would like to verify weight bearing status for patient.   CB # [phone]

## 2017-11-30 NOTE — Telephone Encounter (Signed)
OK WBAT with boot thanks

## 2017-11-30 NOTE — Telephone Encounter (Signed)
I left voicemail for Jackelyn Poling advising.

## 2017-11-30 NOTE — Telephone Encounter (Signed)
Please advise on orders

## 2017-12-01 DIAGNOSIS — S93432D Sprain of tibiofibular ligament of left ankle, subsequent encounter: Secondary | ICD-10-CM | POA: Diagnosis not present

## 2017-12-01 DIAGNOSIS — K219 Gastro-esophageal reflux disease without esophagitis: Secondary | ICD-10-CM | POA: Diagnosis not present

## 2017-12-01 DIAGNOSIS — I10 Essential (primary) hypertension: Secondary | ICD-10-CM | POA: Diagnosis not present

## 2017-12-01 DIAGNOSIS — S82852E Displaced trimalleolar fracture of left lower leg, subsequent encounter for open fracture type I or II with routine healing: Secondary | ICD-10-CM | POA: Diagnosis not present

## 2017-12-01 DIAGNOSIS — E11649 Type 2 diabetes mellitus with hypoglycemia without coma: Secondary | ICD-10-CM | POA: Diagnosis not present

## 2017-12-01 DIAGNOSIS — H548 Legal blindness, as defined in USA: Secondary | ICD-10-CM | POA: Diagnosis not present

## 2017-12-03 DIAGNOSIS — I10 Essential (primary) hypertension: Secondary | ICD-10-CM | POA: Diagnosis not present

## 2017-12-03 DIAGNOSIS — J069 Acute upper respiratory infection, unspecified: Secondary | ICD-10-CM | POA: Diagnosis not present

## 2017-12-03 DIAGNOSIS — E1165 Type 2 diabetes mellitus with hyperglycemia: Secondary | ICD-10-CM | POA: Diagnosis not present

## 2017-12-03 DIAGNOSIS — Z6827 Body mass index (BMI) 27.0-27.9, adult: Secondary | ICD-10-CM | POA: Diagnosis not present

## 2017-12-03 DIAGNOSIS — Z299 Encounter for prophylactic measures, unspecified: Secondary | ICD-10-CM | POA: Diagnosis not present

## 2017-12-03 DIAGNOSIS — L03116 Cellulitis of left lower limb: Secondary | ICD-10-CM | POA: Diagnosis not present

## 2017-12-07 ENCOUNTER — Telehealth (INDEPENDENT_AMBULATORY_CARE_PROVIDER_SITE_OTHER): Payer: Self-pay | Admitting: Orthopaedic Surgery

## 2017-12-07 DIAGNOSIS — I10 Essential (primary) hypertension: Secondary | ICD-10-CM | POA: Diagnosis not present

## 2017-12-07 DIAGNOSIS — E11649 Type 2 diabetes mellitus with hypoglycemia without coma: Secondary | ICD-10-CM | POA: Diagnosis not present

## 2017-12-07 DIAGNOSIS — S93432D Sprain of tibiofibular ligament of left ankle, subsequent encounter: Secondary | ICD-10-CM | POA: Diagnosis not present

## 2017-12-07 DIAGNOSIS — K219 Gastro-esophageal reflux disease without esophagitis: Secondary | ICD-10-CM | POA: Diagnosis not present

## 2017-12-07 DIAGNOSIS — S82852E Displaced trimalleolar fracture of left lower leg, subsequent encounter for open fracture type I or II with routine healing: Secondary | ICD-10-CM | POA: Diagnosis not present

## 2017-12-07 DIAGNOSIS — H548 Legal blindness, as defined in USA: Secondary | ICD-10-CM | POA: Diagnosis not present

## 2017-12-07 NOTE — Telephone Encounter (Signed)
Verl Dicker from [facility] called left voicemail message needing verbal orders for 2 WK 6 starting next week. Patient think foot maybe infected due to redness. Debbie said the foot looked better today. She also said patient started an antibiotic last Friday (Cipro)500 mg twice a day for 7 days for upper respiratory issue. The number to contact Jackelyn Poling is [phone]

## 2017-12-08 NOTE — Telephone Encounter (Signed)
Ok [employer]

## 2017-12-08 NOTE — Telephone Encounter (Signed)
I left voicemail for Jason Woods advising.

## 2017-12-08 NOTE — Telephone Encounter (Signed)
Please advise. OK for orders? 

## 2017-12-09 ENCOUNTER — Encounter (INDEPENDENT_AMBULATORY_CARE_PROVIDER_SITE_OTHER): Payer: Self-pay | Admitting: Orthopaedic Surgery

## 2017-12-09 ENCOUNTER — Ambulatory Visit (INDEPENDENT_AMBULATORY_CARE_PROVIDER_SITE_OTHER): Payer: Medicare Other

## 2017-12-09 ENCOUNTER — Ambulatory Visit (INDEPENDENT_AMBULATORY_CARE_PROVIDER_SITE_OTHER): Payer: Medicare Other | Admitting: Orthopaedic Surgery

## 2017-12-09 VITALS — BP 122/77 | HR 100 | Ht 67.0 in

## 2017-12-09 DIAGNOSIS — Z79899 Other long term (current) drug therapy: Secondary | ICD-10-CM | POA: Diagnosis not present

## 2017-12-09 DIAGNOSIS — Z794 Long term (current) use of insulin: Secondary | ICD-10-CM | POA: Diagnosis not present

## 2017-12-09 DIAGNOSIS — S82892E Other fracture of left lower leg, subsequent encounter for open fracture type I or II with routine healing: Secondary | ICD-10-CM

## 2017-12-09 DIAGNOSIS — S93432D Sprain of tibiofibular ligament of left ankle, subsequent encounter: Secondary | ICD-10-CM | POA: Diagnosis not present

## 2017-12-09 DIAGNOSIS — S82852E Displaced trimalleolar fracture of left lower leg, subsequent encounter for open fracture type I or II with routine healing: Secondary | ICD-10-CM | POA: Diagnosis not present

## 2017-12-09 DIAGNOSIS — K219 Gastro-esophageal reflux disease without esophagitis: Secondary | ICD-10-CM | POA: Diagnosis not present

## 2017-12-09 DIAGNOSIS — E11649 Type 2 diabetes mellitus with hypoglycemia without coma: Secondary | ICD-10-CM | POA: Diagnosis not present

## 2017-12-09 DIAGNOSIS — Z9181 History of falling: Secondary | ICD-10-CM | POA: Diagnosis not present

## 2017-12-09 DIAGNOSIS — I10 Essential (primary) hypertension: Secondary | ICD-10-CM | POA: Diagnosis not present

## 2017-12-09 DIAGNOSIS — W19XXXD Unspecified fall, subsequent encounter: Secondary | ICD-10-CM | POA: Diagnosis not present

## 2017-12-09 DIAGNOSIS — F17210 Nicotine dependence, cigarettes, uncomplicated: Secondary | ICD-10-CM | POA: Diagnosis not present

## 2017-12-09 DIAGNOSIS — H548 Legal blindness, as defined in USA: Secondary | ICD-10-CM | POA: Diagnosis not present

## 2017-12-09 NOTE — Progress Notes (Signed)
Post-Op Visit Note   Patient: Jason Woods           Date of Birth: 06-19-1945           MRN: [id number] Visit Date: 12/09/2017 PCP: Glenda Chroman, MD   Assessment & Plan: Postop bimalleolar ankle fracture fixation with syndesmosis, left.  Skin looks good.  BB applied and localization x-rays obtained.  He has some slight mild edema at the ankles slight erythema due to dependent position.  He can elevate his foot a few times during the day to help with this.  Incisions are well-healed no evidence of cellulitis.  Chief Complaint:  Chief Complaint  Patient presents with  . Left Ankle - Follow-up    Syndesmotic screw removal 10/05/17 I&D Open Left Ankle Fracture, ORIF Left Trimalleolar Fracture   Visit Diagnoses:  1. Type I or II open fracture of left ankle with routine healing, subsequent encounter     Plan: Betadine prep sterile gloves sterile instrumentation lidocaine local, sterile 10 blade freer elevator and screwdriver was used for removal of the screw 4-0 nylon was applied for removal of deep retained syndesmotic screw left ankle.  He tolerated procedure well return 1 week for suture removal.  Follow-Up Instructions: Return in about 1 week (around 12/16/2017).   Orders:  Orders Placed This Encounter  Procedures  . XR Ankle 2 Views Left   No orders of the defined types were placed in this encounter.   Imaging: Xr Ankle 2 Views Left  Result Date: 12/09/2017 AP lateral left ankle obtained and reviewed.  This shows interval healing of the bimalleolar ankle fracture fixation of trimalleolar ankle fracture.  BB is placed just anterior to the syndesmotic screw. Impression: Interval healing trimalleolar ankle fracture with bimalleolar fixation localization BB noted.   PMFS History: Patient Active Problem List   Diagnosis Date Noted  . Open left ankle fracture 10/05/2017  . Open trimalleolar fracture of ankle, left, type I or II, initial encounter 10/05/2017  . PNA  (pneumonia) 04/29/2017  . Esophageal dysphagia 03/31/2017  . Abnormal esophagram 03/31/2017  . Diabetes ([place]) 03/09/2017  . Vitamin B deficiency 03/09/2017  . High cholesterol   . Hypertension   . GERD (gastroesophageal reflux disease)    Past Medical History:  Diagnosis Date  . Diabetes ([place])   . Diabetes ([place]) 03/09/2017  . GERD (gastroesophageal reflux disease)   . High cholesterol   . History of kidney stones   . Hypertension   . Legally blind   . Vitamin B deficiency 03/09/2017    Family History  Problem Relation Age of Onset  . Diabetes Mother   . Heart attack Father   . Diabetes Sister   . Diabetes Brother     Past Surgical History:  Procedure Laterality Date  . BIOPSY  04/23/2017   Procedure: BIOPSY;  Surgeon: Rogene Houston, MD;  [facility];  Service: Endoscopy;;  gastric  . ESOPHAGEAL DILATION N/A 04/23/2017   Procedure: ESOPHAGEAL DILATION;  Surgeon: Rogene Houston, MD;  [facility];  Service: Endoscopy;  Laterality: N/A;  . ESOPHAGOGASTRODUODENOSCOPY N/A 04/23/2017   Procedure: ESOPHAGOGASTRODUODENOSCOPY (EGD);  Surgeon: Rogene Houston, MD;  [facility];  Service: Endoscopy;  Laterality: N/A;  9:55-moved to 1040 per Ann  . I&D EXTREMITY Left 10/05/2017   Procedure: IRRIGATION AND DEBRIDEMENT QPEN ANKLE FRACTURE;  Surgeon: [name], MD;  Location: [facility];  Service: Orthopedics;  Laterality: Left;  . left hand  surgery     wrist fx about 5-6 months ago (2018)  . ORIF ANKLE FRACTURE Left 10/05/2017   Procedure: OPEN REDUCTION INTERNAL FIXATION TRIMALLEOLAR (ORIF)ANKLE FRACTURE;  Surgeon: [name], MD;  Location: [place];  Service: Orthopedics;  Laterality: Left;   Social History   Occupational History  . Not on file  Tobacco Use  . Smoking status: Current Every Day Smoker    Packs/day: 0.50    Types: Cigarettes  . Smokeless tobacco: Never Used  . Tobacco comment: 1 pack a day x 60 yrs  Substance and Sexual Activity    . Alcohol use: No    Frequency: Never  . Drug use: No  . Sexual activity: Not on file

## 2017-12-14 ENCOUNTER — Telehealth (INDEPENDENT_AMBULATORY_CARE_PROVIDER_SITE_OTHER): Payer: Self-pay | Admitting: Radiology

## 2017-12-14 DIAGNOSIS — I10 Essential (primary) hypertension: Secondary | ICD-10-CM | POA: Diagnosis not present

## 2017-12-14 DIAGNOSIS — H548 Legal blindness, as defined in USA: Secondary | ICD-10-CM | POA: Diagnosis not present

## 2017-12-14 DIAGNOSIS — E11649 Type 2 diabetes mellitus with hypoglycemia without coma: Secondary | ICD-10-CM | POA: Diagnosis not present

## 2017-12-14 DIAGNOSIS — K219 Gastro-esophageal reflux disease without esophagitis: Secondary | ICD-10-CM | POA: Diagnosis not present

## 2017-12-14 DIAGNOSIS — S93432D Sprain of tibiofibular ligament of left ankle, subsequent encounter: Secondary | ICD-10-CM | POA: Diagnosis not present

## 2017-12-14 DIAGNOSIS — S82852E Displaced trimalleolar fracture of left lower leg, subsequent encounter for open fracture type I or II with routine healing: Secondary | ICD-10-CM | POA: Diagnosis not present

## 2017-12-14 NOTE — Telephone Encounter (Signed)
I called discussed. No drainage. Swelling goes down with elevation. Not able to WB on foot yet. We will see him Thursday

## 2017-12-14 NOTE — Telephone Encounter (Signed)
noted 

## 2017-12-14 NOTE — Telephone Encounter (Signed)
Patient's daughter, Lynelle Smoke, left voicemail stating patient's ankle is swollen and painful since screw removal last Thursday.    I called and spoke with patient. He states that the ankle is so painful he cannot put weight on it. He does have some swelling and states that his daughter told him it felt warm to the touch yesterday but he cannot tell today. He denies fever or chills, just states it is painful. He is not taking anything for pain. Patient has follow up appointment in the [facility] office on Thursday, 9/26 for suture removal. Please advise if there is something patient can do until then or if you would like for him to try and get a ride and come in sooner.  CB [phone]

## 2017-12-16 ENCOUNTER — Encounter (INDEPENDENT_AMBULATORY_CARE_PROVIDER_SITE_OTHER): Payer: Self-pay | Admitting: Orthopaedic Surgery

## 2017-12-16 ENCOUNTER — Ambulatory Visit (INDEPENDENT_AMBULATORY_CARE_PROVIDER_SITE_OTHER): Payer: Medicare Other | Admitting: Orthopaedic Surgery

## 2017-12-16 VITALS — BP 129/69 | HR 109 | Temp 98.4°F | Ht 67.0 in | Wt 158.0 lb

## 2017-12-16 DIAGNOSIS — S82852B Displaced trimalleolar fracture of left lower leg, initial encounter for open fracture type I or II: Secondary | ICD-10-CM

## 2017-12-16 NOTE — Progress Notes (Signed)
Post-Op Visit Note   Patient: Jason Woods           Date of Birth: Feb 25, 1946           MRN: [id number] Visit Date: 12/16/2017 PCP: Glenda Chroman, MD   Assessment & Plan: Post bimalleolar ankle fracture fixation with syndesmotic screw removal.  Lateral sutures removed from harvesting of the syndesmotic screw.  He has some slight erythema no cellulitis.  Trace edema as expected he can stand on his foot put some weight on it.  He can use his boot at home get up and uses walker gradually increase the number steps he takes each day.  When he is able to go up and down the halls easily without pain putting a lot of weight on his foot and does not have any pain he can transition out of the boot go to a regular tennis shoe and then work from using the walker with a standard shoe to using a cane in his right hand and then as he gets stronger transition off again.  I plan to check him in 8 weeks.  Chief Complaint:  Chief Complaint  Patient presents with  . Left Ankle - Follow-up    Left ankle Syndesmotic screw removal in office 12/08/17   Visit Diagnoses:  1. Open trimalleolar fracture of ankle, left, type I or II, initial encounter     Plan: Progressive weightbearing.  Recheck 6 to 8 weeks.  Follow-Up Instructions: No follow-ups on file.   Orders:  No orders of the defined types were placed in this encounter.  No orders of the defined types were placed in this encounter.   Imaging: No results found.  PMFS History: Patient Active Problem List   Diagnosis Date Noted  . Open left ankle fracture 10/05/2017  . Open trimalleolar fracture of ankle, left, type I or II, initial encounter 10/05/2017  . PNA (pneumonia) 04/29/2017  . Esophageal dysphagia 03/31/2017  . Abnormal esophagram 03/31/2017  . Diabetes ([place]) 03/09/2017  . Vitamin B deficiency 03/09/2017  . High cholesterol   . Hypertension   . GERD (gastroesophageal reflux disease)    Past Medical History:  Diagnosis Date    . Diabetes ([place])   . Diabetes ([place]) 03/09/2017  . GERD (gastroesophageal reflux disease)   . High cholesterol   . History of kidney stones   . Hypertension   . Legally blind   . Vitamin B deficiency 03/09/2017    Family History  Problem Relation Age of Onset  . Diabetes Mother   . Heart attack Father   . Diabetes Sister   . Diabetes Brother     Past Surgical History:  Procedure Laterality Date  . BIOPSY  04/23/2017   Procedure: BIOPSY;  Surgeon: Rogene Houston, MD;  [facility];  Service: Endoscopy;;  gastric  . ESOPHAGEAL DILATION N/A 04/23/2017   Procedure: ESOPHAGEAL DILATION;  Surgeon: Rogene Houston, MD;  [facility];  Service: Endoscopy;  Laterality: N/A;  . ESOPHAGOGASTRODUODENOSCOPY N/A 04/23/2017   Procedure: ESOPHAGOGASTRODUODENOSCOPY (EGD);  Surgeon: Rogene Houston, MD;  [facility];  Service: Endoscopy;  Laterality: N/A;  9:55-moved to 1040 per Ann  . I&D EXTREMITY Left 10/05/2017   Procedure: IRRIGATION AND DEBRIDEMENT QPEN ANKLE FRACTURE;  Surgeon: [name], MD;  Location: [place];  Service: Orthopedics;  Laterality: Left;  . left hand surgery     wrist fx about 5-6 months ago (2018)  . ORIF ANKLE  FRACTURE Left 10/05/2017   Procedure: OPEN REDUCTION INTERNAL FIXATION TRIMALLEOLAR (ORIF)ANKLE FRACTURE;  Surgeon: [name], MD;  Location: [place];  Service: Orthopedics;  Laterality: Left;   Social History   Occupational History  . Not on file  Tobacco Use  . Smoking status: Current Every Day Smoker    Packs/day: 0.50    Types: Cigarettes  . Smokeless tobacco: Never Used  . Tobacco comment: 1 pack a day x 60 yrs  Substance and Sexual Activity  . Alcohol use: No    Frequency: Never  . Drug use: No  . Sexual activity: Not on file

## 2017-12-20 DIAGNOSIS — S93432D Sprain of tibiofibular ligament of left ankle, subsequent encounter: Secondary | ICD-10-CM | POA: Diagnosis not present

## 2017-12-20 DIAGNOSIS — I10 Essential (primary) hypertension: Secondary | ICD-10-CM | POA: Diagnosis not present

## 2017-12-20 DIAGNOSIS — E11649 Type 2 diabetes mellitus with hypoglycemia without coma: Secondary | ICD-10-CM | POA: Diagnosis not present

## 2017-12-20 DIAGNOSIS — S82852E Displaced trimalleolar fracture of left lower leg, subsequent encounter for open fracture type I or II with routine healing: Secondary | ICD-10-CM | POA: Diagnosis not present

## 2017-12-20 DIAGNOSIS — H548 Legal blindness, as defined in USA: Secondary | ICD-10-CM | POA: Diagnosis not present

## 2017-12-20 DIAGNOSIS — K219 Gastro-esophageal reflux disease without esophagitis: Secondary | ICD-10-CM | POA: Diagnosis not present

## 2017-12-22 ENCOUNTER — Telehealth (INDEPENDENT_AMBULATORY_CARE_PROVIDER_SITE_OTHER): Payer: Self-pay | Admitting: Orthopaedic Surgery

## 2017-12-22 DIAGNOSIS — S82852E Displaced trimalleolar fracture of left lower leg, subsequent encounter for open fracture type I or II with routine healing: Secondary | ICD-10-CM | POA: Diagnosis not present

## 2017-12-22 DIAGNOSIS — E11649 Type 2 diabetes mellitus with hypoglycemia without coma: Secondary | ICD-10-CM | POA: Diagnosis not present

## 2017-12-22 DIAGNOSIS — S93432D Sprain of tibiofibular ligament of left ankle, subsequent encounter: Secondary | ICD-10-CM | POA: Diagnosis not present

## 2017-12-22 DIAGNOSIS — H548 Legal blindness, as defined in USA: Secondary | ICD-10-CM | POA: Diagnosis not present

## 2017-12-22 DIAGNOSIS — I10 Essential (primary) hypertension: Secondary | ICD-10-CM | POA: Diagnosis not present

## 2017-12-22 DIAGNOSIS — K219 Gastro-esophageal reflux disease without esophagitis: Secondary | ICD-10-CM | POA: Diagnosis not present

## 2017-12-22 NOTE — Telephone Encounter (Signed)
Verl Dicker, PT at [facility] is wanting to let you all know that patients ankle infection looks good, it is still red but way better. She also said patient fell on Monday afternoon but no injuries. She does not need a call back but if needed her # [phone]

## 2017-12-22 NOTE — Telephone Encounter (Signed)
I called pt and discussed. No drainage. Therapist thought it looked good. Elevated foot more after he walks to decrease swelling . FYI

## 2017-12-22 NOTE — Telephone Encounter (Signed)
Jason Woods patient's daughter [facility] stating patient has a small spot where the sutures was removed that looked infected. Jason Woods will have home health take a look at the area, when they come out today. Jason Woods's call back # [phone]

## 2017-12-22 NOTE — Telephone Encounter (Signed)
FYI. There is another message in the patient's from his daughter today regarding an area she thought was infected.

## 2017-12-22 NOTE — Telephone Encounter (Signed)
Message in chart from Verl Dicker with [facility] regarding ankle and that it is looking better. That message was sent to Dr. Lorin Mercy for review.

## 2017-12-24 DIAGNOSIS — I10 Essential (primary) hypertension: Secondary | ICD-10-CM | POA: Diagnosis not present

## 2017-12-24 DIAGNOSIS — E119 Type 2 diabetes mellitus without complications: Secondary | ICD-10-CM | POA: Diagnosis not present

## 2017-12-24 DIAGNOSIS — E7800 Pure hypercholesterolemia, unspecified: Secondary | ICD-10-CM | POA: Diagnosis not present

## 2017-12-28 DIAGNOSIS — E11649 Type 2 diabetes mellitus with hypoglycemia without coma: Secondary | ICD-10-CM | POA: Diagnosis not present

## 2017-12-28 DIAGNOSIS — S93432D Sprain of tibiofibular ligament of left ankle, subsequent encounter: Secondary | ICD-10-CM | POA: Diagnosis not present

## 2017-12-28 DIAGNOSIS — M545 Low back pain: Secondary | ICD-10-CM | POA: Diagnosis not present

## 2017-12-28 DIAGNOSIS — M25532 Pain in left wrist: Secondary | ICD-10-CM | POA: Diagnosis not present

## 2017-12-28 DIAGNOSIS — H548 Legal blindness, as defined in USA: Secondary | ICD-10-CM | POA: Diagnosis not present

## 2017-12-28 DIAGNOSIS — I10 Essential (primary) hypertension: Secondary | ICD-10-CM | POA: Diagnosis not present

## 2017-12-28 DIAGNOSIS — S82852E Displaced trimalleolar fracture of left lower leg, subsequent encounter for open fracture type I or II with routine healing: Secondary | ICD-10-CM | POA: Diagnosis not present

## 2017-12-28 DIAGNOSIS — K219 Gastro-esophageal reflux disease without esophagitis: Secondary | ICD-10-CM | POA: Diagnosis not present

## 2017-12-28 DIAGNOSIS — M25559 Pain in unspecified hip: Secondary | ICD-10-CM | POA: Diagnosis not present

## 2017-12-28 DIAGNOSIS — M5010 Cervical disc disorder with radiculopathy, unspecified cervical region: Secondary | ICD-10-CM | POA: Diagnosis not present

## 2017-12-31 ENCOUNTER — Telehealth (INDEPENDENT_AMBULATORY_CARE_PROVIDER_SITE_OTHER): Payer: Self-pay | Admitting: Orthopaedic Surgery

## 2017-12-31 DIAGNOSIS — H548 Legal blindness, as defined in USA: Secondary | ICD-10-CM | POA: Diagnosis not present

## 2017-12-31 DIAGNOSIS — S93432D Sprain of tibiofibular ligament of left ankle, subsequent encounter: Secondary | ICD-10-CM | POA: Diagnosis not present

## 2017-12-31 DIAGNOSIS — K219 Gastro-esophageal reflux disease without esophagitis: Secondary | ICD-10-CM | POA: Diagnosis not present

## 2017-12-31 DIAGNOSIS — S82852E Displaced trimalleolar fracture of left lower leg, subsequent encounter for open fracture type I or II with routine healing: Secondary | ICD-10-CM | POA: Diagnosis not present

## 2017-12-31 DIAGNOSIS — I10 Essential (primary) hypertension: Secondary | ICD-10-CM | POA: Diagnosis not present

## 2017-12-31 DIAGNOSIS — E11649 Type 2 diabetes mellitus with hypoglycemia without coma: Secondary | ICD-10-CM | POA: Diagnosis not present

## 2017-12-31 NOTE — Telephone Encounter (Signed)
[place] for continued therapy, read her my last OV opening paragraph under Plan. thanks

## 2017-12-31 NOTE — Telephone Encounter (Signed)
Please advise 

## 2017-12-31 NOTE — Telephone Encounter (Signed)
LMOM giving her verbal and also read her the plan

## 2017-12-31 NOTE — Telephone Encounter (Signed)
Debbie, PT at [facility] is requesting physical therapy verbal orders  2 x for 5 weeks  She said he has 5 weeks left in certification period and she believes he could benefit from twice a week. She also needed some insight as to how much longer he will be in boot.   CB # [phone]

## 2018-01-03 DIAGNOSIS — M25472 Effusion, left ankle: Secondary | ICD-10-CM | POA: Diagnosis not present

## 2018-01-03 DIAGNOSIS — Z23 Encounter for immunization: Secondary | ICD-10-CM | POA: Diagnosis not present

## 2018-01-03 DIAGNOSIS — Z299 Encounter for prophylactic measures, unspecified: Secondary | ICD-10-CM | POA: Diagnosis not present

## 2018-01-03 DIAGNOSIS — Z6825 Body mass index (BMI) 25.0-25.9, adult: Secondary | ICD-10-CM | POA: Diagnosis not present

## 2018-01-03 DIAGNOSIS — E1165 Type 2 diabetes mellitus with hyperglycemia: Secondary | ICD-10-CM | POA: Diagnosis not present

## 2018-01-03 DIAGNOSIS — I10 Essential (primary) hypertension: Secondary | ICD-10-CM | POA: Diagnosis not present

## 2018-01-03 DIAGNOSIS — E1142 Type 2 diabetes mellitus with diabetic polyneuropathy: Secondary | ICD-10-CM | POA: Diagnosis not present

## 2018-01-04 ENCOUNTER — Telehealth (INDEPENDENT_AMBULATORY_CARE_PROVIDER_SITE_OTHER): Payer: Self-pay | Admitting: Orthopaedic Surgery

## 2018-01-04 NOTE — Telephone Encounter (Signed)
fyi

## 2018-01-04 NOTE — Telephone Encounter (Signed)
[place]  [phone]   Debbie called from [facility] wanted to inform Dr.Yates that patient refused visit today due to infection. Patient is currently seeing his PCP for infection.

## 2018-01-05 NOTE — Telephone Encounter (Signed)
noted 

## 2018-01-05 NOTE — Telephone Encounter (Signed)
I called and discussed.  Ends of infection problem was bronchitis.  No drainage from his leg.  He has an appointment on Thursday.  FYI

## 2018-01-06 ENCOUNTER — Encounter (HOSPITAL_COMMUNITY): Payer: Self-pay | Admitting: *Deleted

## 2018-01-06 ENCOUNTER — Other Ambulatory Visit: Payer: Self-pay

## 2018-01-06 ENCOUNTER — Encounter (INDEPENDENT_AMBULATORY_CARE_PROVIDER_SITE_OTHER): Payer: Self-pay | Admitting: Orthopaedic Surgery

## 2018-01-06 ENCOUNTER — Other Ambulatory Visit (INDEPENDENT_AMBULATORY_CARE_PROVIDER_SITE_OTHER): Payer: Self-pay | Admitting: Orthopaedic Surgery

## 2018-01-06 ENCOUNTER — Ambulatory Visit (INDEPENDENT_AMBULATORY_CARE_PROVIDER_SITE_OTHER): Payer: Medicare Other | Admitting: Orthopaedic Surgery

## 2018-01-06 ENCOUNTER — Ambulatory Visit (INDEPENDENT_AMBULATORY_CARE_PROVIDER_SITE_OTHER): Payer: Medicare Other

## 2018-01-06 VITALS — BP 131/81 | HR 104 | Ht 67.0 in | Wt 158.0 lb

## 2018-01-06 DIAGNOSIS — S82842K Displaced bimalleolar fracture of left lower leg, subsequent encounter for closed fracture with nonunion: Secondary | ICD-10-CM

## 2018-01-06 DIAGNOSIS — S82852B Displaced trimalleolar fracture of left lower leg, initial encounter for open fracture type I or II: Secondary | ICD-10-CM | POA: Diagnosis not present

## 2018-01-06 NOTE — Progress Notes (Addendum)
Pt denies SOB, chest pain, and being under the care of a cardiologist. Pt denies having a stress test, echo and cardiac cath. Pt made aware to stop taking vitamins, fish oil, Subutex and herbal medications. Do not take any NSAIDs ie: Ibuprofen, Advil, Naproxen (Aleve), Motrin, BC and Goody Powder. Pt stated that he is blind and made pharmacy tech aware that he will bring his medication list in on DOS. Pt stated that he was instructed by the surgeon's nurse to not take any medications the morning of surgery when PAT nurse attempted to provide pre-op instructions. Nurse attempted to provide instructions regarding insulin and pt stated " I already took 14 units of insulin tonight, I am not taking anything tomorrow like they said."  Nurse educated pt on the importance of complying with pre-op instructions regarding diabetes medications to avoid potential problems the DOS.  Pt made aware to check BG every 2 hours prior to arrival to hospital on DOS. Pt made aware to treat a BG < 70 with 4 glucose tabs,  wait 15 minutes after intervention to recheck BG, if BG remains < 70, call Short Stay unit to speak with a nurse.  Pt verbalized understanding of all pre-op instructions. Spoke with pt daughter, Jason Woods, to make her aware to have pt take 1/2 of Lantus insulin sliding scale on DOS if pt BG > 70. Requested that daughter bring in pt medications on DOS because pt was having difficulty getting medication list from pharmacy. Tammy verbalized understanding of all pre-op instructions.

## 2018-01-06 NOTE — Anesthesia Preprocedure Evaluation (Addendum)
Anesthesia Evaluation  Patient identified by MRN, date of birth, ID band Patient awake    Reviewed: Allergy & Precautions, NPO status , Patient's Chart, lab work & pertinent test results  History of Anesthesia Complications Negative for: history of anesthetic complications  Airway Mallampati: II  TM Distance: >3 FB Neck ROM: Full    Dental  (+) Poor Dentition, Dental Advisory Given, Missing   Pulmonary pneumonia (Feb 2019), resolved, Current Smoker,    Pulmonary exam normal breath sounds clear to auscultation       Cardiovascular hypertension, Pt. on medications Normal cardiovascular exam Rhythm:Regular Rate:Normal     Neuro/Psych Depression negative neurological ROS     GI/Hepatic Neg liver ROS, GERD  Medicated and Controlled,  Endo/Other  diabetes, Type 2, Insulin Dependent, Oral Hypoglycemic Agents  Renal/GU negative Renal ROS     Musculoskeletal negative musculoskeletal ROS (+)   Abdominal   Peds  Hematology negative hematology ROS (+)   Anesthesia Other Findings Day of surgery medications reviewed with the patient.  Reproductive/Obstetrics                            Anesthesia Physical Anesthesia Plan  ASA: III  Anesthesia Plan: General   Post-op Pain Management: GA combined w/ Regional for post-op pain   Induction: Intravenous  PONV Risk Score and Plan: 1 and Ondansetron and Treatment may vary due to age or medical condition  Airway Management Planned: LMA  Additional Equipment:   Intra-op Plan:   Post-operative Plan: Extubation in OR  Informed Consent: I have reviewed the patients History and Physical, chart, labs and discussed the procedure including the risks, benefits and alternatives for the proposed anesthesia with the patient or authorized representative who has indicated his/her understanding and acceptance.   Dental advisory given  Plan Discussed with: CRNA  and Surgeon  Anesthesia Plan Comments:        Anesthesia Quick Evaluation

## 2018-01-06 NOTE — Progress Notes (Signed)
Office Visit Note   Patient: Jason Woods           Date of Birth: Apr 14, 1945           MRN: [id number] Visit Date: 01/06/2018              Requested by: Glenda Chroman, MD [place], [place] [address] PCP: Glenda Chroman, MD   Assessment & Plan: Visit Diagnoses:  1. Open trimalleolar fracture of ankle, left, type I or II, initial encounter     Plan: Patient had syndesmotic screw removal on 12/09/2017 and is been trying to ambulate.  He is developed progressive varus displaced on the ankle and increased pain and x-rays today demonstrates loss of fixation due to plate and displacement of medial malleolus with subluxation of the trimalleolar ankle fracture.  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  Patient about problems in case there is infection in the bone he understands he might ultimately end up with an amputation if the infection cannot be eradicated.  We discussed recommendations for potential ankle fusion if there is no evidence of infection since his fracture is not healed and ankle is subluxed.  Patient's daughter is here questions were elicited and answered they agree to proceed with removal realignment ankle and external fixator.  We discussed that ultimately he could end up with an amputation we discussed smoking sensation.  Have diabetes with peripheral arterial disease which increases his risk.  Plan will be removal of hardware realignment of his ankle and application of external fixator and antibiotics appropriate based on cultures.  If it appears there is infection we may apply antibiotic beads and he may need bone debridement.  Follow-Up Instructions: surgery tomorrow.   Orders:  Orders Placed This Encounter  Procedures  . XR Ankle Complete Left   No orders of the defined types were placed in this encounter.     Procedures: No procedures performed   Clinical Data: No additional  findings.   Subjective: Chief Complaint  Patient presents with  . Left Ankle - Follow-up    10/05/17 I&D Left Ankle Open Fracture, ORIF Left Trimalleolar Ankle Fracture    HPI 72 year old male returns with increased discomfort in his ankle no drainage but is developed some bursa distal ankle fracture.  Regional surgery was 10/05/2017 with washout of open left ankle trimalleolar ankle fracture and bimalleolar fixation with lateral locking plate and medial fixation with syndesmotic screw.  Has syndesmotic screw removal and since that time is had displacement of the ankle with increased discomfort and swelling.  He recently started some Cipro antibiotics for a chest infection by his PCP.  X-rays obtained today shows displacement and loss of fixation.  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  Review of Systems patient is a diabetic he is also a smoker has hypertension calcification of his posterior tibial artery with PAD.  Legally blind vitamin D deficiency history of kidney stones high cholesterol.  [name] currently on some antibiotics for bronchitis.   Objective: Vital Signs: BP 131/81   Pulse (!) 104   Ht 5\' 7"  (1.702 m)   Wt 158 lb (71.7 kg)   BMI 24.75 kg/m   Physical Exam  Constitutional: He is oriented to person, place, and time. He appears well-developed and well-nourished.  HENT:  Head: Normocephalic and atraumatic.  Eyes: Pupils are equal, round, and reactive to light. EOM are normal.  Neck: No  tracheal deviation present. No thyromegaly present.  Cardiovascular: Normal rate.  Pulmonary/Chest: Effort normal. He has no wheezes.  Abdominal: Soft. Bowel sounds are normal.  Neurological: He is alert and oriented to person, place, and time.  Skin: Skin is warm and dry. Capillary refill takes less than 2 seconds.  Psychiatric: He has a normal mood and affect. His behavior is normal. Judgment and thought content normal.    Ortho Exam patient has 10 to 15 degrees  ankle dorsiflexion plantarflexion with varus deformity the ankle.  Medial lateral incisions are well-healed.  Specialty Comments:  No specialty comments available.  Imaging: Xr Ankle Complete Left  Result Date: 01/06/2018 Three-view x-rays left ankle obtained and reviewed.  This shows nonunion with distal screw pullout from the fibula and displacement of the medial malleolus with ankle subluxation.  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    PMFS History: Patient Active Problem List   Diagnosis Date Noted  . Open left ankle fracture 10/05/2017  . Open trimalleolar fracture of ankle, left, type I or II, initial encounter 10/05/2017  . PNA (pneumonia) 04/29/2017  . Esophageal dysphagia 03/31/2017  . Abnormal esophagram 03/31/2017  . Diabetes ([place]) 03/09/2017  . Vitamin B deficiency 03/09/2017  . High cholesterol   . Hypertension   . GERD (gastroesophageal reflux disease)    Past Medical History:  Diagnosis Date  . Diabetes ([place])   . Diabetes ([place]) 03/09/2017  . GERD (gastroesophageal reflux disease)   . High cholesterol   . History of kidney stones   . Hypertension   . Legally blind   . Vitamin B deficiency 03/09/2017    Family History  Problem Relation Age of Onset  . Diabetes Mother   . Heart attack Father   . Diabetes Sister   . Diabetes Brother     Past Surgical History:  Procedure Laterality Date  . BIOPSY  04/23/2017   Procedure: BIOPSY;  Surgeon: Rogene Houston, MD;  [facility];  Service: Endoscopy;;  gastric  . ESOPHAGEAL DILATION N/A 04/23/2017   Procedure: ESOPHAGEAL DILATION;  Surgeon: Rogene Houston, MD;  [facility];  Service: Endoscopy;  Laterality: N/A;  . ESOPHAGOGASTRODUODENOSCOPY N/A 04/23/2017   Procedure: ESOPHAGOGASTRODUODENOSCOPY (EGD);  Surgeon: Rogene Houston, MD;   [facility];  Service: Endoscopy;  Laterality: N/A;  9:55-moved to 1040 per Ann  . I&D EXTREMITY Left 10/05/2017   Procedure: IRRIGATION AND DEBRIDEMENT QPEN ANKLE FRACTURE;  Surgeon: [name], MD;  Location: [place];  Service: Orthopedics;  Laterality: Left;  . left hand surgery     wrist fx about 5-6 months ago (2018)  . ORIF ANKLE FRACTURE Left 10/05/2017   Procedure: OPEN REDUCTION INTERNAL FIXATION TRIMALLEOLAR (ORIF)ANKLE FRACTURE;  Surgeon: [name], MD;  Location: [place];  Service: Orthopedics;  Laterality: Left;   Social History   Occupational History  . Not on file  Tobacco Use  . Smoking status: Current Every Day Smoker    Packs/day: 0.50    Types: Cigarettes  . Smokeless tobacco: Never Used  . Tobacco comment: 1 pack a day x 60 yrs  Substance and Sexual Activity  . Alcohol use: No    Frequency: Never  . Drug use: No  . Sexual activity: Not on file

## 2018-01-06 NOTE — H&P (Signed)
Patient: Jason Woods                                        Date of Birth: 1945/06/04                                                  MRN: [id number] Visit Date: 01/06/2018                                                                     Requested by: Jason Chroman, MD Jason Woods, [place] [address] PCP: Jason Chroman, MD   Assessment & Plan: Visit Diagnoses:  1. Open trimalleolar fracture of ankle, left, type I or II, initial encounter     Plan: Patient had syndesmotic screw removal on 12/09/2017 and is been trying to ambulate.  He is developed progressive varus displaced on the ankle and increased pain and x-rays today demonstrates loss of fixation due to plate and displacement of medial malleolus with subluxation of the trimalleolar ankle fracture.  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  Patient about problems in case there is infection in the bone he understands he might ultimately end up with an amputation if the infection cannot be eradicated.  We discussed recommendations for potential ankle fusion if there is no evidence of infection since his fracture is not healed and ankle is subluxed.  Patient's daughter is here questions were elicited and answered they agree to proceed with removal realignment ankle and external fixator.  We discussed that ultimately he could end up with an amputation we discussed smoking sensation.  Have diabetes with peripheral arterial disease which increases his risk.  Plan will be removal of hardware realignment of his ankle and application of external fixator and antibiotics appropriate based on cultures.  If it appears there is infection we may apply antibiotic beads and he may need bone debridement.  Follow-Up Instructions: surgery tomorrow.   Orders:     Orders Placed This Encounter  Procedures  . XR Ankle Complete Left   No orders of the defined types were  placed in this encounter.     Procedures: No procedures performed   Clinical Data: No additional findings.   Subjective:     Chief Complaint  Patient presents with  . Left Ankle - Follow-up    10/05/17 I&D Left Ankle Open Fracture, ORIF Left Trimalleolar Ankle Fracture    HPI 72 year old male returns with increased discomfort in his ankle no drainage but is developed some bursa distal ankle fracture.  Regional surgery was 10/05/2017 with washout of open left ankle trimalleolar ankle fracture and bimalleolar fixation with lateral locking plate and medial fixation with syndesmotic screw.  Has syndesmotic screw removal and since that time is had displacement of the ankle with increased discomfort and swelling.  He recently started some Cipro antibiotics for a chest infection by his PCP.  X-rays obtained today shows displacement and loss of fixation.  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  Review of Systems patient is a diabetic he is also a smoker has hypertension calcification of his posterior tibial artery with PAD.  Legally blind vitamin D deficiency history of kidney stones high cholesterol.  Jason Woods pneumonia currently on some antibiotics for bronchitis.   Objective: Vital Signs: BP 131/81   Pulse (!) 104   Ht 5\' 7"  (1.702 m)   Wt 158 lb (71.7 kg)   BMI 24.75 kg/m   Physical Exam  Constitutional: He is oriented to person, place, and time. He appears well-developed and well-nourished.  HENT:  Head: Normocephalic and atraumatic.  Eyes: Pupils are equal, round, and reactive to light. EOM are normal.  Neck: No tracheal deviation present. No thyromegaly present.  Cardiovascular: Normal rate.  Pulmonary/Chest: Effort normal. He has no wheezes.  Abdominal: Soft. Bowel sounds are normal.  Neurological: He is alert and oriented to person, place, and time.  Skin: Skin is warm and dry. Capillary refill takes less than 2 seconds.  Psychiatric: He has a normal  mood and affect. His behavior is normal. Judgment and thought content normal.    Ortho Exam patient has 10 to 15 degrees ankle dorsiflexion plantarflexion with varus deformity the ankle.  Medial lateral incisions are well-healed.  Specialty Comments:  No specialty comments available.  Imaging: Xr Ankle Complete Left  Result Date: 01/06/2018 Three-view x-rays left ankle obtained and reviewed.  This shows nonunion with distal screw pullout from the fibula and displacement of the medial malleolus with ankle subluxation.  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    PMFS History:     Patient Active Problem List   Diagnosis Date Noted  . Open left ankle fracture 10/05/2017  . Open trimalleolar fracture of ankle, left, type I or II, initial encounter 10/05/2017  . PNA (pneumonia) 04/29/2017  . Esophageal dysphagia 03/31/2017  . Abnormal esophagram 03/31/2017  . Diabetes ([place]) 03/09/2017  . Vitamin B deficiency 03/09/2017  . High cholesterol   . Hypertension   . GERD (gastroesophageal reflux disease)        Past Medical History:  Diagnosis Date  . Diabetes ([place])   . Diabetes ([place]) 03/09/2017  . GERD (gastroesophageal reflux disease)   . High cholesterol   . History of kidney stones   . Hypertension   . Legally blind   . Vitamin B deficiency 03/09/2017    Family History  Problem Relation Age of Onset  . Diabetes Mother   . Heart attack Father   . Diabetes Sister   . Diabetes Brother          Past Surgical History:  Procedure Laterality Date  . BIOPSY  04/23/2017   Procedure: BIOPSY;  Surgeon: Jason Houston, MD;  [facility];  Service: Endoscopy;;  gastric  . ESOPHAGEAL DILATION N/A 04/23/2017   Procedure: ESOPHAGEAL DILATION;  Surgeon: Jason Houston, MD;  [facility];   Service: Endoscopy;  Laterality: N/A;  . ESOPHAGOGASTRODUODENOSCOPY N/A 04/23/2017   Procedure: ESOPHAGOGASTRODUODENOSCOPY (EGD);  Surgeon: Jason Houston, MD;  [facility];  Service: Endoscopy;  Laterality: N/A;  9:55-moved to 1040 per Jason Woods  . I&D EXTREMITY Left 10/05/2017   Procedure: IRRIGATION AND DEBRIDEMENT QPEN ANKLE FRACTURE;  Surgeon: [name], MD;  Location: [facility];  Service: Orthopedics;  Laterality: Left;  . left hand surgery  wrist fx about 5-6 months ago (2018)  . ORIF ANKLE FRACTURE Left 10/05/2017   Procedure: OPEN REDUCTION INTERNAL FIXATION TRIMALLEOLAR (ORIF)ANKLE FRACTURE;  Surgeon: [name], MD;  Location: [facility];  Service: Orthopedics;  Laterality: Left;   Social History        Occupational History  . Not on file  Tobacco Use  . Smoking status: Current Every Day Smoker    Packs/day: 0.50    Types: Cigarettes  . Smokeless tobacco: Never Used  . Tobacco comment: 1 pack a day x 60 yrs  Substance and Sexual Activity  . Alcohol use: No    Frequency: Never  . Drug use: No  . Sexual activity: Not on file              Electronically signed by [name], MD at 01/06/2018 10:04 AM

## 2018-01-07 ENCOUNTER — Encounter (HOSPITAL_COMMUNITY): Payer: Self-pay | Admitting: *Deleted

## 2018-01-07 ENCOUNTER — Other Ambulatory Visit: Payer: Self-pay

## 2018-01-07 ENCOUNTER — Inpatient Hospital Stay (HOSPITAL_COMMUNITY): Payer: Medicare Other

## 2018-01-07 ENCOUNTER — Inpatient Hospital Stay (HOSPITAL_COMMUNITY): Payer: Medicare Other | Admitting: Anesthesiology

## 2018-01-07 ENCOUNTER — Encounter (HOSPITAL_COMMUNITY)
Admission: RE | Disposition: A | Discharge status: Home Health | Payer: Self-pay | Source: Home / Self Care | Attending: Orthopaedic Surgery

## 2018-01-07 ENCOUNTER — Inpatient Hospital Stay (HOSPITAL_COMMUNITY)
Admission: RE | Admit: 2018-01-07 | Discharge: 2018-01-14 | DRG: 494 | Disposition: A | Discharge status: Home Health | Payer: Medicare Other | Attending: Orthopaedic Surgery | Admitting: Orthopaedic Surgery

## 2018-01-07 DIAGNOSIS — Z833 Family history of diabetes mellitus: Secondary | ICD-10-CM

## 2018-01-07 DIAGNOSIS — J40 Bronchitis, not specified as acute or chronic: Secondary | ICD-10-CM | POA: Diagnosis present

## 2018-01-07 DIAGNOSIS — S82892A Other fracture of left lower leg, initial encounter for closed fracture: Secondary | ICD-10-CM | POA: Diagnosis present

## 2018-01-07 DIAGNOSIS — S82852K Displaced trimalleolar fracture of left lower leg, subsequent encounter for closed fracture with nonunion: Secondary | ICD-10-CM | POA: Diagnosis not present

## 2018-01-07 DIAGNOSIS — E1151 Type 2 diabetes mellitus with diabetic peripheral angiopathy without gangrene: Secondary | ICD-10-CM | POA: Diagnosis not present

## 2018-01-07 DIAGNOSIS — S82852M Displaced trimalleolar fracture of left lower leg, subsequent encounter for open fracture type I or II with nonunion: Secondary | ICD-10-CM | POA: Diagnosis not present

## 2018-01-07 DIAGNOSIS — Z4789 Encounter for other orthopedic aftercare: Secondary | ICD-10-CM | POA: Diagnosis not present

## 2018-01-07 DIAGNOSIS — X58XXXD Exposure to other specified factors, subsequent encounter: Secondary | ICD-10-CM | POA: Diagnosis present

## 2018-01-07 DIAGNOSIS — G8918 Other acute postprocedural pain: Secondary | ICD-10-CM | POA: Diagnosis not present

## 2018-01-07 DIAGNOSIS — E559 Vitamin D deficiency, unspecified: Secondary | ICD-10-CM | POA: Diagnosis present

## 2018-01-07 DIAGNOSIS — F419 Anxiety disorder, unspecified: Secondary | ICD-10-CM | POA: Diagnosis not present

## 2018-01-07 DIAGNOSIS — S82842K Displaced bimalleolar fracture of left lower leg, subsequent encounter for closed fracture with nonunion: Secondary | ICD-10-CM | POA: Diagnosis not present

## 2018-01-07 DIAGNOSIS — I10 Essential (primary) hypertension: Secondary | ICD-10-CM | POA: Diagnosis not present

## 2018-01-07 DIAGNOSIS — E119 Type 2 diabetes mellitus without complications: Secondary | ICD-10-CM | POA: Diagnosis not present

## 2018-01-07 DIAGNOSIS — K219 Gastro-esophageal reflux disease without esophagitis: Secondary | ICD-10-CM | POA: Diagnosis present

## 2018-01-07 DIAGNOSIS — S9302XD Subluxation of left ankle joint, subsequent encounter: Secondary | ICD-10-CM | POA: Diagnosis not present

## 2018-01-07 DIAGNOSIS — Z85828 Personal history of other malignant neoplasm of skin: Secondary | ICD-10-CM | POA: Diagnosis not present

## 2018-01-07 DIAGNOSIS — F17210 Nicotine dependence, cigarettes, uncomplicated: Secondary | ICD-10-CM | POA: Diagnosis present

## 2018-01-07 DIAGNOSIS — H548 Legal blindness, as defined in USA: Secondary | ICD-10-CM | POA: Diagnosis present

## 2018-01-07 DIAGNOSIS — Z419 Encounter for procedure for purposes other than remedying health state, unspecified: Secondary | ICD-10-CM

## 2018-01-07 DIAGNOSIS — Z8249 Family history of ischemic heart disease and other diseases of the circulatory system: Secondary | ICD-10-CM

## 2018-01-07 DIAGNOSIS — Z87442 Personal history of urinary calculi: Secondary | ICD-10-CM | POA: Diagnosis not present

## 2018-01-07 DIAGNOSIS — E7800 Pure hypercholesterolemia, unspecified: Secondary | ICD-10-CM | POA: Diagnosis present

## 2018-01-07 HISTORY — DX: Type 2 diabetes mellitus without complications: E11.9

## 2018-01-07 HISTORY — DX: Other chronic pain: G89.29

## 2018-01-07 HISTORY — PX: ANKLE HARDWARE REMOVAL: SHX1149

## 2018-01-07 HISTORY — PX: EXTERNAL FIXATION LEG: SHX1549

## 2018-01-07 HISTORY — DX: Other fracture of left lower leg, subsequent encounter for closed fracture with nonunion: S82.892K

## 2018-01-07 HISTORY — DX: Depression, unspecified: F32.A

## 2018-01-07 HISTORY — DX: Major depressive disorder, single episode, unspecified: F32.9

## 2018-01-07 HISTORY — DX: Pneumonia, unspecified organism: J18.9

## 2018-01-07 HISTORY — DX: Unspecified malignant neoplasm of skin, unspecified: C44.90

## 2018-01-07 HISTORY — DX: Dorsalgia, unspecified: M54.9

## 2018-01-07 HISTORY — PX: HARDWARE REMOVAL: SHX979

## 2018-01-07 HISTORY — DX: Unspecified osteoarthritis, unspecified site: M19.90

## 2018-01-07 HISTORY — DX: Anxiety disorder, unspecified: F41.9

## 2018-01-07 LAB — GLUCOSE, CAPILLARY
GLUCOSE-CAPILLARY: 133 mg/dL — AB (ref 70–99)
Glucose-Capillary: 151 mg/dL — ABNORMAL HIGH (ref 70–99)
Glucose-Capillary: 463 mg/dL — ABNORMAL HIGH (ref 70–99)
Glucose-Capillary: 243 mg/dL — ABNORMAL HIGH (ref 70–99)

## 2018-01-07 LAB — BASIC METABOLIC PANEL
ANION GAP: 15 (ref 5–15)
BUN: 7 mg/dL — ABNORMAL LOW (ref 8–23)
CALCIUM: 9.3 mg/dL (ref 8.9–10.3)
CO2: 27 mmol/L (ref 22–32)
Chloride: 89 mmol/L — ABNORMAL LOW (ref 98–111)
Creatinine, Ser: 0.69 mg/dL (ref 0.61–1.24)
Glucose, Bld: 116 mg/dL — ABNORMAL HIGH (ref 70–99)
POTASSIUM: 3.7 mmol/L (ref 3.5–5.1)
Sodium: 131 mmol/L — ABNORMAL LOW (ref 135–145)

## 2018-01-07 LAB — CBC WITH DIFFERENTIAL/PLATELET
ABS IMMATURE GRANULOCYTES: 0.05 10*3/uL (ref 0.00–0.07)
BASOS ABS: 0.1 10*3/uL (ref 0.0–0.1)
BASOS PCT: 1 %
Eosinophils Absolute: 0.2 10*3/uL (ref 0.0–0.5)
Eosinophils Relative: 2 %
HEMATOCRIT: 39 % (ref 39.0–52.0)
Hemoglobin: 12 g/dL — ABNORMAL LOW (ref 13.0–17.0)
IMMATURE GRANULOCYTES: 1 %
LYMPHS ABS: 1.5 10*3/uL (ref 0.7–4.0)
Lymphocytes Relative: 14 %
MCH: 25.2 pg — ABNORMAL LOW (ref 26.0–34.0)
MCHC: 30.8 g/dL (ref 30.0–36.0)
MCV: 81.9 fL (ref 80.0–100.0)
MONOS PCT: 7 %
Monocytes Absolute: 0.8 10*3/uL (ref 0.1–1.0)
NEUTROS ABS: 8.4 10*3/uL — AB (ref 1.7–7.7)
NEUTROS PCT: 75 %
NRBC: 0 % (ref 0.0–0.2)
PLATELETS: 455 10*3/uL — AB (ref 150–400)
RBC: 4.76 MIL/uL (ref 4.22–5.81)
RDW: 14 % (ref 11.5–15.5)
WBC: 11 10*3/uL — ABNORMAL HIGH (ref 4.0–10.5)

## 2018-01-07 LAB — C-REACTIVE PROTEIN: CRP: 6.4 mg/dL — ABNORMAL HIGH (ref <1.0)

## 2018-01-07 LAB — SEDIMENTATION RATE: Sed Rate: 41 mm/hr — ABNORMAL HIGH (ref 0–16)

## 2018-01-07 SURGERY — REMOVAL, HARDWARE
Anesthesia: Regional | Site: Ankle | Laterality: Left

## 2018-01-07 MED ORDER — SODIUM CHLORIDE 0.45 % IV SOLN
INTRAVENOUS | Status: DC
  Administered 2018-01-07 – 2018-01-09 (×2): via INTRAVENOUS

## 2018-01-07 MED ORDER — ACETAMINOPHEN 10 MG/ML IV SOLN
INTRAVENOUS | Status: AC
  Administered 2018-01-07: 1000 mg via INTRAVENOUS
  Filled 2018-01-07: qty 100

## 2018-01-07 MED ORDER — LACTATED RINGERS IV SOLN
INTRAVENOUS | Status: DC
  Administered 2018-01-07 (×2): via INTRAVENOUS

## 2018-01-07 MED ORDER — ONDANSETRON HCL 4 MG PO TABS
4.0000 mg | ORAL_TABLET | Freq: Four times a day (QID) | ORAL | Status: DC | PRN
  Administered 2018-01-09 – 2018-01-12 (×4): 4 mg via ORAL
  Filled 2018-01-07 (×4): qty 1

## 2018-01-07 MED ORDER — DEXAMETHASONE SODIUM PHOSPHATE 10 MG/ML IJ SOLN
INTRAMUSCULAR | Status: AC
  Filled 2018-01-07: qty 1

## 2018-01-07 MED ORDER — LOSARTAN POTASSIUM 50 MG PO TABS
50.0000 mg | ORAL_TABLET | Freq: Every day | ORAL | Status: DC
  Administered 2018-01-08 – 2018-01-14 (×7): 50 mg via ORAL
  Filled 2018-01-07 (×7): qty 1

## 2018-01-07 MED ORDER — PROPOFOL 10 MG/ML IV BOLUS
INTRAVENOUS | Status: DC | PRN
  Administered 2018-01-07: 150 mg via INTRAVENOUS

## 2018-01-07 MED ORDER — OXYCODONE HCL 5 MG PO TABS
ORAL_TABLET | ORAL | Status: AC
  Administered 2018-01-07: 5 mg via ORAL
  Filled 2018-01-07: qty 1

## 2018-01-07 MED ORDER — VANCOMYCIN HCL 1000 MG IV SOLR
INTRAVENOUS | Status: AC
  Filled 2018-01-07: qty 1000

## 2018-01-07 MED ORDER — CEFAZOLIN SODIUM-DEXTROSE 2-4 GM/100ML-% IV SOLN
2.0000 g | Freq: Three times a day (TID) | INTRAVENOUS | Status: AC
  Administered 2018-01-07 – 2018-01-10 (×9): 2 g via INTRAVENOUS
  Filled 2018-01-07 (×9): qty 100

## 2018-01-07 MED ORDER — LIDOCAINE 2% (20 MG/ML) 5 ML SYRINGE
INTRAMUSCULAR | Status: AC
  Filled 2018-01-07: qty 5

## 2018-01-07 MED ORDER — ONDANSETRON HCL 4 MG/2ML IJ SOLN
INTRAMUSCULAR | Status: DC | PRN
  Administered 2018-01-07: 4 mg via INTRAVENOUS

## 2018-01-07 MED ORDER — PHENYLEPHRINE HCL 10 MG/ML IJ SOLN
INTRAMUSCULAR | Status: DC | PRN
  Administered 2018-01-07 (×5): 80 ug via INTRAVENOUS

## 2018-01-07 MED ORDER — INSULIN NPH (HUMAN) (ISOPHANE) 100 UNIT/ML ~~LOC~~ SUSP: 35.0000 [IU] | Freq: Every day | SUBCUTANEOUS | Status: DC

## 2018-01-07 MED ORDER — ALPRAZOLAM 0.5 MG PO TABS
1.0000 mg | ORAL_TABLET | Freq: Two times a day (BID) | ORAL | Status: DC
  Administered 2018-01-07 – 2018-01-14 (×14): 1 mg via ORAL
  Filled 2018-01-07 (×14): qty 2

## 2018-01-07 MED ORDER — ONDANSETRON HCL 4 MG/2ML IJ SOLN
INTRAMUSCULAR | Status: AC
  Filled 2018-01-07: qty 2

## 2018-01-07 MED ORDER — FENTANYL CITRATE (PF) 250 MCG/5ML IJ SOLN
INTRAMUSCULAR | Status: AC
  Filled 2018-01-07: qty 5

## 2018-01-07 MED ORDER — INSULIN ASPART 100 UNIT/ML ~~LOC~~ SOLN
0.0000 [IU] | Freq: Every day | SUBCUTANEOUS | Status: DC
  Administered 2018-01-07: 2 [IU] via SUBCUTANEOUS
  Administered 2018-01-12: 3 [IU] via SUBCUTANEOUS

## 2018-01-07 MED ORDER — POLYETHYLENE GLYCOL 3350 17 G PO PACK: 17.0000 g | PACK | Freq: Every day | ORAL | Status: DC | PRN

## 2018-01-07 MED ORDER — ARTIFICIAL TEARS OPHTHALMIC OINT
TOPICAL_OINTMENT | OPHTHALMIC | Status: AC
  Filled 2018-01-07: qty 3.5

## 2018-01-07 MED ORDER — INSULIN ASPART 100 UNIT/ML ~~LOC~~ SOLN
0.0000 [IU] | Freq: Three times a day (TID) | SUBCUTANEOUS | Status: DC
  Administered 2018-01-07: 10 [IU] via SUBCUTANEOUS
  Administered 2018-01-08 (×2): 3 [IU] via SUBCUTANEOUS
  Administered 2018-01-08 – 2018-01-09 (×3): 2 [IU] via SUBCUTANEOUS
  Administered 2018-01-09: 3 [IU] via SUBCUTANEOUS
  Administered 2018-01-10: 2 [IU] via SUBCUTANEOUS
  Administered 2018-01-10: 5 [IU] via SUBCUTANEOUS
  Administered 2018-01-11: 3 [IU] via SUBCUTANEOUS
  Administered 2018-01-12: 2 [IU] via SUBCUTANEOUS
  Administered 2018-01-13: 8 [IU] via SUBCUTANEOUS
  Administered 2018-01-13 (×2): 3 [IU] via SUBCUTANEOUS
  Administered 2018-01-14 (×2): 2 [IU] via SUBCUTANEOUS

## 2018-01-07 MED ORDER — SODIUM CHLORIDE 0.9 % IV SOLN
INTRAVENOUS | Status: DC | PRN
  Administered 2018-01-07: 20 ug/min via INTRAVENOUS

## 2018-01-07 MED ORDER — ENOXAPARIN SODIUM 40 MG/0.4ML ~~LOC~~ SOLN
40.0000 mg | SUBCUTANEOUS | Status: DC
  Administered 2018-01-08 – 2018-01-14 (×6): 40 mg via SUBCUTANEOUS
  Filled 2018-01-07 (×6): qty 0.4

## 2018-01-07 MED ORDER — POLYETHYL GLYCOL-PROPYL GLYCOL 0.4-0.3 % OP GEL: Freq: Two times a day (BID) | OPHTHALMIC | Status: DC

## 2018-01-07 MED ORDER — HYDROXYZINE HCL 25 MG PO TABS
25.0000 mg | ORAL_TABLET | Freq: Three times a day (TID) | ORAL | Status: DC | PRN
  Administered 2018-01-09 – 2018-01-11 (×6): 25 mg via ORAL
  Filled 2018-01-07 (×6): qty 1

## 2018-01-07 MED ORDER — ADULT MULTIVITAMIN W/MINERALS CH
1.0000 | ORAL_TABLET | Freq: Every day | ORAL | Status: DC
  Administered 2018-01-08 – 2018-01-14 (×6): 1 via ORAL
  Filled 2018-01-07 (×6): qty 1

## 2018-01-07 MED ORDER — METOCLOPRAMIDE HCL 5 MG PO TABS: 5.0000 mg | ORAL_TABLET | Freq: Three times a day (TID) | ORAL | Status: DC | PRN

## 2018-01-07 MED ORDER — CEFAZOLIN SODIUM-DEXTROSE 2-3 GM-%(50ML) IV SOLR
INTRAVENOUS | Status: DC | PRN
  Administered 2018-01-07: 2 g via INTRAVENOUS

## 2018-01-07 MED ORDER — B COMPLEX PO TABS: 1.0000 | ORAL_TABLET | Freq: Every day | ORAL | Status: DC

## 2018-01-07 MED ORDER — FLUTICASONE PROPIONATE 50 MCG/ACT NA SUSP
2.0000 | Freq: Every day | NASAL | Status: DC
  Administered 2018-01-07 – 2018-01-14 (×8): 2 via NASAL
  Filled 2018-01-07: qty 16

## 2018-01-07 MED ORDER — FENTANYL CITRATE (PF) 100 MCG/2ML IJ SOLN
INTRAMUSCULAR | Status: AC
  Administered 2018-01-07: 50 ug via INTRAVENOUS
  Filled 2018-01-07: qty 2

## 2018-01-07 MED ORDER — TOBRAMYCIN SULFATE 80 MG/2ML IJ SOLN
INTRAMUSCULAR | Status: AC
  Filled 2018-01-07: qty 2

## 2018-01-07 MED ORDER — TOBRAMYCIN SULFATE 80 MG/2ML IJ SOLN
INTRAMUSCULAR | Status: DC | PRN
  Administered 2018-01-07: 180 mg via INTRAMUSCULAR

## 2018-01-07 MED ORDER — METFORMIN HCL 850 MG PO TABS
850.0000 mg | ORAL_TABLET | Freq: Two times a day (BID) | ORAL | Status: DC
  Administered 2018-01-07 – 2018-01-14 (×12): 850 mg via ORAL
  Filled 2018-01-07 (×15): qty 1

## 2018-01-07 MED ORDER — NICOTINE 14 MG/24HR TD PT24
14.0000 mg | MEDICATED_PATCH | Freq: Every day | TRANSDERMAL | Status: DC
  Administered 2018-01-07 – 2018-01-14 (×7): 14 mg via TRANSDERMAL
  Filled 2018-01-07 (×7): qty 1

## 2018-01-07 MED ORDER — OXYCODONE HCL 5 MG/5ML PO SOLN: 5.0000 mg | Freq: Once | ORAL | Status: AC | PRN

## 2018-01-07 MED ORDER — ACETAMINOPHEN 10 MG/ML IV SOLN
1000.0000 mg | Freq: Once | INTRAVENOUS | Status: DC | PRN
  Administered 2018-01-07: 1000 mg via INTRAVENOUS

## 2018-01-07 MED ORDER — ALBUMIN HUMAN 5 % IV SOLN
INTRAVENOUS | Status: DC | PRN
  Administered 2018-01-07: 11:00:00 via INTRAVENOUS

## 2018-01-07 MED ORDER — VITAMIN D 1000 UNITS PO TABS
2000.0000 [IU] | ORAL_TABLET | Freq: Every day | ORAL | Status: DC
  Administered 2018-01-08 – 2018-01-14 (×6): 2000 [IU] via ORAL
  Filled 2018-01-07 (×6): qty 2

## 2018-01-07 MED ORDER — HYDROCHLOROTHIAZIDE 25 MG PO TABS
25.0000 mg | ORAL_TABLET | Freq: Every day | ORAL | Status: DC
  Administered 2018-01-08 – 2018-01-14 (×6): 25 mg via ORAL
  Filled 2018-01-07 (×6): qty 1

## 2018-01-07 MED ORDER — METOCLOPRAMIDE HCL 5 MG/ML IJ SOLN: 5.0000 mg | Freq: Three times a day (TID) | INTRAMUSCULAR | Status: DC | PRN

## 2018-01-07 MED ORDER — B COMPLEX-C PO TABS
1.0000 | ORAL_TABLET | Freq: Every day | ORAL | Status: DC
  Administered 2018-01-08 – 2018-01-14 (×6): 1 via ORAL
  Filled 2018-01-07 (×7): qty 1

## 2018-01-07 MED ORDER — CHLORHEXIDINE GLUCONATE 4 % EX LIQD: 60.0000 mL | Freq: Once | CUTANEOUS | Status: DC

## 2018-01-07 MED ORDER — DULOXETINE HCL 30 MG PO CPEP
30.0000 mg | ORAL_CAPSULE | Freq: Every day | ORAL | Status: DC
  Administered 2018-01-07 – 2018-01-14 (×7): 30 mg via ORAL
  Filled 2018-01-07 (×7): qty 1

## 2018-01-07 MED ORDER — INSULIN GLARGINE 100 UNIT/ML ~~LOC~~ SOLN
10.0000 [IU] | Freq: Every day | SUBCUTANEOUS | Status: DC
  Administered 2018-01-07 – 2018-01-08 (×2): 10 [IU] via SUBCUTANEOUS
  Administered 2018-01-10: 5 [IU] via SUBCUTANEOUS
  Administered 2018-01-11: 7 [IU] via SUBCUTANEOUS
  Administered 2018-01-12: 10 [IU] via SUBCUTANEOUS
  Filled 2018-01-07 (×7): qty 0.1

## 2018-01-07 MED ORDER — PROPOFOL 10 MG/ML IV BOLUS
INTRAVENOUS | Status: AC
  Filled 2018-01-07: qty 20

## 2018-01-07 MED ORDER — CEFAZOLIN SODIUM-DEXTROSE 1-4 GM/50ML-% IV SOLN
INTRAVENOUS | Status: AC
  Filled 2018-01-07: qty 50

## 2018-01-07 MED ORDER — LATANOPROST 0.005 % OP SOLN
1.0000 [drp] | Freq: Every day | OPHTHALMIC | Status: DC
  Administered 2018-01-07 – 2018-01-13 (×7): 1 [drp] via OPHTHALMIC
  Filled 2018-01-07: qty 2.5

## 2018-01-07 MED ORDER — LIDOCAINE 2% (20 MG/ML) 5 ML SYRINGE
INTRAMUSCULAR | Status: DC | PRN
  Administered 2018-01-07: 100 mg via INTRAVENOUS

## 2018-01-07 MED ORDER — GABAPENTIN 300 MG PO CAPS
300.0000 mg | ORAL_CAPSULE | Freq: Two times a day (BID) | ORAL | Status: DC
  Administered 2018-01-07 – 2018-01-14 (×14): 300 mg via ORAL
  Filled 2018-01-07 (×14): qty 1

## 2018-01-07 MED ORDER — DORZOLAMIDE HCL 2 % OP SOLN
1.0000 [drp] | Freq: Two times a day (BID) | OPHTHALMIC | Status: DC
  Administered 2018-01-08 – 2018-01-14 (×13): 1 [drp] via OPHTHALMIC
  Filled 2018-01-07: qty 10

## 2018-01-07 MED ORDER — HYDROCORTISONE 1 % EX CREA
1.0000 "application " | TOPICAL_CREAM | Freq: Every day | CUTANEOUS | Status: DC | PRN
  Administered 2018-01-09 – 2018-01-13 (×3): 1 via TOPICAL
  Filled 2018-01-07 (×2): qty 28

## 2018-01-07 MED ORDER — FENTANYL CITRATE (PF) 100 MCG/2ML IJ SOLN
50.0000 ug | Freq: Once | INTRAMUSCULAR | Status: AC
  Administered 2018-01-07: 50 ug via INTRAVENOUS

## 2018-01-07 MED ORDER — PHENYLEPHRINE 40 MCG/ML (10ML) SYRINGE FOR IV PUSH (FOR BLOOD PRESSURE SUPPORT)
PREFILLED_SYRINGE | INTRAVENOUS | Status: AC
  Filled 2018-01-07: qty 10

## 2018-01-07 MED ORDER — POLYVINYL ALCOHOL 1.4 % OP SOLN
1.0000 [drp] | Freq: Two times a day (BID) | OPHTHALMIC | Status: DC
  Administered 2018-01-07 – 2018-01-14 (×14): 1 [drp] via OPHTHALMIC
  Filled 2018-01-07: qty 15

## 2018-01-07 MED ORDER — OXYCODONE-ACETAMINOPHEN 5-325 MG PO TABS
1.0000 | ORAL_TABLET | Freq: Four times a day (QID) | ORAL | Status: DC | PRN
  Administered 2018-01-07 – 2018-01-14 (×22): 2 via ORAL
  Filled 2018-01-07 (×23): qty 2

## 2018-01-07 MED ORDER — MIDAZOLAM HCL 2 MG/2ML IJ SOLN
INTRAMUSCULAR | Status: AC
  Administered 2018-01-07: 1 mg via INTRAVENOUS
  Filled 2018-01-07: qty 2

## 2018-01-07 MED ORDER — PROMETHAZINE HCL 25 MG/ML IJ SOLN: 6.2500 mg | INTRAMUSCULAR | Status: DC | PRN

## 2018-01-07 MED ORDER — FENTANYL CITRATE (PF) 100 MCG/2ML IJ SOLN
25.0000 ug | INTRAMUSCULAR | Status: DC | PRN
  Administered 2018-01-07 (×2): 50 ug via INTRAVENOUS

## 2018-01-07 MED ORDER — BUPRENORPHINE HCL-NALOXONE HCL 8-2 MG SL SUBL
1.0000 | SUBLINGUAL_TABLET | Freq: Every day | SUBLINGUAL | Status: DC
  Administered 2018-01-07 – 2018-01-14 (×8): 1 via SUBLINGUAL
  Filled 2018-01-07 (×8): qty 1

## 2018-01-07 MED ORDER — BUPIVACAINE-EPINEPHRINE (PF) 0.5% -1:200000 IJ SOLN
INTRAMUSCULAR | Status: DC | PRN
  Administered 2018-01-07: 10 mL
  Administered 2018-01-07: 20 mL

## 2018-01-07 MED ORDER — DEXAMETHASONE SODIUM PHOSPHATE 4 MG/ML IJ SOLN
INTRAMUSCULAR | Status: DC | PRN
  Administered 2018-01-07: 5 mg via INTRAVENOUS

## 2018-01-07 MED ORDER — POLYETHYLENE GLYCOL 3350 17 GM/SCOOP PO POWD: 1.0000 | Freq: Every day | ORAL | Status: DC | PRN

## 2018-01-07 MED ORDER — LOSARTAN POTASSIUM 25 MG PO TABS: 25.0000 mg | ORAL_TABLET | Freq: Every day | ORAL | Status: DC

## 2018-01-07 MED ORDER — INSULIN NPH (HUMAN) (ISOPHANE) 100 UNIT/ML ~~LOC~~ SUSP
10.0000 [IU] | Freq: Every day | SUBCUTANEOUS | Status: DC
  Filled 2018-01-07: qty 10

## 2018-01-07 MED ORDER — SODIUM CHLORIDE 0.9 % IR SOLN
Status: DC | PRN
  Administered 2018-01-07: 1000 mL

## 2018-01-07 MED ORDER — MIDAZOLAM HCL 2 MG/2ML IJ SOLN
1.0000 mg | Freq: Once | INTRAMUSCULAR | Status: AC
  Administered 2018-01-07: 1 mg via INTRAVENOUS

## 2018-01-07 MED ORDER — DOCUSATE SODIUM 100 MG PO CAPS
100.0000 mg | ORAL_CAPSULE | Freq: Two times a day (BID) | ORAL | Status: DC
  Administered 2018-01-07 – 2018-01-11 (×7): 100 mg via ORAL
  Filled 2018-01-07 (×9): qty 1

## 2018-01-07 MED ORDER — PANTOPRAZOLE SODIUM 40 MG PO TBEC
40.0000 mg | DELAYED_RELEASE_TABLET | Freq: Every day | ORAL | Status: DC
  Administered 2018-01-07 – 2018-01-14 (×8): 40 mg via ORAL
  Filled 2018-01-07 (×8): qty 1

## 2018-01-07 MED ORDER — VITAMIN B-12 1000 MCG PO TABS
2000.0000 ug | ORAL_TABLET | Freq: Every day | ORAL | Status: DC
  Administered 2018-01-08 – 2018-01-14 (×6): 2000 ug via ORAL
  Filled 2018-01-07 (×6): qty 2

## 2018-01-07 MED ORDER — ONDANSETRON HCL 4 MG/2ML IJ SOLN
4.0000 mg | Freq: Four times a day (QID) | INTRAMUSCULAR | Status: DC | PRN
  Administered 2018-01-09: 4 mg via INTRAVENOUS
  Filled 2018-01-07: qty 2

## 2018-01-07 MED ORDER — OXYCODONE HCL 5 MG PO TABS
5.0000 mg | ORAL_TABLET | Freq: Once | ORAL | Status: AC | PRN
  Administered 2018-01-07: 5 mg via ORAL

## 2018-01-07 MED ORDER — VANCOMYCIN HCL 1000 MG IV SOLR
INTRAVENOUS | Status: DC | PRN
  Administered 2018-01-07: 1000 mg

## 2018-01-07 SURGICAL SUPPLY — 54 items
BANDAGE ELASTIC 4 VELCRO ST LF (GAUZE/BANDAGES/DRESSINGS) ×2 IMPLANT
BANDAGE ESMARK 6X9 LF (GAUZE/BANDAGES/DRESSINGS) ×2 IMPLANT
BAR EXFX 350X11 NS LF (EXFIX) ×4
BAR GLASS FIBER EXFX 11X350 (EXFIX) ×4 IMPLANT
BNDG CMPR 9X6 STRL LF SNTH (GAUZE/BANDAGES/DRESSINGS) ×2
BNDG COHESIVE 4X5 TAN STRL (GAUZE/BANDAGES/DRESSINGS) ×2 IMPLANT
BNDG COHESIVE 6X5 TAN STRL LF (GAUZE/BANDAGES/DRESSINGS) ×4 IMPLANT
BNDG ESMARK 6X9 LF (GAUZE/BANDAGES/DRESSINGS) ×4
BNDG GAUZE ELAST 4 BULKY (GAUZE/BANDAGES/DRESSINGS) ×2 IMPLANT
CAP PROTECTIVE TRANSFX 4.5X5MM (EXFIX) ×4 IMPLANT
CLAMP BLUE BAR TO PIN (EXFIX) ×4 IMPLANT
COVER SURGICAL LIGHT HANDLE (MISCELLANEOUS) ×4 IMPLANT
COVER WAND RF STERILE (DRAPES) ×4 IMPLANT
CUFF TOURNIQUET SINGLE 24IN (TOURNIQUET CUFF) IMPLANT
CUFF TOURNIQUET SINGLE 34IN LL (TOURNIQUET CUFF) ×2 IMPLANT
DRAPE C-ARM 42X72 X-RAY (DRAPES) ×2 IMPLANT
DRAPE HALF SHEET 40X57 (DRAPES) ×2 IMPLANT
DRAPE INCISE IOBAN 66X45 STRL (DRAPES) IMPLANT
DRAPE ORTHO SPLIT 77X108 STRL (DRAPES)
DRAPE SURG ORHT 6 SPLT 77X108 (DRAPES) IMPLANT
DRAPE U-SHAPE 47X51 STRL (DRAPES) ×4 IMPLANT
DRSG ADAPTIC 3X8 NADH LF (GAUZE/BANDAGES/DRESSINGS) ×2 IMPLANT
DRSG PAD ABDOMINAL 8X10 ST (GAUZE/BANDAGES/DRESSINGS) ×4 IMPLANT
ELECT REM PT RETURN 9FT ADLT (ELECTROSURGICAL) ×4
ELECTRODE REM PT RTRN 9FT ADLT (ELECTROSURGICAL) ×2 IMPLANT
GAUZE SPONGE 4X4 12PLY STRL LF (GAUZE/BANDAGES/DRESSINGS) ×2 IMPLANT
GAUZE XEROFORM 1X8 LF (GAUZE/BANDAGES/DRESSINGS) ×2 IMPLANT
GLOVE BIOGEL PI IND STRL 8 (GLOVE) ×4 IMPLANT
GLOVE BIOGEL PI INDICATOR 8 (GLOVE) ×4
GLOVE ORTHO TXT STRL SZ7.5 (GLOVE) ×8 IMPLANT
GOWN STRL REUS W/ TWL LRG LVL3 (GOWN DISPOSABLE) ×4 IMPLANT
GOWN STRL REUS W/ TWL XL LVL3 (GOWN DISPOSABLE) ×2 IMPLANT
GOWN STRL REUS W/TWL 2XL LVL3 (GOWN DISPOSABLE) ×4 IMPLANT
GOWN STRL REUS W/TWL LRG LVL3 (GOWN DISPOSABLE) ×8
GOWN STRL REUS W/TWL XL LVL3 (GOWN DISPOSABLE) ×4
KIT BASIN OR (CUSTOM PROCEDURE TRAY) ×4 IMPLANT
KIT STIMULAN RAPID CURE 5CC (Orthopedic Implant) ×2 IMPLANT
KIT TURNOVER KIT B (KITS) ×4 IMPLANT
MANIFOLD NEPTUNE II (INSTRUMENTS) ×4 IMPLANT
NS IRRIG 1000ML POUR BTL (IV SOLUTION) ×4 IMPLANT
PACK GENERAL/GYN (CUSTOM PROCEDURE TRAY) ×4 IMPLANT
PACK ORTHO EXTREMITY (CUSTOM PROCEDURE TRAY) ×4 IMPLANT
PAD ARMBOARD 7.5X6 YLW CONV (MISCELLANEOUS) ×8 IMPLANT
PIN CLAMP 2BAR 75MM BLUE (EXFIX) ×2 IMPLANT
PIN HALF YELLOW 5X160X35 (EXFIX) ×4 IMPLANT
PIN TRANSFIXING 5.0 (EXFIX) ×2 IMPLANT
SPONGE LAP 18X18 X RAY DECT (DISPOSABLE) ×4 IMPLANT
SUT ETHILON 2 0 FS 18 (SUTURE) ×8 IMPLANT
SUT ETHILON 3 0 PS 1 (SUTURE) IMPLANT
TUBE CONNECTING 12'X1/4 (SUCTIONS)
TUBE CONNECTING 12X1/4 (SUCTIONS) IMPLANT
UNDERPAD 30X30 (UNDERPADS AND DIAPERS) ×4 IMPLANT
WATER STERILE IRR 1000ML POUR (IV SOLUTION) ×8 IMPLANT
YANKAUER SUCT BULB TIP NO VENT (SUCTIONS) IMPLANT

## 2018-01-07 NOTE — Transfer of Care (Signed)
Immediate Anesthesia Transfer of Care Note  Patient: Olga Vallery Sa  Procedure(s) Performed: LEFT ANKLE REMOVAL OF HARDWARE, TAKE DOWN OF NONUNION, INSERTION OF ANTIBIOTC BEADS (Left Ankle) EXTERNAL FIXATION LEG, TIBIA TO CALCANEUS (Left )  Patient Location: PACU  Anesthesia Type:General and Regional  Level of Consciousness: awake, alert , oriented and sedated  Airway & Oxygen Therapy: Patient Spontanous Breathing  Post-op Assessment: Report given to RN, Post -op Vital signs reviewed and stable and Patient moving all extremities  Post vital signs: Reviewed and stable  Last Vitals:  Vitals Value Taken Time  BP 139/67 01/07/2018 12:11 PM  Temp    Pulse 98 01/07/2018 12:13 PM  Resp 14 01/07/2018 12:13 PM  SpO2 95 % 01/07/2018 12:13 PM  Vitals shown include unvalidated device data.  Last Pain:  Vitals:   01/07/18 0854  TempSrc:   PainSc: 0-No pain      Patients Stated Pain Goal: 3 (97/41/63 [id number])  Complications: No apparent anesthesia complications

## 2018-01-07 NOTE — Anesthesia Procedure Notes (Addendum)
Anesthesia Regional Block: Popliteal block   Pre-Anesthetic Checklist: ,, timeout performed, Correct Patient, Correct Site, Correct Laterality, Correct Procedure, Correct Position, site marked, Risks and benefits discussed, pre-op evaluation,  At surgeon's request and post-op pain management  Laterality: Left  Prep: Maximum Sterile Barrier Precautions used, Dura Prep       Needles:  Injection technique: Single-shot  Needle Type: Echogenic Stimulator Needle     Needle Length: 9cm  Needle Gauge: 22     Additional Needles:   Procedures:,,,, ultrasound used (permanent image in chart),,,,  Narrative:  Start time: 01/07/2018 8:44 AM End time: 01/07/2018 8:46 AM Injection made incrementally with aspirations every 5 mL.  Performed by: Personally  Anesthesiologist: Brennan Bailey, MD  Additional Notes: Risks, benefits, and alternative discussed. Patient gave consent for procedure. Patient prepped and draped in sterile fashion. Sedation administered, patient remains easily responsive to voice. Relevant anatomy identified with ultrasound guidance. Local anesthetic given in 5cc increments with no signs or symptoms of intravascular injection. No pain or paraesthesias with injection. Patient monitored throughout procedure with signs of LAST or immediate complications. Tolerated well. Ultrasound image placed in chart.  Tawny Asal, MD

## 2018-01-07 NOTE — Anesthesia Postprocedure Evaluation (Signed)
Anesthesia Post Note  Patient: Jason Woods  Procedure(s) Performed: LEFT ANKLE REMOVAL OF HARDWARE, TAKE DOWN OF NONUNION, INSERTION OF ANTIBIOTC BEADS (Left Ankle) EXTERNAL FIXATION LEG, TIBIA TO CALCANEUS (Left )     Patient location during evaluation: PACU Anesthesia Type: Regional Level of consciousness: awake and alert Pain management: pain level controlled Vital Signs Assessment: post-procedure vital signs reviewed and stable Respiratory status: spontaneous breathing, nonlabored ventilation and respiratory function stable Cardiovascular status: blood pressure returned to baseline and stable Postop Assessment: no apparent nausea or vomiting Anesthetic complications: no    Last Vitals:  Vitals:   01/07/18 0854 01/07/18 1210  BP: (!) 150/54   Pulse: 96   Resp: 13   Temp:  (P) 37.2 C  SpO2: 99%     Last Pain:  Vitals:   01/07/18 0854  TempSrc:   PainSc: 0-No pain                 Brennan Bailey

## 2018-01-07 NOTE — Anesthesia Procedure Notes (Signed)
Anesthesia Regional Block: Adductor canal block   Pre-Anesthetic Checklist: ,, timeout performed, Correct Patient, Correct Site, Correct Laterality, Correct Procedure, Correct Position, site marked, Risks and benefits discussed, pre-op evaluation,  At surgeon's request and post-op pain management  Laterality: Left  Prep: Maximum Sterile Barrier Precautions used, Dura Prep       Needles:  Injection technique: Single-shot  Needle Type: Echogenic Stimulator Needle     Needle Length: 9cm  Needle Gauge: 22     Additional Needles:   Procedures:,,,, ultrasound used (permanent image in chart),,,,  Narrative:  Start time: 01/07/2018 8:48 AM End time: 01/07/2018 8:49 AM Injection made incrementally with aspirations every 5 mL.  Performed by: Personally  Anesthesiologist: Brennan Bailey, MD  Additional Notes: Risks, benefits, and alternative discussed. Patient gave consent for procedure. Patient prepped and draped in sterile fashion. Sedation administered, patient remains easily responsive to voice. Relevant anatomy identified with ultrasound guidance. Local anesthetic given in 5cc increments with no signs or symptoms of intravascular injection. No pain or paraesthesias with injection. Patient monitored throughout procedure with signs of LAST or immediate complications. Tolerated well. Ultrasound image placed in chart.  Tawny Asal, MD

## 2018-01-07 NOTE — Interval H&P Note (Signed)
History and Physical Interval Note:  01/07/2018 9:01 AM  [name]  has presented today for surgery, with the diagnosis of left ankle nonunion  The various methods of treatment have been discussed with the patient and family. After consideration of risks, benefits and other options for treatment, the patient has consented to  Procedure(s): LEFT ANKLE REMOVAL OF HARDWARE (Left) EXTERNAL FIXATION LEG, TIBIA TO CALCANEUS (Left) as a surgical intervention .  The patient's history has been reviewed, patient examined, no change in status, stable for surgery.  I have reviewed the patient's chart and labs.  Questions were answered to the patient's satisfaction.     Jason Woods

## 2018-01-07 NOTE — Anesthesia Procedure Notes (Signed)
Procedure Name: LMA Insertion Date/Time: 01/07/2018 9:52 AM Performed by: Scheryl Darter, CRNA Pre-anesthesia Checklist: Patient identified, Emergency Drugs available, Suction available and Patient being monitored Patient Re-evaluated:Patient Re-evaluated prior to induction Oxygen Delivery Method: Circle System Utilized Preoxygenation: Pre-oxygenation with 100% oxygen Induction Type: IV induction Ventilation: Mask ventilation without difficulty LMA: LMA inserted LMA Size: 4.0 Number of attempts: 1 Placement Confirmation: positive ETCO2 and breath sounds checked- equal and bilateral Tube secured with: Tape Dental Injury: Teeth and Oropharynx as per pre-operative assessment  Comments: Applied by [facility]

## 2018-01-07 NOTE — Progress Notes (Signed)
CRITICAL VALUE ALERT  Critical Value:  CBG 467  Date & Time Notied:  01/07/18 4:45pm  Provider Notified: Dr. Lorin Mercy  Orders Received/Actions taken: Pt put on SSI insulin and instructed to give 10 units now  Pt aware and metformin given as well

## 2018-01-07 NOTE — Op Note (Addendum)
Preop diagnosis: Left ankle trimalleolar ankle fracture with nonunion and displacement.  Hardware failure.  Postop diagnosis: Same  Procedure: Takedown nonunion removal of hardware medial and lateral malleolus.  External fixator application across the ankle, tibia to calcaneus.placement, placement absorbable antibiotic beads, medial and latera.   Surgeon: Rodell Perna, MD  Anesthesia: General  Tourniquet: 350 times approximately 1 hour see anesthetic record  Brief history 72 year old male suffered an open trimalleolar ankle fracture dislocation 3 months ago.  He was taken the operating room underwent washout and sharp excisional debridement with medial and lateral fixation and syndesmotic screw.  Syndesmosis screw was removed at approximately 10 weeks and in the last week he has had increased pain angulation and displacement.  He had not had any purulent drainage no fever or chills.  He had been placed on some antibiotics for bronchitis problem by his PCP recently.  X-rays taken yesterday in the 8 office demonstrated displacement of the medial malleolus with subluxation of the ankle posterior medial and pullout of the distal locking screws from the fibula consistent with nonunion.  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  Procedure: After induction general anesthesia proximal thigh tourniquet 1015 drape applied proximally DuraPrep from the tip the toes to the tourniquet extremity sheets and drapes was performed timeout procedure was completed antibiotics were held until cultures were obtained.  External fixator was placed under C arm that was sterilely draped with 2 tibial pins placed bicortical confirmed with C arm.  Trans-calcaneal pin was placed under fluoroscopy and old medial incision was opened extended  proximally and distally.  2 medial malleolar screws were identified.  Medial malleolus and posterior malleolar fragment had displaced posterior and medially with dense fibrous tissue.  Synovial fluid was present cultures were obtained there was no purulent material no necrotic tissue no pus was found.  Fibrous tissue was extensive and had to be sharply resected with curettes, rondure.  Lateral incision was opened screws and plate was removed fibular nonunion showed trace motion and using curette the nonunion was taken down to the fibula could be mobilized laterally and realign the distal aspect of the tibia with the tibial shaft.  External fixator was tightened under C arm once it was in better position and then continued resection of fibrous tissue.  Some antibiotic beads were placed even though it did not appear that was infected and observable antibiotic beads small beads were placed into the ankle joint some lateral incision some medial and then reapproximation of the skin with 2-0 nylon with final spot fluoroscopic pictures were taken and confirmation that all external fixator screws were tightened down securely.  There was palpable pulse good capillary refill satisfactory alignment.  Postop dressing was applied patient was transferred to recovery room.  Plan is IV antibiotics pending culture results usually at 48 to 72 hours.  If there is no evidence of infection and we can plan on ankle fusion with rod placed up to the calcaneus into the tibia with stabilization.  Plan was discussed extensively preop and postop with patient's sister who is a Marine scientist, patient's daughter and other family members who are present.

## 2018-01-08 LAB — GLUCOSE, CAPILLARY
GLUCOSE-CAPILLARY: 177 mg/dL — AB (ref 70–99)
Glucose-Capillary: 171 mg/dL — ABNORMAL HIGH (ref 70–99)
Glucose-Capillary: 142 mg/dL — ABNORMAL HIGH (ref 70–99)
Glucose-Capillary: 148 mg/dL — ABNORMAL HIGH (ref 70–99)

## 2018-01-08 NOTE — Care Management Note (Signed)
Case Management Note  Patient Details  Name: AZAI GAFFIN MRN: [id number] Date of Birth: 08/24/1945  Subjective/Objective:    Pt presents from home alone for revision of left ankle fracture.  Pt is [facility] with [facility] for [facility] and has a wheelchair, RW, 3n1, and cane through [facility].    Pt requests personal care assistant to stay several hours a day in addition to [facility] services.  Pt's brother is available to stay at night and family members are "in and out" during the day.  Pt's daughter is also available to assist, but patient seems anxious about going home alone.  Advised patient that personal care services have to be arranged through private pay or Medicaid, which can take several weeks and has to be done by a community Education officer, museum.  Pt states he cannot afford to privately pay and insists that Medicaid should pay for it.  Discussed at length with patient that Medicaid may provide personal care services but will not be for several weeks.  Encouraged patient that if he feels his family will not be able to care for him during the day and he is not safe at home, he should consider a SNF for a few weeks.      In addition, pt is not sure if WC can accommodate leg elevating extensions.            Action/Plan: HH order modified to include HH PT, RN, NA and SW.  Discussed HH and WC with Jermaine from [facility]  Brenton Grills advises that patient or family member take [facility] by an [facility] store to have leg extensions assessed or PT can do it when come to house.    Expected Discharge Date:                  Expected Discharge Plan:  [place]  In-House Referral:  NA  Discharge planning Services  CM Consult  Post Acute Care Choice:  Durable Medical Equipment, Home Health Choice offered to:  Patient  DME Arranged:    DME Agency:     HH Arranged:  RN, PT, Nurse's Aide, Social Work [employer] Agency:  [place]  Status of Service:  In process, will continue to follow  If discussed at Long  Length of Stay Meetings, dates discussed:    Additional Comments:  Claudie Leach, RN 01/08/2018, 11:24 AM

## 2018-01-08 NOTE — Progress Notes (Addendum)
   Subjective: 1 Day Post-Op Procedure(s) (LRB): LEFT ANKLE REMOVAL OF HARDWARE, TAKE DOWN OF NONUNION, INSERTION OF ANTIBIOTC BEADS (Left) EXTERNAL FIXATION LEG, TIBIA TO CALCANEUS (Left) Patient reports pain as 3 on 0-10 scale.    Objective: Vital signs in last 24 hours: Temp:  [97.6 F (36.4 C)-99 F (37.2 C)] 97.6 F (36.4 C) (10/19 0806) Pulse Rate:  [78-99] 85 (10/19 0806) Resp:  [11-20] 16 (10/19 0806) BP: (126-143)/(60-78) 137/63 (10/19 0806) SpO2:  [93 %-100 %] 100 % (10/19 0806)  Intake/Output from previous day: 10/18 0701 - 10/19 0700 In: 2198.7 [I.V.:1848.6; IV Piggyback:350.1] Out: 1300 [Urine:1250; Blood:50] Intake/Output this shift: Total I/O In: -  Out: 500 [Urine:500]  Recent Labs    01/07/18 0729  HGB 12.0*   Recent Labs    01/07/18 0729  WBC 11.0*  RBC 4.76  HCT 39.0  PLT 455*   Recent Labs    01/07/18 0729  NA 131*  K 3.7  CL 89*  CO2 27  BUN 7*  CREATININE 0.69  GLUCOSE 116*  CALCIUM 9.3   No results for input(s): LABPT, INR in the last 72 hours.  fixator intact Dg Ankle Complete Left  Result Date: 01/07/2018 CLINICAL DATA:  LEFT ankle ex fix EXAM: DG C-ARM 61-120 MIN; LEFT ANKLE COMPLETE - 3+ VIEW COMPARISON:  None. FINDINGS: Five intraoperative fluoroscopic images are provided of the LEFT lower extremity. Images show placement of external fixation hardware. Fluoroscopy provided for 20 seconds. IMPRESSION: Intraoperative fluoroscopic images. No evidence of surgical complicating feature. Electronically Signed   By: Franki Cabot M.D.   On: 01/07/2018 12:44   Dg C-arm 1-60 Min  Result Date: 01/07/2018 CLINICAL DATA:  LEFT ankle ex fix EXAM: DG C-ARM 61-120 MIN; LEFT ANKLE COMPLETE - 3+ VIEW COMPARISON:  None. FINDINGS: Five intraoperative fluoroscopic images are provided of the LEFT lower extremity. Images show placement of external fixation hardware. Fluoroscopy provided for 20 seconds. IMPRESSION: Intraoperative fluoroscopic images.  No evidence of surgical complicating feature. Electronically Signed   By: Franki Cabot M.D.   On: 01/07/2018 12:44   Dg C-arm 1-60 Min  Result Date: 01/07/2018 CLINICAL DATA:  LEFT ankle ex fix EXAM: DG C-ARM 61-120 MIN; LEFT ANKLE COMPLETE - 3+ VIEW COMPARISON:  None. FINDINGS: Five intraoperative fluoroscopic images are provided of the LEFT lower extremity. Images show placement of external fixation hardware. Fluoroscopy provided for 20 seconds. IMPRESSION: Intraoperative fluoroscopic images. No evidence of surgical complicating feature. Electronically Signed   By: Franki Cabot M.D.   On: 01/07/2018 12:44    Assessment/Plan: 1 Day Post-Op Procedure(s) (LRB): LEFT ANKLE REMOVAL OF HARDWARE, TAKE DOWN OF NONUNION, INSERTION OF ANTIBIOTC BEADS (Left) EXTERNAL FIXATION LEG, TIBIA TO CALCANEUS (Left) Up with therapy, NWB . Gram stain  Two  neg, one gram variable rods.   Will continue ABX and await cultures.    [name] 01/08/2018, 11:39 AM

## 2018-01-08 NOTE — Evaluation (Signed)
Physical Therapy Evaluation Patient Details Name: Jason Woods MRN: [id number] DOB: 05/11/1945 Today's Date: 01/08/2018   History of Present Illness  Pt is a 72 y.o. male with PMH of HTN, DM, and legally blind. Pt fell at home July 2019 sustaining L ankle fx. He underwent ORIF 10/05/17. This repair failed as xrays revealed hardware movement and nonunion. He underwent hardware removal and external fixation 01/07/18.     Clinical Impression  Pt admitted with above diagnosis. Pt currently with functional limitations due to the deficits listed below (see PT Problem List). On eval, pt required supervision bed mobility, min guard assist transfers, and min guard assist gait 5 feet with RW. He has intermittent help at home from family. He would benefit from home health aide a few hours a day to assist with bADLs and iADLs.  Pt will benefit from skilled PT to increase their independence and safety with mobility to allow discharge to the venue listed below.       Follow Up Recommendations Home health PT;Other (comment)(home health aide)    Equipment Recommendations  Other (comment)(elevating leg rests for w/c, will need new w/c if unable to get new legrests for his current w/c)    Recommendations for Other Services       Precautions / Restrictions Precautions Precautions: Fall;Other (comment) Precaution Comments: ext fix L ankle Restrictions Weight Bearing Restrictions: Yes LLE Weight Bearing: Non weight bearing      Mobility  Bed Mobility Overal bed mobility: Needs Assistance Bed Mobility: Supine to Sit     Supine to sit: Supervision;HOB elevated     General bed mobility comments: supervision for safety, no physical assist  Transfers Overall transfer level: Needs assistance Equipment used: Rolling walker (2 wheeled) Transfers: Sit to/from Stand Sit to Stand: Min guard         General transfer comment: min guard for safety, Pt demo safe  technique.  Ambulation/Gait Ambulation/Gait assistance: Min guard Gait Distance (Feet): 5 Feet Assistive device: Rolling walker (2 wheeled) Gait Pattern/deviations: Step-to pattern Gait velocity: decreased   General Gait Details: Pt able to maintain NWB LLE.  Stairs            Wheelchair Mobility    Modified Rankin (Stroke Patients Only)       Balance Overall balance assessment: Needs assistance Sitting-balance support: No upper extremity supported;Feet supported Sitting balance-Leahy Scale: Normal     Standing balance support: Bilateral upper extremity supported;During functional activity Standing balance-Leahy Scale: Poor Standing balance comment: reliant on RW due to NWB LLE                             Pertinent Vitals/Pain Pain Assessment: 0-10 Pain Score: 3  Pain Location: L ankle Pain Descriptors / Indicators: Sore Pain Intervention(s): Monitored during session;Repositioned    Home Living Family/patient expects to be discharged to:: Private residence Living Arrangements: Other relatives(brother, daughter) Available Help at Discharge: Family;Available PRN/intermittently Type of Home: House Home Access: Ramped entrance     Home Layout: One level Home Equipment: Walker - 2 wheels;Wheelchair - manual      Prior Function Level of Independence: Independent         Comments: does not drive     Hand Dominance        Extremity/Trunk Assessment   Upper Extremity Assessment Upper Extremity Assessment: Overall WFL for tasks assessed    Lower Extremity Assessment Lower Extremity Assessment: LLE deficits/detail LLE Deficits / Details:  ext fix L ankle    Cervical / Trunk Assessment Cervical / Trunk Assessment: Normal  Communication   Communication: No difficulties  Cognition Arousal/Alertness: Awake/alert Behavior During Therapy: WFL for tasks assessed/performed Overall Cognitive Status: Within Functional Limits for tasks  assessed                                        General Comments      Exercises     Assessment/Plan    PT Assessment Patient needs continued PT services  PT Problem List Decreased mobility;Decreased knowledge of precautions;Decreased activity tolerance;Pain;Decreased balance;Decreased knowledge of use of DME       PT Treatment Interventions DME instruction;Therapeutic activities;Gait training;Therapeutic exercise;Patient/family education;Balance training;Functional mobility training    PT Goals (Current goals can be found in the Care Plan section)  Acute Rehab PT Goals Patient Stated Goal: home PT Goal Formulation: With patient Time For Goal Achievement: 01/22/18 Potential to Achieve Goals: Good    Frequency Min 3X/week   Barriers to discharge Decreased caregiver support      Co-evaluation               AM-PAC PT "6 Clicks" Daily Activity  Outcome Measure Difficulty turning over in bed (including adjusting bedclothes, sheets and blankets)?: A Little Difficulty moving from lying on back to sitting on the side of the bed? : A Little Difficulty sitting down on and standing up from a chair with arms (e.g., wheelchair, bedside commode, etc,.)?: A Little Help needed moving to and from a bed to chair (including a wheelchair)?: A Little Help needed walking in hospital room?: A Little Help needed climbing 3-5 steps with a railing? : A Lot 6 Click Score: 17    End of Session Equipment Utilized During Treatment: Gait belt Activity Tolerance: Patient tolerated treatment well Patient left: in chair;with call bell/phone within reach Nurse Communication: Mobility status PT Visit Diagnosis: Other abnormalities of gait and mobility (R26.89);Difficulty in walking, not elsewhere classified (R26.2);Pain Pain - Right/Left: Left Pain - part of body: Ankle and joints of foot    Time: 5701-7793 PT Time Calculation (min) (ACUTE ONLY): 19 min   Charges:   PT  Evaluation $PT Eval Low Complexity: 1 Low          Lorrin Goodell, PT  Office # [phone] Pager [phone]   Lorriane Shire 01/08/2018, 11:05 AM

## 2018-01-08 NOTE — Plan of Care (Signed)
  Problem: Activity: Goal: Risk for activity intolerance will decrease Outcome: Progressing   Problem: Nutrition: Goal: Adequate nutrition will be maintained Outcome: Progressing   Problem: Safety: Goal: Ability to remain free from injury will improve Outcome: Progressing   Problem: Skin Integrity: Goal: Risk for impaired skin integrity will decrease Outcome: Progressing   

## 2018-01-09 LAB — GLUCOSE, CAPILLARY
GLUCOSE-CAPILLARY: 124 mg/dL — AB (ref 70–99)
GLUCOSE-CAPILLARY: 147 mg/dL — AB (ref 70–99)
GLUCOSE-CAPILLARY: 116 mg/dL — AB (ref 70–99)
Glucose-Capillary: 157 mg/dL — ABNORMAL HIGH (ref 70–99)

## 2018-01-09 NOTE — Progress Notes (Signed)
   Subjective: 2 Days Post-Op Procedure(s) (LRB): LEFT ANKLE REMOVAL OF HARDWARE, TAKE DOWN OF NONUNION, INSERTION OF ANTIBIOTC BEADS (Left) EXTERNAL FIXATION LEG, TIBIA TO CALCANEUS (Left) Patient reports pain as mild and moderate.    Objective: Vital signs in last 24 hours: Temp:  [97.7 F (36.5 C)-98.1 F (36.7 C)] 97.7 F (36.5 C) (10/20 0455) Pulse Rate:  [75-85] 85 (10/20 0455) Resp:  [17-18] 18 (10/20 0455) BP: (139-162)/(66-73) 159/73 (10/20 0455) SpO2:  [100 %] 100 % (10/20 0455)  Intake/Output from previous day: 10/19 0701 - 10/20 0700 In: -  Out: 975 [Urine:975] Intake/Output this shift: Total I/O In: 480 [P.O.:480] Out: 380 [Urine:380]  Recent Labs    01/07/18 0729  HGB 12.0*   Recent Labs    01/07/18 0729  WBC 11.0*  RBC 4.76  HCT 39.0  PLT 455*   Recent Labs    01/07/18 0729  NA 131*  K 3.7  CL 89*  CO2 27  BUN 7*  CREATININE 0.69  GLUCOSE 116*  CALCIUM 9.3   No results for input(s): LABPT, INR in the last 72 hours.  ext, fix intact.  No results found.  Assessment/Plan: 2 Days Post-Op Procedure(s) (LRB): LEFT ANKLE REMOVAL OF HARDWARE, TAKE DOWN OF NONUNION, INSERTION OF ANTIBIOTC BEADS (Left) EXTERNAL FIXATION LEG, TIBIA TO CALCANEUS (Left) Plan: continue IV ABX. Cultures all neg so far.  If remain neg then surgery next week for ankle fusion  [name] 01/09/2018, 12:08 PM

## 2018-01-10 ENCOUNTER — Encounter (HOSPITAL_COMMUNITY): Payer: Self-pay | Admitting: Orthopaedic Surgery

## 2018-01-10 LAB — GLUCOSE, CAPILLARY
GLUCOSE-CAPILLARY: 215 mg/dL — AB (ref 70–99)
Glucose-Capillary: 131 mg/dL — ABNORMAL HIGH (ref 70–99)
Glucose-Capillary: 142 mg/dL — ABNORMAL HIGH (ref 70–99)
Glucose-Capillary: 131 mg/dL — ABNORMAL HIGH (ref 70–99)
Glucose-Capillary: 97 mg/dL (ref 70–99)
Glucose-Capillary: 122 mg/dL — ABNORMAL HIGH (ref 70–99)

## 2018-01-10 LAB — MRSA PCR SCREENING: MRSA BY PCR: NEGATIVE

## 2018-01-10 NOTE — Plan of Care (Signed)
  Problem: Clinical Measurements: Goal: Will remain free from infection Outcome: Progressing Goal: Cardiovascular complication will be avoided Outcome: Progressing   Problem: Activity: Goal: Risk for activity intolerance will decrease Outcome: Progressing   Problem: Nutrition: Goal: Adequate nutrition will be maintained Outcome: Progressing   Problem: Coping: Goal: Level of anxiety will decrease Outcome: Progressing   Problem: Elimination: Goal: Will not experience complications related to bowel motility Outcome: Progressing Goal: Will not experience complications related to urinary retention Outcome: Progressing   Problem: Elimination: Goal: Will not experience complications related to bowel motility Outcome: Progressing Goal: Will not experience complications related to urinary retention Outcome: Progressing   Problem: Pain Managment: Goal: General experience of comfort will improve Outcome: Progressing   Problem: Safety: Goal: Ability to remain free from injury will improve Outcome: Progressing   Problem: Skin Integrity: Goal: Risk for impaired skin integrity will decrease Outcome: Progressing

## 2018-01-10 NOTE — Progress Notes (Signed)
   Subjective: 3 Days Post-Op Procedure(s) (LRB): LEFT ANKLE REMOVAL OF HARDWARE, TAKE DOWN OF NONUNION, INSERTION OF ANTIBIOTC BEADS (Left) EXTERNAL FIXATION LEG, TIBIA TO CALCANEUS (Left) Patient reports pain as mild and moderate.    Objective: Vital signs in last 24 hours: Temp:  [97.9 F (36.6 C)] 97.9 F (36.6 C) (10/21 0554) Pulse Rate:  [73-78] 78 (10/21 0554) BP: (149-158)/(71-74) 149/71 (10/21 0554) SpO2:  [97 %] 97 % (10/21 0554)  Intake/Output from previous day: 10/20 0701 - 10/21 0700 In: 480 [P.O.:480] Out: 2080 [Urine:2080] Intake/Output this shift: Total I/O In: -  Out: 300 [Urine:300]  No results for input(s): HGB in the last 72 hours. No results for input(s): WBC, RBC, HCT, PLT in the last 72 hours. No results for input(s): NA, K, CL, CO2, BUN, CREATININE, GLUCOSE, CALCIUM in the last 72 hours. No results for input(s): LABPT, INR in the last 72 hours.  Neurologically intact . Dressing removed. No cellulitis no drainage. mepilex dressing placed medial and lateral incisions. Pin tracts look good.  No results found.  Assessment/Plan: 3 Days Post-Op Procedure(s) (LRB): LEFT ANKLE REMOVAL OF HARDWARE, TAKE DOWN OF NONUNION, INSERTION OF ANTIBIOTC BEADS (Left) EXTERNAL FIXATION LEG, TIBIA TO CALCANEUS (Left) Up with therapy, NWB.  Plan ankle fusion Wednesday afternoon if cultures remain negative. Discussed in detail with patient once again.   Jason Woods 01/10/2018, 5:32 PM

## 2018-01-10 NOTE — Plan of Care (Signed)

## 2018-01-10 NOTE — Progress Notes (Addendum)
Patient ID: Jason Woods, male   DOB: 1946-02-19, 72 y.o.   MRN: [id number] Cultures remain no growth. Will schedule for surgery Wednesday afternoon for ext fix removal and ankle fusion with IM transcalcaneal fusion rod.  CMET and CBC with diff in AM.   Sed rate was 41 on admission . Discussed plan with patient. May need SNF post op.

## 2018-01-10 NOTE — Progress Notes (Addendum)
Physical Therapy Treatment Patient Details Name: Jason Woods MRN: [id number] DOB: [birth year] Today's Date: 01/10/2018    History of Present Illness Pt is a 72 y.o. male with PMH of HTN, DM, and legally blind. Pt fell at home July 2019 sustaining L ankle fx. He underwent ORIF 10/05/17. This repair failed as xrays revealed hardware movement and nonunion. He underwent hardware removal and external fixation 01/07/18. Awaiting ankle fusion surgery.     PT Comments    Pt is making progress towards his goals and is able to maintain L LE non-weightbearing with min guard assist, however pt requires minA for set up for ADLs to maintain NWB. Pt is supervision for bed mobility and minA for transfers today. Pt is worried about losing his foot and maintaining L LE NWB in his home environment and is requesting SNF level rehab at [facility]. Given pt's current level of function PT agrees with recommendation for SNF level rehab at d/c. PT will continue to follow acutely.   Follow Up Recommendations  SNF     Equipment Recommendations  Other (comment)(elevating leg rests for w/c, will need new w/c if unable to get new legrests for his current w/c)    Recommendations for Other Services       Precautions / Restrictions Precautions Precautions: Fall;Other (comment) Precaution Comments: ext fix L ankle Restrictions Weight Bearing Restrictions: Yes LLE Weight Bearing: Non weight bearing    Mobility  Bed Mobility Overal bed mobility: Needs Assistance Bed Mobility: Supine to Sit     Supine to sit: Supervision;HOB elevated     General bed mobility comments: supervision for safety, no physical assist  Transfers Overall transfer level: Needs assistance Equipment used: Rolling walker (2 wheeled) Transfers: Sit to/from [facility] Sit to Stand: Min guard Stand pivot transfers: Min assist       General transfer comment: min guard for safety on sit>stand, minA  for stand pivot bed to [facility], BSC to recliner Pt demo safe technique.  Ambulation/Gait             General Gait Details: did not attempt due to pain in dependent position after using BSC         Balance Overall balance assessment: Needs assistance Sitting-balance support: No upper extremity supported;Feet supported Sitting balance-Leahy Scale: Normal     Standing balance support: Bilateral upper extremity supported;During functional activity Standing balance-Leahy Scale: Poor Standing balance comment: reliant on RW due to NWB LLE                            Cognition Arousal/Alertness: Awake/alert Behavior During Therapy: WFL for tasks assessed/performed Overall Cognitive Status: Within Functional Limits for tasks assessed                                        Exercises      General Comments General comments (skin integrity, edema, etc.): pt requesting SNF to get strong enough to be independent, as he is min guard for mobility but requires assist for set up and safety       Pertinent Vitals/Pain Pain Assessment: 0-10 Pain Score: 7  Pain Location: L ankle Pain Descriptors / Indicators: Sore Pain Intervention(s): Limited activity within patient's tolerance;Monitored during session;Repositioned           PT Goals (current goals can now be found in the care  plan section) Acute Rehab PT Goals Patient Stated Goal: go to rehab for a while  PT Goal Formulation: With patient Time For Goal Achievement: 01/22/18 Potential to Achieve Goals: Good Progress towards PT goals: Progressing toward goals    Frequency    Min 3X/week      PT Plan Discharge plan needs to be updated       AM-PAC PT "6 Clicks" Daily Activity  Outcome Measure  Difficulty turning over in bed (including adjusting bedclothes, sheets and blankets)?: A Little Difficulty moving from lying on back to sitting on the side of the bed? : A Little Difficulty sitting down  on and standing up from a chair with arms (e.g., wheelchair, bedside commode, etc,.)?: A Little Help needed moving to and from a bed to chair (including a wheelchair)?: A Little Help needed walking in hospital room?: A Little Help needed climbing 3-5 steps with a railing? : A Lot 6 Click Score: 17    End of Session Equipment Utilized During Treatment: Gait belt Activity Tolerance: Patient tolerated treatment well Patient left: in chair;with call bell/phone within reach Nurse Communication: Mobility status PT Visit Diagnosis: Other abnormalities of gait and mobility (R26.89);Difficulty in walking, not elsewhere classified (R26.2);Pain Pain - Right/Left: Left Pain - part of body: Ankle and joints of foot     Time: 8563-1497 PT Time Calculation (min) (ACUTE ONLY): 26 min  Charges:  $Therapeutic Activity: 23-37 mins                     Anastaisa Wooding B. Migdalia Dk PT, DPT Acute Rehabilitation Services Pager [phone] Office [phone]    [place] 01/10/2018, 11:28 AM

## 2018-01-10 NOTE — Care Management Important Message (Signed)
Important Message  Patient Details  Name: Jason Woods MRN: [id number] Date of Birth: 11-Jun-1945   Medicare Important Message Given:  Yes    [name] 01/10/2018, 3:29 PM

## 2018-01-11 LAB — CBC WITH DIFFERENTIAL/PLATELET
Abs Immature Granulocytes: 0.05 10*3/uL (ref 0.00–0.07)
BASOS ABS: 0.1 10*3/uL (ref 0.0–0.1)
BASOS PCT: 1 %
EOS ABS: 0.4 10*3/uL (ref 0.0–0.5)
Eosinophils Relative: 5 %
HCT: 34.1 % — ABNORMAL LOW (ref 39.0–52.0)
Hemoglobin: 11 g/dL — ABNORMAL LOW (ref 13.0–17.0)
IMMATURE GRANULOCYTES: 1 %
LYMPHS ABS: 1.7 10*3/uL (ref 0.7–4.0)
Lymphocytes Relative: 21 %
MCH: 25.9 pg — ABNORMAL LOW (ref 26.0–34.0)
MCHC: 32.3 g/dL (ref 30.0–36.0)
MCV: 80.2 fL (ref 80.0–100.0)
Monocytes Absolute: 0.7 10*3/uL (ref 0.1–1.0)
Monocytes Relative: 9 %
NEUTROS PCT: 63 %
Neutro Abs: 5.1 10*3/uL (ref 1.7–7.7)
PLATELETS: 365 10*3/uL (ref 150–400)
RBC: 4.25 MIL/uL (ref 4.22–5.81)
RDW: 13.9 % (ref 11.5–15.5)
WBC: 8.1 10*3/uL (ref 4.0–10.5)
nRBC: 0 % (ref 0.0–0.2)

## 2018-01-11 LAB — COMPREHENSIVE METABOLIC PANEL
ALBUMIN: 2.8 g/dL — AB (ref 3.5–5.0)
ALT: 6 U/L (ref 0–44)
AST: 17 U/L (ref 15–41)
Alkaline Phosphatase: 91 U/L (ref 38–126)
Anion gap: 10 (ref 5–15)
BUN: 10 mg/dL (ref 8–23)
CO2: 30 mmol/L (ref 22–32)
Calcium: 8.8 mg/dL — ABNORMAL LOW (ref 8.9–10.3)
Chloride: 89 mmol/L — ABNORMAL LOW (ref 98–111)
Creatinine, Ser: 0.72 mg/dL (ref 0.61–1.24)
GFR calc Af Amer: >60 mL/min (ref >60)
GFR calc non Af Amer: >60 mL/min (ref >60)
GLUCOSE: 125 mg/dL — AB (ref 70–99)
POTASSIUM: 3.5 mmol/L (ref 3.5–5.1)
Sodium: 129 mmol/L — ABNORMAL LOW (ref 135–145)
Total Bilirubin: 0.5 mg/dL (ref 0.3–1.2)
Total Protein: 6 g/dL — ABNORMAL LOW (ref 6.5–8.1)

## 2018-01-11 LAB — GLUCOSE, CAPILLARY
GLUCOSE-CAPILLARY: 119 mg/dL — AB (ref 70–99)
GLUCOSE-CAPILLARY: 152 mg/dL — AB (ref 70–99)
Glucose-Capillary: 160 mg/dL — ABNORMAL HIGH (ref 70–99)
Glucose-Capillary: 82 mg/dL (ref 70–99)

## 2018-01-11 NOTE — Progress Notes (Signed)
    Subjective: 4 Days Post-Op Procedure(s) (LRB): LEFT ANKLE REMOVAL OF HARDWARE, TAKE DOWN OF NONUNION, INSERTION OF ANTIBIOTC BEADS (Left) EXTERNAL FIXATION LEG, TIBIA TO CALCANEUS (Left) Patient reports pain as moderate.    Objective: Vital signs in last 24 hours: Temp:  [97.9 F (36.6 C)-98 F (36.7 C)] 98 F (36.7 C) (10/22 0458) Pulse Rate:  [73-80] 79 (10/22 0458) Resp:  [18-20] 20 (10/22 0458) BP: (133-148)/(59-77) 145/77 (10/22 0458) SpO2:  [97 %-100 %] 97 % (10/22 0458)  Intake/Output from previous day: 10/21 0701 - 10/22 0700 In: 480 [P.O.:480] Out: 1550 [Urine:1550] Intake/Output this shift: No intake/output data recorded.  Recent Labs    01/11/18 0552  HGB 11.0*   Recent Labs    01/11/18 0552  WBC 8.1  RBC 4.25  HCT 34.1*  PLT 365   Recent Labs    01/11/18 0552  NA 129*  K 3.5  CL 89*  CO2 30  BUN 10  CREATININE 0.72  GLUCOSE 125*  CALCIUM 8.8*   No results for input(s): LABPT, INR in the last 72 hours.  external fixator intact. no incision drainage.  No results found.  Assessment/Plan: 4 Days Post-Op Procedure(s) (LRB): LEFT ANKLE REMOVAL OF HARDWARE, TAKE DOWN OF NONUNION, INSERTION OF ANTIBIOTC BEADS (Left) EXTERNAL FIXATION LEG, TIBIA TO CALCANEUS (Left) Up with therapy, all cultures neg , surgery Wednesday afternoon.   [name] 01/11/2018, 7:49 AM

## 2018-01-11 NOTE — Plan of Care (Signed)

## 2018-01-11 NOTE — H&P (View-Only) (Signed)
    Subjective: 4 Days Post-Op Procedure(s) (LRB): LEFT ANKLE REMOVAL OF HARDWARE, TAKE DOWN OF NONUNION, INSERTION OF ANTIBIOTC BEADS (Left) EXTERNAL FIXATION LEG, TIBIA TO CALCANEUS (Left) Patient reports pain as moderate.    Objective: Vital signs in last 24 hours: Temp:  [97.9 F (36.6 C)-98 F (36.7 C)] 98 F (36.7 C) (10/22 0458) Pulse Rate:  [73-80] 79 (10/22 0458) Resp:  [18-20] 20 (10/22 0458) BP: (133-148)/(59-77) 145/77 (10/22 0458) SpO2:  [97 %-100 %] 97 % (10/22 0458)  Intake/Output from previous day: 10/21 0701 - 10/22 0700 In: 480 [P.O.:480] Out: 1550 [Urine:1550] Intake/Output this shift: No intake/output data recorded.  Recent Labs    01/11/18 0552  HGB 11.0*   Recent Labs    01/11/18 0552  WBC 8.1  RBC 4.25  HCT 34.1*  PLT 365   Recent Labs    01/11/18 0552  NA 129*  K 3.5  CL 89*  CO2 30  BUN 10  CREATININE 0.72  GLUCOSE 125*  CALCIUM 8.8*   No results for input(s): LABPT, INR in the last 72 hours.  external fixator intact. no incision drainage.  No results found.  Assessment/Plan: 4 Days Post-Op Procedure(s) (LRB): LEFT ANKLE REMOVAL OF HARDWARE, TAKE DOWN OF NONUNION, INSERTION OF ANTIBIOTC BEADS (Left) EXTERNAL FIXATION LEG, TIBIA TO CALCANEUS (Left) Up with therapy, all cultures neg , surgery Wednesday afternoon.   [name] 01/11/2018, 7:49 AM

## 2018-01-12 ENCOUNTER — Inpatient Hospital Stay (HOSPITAL_COMMUNITY): Payer: Medicare Other | Admitting: Registered Nurse

## 2018-01-12 ENCOUNTER — Encounter (HOSPITAL_COMMUNITY): Payer: Self-pay | Admitting: Certified Registered Nurse Anesthetist

## 2018-01-12 ENCOUNTER — Inpatient Hospital Stay (HOSPITAL_COMMUNITY): Payer: Medicare Other

## 2018-01-12 ENCOUNTER — Encounter (HOSPITAL_COMMUNITY)
Admission: RE | Disposition: A | Discharge status: Home Health | Payer: Self-pay | Source: Home / Self Care | Attending: Orthopaedic Surgery

## 2018-01-12 DIAGNOSIS — S82842K Displaced bimalleolar fracture of left lower leg, subsequent encounter for closed fracture with nonunion: Secondary | ICD-10-CM

## 2018-01-12 HISTORY — PX: EXTERNAL FIXATION REMOVAL: SHX5040

## 2018-01-12 HISTORY — PX: ANKLE FUSION: SHX5718

## 2018-01-12 LAB — GLUCOSE, CAPILLARY
GLUCOSE-CAPILLARY: 138 mg/dL — AB (ref 70–99)
Glucose-Capillary: 133 mg/dL — ABNORMAL HIGH (ref 70–99)
Glucose-Capillary: 93 mg/dL (ref 70–99)
Glucose-Capillary: 132 mg/dL — ABNORMAL HIGH (ref 70–99)
Glucose-Capillary: 112 mg/dL — ABNORMAL HIGH (ref 70–99)
Glucose-Capillary: 285 mg/dL — ABNORMAL HIGH (ref 70–99)

## 2018-01-12 LAB — AEROBIC/ANAEROBIC CULTURE W GRAM STAIN (SURGICAL/DEEP WOUND)
Culture: NO GROWTH
Culture: NO GROWTH
Culture: NO GROWTH
Gram Stain: NONE SEEN
Gram Stain: NONE SEEN

## 2018-01-12 LAB — AEROBIC/ANAEROBIC CULTURE (SURGICAL/DEEP WOUND): GRAM STAIN: NONE SEEN

## 2018-01-12 SURGERY — ANKLE FUSION
Anesthesia: General | Site: Ankle | Laterality: Left

## 2018-01-12 MED ORDER — PHENYLEPHRINE HCL 10 MG/ML IJ SOLN
INTRAMUSCULAR | Status: AC
  Filled 2018-01-12: qty 1

## 2018-01-12 MED ORDER — FENTANYL CITRATE (PF) 100 MCG/2ML IJ SOLN
INTRAMUSCULAR | Status: DC | PRN
  Administered 2018-01-12 (×2): 25 ug via INTRAVENOUS

## 2018-01-12 MED ORDER — SODIUM CHLORIDE 0.9 % IV SOLN
INTRAVENOUS | Status: DC | PRN
  Administered 2018-01-12: 25 ug/min via INTRAVENOUS

## 2018-01-12 MED ORDER — FENTANYL CITRATE (PF) 100 MCG/2ML IJ SOLN
INTRAMUSCULAR | Status: AC
  Administered 2018-01-12: 50 ug via INTRAVENOUS
  Filled 2018-01-12: qty 2

## 2018-01-12 MED ORDER — CEFAZOLIN SODIUM-DEXTROSE 2-3 GM-%(50ML) IV SOLR
INTRAVENOUS | Status: DC | PRN
  Administered 2018-01-12: 2 g via INTRAVENOUS

## 2018-01-12 MED ORDER — MIDAZOLAM HCL 2 MG/2ML IJ SOLN
INTRAMUSCULAR | Status: AC
  Filled 2018-01-12: qty 2

## 2018-01-12 MED ORDER — BUPIVACAINE-EPINEPHRINE (PF) 0.5% -1:200000 IJ SOLN
INTRAMUSCULAR | Status: DC | PRN
  Administered 2018-01-12: 20 mL via PERINEURAL
  Administered 2018-01-12: 25 mL via PERINEURAL

## 2018-01-12 MED ORDER — CLONIDINE HCL (ANALGESIA) 100 MCG/ML EP SOLN
EPIDURAL | Status: DC | PRN
  Administered 2018-01-12 (×2): 50 ug

## 2018-01-12 MED ORDER — MIDAZOLAM HCL 2 MG/2ML IJ SOLN
INTRAMUSCULAR | Status: AC
  Administered 2018-01-12: 1 mg via INTRAVENOUS
  Filled 2018-01-12: qty 2

## 2018-01-12 MED ORDER — PHENOL 1.4 % MT LIQD
1.0000 | OROMUCOSAL | Status: DC | PRN
  Filled 2018-01-12: qty 177

## 2018-01-12 MED ORDER — PROPOFOL 10 MG/ML IV BOLUS
INTRAVENOUS | Status: DC | PRN
  Administered 2018-01-12: 150 mg via INTRAVENOUS

## 2018-01-12 MED ORDER — SODIUM CHLORIDE 0.9 % IR SOLN
Status: DC | PRN
  Administered 2018-01-12: 1000 mL

## 2018-01-12 MED ORDER — PROPOFOL 10 MG/ML IV BOLUS
INTRAVENOUS | Status: AC
  Filled 2018-01-12: qty 20

## 2018-01-12 MED ORDER — ONDANSETRON HCL 4 MG/2ML IJ SOLN
INTRAMUSCULAR | Status: AC
  Filled 2018-01-12: qty 2

## 2018-01-12 MED ORDER — MIDAZOLAM HCL 2 MG/2ML IJ SOLN
1.0000 mg | Freq: Once | INTRAMUSCULAR | Status: AC
  Administered 2018-01-12: 1 mg via INTRAVENOUS

## 2018-01-12 MED ORDER — CEFAZOLIN SODIUM-DEXTROSE 1-4 GM/50ML-% IV SOLN
1.0000 g | Freq: Three times a day (TID) | INTRAVENOUS | Status: AC
  Administered 2018-01-12 – 2018-01-13 (×3): 1 g via INTRAVENOUS
  Filled 2018-01-12 (×3): qty 50

## 2018-01-12 MED ORDER — LACTATED RINGERS IV SOLN
INTRAVENOUS | Status: DC
  Administered 2018-01-12: 15:00:00 via INTRAVENOUS

## 2018-01-12 MED ORDER — PHENYLEPHRINE 40 MCG/ML (10ML) SYRINGE FOR IV PUSH (FOR BLOOD PRESSURE SUPPORT)
PREFILLED_SYRINGE | INTRAVENOUS | Status: AC
  Filled 2018-01-12: qty 10

## 2018-01-12 MED ORDER — ONDANSETRON HCL 4 MG PO TABS: 4.0000 mg | ORAL_TABLET | Freq: Four times a day (QID) | ORAL | Status: DC | PRN

## 2018-01-12 MED ORDER — LACTATED RINGERS IV SOLN
INTRAVENOUS | Status: DC
  Administered 2018-01-12: 19:00:00 via INTRAVENOUS

## 2018-01-12 MED ORDER — ONDANSETRON HCL 4 MG/2ML IJ SOLN
INTRAMUSCULAR | Status: DC | PRN
  Administered 2018-01-12: 4 mg via INTRAVENOUS

## 2018-01-12 MED ORDER — CEFAZOLIN SODIUM 1 G IJ SOLR
INTRAMUSCULAR | Status: AC
  Filled 2018-01-12: qty 20

## 2018-01-12 MED ORDER — DOCUSATE SODIUM 100 MG PO CAPS
100.0000 mg | ORAL_CAPSULE | Freq: Two times a day (BID) | ORAL | Status: DC
  Administered 2018-01-12 – 2018-01-14 (×3): 100 mg via ORAL
  Filled 2018-01-12 (×4): qty 1

## 2018-01-12 MED ORDER — FENTANYL CITRATE (PF) 250 MCG/5ML IJ SOLN
INTRAMUSCULAR | Status: AC
  Filled 2018-01-12: qty 5

## 2018-01-12 MED ORDER — FENTANYL CITRATE (PF) 100 MCG/2ML IJ SOLN: 25.0000 ug | INTRAMUSCULAR | Status: DC | PRN

## 2018-01-12 MED ORDER — PHENYLEPHRINE 40 MCG/ML (10ML) SYRINGE FOR IV PUSH (FOR BLOOD PRESSURE SUPPORT)
PREFILLED_SYRINGE | INTRAVENOUS | Status: DC | PRN
  Administered 2018-01-12: 80 ug via INTRAVENOUS
  Administered 2018-01-12: 40 ug via INTRAVENOUS
  Administered 2018-01-12: 80 ug via INTRAVENOUS

## 2018-01-12 MED ORDER — FENTANYL CITRATE (PF) 100 MCG/2ML IJ SOLN
50.0000 ug | Freq: Once | INTRAMUSCULAR | Status: AC
  Administered 2018-01-12: 50 ug via INTRAVENOUS

## 2018-01-12 MED ORDER — ONDANSETRON HCL 4 MG/2ML IJ SOLN
4.0000 mg | Freq: Four times a day (QID) | INTRAMUSCULAR | Status: DC | PRN
  Administered 2018-01-12: 4 mg via INTRAVENOUS
  Filled 2018-01-12: qty 2

## 2018-01-12 SURGICAL SUPPLY — 76 items
BANDAGE ACE 4X5 VEL STRL LF (GAUZE/BANDAGES/DRESSINGS) ×1 IMPLANT
BANDAGE ACE 6X5 VEL STRL LF (GAUZE/BANDAGES/DRESSINGS) ×1 IMPLANT
BANDAGE ESMARK 6X9 LF (GAUZE/BANDAGES/DRESSINGS) IMPLANT
BIT DRILL CALIBRATED 4.3X320MM (BIT) IMPLANT
BIT DRILL CANN 7X200 (BIT) ×2 IMPLANT
BNDG CMPR 9X6 STRL LF SNTH (GAUZE/BANDAGES/DRESSINGS) ×1
BNDG COHESIVE 6X5 TAN STRL LF (GAUZE/BANDAGES/DRESSINGS) ×3 IMPLANT
BNDG ESMARK 6X9 LF (GAUZE/BANDAGES/DRESSINGS) ×3
BNDG GAUZE ELAST 4 BULKY (GAUZE/BANDAGES/DRESSINGS) ×2 IMPLANT
BUR ROUND FLUTED 4 SOFT TCH (BURR) ×1 IMPLANT
BUR ROUND FLUTED 4MM SOFT TCH (BURR) ×1
COTTON STERILE ROLL (GAUZE/BANDAGES/DRESSINGS) ×2 IMPLANT
COVER MAYO STAND STRL (DRAPES) ×3 IMPLANT
COVER SURGICAL LIGHT HANDLE (MISCELLANEOUS) ×3 IMPLANT
COVER WAND RF STERILE (DRAPES) ×3 IMPLANT
CUFF TOURNIQUET SINGLE 34IN LL (TOURNIQUET CUFF) ×2 IMPLANT
CUFF TOURNIQUET SINGLE 44IN (TOURNIQUET CUFF) IMPLANT
DRAPE C-ARM 42X72 X-RAY (DRAPES) ×2 IMPLANT
DRAPE C-ARMOR (DRAPES) ×2 IMPLANT
DRAPE HALF SHEET 40X57 (DRAPES) ×3 IMPLANT
DRAPE INCISE IOBAN 66X45 STRL (DRAPES) ×3 IMPLANT
DRAPE U-SHAPE 47X51 STRL (DRAPES) ×3 IMPLANT
DRILL CALIBRATED 4.3X320MM (BIT) ×3
DRSG ADAPTIC 3X8 NADH LF (GAUZE/BANDAGES/DRESSINGS) ×1 IMPLANT
DRSG PAD ABDOMINAL 8X10 ST (GAUZE/BANDAGES/DRESSINGS) ×5 IMPLANT
DURAPREP 26ML APPLICATOR (WOUND CARE) ×3 IMPLANT
ELECT REM PT RETURN 9FT ADLT (ELECTROSURGICAL) ×3
ELECTRODE REM PT RTRN 9FT ADLT (ELECTROSURGICAL) ×1 IMPLANT
GAUZE SPONGE 4X4 12PLY STRL (GAUZE/BANDAGES/DRESSINGS) ×3 IMPLANT
GAUZE XEROFORM 5X9 LF (GAUZE/BANDAGES/DRESSINGS) ×3 IMPLANT
GLOVE BIOGEL PI IND STRL 8 (GLOVE) ×2 IMPLANT
GLOVE BIOGEL PI INDICATOR 8 (GLOVE) ×6
GLOVE ORTHO TXT STRL SZ7.5 (GLOVE) ×6 IMPLANT
GOWN STRL REUS W/ TWL LRG LVL3 (GOWN DISPOSABLE) ×1 IMPLANT
GOWN STRL REUS W/ TWL XL LVL3 (GOWN DISPOSABLE) ×1 IMPLANT
GOWN STRL REUS W/TWL 2XL LVL3 (GOWN DISPOSABLE) ×3 IMPLANT
GOWN STRL REUS W/TWL LRG LVL3 (GOWN DISPOSABLE) ×3
GOWN STRL REUS W/TWL XL LVL3 (GOWN DISPOSABLE) ×3
GUIDEPIN 3.2X17.5 THRD DISP (PIN) ×2 IMPLANT
GUIDEWIRE 2.6X80 BEAD TIP (WIRE) IMPLANT
GUIDWIRE 2.6X80 BEAD TIP (WIRE) ×3
KIT BASIN OR (CUSTOM PROCEDURE TRAY) ×3 IMPLANT
KIT TURNOVER KIT B (KITS) ×3 IMPLANT
MANIFOLD NEPTUNE II (INSTRUMENTS) ×3 IMPLANT
NAIL LOCK ANKLE ANTE 11X180 (Nail) ×2 IMPLANT
NS IRRIG 1000ML POUR BTL (IV SOLUTION) ×3 IMPLANT
PACK ORTHO EXTREMITY (CUSTOM PROCEDURE TRAY) ×3 IMPLANT
PAD ARMBOARD 7.5X6 YLW CONV (MISCELLANEOUS) ×4 IMPLANT
PAD CAST 4YDX4 CTTN HI CHSV (CAST SUPPLIES) ×1 IMPLANT
PADDING CAST COTTON 4X4 STRL (CAST SUPPLIES)
PADDING CAST COTTON 6X4 STRL (CAST SUPPLIES) ×7 IMPLANT
PENCIL BUTTON HOLSTER BLD 10FT (ELECTRODE) IMPLANT
PUTTY DBM STAGRAFT PLUS 10CC (Putty) ×2 IMPLANT
SCREW CORT TI DBL LEAD 5X24 (Screw) ×2 IMPLANT
SCREW CORT TI DBL LEAD 5X26 (Screw) ×2 IMPLANT
SCREW CORT TI DBL LEAD 5X36 (Screw) ×4 IMPLANT
SCREW CORT TI DBL LEAD 5X65 (Screw) ×2 IMPLANT
SCREW CORT TI DBL LEAD 5X70 (Screw) IMPLANT
SPONGE LAP 18X18 X RAY DECT (DISPOSABLE) ×3 IMPLANT
STAPLER VISISTAT 35W (STAPLE) IMPLANT
STOCKINETTE IMPERVIOUS LG (DRAPES) ×1 IMPLANT
SUCTION FRAZIER HANDLE 10FR (MISCELLANEOUS) ×2
SUCTION TUBE FRAZIER 10FR DISP (MISCELLANEOUS) ×1 IMPLANT
SUT ETHILON 2 0 FS 18 (SUTURE) ×8 IMPLANT
SUT ETHILON 3 0 PS 1 (SUTURE) ×4 IMPLANT
SUT VIC AB 0 CT1 27 (SUTURE) ×3
SUT VIC AB 0 CT1 27XBRD ANBCTR (SUTURE) IMPLANT
SUT VIC AB 2-0 CT1 27 (SUTURE) ×6
SUT VIC AB 2-0 CT1 TAPERPNT 27 (SUTURE) ×2 IMPLANT
TOWEL OR 17X24 6PK STRL BLUE (TOWEL DISPOSABLE) ×6 IMPLANT
TOWEL OR 17X26 10 PK STRL BLUE (TOWEL DISPOSABLE) ×3 IMPLANT
TUBE CONNECTING 12'X1/4 (SUCTIONS) ×1
TUBE CONNECTING 12X1/4 (SUCTIONS) ×2 IMPLANT
UNDERPAD 30X30 (UNDERPADS AND DIAPERS) ×5 IMPLANT
WATER STERILE IRR 1000ML POUR (IV SOLUTION) ×2 IMPLANT
YANKAUER SUCT BULB TIP NO VENT (SUCTIONS) ×3 IMPLANT

## 2018-01-12 NOTE — Plan of Care (Signed)
  Problem: Education: Goal: Knowledge of General Education information will improve Description Including pain rating scale, medication(s)/side effects and non-pharmacologic comfort measures Outcome: Progressing   Problem: Clinical Measurements: Goal: Will remain free from infection Outcome: Progressing Goal: Diagnostic test results will improve Outcome: Progressing   Problem: Nutrition: Goal: Adequate nutrition will be maintained Outcome: Progressing   Problem: Elimination: Goal: Will not experience complications related to bowel motility Outcome: Progressing Note:  LBM 01/11/2018.  Goal: Will not experience complications related to urinary retention Outcome: Progressing   Problem: Pain Managment: Goal: General experience of comfort will improve Outcome: Progressing

## 2018-01-12 NOTE — Transfer of Care (Signed)
Immediate Anesthesia Transfer of Care Note  Patient: Jason Woods  Procedure(s) Performed: LEFT ANKLE REMOVAL OF EXTERNAL FIXATOR, ANKLE FUSION WITH [place] NAIL (Left Ankle) REMOVAL EXTERNAL FIXATION ANKLE (Left Ankle)  Patient Location: PACU  Anesthesia Type:General  Level of Consciousness: awake, alert , oriented and patient cooperative  Airway & Oxygen Therapy: Patient Spontanous Breathing and Patient connected to nasal cannula oxygen  Post-op Assessment: Report given to RN and Post -op Vital signs reviewed and stable  Post vital signs: Reviewed and stable  Last Vitals:  Vitals Value Taken Time  BP 133/67 01/12/2018  5:38 PM  Temp    Pulse 90 01/12/2018  5:39 PM  Resp 14 01/12/2018  5:39 PM  SpO2 100 % 01/12/2018  5:39 PM  Vitals shown include unvalidated device data.  Last Pain:  Vitals:   01/12/18 1500  TempSrc:   PainSc: 0-No pain      Patients Stated Pain Goal: 3 (53/64/68 0321)  Complications: No apparent anesthesia complications

## 2018-01-12 NOTE — Anesthesia Procedure Notes (Signed)
Anesthesia Regional Block: Popliteal block   Pre-Anesthetic Checklist: ,, timeout performed, Correct Patient, Correct Site, Correct Laterality, Correct Procedure, Correct Position, site marked, Risks and benefits discussed,  Surgical consent,  Pre-op evaluation,  At surgeon's request and post-op pain management  Laterality: Left  Prep: chloraprep       Needles:  Injection technique: Single-shot  Needle Type: Echogenic Needle     Needle Length: 9cm  Needle Gauge: 21     Additional Needles:   Procedures:,,,, ultrasound used (permanent image in chart),,,,  Narrative:  Start time: 01/12/2018 2:35 PM End time: 01/12/2018 2:43 PM Injection made incrementally with aspirations every 5 mL.  Performed by: Personally  Anesthesiologist: Suzette Battiest, MD

## 2018-01-12 NOTE — Anesthesia Procedure Notes (Signed)
Anesthesia Regional Block: Adductor canal block   Pre-Anesthetic Checklist: ,, timeout performed, Correct Patient, Correct Site, Correct Laterality, Correct Procedure, Correct Position, site marked, Risks and benefits discussed,  Surgical consent,  Pre-op evaluation,  At surgeon's request and post-op pain management  Laterality: Left  Prep: chloraprep       Needles:  Injection technique: Single-shot  Needle Type: Echogenic Needle     Needle Length: 9cm  Needle Gauge: 21     Additional Needles:   Procedures:,,,, ultrasound used (permanent image in chart),,,,  Narrative:  Start time: 01/12/2018 2:43 PM End time: 01/12/2018 2:47 PM Injection made incrementally with aspirations every 5 mL.  Performed by: Personally  Anesthesiologist: Suzette Battiest, MD

## 2018-01-12 NOTE — Anesthesia Preprocedure Evaluation (Addendum)
Anesthesia Evaluation  Patient identified by MRN, date of birth, ID band Patient awake    Reviewed: Allergy & Precautions, NPO status , Patient's Chart, lab work & pertinent test results  Airway Mallampati: II  TM Distance: >3 FB Neck ROM: Full    Dental  (+) Dental Advisory Given   Pulmonary Current Smoker,    breath sounds clear to auscultation       Cardiovascular hypertension, Pt. on medications  Rhythm:Regular Rate:Normal     Neuro/Psych PSYCHIATRIC DISORDERS Anxiety Depression negative neurological ROS     GI/Hepatic Neg liver ROS, GERD  ,  Endo/Other  diabetes, Type 2  Renal/GU      Musculoskeletal  (+) Arthritis , Osteoarthritis,    Abdominal   Peds  Hematology   Anesthesia Other Findings   Reproductive/Obstetrics                            Lab Results  Component Value Date   WBC 8.1 01/11/2018   HGB 11.0 (L) 01/11/2018   HCT 34.1 (L) 01/11/2018   MCV 80.2 01/11/2018   PLT 365 01/11/2018   Lab Results  Component Value Date   CREATININE 0.72 01/11/2018   BUN 10 01/11/2018   NA 129 (L) 01/11/2018   K 3.5 01/11/2018   CL 89 (L) 01/11/2018   CO2 30 01/11/2018    Anesthesia Physical Anesthesia Plan  ASA: III  Anesthesia Plan: General   Post-op Pain Management: GA combined w/ Regional for post-op pain   Induction: Intravenous  PONV Risk Score and Plan: 1 and Dexamethasone, Ondansetron and Treatment may vary due to age or medical condition  Airway Management Planned: LMA  Additional Equipment:   Intra-op Plan:   Post-operative Plan: Extubation in OR  Informed Consent: I have reviewed the patients History and Physical, chart, labs and discussed the procedure including the risks, benefits and alternatives for the proposed anesthesia with the patient or authorized representative who has indicated his/her understanding and acceptance.   Dental advisory  given  Plan Discussed with: CRNA  Anesthesia Plan Comments:         Anesthesia Quick Evaluation

## 2018-01-12 NOTE — Op Note (Addendum)
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  Left ankle fusion with Phoenix calcaneal fibular nail 11 x 180 mm with proximal and distal interlocks.  Surgeon: Rodell Perna MD  Assistant: Benjiman Core, PA-C medically necessary and present until the end of the case when I performed closure.  Tourniquet time 30minutes  Brief history 72 year old male with open trimalleolar ankle fracture to his left ankle 3 months ago.  He is treated with medial lateral fixation and syndesmotic screw fixation with good position and alignment.  Syndesmotic screw was removed at 10 weeks he began progressive weightbearing and had pullout of the screws distally in the fibula and posterior medial displacement of the medial malleolus with ankle subluxation consistent with nonunion.  Patient is a diabetic, smoker has peripheral arterial disease and is on chronic Subutex for pain.  5 days ago hardware was removed cultures were obtained there is no evidence of infection joint fluid was clear in all cultures now are final and all were negative for infection.  Procedure after induction of preoperative block general anesthesia proximal thigh tourniquet using the bone foam, timeout procedure was completed in the dressings were removed leg was scrubbed including the external fixator with a scrub brush and then external fixator was removed.  Large pin cutter was used to cut the pin adjacent to the skin was scrubbed vigorously and then removed using a hand shot for the lateral side of the calcaneus.  2 tibial pins were removed all pin tract sites were scrubbed and all look cleaned.  There are medial lateral incisions from the old removal of the fibular plate which was closed with nylon in the medial side which is closed with nylon in both looked pristine without drainage.  Leg was then scrubbed again dried off  with a sterile towel and then DuraPrep was used.  Extremity sheets and drapes were applied timeout procedure was completed again.  Medial incision had sutures removed which was a long Z shaped incision from his old transverse original laceration from his open fracture.  Inspection showed no evidence of infection and patient had multiple antibiotic beads had been placed by absorbable and these were removed.  Using a laminar spreader distraction across the ankle 4 mm bur was used to decorticate the cartilage surface off of both the distal tibia as well as the tibia including the lateral fibula and lateral gutter.  Bone is debrided down to bleeding subchondral bone and then using a K wire multiple drill holes were made in the talus and in the tibia.  Some portions of the medial malleolus was debrided for bone graft and packed anteriorly.  Next with the sterilely draped C-arm brought in the dura was drilled up through the calcaneus under direct C-arm visualization AP and lateral up to the up through the talus and into the tibia.  Starter reamer progressive reaming selection of the above-mentioned Phoenix nail.  Nail was inserted distal interlocks were placed first with the tip of nail right at the cortex of the calcaneus.  Longitudinal incision was made on the plantar surface of blunt spreading to the plantar fascia down directly to bone to preserve the plantar neurovascular branches.  Nail was then inserted into screws were placed.  Attempt made to place the posters screw but the Jegede gotten loose after the 2 proximal screws had been placed with tightening and compression across the ankle joint and with the  jig loose the screw did not catch the whole in the rod.  The screw from posterior to anterior was left out.  There would do but to bicortical good purchase screws distally with the talar screw placed from lateral to medial.  2 bicortical screws were placed in the tibia which were 2426 mm and gave good bicortical  purchase there was good stability.  A mixture slurry of cortical cancellus graft was mixed with the patient's own bone that had been obtained and chopped into small little pieces mixed together and then was packed in over the anterior aspect of the compressed ankle joint.  Fluoroscopic pictures showed good compression across the ankle joint satisfactory position and alignment of the ankle fusion.  Patient tolerated the procedure well and tourniquet was deflated prior to closure copious irrigation closure of the skin with 2-0 nylon with far near near far sutures in the medial incision.  Pin track original external fixator sites were left open and Xeroform 4 x 4's web roll ABDs more web roll and then a large thick cotton roll dressing with Coban was applied.

## 2018-01-12 NOTE — Interval H&P Note (Signed)
History and Physical Interval Note:  01/12/2018 2:38 PM  [place]  has presented today for surgery, with the diagnosis of LEFT TRIMALLEOLAR NONUNION WITH EXTERNAL FIXATOR  The various methods of treatment have been discussed with the patient and family. After consideration of risks, benefits and other options for treatment, the patient has consented to  Procedure(s): LEFT ANKLE REMOVAL OF EXTERNAL FIXATOR, ANKLE FUSION WITH PHOENIX NAIL (Left) REMOVAL EXTERNAL FIXATION ANKLE (Left) as a surgical intervention .  The patient's history has been reviewed, patient examined, no change in status, stable for surgery.  I have reviewed the patient's chart and labs.  Questions were answered to the patient's satisfaction.     Jason Woods

## 2018-01-12 NOTE — Progress Notes (Addendum)
NPO maint post midnight except for sips of water w/ meds this morning. CHG completed. Consent in the chart. 1358 Pt to short stay with daughter present. Report was given to [facility]. 1815 Received pt back from PACU, A&O x4.  Bulky dressing to left ankle to foot dry and intact. Toes are cool to touch and numbness noted. LLE elevated to pillows.

## 2018-01-12 NOTE — Anesthesia Procedure Notes (Signed)
Procedure Name: LMA Insertion Date/Time: 01/12/2018 3:13 PM Performed by: Raenette Rover, CRNA Pre-anesthesia Checklist: Patient identified, Emergency Drugs available, Suction available and Patient being monitored Patient Re-evaluated:Patient Re-evaluated prior to induction Oxygen Delivery Method: Circle system utilized Preoxygenation: Pre-oxygenation with 100% oxygen Induction Type: IV induction LMA: LMA inserted LMA Size: 4.0 Number of attempts: 1 Placement Confirmation: positive ETCO2,  CO2 detector and breath sounds checked- equal and bilateral Tube secured with: Tape Dental Injury: Teeth and Oropharynx as per pre-operative assessment

## 2018-01-12 NOTE — Progress Notes (Signed)
Physical Therapy Treatment Patient Details Name: Jason Woods MRN: [id number] DOB: 1945-10-16 Today's Date: 01/12/2018    History of Present Illness Pt is a 72 y.o. male with PMH of HTN, DM, and legally blind. Pt fell at home July 2019 sustaining L ankle fx. He underwent ORIF 10/05/17. This repair failed as xrays revealed hardware movement and nonunion. He underwent hardware removal and external fixation 01/07/18. Awaiting ankle fusion surgery.     PT Comments    Pt awaiting L ankle fusion surgery this afternoon, but agreeable to get to recliner and work on UE strengthening and practicing sit<>stand with NWB on L LE. Pt inquiring about possibility of going home vs SNF will reassess d/c plans after surgery.   Follow Up Recommendations  SNF     Equipment Recommendations  Other (comment)(elevating leg rests for w/c, will need new w/c if unable to get new legrests for his current w/c)    Recommendations for Other Services       Precautions / Restrictions Precautions Precautions: Fall;Other (comment) Precaution Comments: ext fix L ankle Restrictions Weight Bearing Restrictions: Yes LLE Weight Bearing: Non weight bearing    Mobility  Bed Mobility Overal bed mobility: Needs Assistance Bed Mobility: Supine to Sit     Supine to sit: Supervision;HOB elevated     General bed mobility comments: supervision for safety, no physical assist  Transfers Overall transfer level: Needs assistance Equipment used: Rolling walker (2 wheeled) Transfers: Sit to/from [facility] Sit to Stand: Min guard Stand pivot transfers: Min guard       General transfer comment: min guard for safety with sit>stand and stand pivot to recliner  Ambulation/Gait             General Gait Details: did not attempt due to pain in dependent position after using BSC       Balance Overall balance assessment: Needs assistance Sitting-balance support: No upper extremity  supported;Feet supported Sitting balance-Leahy Scale: Normal     Standing balance support: Bilateral upper extremity supported;During functional activity Standing balance-Leahy Scale: Poor Standing balance comment: reliant on RW due to NWB LLE                            Cognition Arousal/Alertness: Awake/alert Behavior During Therapy: WFL for tasks assessed/performed Overall Cognitive Status: Within Functional Limits for tasks assessed                                        Exercises General Exercises - Upper Extremity Chair Push Up: AROM;10 reps;Seated Other Exercises Other Exercises: 10x sit<>stand    General Comments General comments (skin integrity, edema, etc.): discussed possibility of going home after surgery      Pertinent Vitals/Pain Pain Assessment: 0-10 Pain Score: 4  Pain Location: L ankle Pain Descriptors / Indicators: Sore Pain Intervention(s): Limited activity within patient's tolerance;Monitored during session;Repositioned           PT Goals (current goals can now be found in the care plan section) Acute Rehab PT Goals Patient Stated Goal: go to rehab for a while  PT Goal Formulation: With patient Time For Goal Achievement: 01/22/18 Potential to Achieve Goals: Good Progress towards PT goals: Progressing toward goals    Frequency    Min 3X/week      PT Plan Current plan remains appropriate  AM-PAC PT "6 Clicks" Daily Activity  Outcome Measure  Difficulty turning over in bed (including adjusting bedclothes, sheets and blankets)?: A Little Difficulty moving from lying on back to sitting on the side of the bed? : A Little Difficulty sitting down on and standing up from a chair with arms (e.g., wheelchair, bedside commode, etc,.)?: A Little Help needed moving to and from a bed to chair (including a wheelchair)?: A Little Help needed walking in hospital room?: A Little Help needed climbing 3-5 steps with a  railing? : A Lot 6 Click Score: 17    End of Session Equipment Utilized During Treatment: Gait belt Activity Tolerance: Patient tolerated treatment well Patient left: in chair;with call bell/phone within reach Nurse Communication: Mobility status PT Visit Diagnosis: Other abnormalities of gait and mobility (R26.89);Difficulty in walking, not elsewhere classified (R26.2);Pain Pain - Right/Left: Left Pain - part of body: Ankle and joints of foot     Time: 9507-2257 PT Time Calculation (min) (ACUTE ONLY): 18 min  Charges:  $Therapeutic Exercise: 8-22 mins                     Alix Lahmann B. Migdalia Dk PT, DPT Acute Rehabilitation Services Pager [phone] Office [phone]    [place] 01/12/2018, 11:04 AM

## 2018-01-13 ENCOUNTER — Inpatient Hospital Stay (INDEPENDENT_AMBULATORY_CARE_PROVIDER_SITE_OTHER): Payer: Medicare Other | Admitting: Orthopaedic Surgery

## 2018-01-13 ENCOUNTER — Encounter (HOSPITAL_COMMUNITY): Payer: Self-pay | Admitting: Orthopaedic Surgery

## 2018-01-13 LAB — BASIC METABOLIC PANEL
ANION GAP: 7 (ref 5–15)
BUN: 11 mg/dL (ref 8–23)
CALCIUM: 8.5 mg/dL — AB (ref 8.9–10.3)
CO2: 29 mmol/L (ref 22–32)
Chloride: 92 mmol/L — ABNORMAL LOW (ref 98–111)
Creatinine, Ser: 0.79 mg/dL (ref 0.61–1.24)
Glucose, Bld: 189 mg/dL — ABNORMAL HIGH (ref 70–99)
Potassium: 3.5 mmol/L (ref 3.5–5.1)
SODIUM: 128 mmol/L — AB (ref 135–145)

## 2018-01-13 LAB — GLUCOSE, CAPILLARY
GLUCOSE-CAPILLARY: 195 mg/dL — AB (ref 70–99)
GLUCOSE-CAPILLARY: 254 mg/dL — AB (ref 70–99)
Glucose-Capillary: 168 mg/dL — ABNORMAL HIGH (ref 70–99)
Glucose-Capillary: 192 mg/dL — ABNORMAL HIGH (ref 70–99)

## 2018-01-13 MED ORDER — INSULIN GLARGINE 100 UNIT/ML ~~LOC~~ SOLN
15.0000 [IU] | Freq: Every day | SUBCUTANEOUS | Status: DC
  Administered 2018-01-13: 15 [IU] via SUBCUTANEOUS
  Filled 2018-01-13 (×2): qty 0.15

## 2018-01-13 NOTE — Plan of Care (Signed)

## 2018-01-13 NOTE — Progress Notes (Signed)
Physical Therapy Treatment Patient Details Name: Jason Woods MRN: [id number] DOB: October 25, 1945 Today's Date: 01/13/2018    History of Present Illness Pt is a 72 y.o. male with PMH of HTN, DM, and legally blind. Pt fell at home July 2019 sustaining L ankle fx. He underwent ORIF 10/05/17. This repair failed as xrays revealed hardware movement and nonunion. He underwent hardware removal and external fixation 01/07/18. s/p ankle fusion 10/23    PT Comments    Pt currently requesting to go home with HHPT, which will be feasible if he is able to arrange 24 hour assistance at home. Pt is currently min guard for transfers and minA for steadying with RW to ambulate 18 feet, in addition to mobility pt will requires assist for set up. D/c plan needs to be updated once pt can arrange for 24/hour family supervision.    Follow Up Recommendations  Home health PT;Supervision/Assistance - 24 hour     Equipment Recommendations  Other (comment)(elevating leg rests for w/c, will need new w/c if unable to get new legrests for his current w/c)    Recommendations for Other Services       Precautions / Restrictions Precautions Precautions: Fall;Other (comment) Precaution Comments: ext fix L ankle Restrictions Weight Bearing Restrictions: Yes LLE Weight Bearing: Non weight bearing    Mobility  Bed Mobility          General bed mobility comments: OOB in recliner on entry  Transfers Overall transfer level: Needs assistance Equipment used: Rolling walker (2 wheeled) Transfers: Sit to/from [facility] Sit to Stand: Min guard         General transfer comment: min guard for safety with sit>stand, vc to get hips closer to the edge of the recliner prior to power up   Ambulation/Gait Ambulation/Gait assistance: Min assist Gait Distance (Feet): 18 Feet Assistive device: Rolling walker (2 wheeled) Gait Pattern/deviations: Step-to pattern Gait velocity: decreased Gait  velocity interpretation: <1.31 ft/sec, indicative of household ambulator General Gait Details: min A for steadying, pt able to maintain NWB on LLE, however had 2x L LE foot drag due to increased thickness of ankle dressing         Balance Overall balance assessment: Needs assistance Sitting-balance support: No upper extremity supported;Feet supported Sitting balance-Leahy Scale: Normal     Standing balance support: Bilateral upper extremity supported;During functional activity Standing balance-Leahy Scale: Poor Standing balance comment: reliant on RW due to NWB LLE                            Cognition Arousal/Alertness: Awake/alert Behavior During Therapy: WFL for tasks assessed/performed Overall Cognitive Status: Within Functional Limits for tasks assessed                                           General Comments General comments (skin integrity, edema, etc.): VSS,       Pertinent Vitals/Pain Pain Assessment: 0-10 Pain Score: 4  Pain Location: L ankle Pain Descriptors / Indicators: Sore Pain Intervention(s): Limited activity within patient's tolerance;Monitored during session;Repositioned;Premedicated before session           PT Goals (current goals can now be found in the care plan section) Acute Rehab PT Goals Patient Stated Goal: go to rehab for a while  PT Goal Formulation: With patient Time For Goal Achievement: 01/22/18 Potential to  Achieve Goals: Good    Frequency    Min 3X/week      PT Plan Discharge plan needs to be updated       AM-PAC PT "6 Clicks" Daily Activity  Outcome Measure  Difficulty turning over in bed (including adjusting bedclothes, sheets and blankets)?: A Little Difficulty moving from lying on back to sitting on the side of the bed? : A Little Difficulty sitting down on and standing up from a chair with arms (e.g., wheelchair, bedside commode, etc,.)?: Unable Help needed moving to and from a bed to  chair (including a wheelchair)?: A Little Help needed walking in hospital room?: A Little Help needed climbing 3-5 steps with a railing? : A Lot 6 Click Score: 15    End of Session Equipment Utilized During Treatment: Gait belt Activity Tolerance: Patient tolerated treatment well Patient left: in chair;with call bell/phone within reach Nurse Communication: Mobility status PT Visit Diagnosis: Other abnormalities of gait and mobility (R26.89);Difficulty in walking, not elsewhere classified (R26.2);Pain Pain - Right/Left: Left Pain - part of body: Ankle and joints of foot     Time: 3212-2482 PT Time Calculation (min) (ACUTE ONLY): 24 min  Charges:  $Gait Training: 8-22 mins $Therapeutic Exercise: 8-22 mins                     Rudolf Blizard B. Migdalia Dk PT, DPT Acute Rehabilitation Services Pager [phone] Office [phone]    [place] 01/13/2018, 1:01 PM

## 2018-01-13 NOTE — Plan of Care (Signed)
  Problem: Education: Goal: Knowledge of General Education information will improve Description Including pain rating scale, medication(s)/side effects and non-pharmacologic comfort measures Outcome: Progressing   Problem: Clinical Measurements: Goal: Ability to maintain clinical measurements within normal limits will improve Outcome: Progressing Goal: Will remain free from infection Outcome: Progressing Note:  Pt receiving IV antibiotics.    Problem: Nutrition: Goal: Adequate nutrition will be maintained Outcome: Progressing   Problem: Elimination: Goal: Will not experience complications related to bowel motility Outcome: Progressing Goal: Will not experience complications related to urinary retention Outcome: Progressing   Problem: Pain Managment: Goal: General experience of comfort will improve Outcome: Progressing   Problem: Safety: Goal: Ability to remain free from injury will improve Outcome: Progressing Note:  Pt remains free of injury. Bed locked and in lowest position. Call bell within reach. Urinal at bedside.

## 2018-01-13 NOTE — Anesthesia Postprocedure Evaluation (Signed)
Anesthesia Post Note  Patient: Jason Woods  Procedure(s) Performed: LEFT ANKLE REMOVAL OF EXTERNAL FIXATOR, ANKLE FUSION WITH PHOENIX NAIL (Left Ankle) REMOVAL EXTERNAL FIXATION ANKLE (Left Ankle)     Patient location during evaluation: PACU Anesthesia Type: General Level of consciousness: awake and alert Pain management: pain level controlled Vital Signs Assessment: post-procedure vital signs reviewed and stable Respiratory status: spontaneous breathing, nonlabored ventilation, respiratory function stable and patient connected to nasal cannula oxygen Cardiovascular status: blood pressure returned to baseline and stable Postop Assessment: no apparent nausea or vomiting Anesthetic complications: no    Last Vitals:  Vitals:   01/13/18 0409 01/13/18 0833  BP: (!) 106/51 (!) 121/49  Pulse: 68 72  Resp: 15 16  Temp: 36.6 C 36.7 C  SpO2: 94% 98%    Last Pain:  Vitals:   01/13/18 0833  TempSrc: Oral  PainSc: 4                  Tiajuana Amass

## 2018-01-13 NOTE — Progress Notes (Signed)
   Subjective: 1 Day Post-Op Procedure(s) (LRB): LEFT ANKLE REMOVAL OF EXTERNAL FIXATOR, ANKLE FUSION WITH PHOENIX NAIL (Left) REMOVAL EXTERNAL FIXATION ANKLE (Left) Patient reports pain as mild and moderate.    Objective: Vital signs in last 24 hours: Temp:  [97.7 F (36.5 C)-98.2 F (36.8 C)] 97.9 F (36.6 C) (10/24 0409) Pulse Rate:  [68-85] 68 (10/24 0409) Resp:  [10-20] 15 (10/24 0409) BP: (105-149)/(50-69) 106/51 (10/24 0409) SpO2:  [93 %-100 %] 94 % (10/24 0409) Weight:  [73 kg] 73 kg (10/23 1432)  Intake/Output from previous day: 10/23 0701 - 10/24 0700 In: 1206.6 [P.O.:180; I.V.:899.8; IV Piggyback:126.9] Out: 850 [Urine:600; Blood:250] Intake/Output this shift: No intake/output data recorded.  Recent Labs    01/11/18 0552  HGB 11.0*   Recent Labs    01/11/18 0552  WBC 8.1  RBC 4.25  HCT 34.1*  PLT 365   Recent Labs    01/11/18 0552 01/13/18 0333  NA 129* 128*  K 3.5 3.5  CL 89* 92*  CO2 30 29  BUN 10 11  CREATININE 0.72 0.79  GLUCOSE 125* 189*  CALCIUM 8.8* 8.5*   No results for input(s): LABPT, INR in the last 72 hours.  dressing intact .  Dg Ankle 2 Views Left  Result Date: 01/12/2018 CLINICAL DATA:  Left ankle removal of external fixator with fusion and Phoenix nail. EXAM: LEFT ANKLE - 2 VIEW; DG C-ARM 61-120 MIN COMPARISON:  01/07/2018 FINDINGS: Placement of intramedullary nail with associated screws through the tibia, talus and calcaneus intact. Interval removal of external fixation hardware. Remainder of the exam is unchanged. IMPRESSION: Removal external fixation hardware and placement of intramedullary nail bridging the tibia, talus and calcaneus with associated screws intact. Electronically Signed   By: Marin Olp M.D.   On: 01/12/2018 17:34   Dg C-arm 1-60 Min  Result Date: 01/12/2018 CLINICAL DATA:  Left ankle removal of external fixator with fusion and Phoenix nail. EXAM: LEFT ANKLE - 2 VIEW; DG C-ARM 61-120 MIN COMPARISON:   01/07/2018 FINDINGS: Placement of intramedullary nail with associated screws through the tibia, talus and calcaneus intact. Interval removal of external fixation hardware. Remainder of the exam is unchanged. IMPRESSION: Removal external fixation hardware and placement of intramedullary nail bridging the tibia, talus and calcaneus with associated screws intact. Electronically Signed   By: Marin Olp M.D.   On: 01/12/2018 17:34   Dg C-arm 1-60 Min  Result Date: 01/12/2018 CLINICAL DATA:  Left ankle removal of external fixator with fusion and Phoenix nail. EXAM: LEFT ANKLE - 2 VIEW; DG C-ARM 61-120 MIN COMPARISON:  01/07/2018 FINDINGS: Placement of intramedullary nail with associated screws through the tibia, talus and calcaneus intact. Interval removal of external fixation hardware. Remainder of the exam is unchanged. IMPRESSION: Removal external fixation hardware and placement of intramedullary nail bridging the tibia, talus and calcaneus with associated screws intact. Electronically Signed   By: Marin Olp M.D.   On: 01/12/2018 17:34    Assessment/Plan: 1 Day Post-Op Procedure(s) (LRB): LEFT ANKLE REMOVAL OF EXTERNAL FIXATOR, ANKLE FUSION WITH PHOENIX NAIL (Left) REMOVAL EXTERNAL FIXATION ANKLE (Left) Up with therapy.    Home vs SNF . Needs FL2  [name] 01/13/2018, 8:04 AM

## 2018-01-13 NOTE — Progress Notes (Signed)
Patient said the MD told him he could go home tomorrow or stay until Saturday.  Patient has decided he would like to be discharged tomorrow morning if possible.  He stated that he has help at home and feels ready.  I told him it was ultimately up th the doctor to decide his discharge time.

## 2018-01-14 LAB — BASIC METABOLIC PANEL
ANION GAP: 9 (ref 5–15)
BUN: 8 mg/dL (ref 8–23)
CO2: 31 mmol/L (ref 22–32)
Calcium: 8.3 mg/dL — ABNORMAL LOW (ref 8.9–10.3)
Chloride: 91 mmol/L — ABNORMAL LOW (ref 98–111)
Creatinine, Ser: 0.78 mg/dL (ref 0.61–1.24)
GLUCOSE: 125 mg/dL — AB (ref 70–99)
POTASSIUM: 3.5 mmol/L (ref 3.5–5.1)
Sodium: 131 mmol/L — ABNORMAL LOW (ref 135–145)

## 2018-01-14 LAB — GLUCOSE, CAPILLARY: GLUCOSE-CAPILLARY: 138 mg/dL — AB (ref 70–99)

## 2018-01-14 MED ORDER — NICOTINE 14 MG/24HR TD PT24: 14.0000 mg | MEDICATED_PATCH | TRANSDERMAL | 0 refills | Status: DC

## 2018-01-14 MED ORDER — OXYCODONE-ACETAMINOPHEN 5-325 MG PO TABS: 1.0000 | ORAL_TABLET | ORAL | 0 refills | Status: DC | PRN

## 2018-01-14 NOTE — Progress Notes (Signed)
Physical Therapy Treatment Patient Details Name: SUSAN ARANA MRN: [id number] DOB: 01-19-1946 Today's Date: 01/14/2018    History of Present Illness Pt is a 72 y.o. male with PMH of HTN, DM, and legally blind. Pt fell at home July 2019 sustaining L ankle fx. He underwent ORIF 10/05/17. This repair failed as xrays revealed hardware movement and nonunion. He underwent hardware removal and external fixation 01/07/18. s/p ankle fusion 10/23    PT Comments    Patient seen for mobility progression. Pt is making progress toward PT goals and tolerated increased activity. Pt is unsteady at times with gait and with some impulsive movements and will need 24 hour supervision/assistance for safety with OOB mobility.  Pt is able to maintain NWB status with mobility. Continue to progress as tolerated.   Follow Up Recommendations  Home health PT;Supervision/Assistance - 24 hour     Equipment Recommendations  Other (comment)(extended elevating leg rests for w/c)    Recommendations for Other Services       Precautions / Restrictions Precautions Precautions: Fall Restrictions Weight Bearing Restrictions: Yes LLE Weight Bearing: Non weight bearing    Mobility  Bed Mobility Overal bed mobility: Needs Assistance Bed Mobility: Supine to Sit     Supine to sit: Supervision     General bed mobility comments: supervision for safety; cues for slowing down and making safe movements with AD  Transfers Overall transfer level: Needs assistance Equipment used: Rolling walker (2 wheeled) Transfers: Sit to/from [facility] Sit to Stand: Min guard         General transfer comment: min guard for safety  Ambulation/Gait Ambulation/Gait assistance: Min assist;Min guard Gait Distance (Feet): 40 Feet Assistive device: Rolling walker (2 wheeled) Gait Pattern/deviations: Step-to pattern Gait velocity: decreased   General Gait Details: grossly min guard for safety and min A times  for balance; LOB when attempting to backup; cues for safety as pt tends to make fast movements with RW especially when turning    Stairs             Wheelchair Mobility    Modified Rankin (Stroke Patients Only)       Balance Overall balance assessment: Needs assistance Sitting-balance support: No upper extremity supported;Feet supported Sitting balance-Leahy Scale: Normal     Standing balance support: Bilateral upper extremity supported;During functional activity Standing balance-Leahy Scale: Poor Standing balance comment: reliant on RW due to NWB LLE                            Cognition Arousal/Alertness: Awake/alert Behavior During Therapy: WFL for tasks assessed/performed Overall Cognitive Status: Within Functional Limits for tasks assessed                                        Exercises General Exercises - Upper Extremity Chair Push Up: 10 reps General Exercises - Lower Extremity Straight Leg Raises: AROM;Left;5 reps    General Comments        Pertinent Vitals/Pain Pain Assessment: Faces Faces Pain Scale: Hurts a little bit Pain Location: L ankle Pain Descriptors / Indicators: Sore Pain Intervention(s): Monitored during session;Repositioned    Home Living                      Prior Function            PT Goals (current  goals can now be found in the care plan section) Acute Rehab PT Goals PT Goal Formulation: With patient Time For Goal Achievement: 01/22/18 Potential to Achieve Goals: Good Progress towards PT goals: Progressing toward goals    Frequency    Min 3X/week      PT Plan Current plan remains appropriate    Co-evaluation              AM-PAC PT "6 Clicks" Daily Activity  Outcome Measure  Difficulty turning over in bed (including adjusting bedclothes, sheets and blankets)?: A Little Difficulty moving from lying on back to sitting on the side of the bed? : A Little Difficulty sitting  down on and standing up from a chair with arms (e.g., wheelchair, bedside commode, etc,.)?: Unable Help needed moving to and from a bed to chair (including a wheelchair)?: A Little Help needed walking in hospital room?: A Little Help needed climbing 3-5 steps with a railing? : A Lot 6 Click Score: 15    End of Session Equipment Utilized During Treatment: Gait belt Activity Tolerance: Patient tolerated treatment well Patient left: in chair;with call bell/phone within reach Nurse Communication: Mobility status PT Visit Diagnosis: Other abnormalities of gait and mobility (R26.89);Difficulty in walking, not elsewhere classified (R26.2);Pain Pain - Right/Left: Left Pain - part of body: Ankle and joints of foot     Time: 1207-1230 PT Time Calculation (min) (ACUTE ONLY): 23 min  Charges:  $Gait Training: 8-22 mins $Therapeutic Activity: 8-22 mins                     Earney Navy, PTA Acute Rehabilitation Services Pager: [phone] Office: [phone]     Darliss Cheney 01/14/2018, 1:19 PM

## 2018-01-14 NOTE — Progress Notes (Signed)
   Subjective: 2 Days Post-Op Procedure(s) (LRB): LEFT ANKLE REMOVAL OF EXTERNAL FIXATOR, ANKLE FUSION WITH [place] NAIL (Left) REMOVAL EXTERNAL FIXATION ANKLE (Left) Patient reports pain as mild.    Objective: Vital signs in last 24 hours: Temp:  [98.1 F (36.7 C)-98.5 F (36.9 C)] 98.2 F (36.8 C) (10/25 0452) Pulse Rate:  [72-79] 77 (10/25 0452) Resp:  [15-16] 15 (10/25 0452) BP: (121-139)/(49-73) 127/68 (10/25 0452) SpO2:  [97 %-99 %] 97 % (10/25 0452)  Intake/Output from previous day: 10/24 0701 - 10/25 0700 In: 1920 [P.O.:1920] Out: 1925 [Urine:1925] Intake/Output this shift: No intake/output data recorded.  No results for input(s): HGB in the last 72 hours. No results for input(s): WBC, RBC, HCT, PLT in the last 72 hours. Recent Labs    01/13/18 0333 01/14/18 0412  NA 128* 131*  K 3.5 3.5  CL 92* 91*  CO2 29 31  BUN 11 8  CREATININE 0.79 0.78  GLUCOSE 189* 125*  CALCIUM 8.5* 8.3*   No results for input(s): LABPT, INR in the last 72 hours.  Neurologically intact No results found.  Assessment/Plan: 2 Days Post-Op Procedure(s) (LRB): LEFT ANKLE REMOVAL OF EXTERNAL FIXATOR, ANKLE FUSION WITH [place] NAIL (Left) REMOVAL EXTERNAL FIXATION ANKLE (Left) Up with therapy, discharge home with HHPT  Jason Woods 01/14/2018, 8:07 AM

## 2018-01-14 NOTE — Evaluation (Signed)
Occupational Therapy Evaluation Patient Details Name: Jason Woods MRN: [id number] DOB: May 29, 1945 Today's Date: 01/14/2018    History of Present Illness Pt is a 72 y.o. male with PMH of HTN, DM, and legally blind. Pt fell at home July 2019 sustaining L ankle fx. He underwent ORIF 10/05/17. This repair failed as xrays revealed hardware movement and nonunion. He underwent hardware removal and external fixation 01/07/18. s/p ankle fusion 10/23   Clinical Impression   PTA Pt has been functioning at wc level with RW to assist with transfers, Pt has appropriate DME for ADL - and has scheduled that his daughter will be around 24/7 initially to assist with ADL/IADL as needed. Pt is able to demonstrate transfers at min guard - slightly impulsive - but suspect that is largely due to eagerness to please. Educated in safety in shower/bathing/transfers and compensatory strategies for ADL/grooming tasks. Pt verbalized understanding and had no further questions at the end of session. A s Pt is set to dc today home OT will sign off acutely at this time. Thank you for the opportunity to serve this patient.     Follow Up Recommendations  Supervision/Assistance - 24 hour    Equipment Recommendations  None recommended by OT(Pt has appropriate DME)    Recommendations for Other Services       Precautions / Restrictions Precautions Precautions: Fall Restrictions Weight Bearing Restrictions: Yes LLE Weight Bearing: Non weight bearing      Mobility Bed Mobility Overal bed mobility: Needs Assistance Bed Mobility: Supine to Sit     Supine to sit: Supervision     General bed mobility comments: Pt OOB in recliner at beginning and end of session  Transfers Overall transfer level: Needs assistance Equipment used: Rolling walker (2 wheeled) Transfers: Sit to/from [facility] Sit to Stand: Min guard         General transfer comment: min guard for safety    Balance Overall  balance assessment: Needs assistance Sitting-balance support: No upper extremity supported;Feet supported Sitting balance-Leahy Scale: Normal     Standing balance support: Bilateral upper extremity supported;During functional activity Standing balance-Leahy Scale: Poor Standing balance comment: reliant on RW due to NWB LLE                           ADL either performed or assessed with clinical judgement   ADL Overall ADL's : At baseline                                       General ADL Comments: able to complete transfers at supervision level, has been Wc level prior and could demonstrate tub transfer as well as LB dressing - Pt states that he has arranged 24 hour care initially for safety with ADL and IADL     Vision Baseline Vision/History: Legally blind       Perception     Praxis      Pertinent Vitals/Pain Pain Assessment: Faces Faces Pain Scale: Hurts a little bit Pain Location: L ankle Pain Descriptors / Indicators: Sore Pain Intervention(s): Monitored during session;Repositioned     Hand Dominance Right   Extremity/Trunk Assessment Upper Extremity Assessment Upper Extremity Assessment: Overall WFL for tasks assessed   Lower Extremity Assessment Lower Extremity Assessment: LLE deficits/detail LLE Deficits / Details: giant wrapping around LLE is very large   Cervical / Trunk Assessment Cervical /  Trunk Assessment: Normal   Communication Communication Communication: No difficulties   Cognition Arousal/Alertness: Awake/alert Behavior During Therapy: WFL for tasks assessed/performed Overall Cognitive Status: Within Functional Limits for tasks assessed                                     General Comments       Exercises Exercises: General Lower Extremity General Exercises - Upper Extremity Chair Push Up: 10 reps General Exercises - Lower Extremity Straight Leg Raises: AROM;Left;5 reps   Shoulder Instructions       Home Living Family/patient expects to be discharged to:: Private residence Living Arrangements: Other relatives(brother, daughter) Available Help at Discharge: Family;Available PRN/intermittently Type of Home: House Home Access: Ramped entrance     Home Layout: One level     Bathroom Shower/Tub: Teacher, early years/pre: Standard     Home Equipment: Environmental consultant - 2 wheels;Wheelchair - [employer];Shower seat          Prior Functioning/Environment Level of Independence: Independent        Comments: does not drive        OT Problem List:        OT Treatment/Interventions:      OT Goals(Current goals can be found in the care plan section) Acute Rehab OT Goals Patient Stated Goal: get back to walking each morning with his brother OT Goal Formulation: With patient Time For Goal Achievement: 01/20/18 Potential to Achieve Goals: Good  OT Frequency:     Barriers to D/C:            Co-evaluation              AM-PAC PT "6 Clicks" Daily Activity     Outcome Measure Help from another person eating meals?: None Help from another person taking care of personal grooming?: None(in sitting) Help from another person toileting, which includes using toliet, bedpan, or urinal?: A Little Help from another person bathing (including washing, rinsing, drying)?: A Little Help from another person to put on and taking off regular upper body clothing?: None Help from another person to put on and taking off regular lower body clothing?: A Little 6 Click Score: 21   End of Session Equipment Utilized During Treatment: Gait belt;Rolling walker Nurse Communication: Mobility status;Weight bearing status  Activity Tolerance: Patient tolerated treatment well Patient left: in chair;with call bell/phone within reach;with family/visitor present                   Time: 2683-4196 OT Time Calculation (min): 24 min Charges:  OT General Charges $OT Visit: 1  Visit OT Evaluation $OT Eval Moderate Complexity: 1 Mod OT Treatments $Self Care/Home Management : 8-22 mins  Hulda Humphrey OTR/L Acute Rehabilitation Services Pager: [phone] Office: [place] 01/14/2018, 2:47 PM

## 2018-01-14 NOTE — Plan of Care (Signed)
  Problem: Education: Goal: Knowledge of General Education information will improve Description Including pain rating scale, medication(s)/side effects and non-pharmacologic comfort measures Outcome: Progressing Note:  POC and pain management reviewed with pt.   

## 2018-01-14 NOTE — Discharge Instructions (Signed)
Do not put weight on left foot. Keep foot elevated to decrease pain and swelling. See Dr. Lorin Mercy on Thursday for dressing change

## 2018-01-14 NOTE — Care Management Note (Signed)
Case Management Note  Patient Details  Name: Jason Woods MRN: [id number] Date of Birth: 07/13/1945  Subjective/Objective:   72 yr old gentleman s/p ORIF of left ankle fracturei n July 2019.   This repair failed as xrays revealed hardware movement and nonunion. He underwent hardware removal and external fixation 01/07/18. s/p ankle fusion 10/23 .               Action/Plan: patient has been active with [facility], no changes. CM confirmed with [name], [facility] Liaison. CM discussed wheelchair concerns with patient. He has chair at home, CM advised him to have [name] therapist show him how to elevate the leg rest when he is home.    Expected Discharge Date:  01/14/18               Expected Discharge Plan:  [place]  In-House Referral:  NA  Discharge planning Services  CM Consult  Post Acute Care Choice:  Durable Medical Equipment, Home Health, Resumption of Svcs/PTA Provider Choice offered to:  Patient  DME Arranged:  N/A(Has DME) DME Agency:  NA  HH Arranged:  RN, PT, Nurse's Aide, Social Work [employer] Agency:  [place]  Status of Service:  Completed, signed off  If discussed at [employer], dates discussed:    Additional Comments:  [name], RN 01/14/2018, 3:25 PM

## 2018-01-16 DIAGNOSIS — S82852E Displaced trimalleolar fracture of left lower leg, subsequent encounter for open fracture type I or II with routine healing: Secondary | ICD-10-CM | POA: Diagnosis not present

## 2018-01-16 DIAGNOSIS — H548 Legal blindness, as defined in USA: Secondary | ICD-10-CM | POA: Diagnosis not present

## 2018-01-16 DIAGNOSIS — K219 Gastro-esophageal reflux disease without esophagitis: Secondary | ICD-10-CM | POA: Diagnosis not present

## 2018-01-16 DIAGNOSIS — I10 Essential (primary) hypertension: Secondary | ICD-10-CM | POA: Diagnosis not present

## 2018-01-16 DIAGNOSIS — E11649 Type 2 diabetes mellitus with hypoglycemia without coma: Secondary | ICD-10-CM | POA: Diagnosis not present

## 2018-01-16 DIAGNOSIS — S93432D Sprain of tibiofibular ligament of left ankle, subsequent encounter: Secondary | ICD-10-CM | POA: Diagnosis not present

## 2018-01-17 ENCOUNTER — Telehealth (INDEPENDENT_AMBULATORY_CARE_PROVIDER_SITE_OTHER): Payer: Self-pay | Admitting: Orthopaedic Surgery

## 2018-01-17 DIAGNOSIS — S82852E Displaced trimalleolar fracture of left lower leg, subsequent encounter for open fracture type I or II with routine healing: Secondary | ICD-10-CM | POA: Diagnosis not present

## 2018-01-17 DIAGNOSIS — S93432D Sprain of tibiofibular ligament of left ankle, subsequent encounter: Secondary | ICD-10-CM | POA: Diagnosis not present

## 2018-01-17 DIAGNOSIS — E11649 Type 2 diabetes mellitus with hypoglycemia without coma: Secondary | ICD-10-CM | POA: Diagnosis not present

## 2018-01-17 DIAGNOSIS — I10 Essential (primary) hypertension: Secondary | ICD-10-CM | POA: Diagnosis not present

## 2018-01-17 DIAGNOSIS — K219 Gastro-esophageal reflux disease without esophagitis: Secondary | ICD-10-CM | POA: Diagnosis not present

## 2018-01-17 DIAGNOSIS — H548 Legal blindness, as defined in USA: Secondary | ICD-10-CM | POA: Diagnosis not present

## 2018-01-17 NOTE — Telephone Encounter (Signed)
Ok .  (He is in a dressing so currently there is no wound care. )

## 2018-01-17 NOTE — Telephone Encounter (Signed)
I called Jason Woods and advised.

## 2018-01-17 NOTE — Telephone Encounter (Signed)
Debbie, PT at [facility] is requesting verbal orders 1 x a week for 3 weeks   CB # [phone]

## 2018-01-17 NOTE — Discharge Summary (Signed)
Patient ID: Jason Woods MRN: [id number] DOB/AGE: March 29, 1945 72 y.o.  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 Anxiety    "about all my life" (01/07/2018)  . Arthritis    "shoulders, back, hips" (01/07/2018)  . CAP (community acquired pneumonia) 04/2017  . Chronic back pain    "upper and lower" (01/07/2018)  . 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 Depression   . GERD (gastroesophageal reflux disease)   . High cholesterol   . History of kidney stones   . Hypertension   . Legally blind   . Skin cancer    "frozen off my face, arms" (01/07/2018)  . Type II diabetes mellitus ([place])   . Vitamin B deficiency 03/09/2017    Surgeries: Procedure(s): LEFT ANKLE REMOVAL OF EXTERNAL FIXATOR, ANKLE FUSION WITH PHOENIX NAIL REMOVAL EXTERNAL FIXATION ANKLE on 01/12/2018   Consultants:   Discharged Condition: Improved  Hospital Course: HUEY SCALIA is an 72 y.o. male who was admitted 01/07/2018 for operative treatment of <principal problem not specified>. Patient failed conservative treatments (please see the history and physical for the specifics) and had severe unremitting pain that affects sleep, daily activities and work/hobbies. After pre-op clearance, the patient was taken to the operating room on 01/12/2018 and underwent  Procedure(s): LEFT ANKLE REMOVAL OF  EXTERNAL FIXATOR, ANKLE FUSION WITH PHOENIX NAIL REMOVAL EXTERNAL FIXATION ANKLE.    Patient was given perioperative antibiotics:  Anti-infectives (From admission, onward)   Start     Dose/Rate Route Frequency Ordered Stop   01/12/18 2300  ceFAZolin (ANCEF) IVPB 1 g/50 mL premix     1 g 100 mL/hr over 30 Minutes Intravenous Every 8 hours 01/12/18 1817 01/13/18 1530   01/07/18 1930  ceFAZolin (ANCEF) IVPB 2g/100 mL premix     2 g 200 mL/hr over 30 Minutes Intravenous Every 8 hours 01/07/18 1319 01/10/18 1404   01/07/18 1125  vancomycin (VANCOCIN) powder  Status:  Discontinued       As needed 01/07/18 1143 01/07/18 1222   01/07/18 1125  tobramycin (NEBCIN) injection  Status:  Discontinued       As needed 01/07/18 1145 01/07/18 1222   01/07/18 0715  ceFAZolin (ANCEF) 1-4 GM/50ML-% IVPB    Note to Pharmacy:  [facility]   : cabinet override      01/07/18 0715 01/07/18 1929       Patient was given sequential compression devices and early ambulation to prevent DVT.   Patient benefited maximally from hospital stay and there were no complications. At the time of discharge, the patient was urinating/moving their bowels without difficulty, tolerating a regular diet, pain is controlled with oral pain medications and they have been cleared by PT/OT.   Recent vital signs: No data found.   Recent laboratory studies: No results for input(s): WBC, HGB, HCT, PLT, NA, K, CL, CO2, BUN, CREATININE, GLUCOSE, INR, CALCIUM in the last  72 hours.  Invalid input(s): PT, 2   Discharge Medications:   Allergies as of 01/14/2018      Reactions   Lipitor [atorvastatin] Other (See Comments)   Aching, MS   Zocor [simvastatin-high Dose] Other (See Comments)   MS aches   Celexa [citalopram Hydrobromide] Other (See Comments)   Nausea,blurred vision   Crestor [rosuvastatin]    Pain, aching muscles   Mobic [meloxicam] Other (See Comments)   nausea   Prozac [fluoxetine Hcl] Other (See Comments)    fatigue      Medication List    STOP taking these medications   acetaminophen 500 MG tablet Commonly known as:  TYLENOL   ciprofloxacin 500 MG tablet Commonly known as:  CIPRO     TAKE these medications   ALPRAZolam 1 MG tablet Commonly known as:  XANAX Take 1 mg by mouth 3 (three) times daily.   b complex vitamins tablet Take 1 tablet by mouth daily.   bimatoprost 0.01 % Soln Commonly known as:  LUMIGAN Place 1 drop into the right eye at bedtime.   buprenorphine 8 MG Subl SL tablet Commonly known as:  SUBUTEX Place 8 mg under the tongue 2 (two) times daily.   cyanocobalamin 2000 MCG tablet Take 2,000 mcg by mouth daily.   D 2000 2000 units Tabs Generic drug:  Cholecalciferol Take 2,000 Units by mouth daily.   dorzolamide 2 % ophthalmic solution Commonly known as:  TRUSOPT Place 1 drop into both eyes 3 (three) times daily.   DULoxetine 30 MG capsule Commonly known as:  CYMBALTA Take 30 mg by mouth daily.   fluticasone 50 MCG/ACT nasal spray Commonly known as:  FLONASE Place 2 sprays into both nostrils daily.   gabapentin 400 MG capsule Commonly known as:  NEURONTIN Take 400 mg by mouth 2 (two) times daily.   hydrochlorothiazide 25 MG tablet Commonly known as:  HYDRODIURIL Take 25 mg by mouth daily.   insulin glargine 100 UNIT/ML injection Commonly known as:  LANTUS Inject 0.1 mLs (10 Units total) into the skin at bedtime. What changed:    how much to take  when to take this  additional instructions   losartan 25 MG tablet Commonly known as:  COZAAR Take 1 tablet (25 mg total) by mouth daily. What changed:  how much to take   metFORMIN 850 MG tablet Commonly known as:  GLUCOPHAGE Take 850 mg by mouth 2 (two) times daily with a meal.   multivitamin with minerals Tabs tablet Take 1 tablet by mouth daily.   nicotine 14 mg/24hr patch Commonly known as:  NICODERM CQ - dosed in mg/24 hours Place 1 patch (14 mg total) onto the skin daily.    oxyCODONE-acetaminophen 5-325 MG tablet Commonly known as:  PERCOCET/ROXICET Take 1 tablet by mouth every 4 (four) hours as needed for severe pain. What changed:    how much to take  when to take this   pantoprazole 40 MG tablet Commonly known as:  PROTONIX Take 40 mg by mouth daily.   polyethylene glycol packet Commonly known as:  MIRALAX / GLYCOLAX Take 17 g by mouth daily as needed for mild constipation.   triamcinolone cream 0.1 % Commonly known as:  KENALOG Apply 1 application topically daily as needed (affected area(s) on skin).       Diagnostic Studies: Dg Ankle 2 Views Left  Result Date: 01/12/2018 CLINICAL DATA:  Left ankle removal of external fixator with fusion and Phoenix nail. EXAM: LEFT ANKLE - 2  VIEW; DG C-ARM 61-120 MIN COMPARISON:  01/07/2018 FINDINGS: Placement of intramedullary nail with associated screws through the tibia, talus and calcaneus intact. Interval removal of external fixation hardware. Remainder of the exam is unchanged. IMPRESSION: Removal external fixation hardware and placement of intramedullary nail bridging the tibia, talus and calcaneus with associated screws intact. Electronically Signed   By: Marin Olp M.D.   On: 01/12/2018 17:34   Dg Ankle Complete Left  Result Date: 01/07/2018 CLINICAL DATA:  LEFT ankle ex fix EXAM: DG C-ARM 61-120 MIN; LEFT ANKLE COMPLETE - 3+ VIEW COMPARISON:  None. FINDINGS: Five intraoperative fluoroscopic images are provided of the LEFT lower extremity. Images show placement of external fixation hardware. Fluoroscopy provided for 20 seconds. IMPRESSION: Intraoperative fluoroscopic images. No evidence of surgical complicating feature. Electronically Signed   By: Franki Cabot M.D.   On: 01/07/2018 12:44   Dg C-arm 1-60 Min  Result Date: 01/12/2018 CLINICAL DATA:  Left ankle removal of external fixator with fusion and Phoenix nail. EXAM: LEFT ANKLE - 2 VIEW; DG C-ARM 61-120 MIN COMPARISON:  01/07/2018 FINDINGS:  Placement of intramedullary nail with associated screws through the tibia, talus and calcaneus intact. Interval removal of external fixation hardware. Remainder of the exam is unchanged. IMPRESSION: Removal external fixation hardware and placement of intramedullary nail bridging the tibia, talus and calcaneus with associated screws intact. Electronically Signed   By: Marin Olp M.D.   On: 01/12/2018 17:34   Dg C-arm 1-60 Min  Result Date: 01/12/2018 CLINICAL DATA:  Left ankle removal of external fixator with fusion and Phoenix nail. EXAM: LEFT ANKLE - 2 VIEW; DG C-ARM 61-120 MIN COMPARISON:  01/07/2018 FINDINGS: Placement of intramedullary nail with associated screws through the tibia, talus and calcaneus intact. Interval removal of external fixation hardware. Remainder of the exam is unchanged. IMPRESSION: Removal external fixation hardware and placement of intramedullary nail bridging the tibia, talus and calcaneus with associated screws intact. Electronically Signed   By: Marin Olp M.D.   On: 01/12/2018 17:34   Dg C-arm 1-60 Min  Result Date: 01/07/2018 CLINICAL DATA:  LEFT ankle ex fix EXAM: DG C-ARM 61-120 MIN; LEFT ANKLE COMPLETE - 3+ VIEW COMPARISON:  None. FINDINGS: Five intraoperative fluoroscopic images are provided of the LEFT lower extremity. Images show placement of external fixation hardware. Fluoroscopy provided for 20 seconds. IMPRESSION: Intraoperative fluoroscopic images. No evidence of surgical complicating feature. Electronically Signed   By: Franki Cabot M.D.   On: 01/07/2018 12:44   Dg C-arm 1-60 Min  Result Date: 01/07/2018 CLINICAL DATA:  LEFT ankle ex fix EXAM: DG C-ARM 61-120 MIN; LEFT ANKLE COMPLETE - 3+ VIEW COMPARISON:  None. FINDINGS: Five intraoperative fluoroscopic images are provided of the LEFT lower extremity. Images show placement of external fixation hardware. Fluoroscopy provided for 20 seconds. IMPRESSION: Intraoperative fluoroscopic images. No evidence  of surgical complicating feature. Electronically Signed   By: Franki Cabot M.D.   On: 01/07/2018 12:44   Xr Ankle Complete Left  Result Date: 01/06/2018 Three-view x-rays left ankle obtained and reviewed.  This shows nonunion with distal screw pullout from the fibula and displacement of the medial malleolus with ankle subluxation.  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     Follow-up Information    [name], MD Follow up in 6 day(s).   Specialty:  Orthopedic Surgery Why:  call for appt in [facility] clinic  for dressing change on Thursday with Dr. Verlene Mayer information: [place] [id number] [phone]        [facility] Follow up.   Specialty:  [facility] Why:  A representative from [facility] will contact you to arrange start date and time for your therapy. Contact information: [address] [place] [address] [phone]           Discharge Plan:  discharge to home  Disposition:     Signed: Benjiman Core  01/17/2018, 12:11 PM

## 2018-01-17 NOTE — Telephone Encounter (Signed)
[facility]  [phone]    Nursing orders   Estill Bamberg from [place] she needed to know if it was ok to proceed with nursing orders. Estill Bamberg would like to monitor Mr.[place]ro[place] diabetes and wound care.

## 2018-01-17 NOTE — Telephone Encounter (Signed)
Please advise 

## 2018-01-18 DIAGNOSIS — S93432D Sprain of tibiofibular ligament of left ankle, subsequent encounter: Secondary | ICD-10-CM | POA: Diagnosis not present

## 2018-01-18 DIAGNOSIS — E11649 Type 2 diabetes mellitus with hypoglycemia without coma: Secondary | ICD-10-CM | POA: Diagnosis not present

## 2018-01-18 DIAGNOSIS — K219 Gastro-esophageal reflux disease without esophagitis: Secondary | ICD-10-CM | POA: Diagnosis not present

## 2018-01-18 DIAGNOSIS — I10 Essential (primary) hypertension: Secondary | ICD-10-CM | POA: Diagnosis not present

## 2018-01-18 DIAGNOSIS — S82852E Displaced trimalleolar fracture of left lower leg, subsequent encounter for open fracture type I or II with routine healing: Secondary | ICD-10-CM | POA: Diagnosis not present

## 2018-01-18 DIAGNOSIS — H548 Legal blindness, as defined in USA: Secondary | ICD-10-CM | POA: Diagnosis not present

## 2018-01-18 NOTE — Telephone Encounter (Signed)
I left voicemail for Jason Woods advising.

## 2018-01-18 NOTE — Telephone Encounter (Signed)
Ok for orders? 

## 2018-01-18 NOTE — Telephone Encounter (Signed)
OK - thanks

## 2018-01-20 ENCOUNTER — Encounter (INDEPENDENT_AMBULATORY_CARE_PROVIDER_SITE_OTHER): Payer: Self-pay | Admitting: Orthopaedic Surgery

## 2018-01-20 ENCOUNTER — Ambulatory Visit (INDEPENDENT_AMBULATORY_CARE_PROVIDER_SITE_OTHER): Payer: Medicaid Other

## 2018-01-20 ENCOUNTER — Ambulatory Visit (INDEPENDENT_AMBULATORY_CARE_PROVIDER_SITE_OTHER): Payer: Medicare Other | Admitting: Orthopaedic Surgery

## 2018-01-20 VITALS — BP 116/62 | HR 96 | Ht 67.0 in | Wt 158.0 lb

## 2018-01-20 DIAGNOSIS — S82842K Displaced bimalleolar fracture of left lower leg, subsequent encounter for closed fracture with nonunion: Secondary | ICD-10-CM

## 2018-01-20 NOTE — Progress Notes (Signed)
Patient returns had nonunion of trimalleolar ankle fracture and was treated with intramedullary rod fixation with ankle fusion.  All cultures were negative.  He has some swelling is been keeping his foot propped up as much as he can.  Patient has quit smoking and states he took the NicoDerm patch off and does not need it.  X-rays AP and lateral demonstrate good position and alignment.  I will recheck him in 1 week.  We likely will have to leave the sutures in for several weeks.

## 2018-01-25 DIAGNOSIS — H548 Legal blindness, as defined in USA: Secondary | ICD-10-CM | POA: Diagnosis not present

## 2018-01-25 DIAGNOSIS — I10 Essential (primary) hypertension: Secondary | ICD-10-CM | POA: Diagnosis not present

## 2018-01-25 DIAGNOSIS — S82852E Displaced trimalleolar fracture of left lower leg, subsequent encounter for open fracture type I or II with routine healing: Secondary | ICD-10-CM | POA: Diagnosis not present

## 2018-01-25 DIAGNOSIS — E11649 Type 2 diabetes mellitus with hypoglycemia without coma: Secondary | ICD-10-CM | POA: Diagnosis not present

## 2018-01-25 DIAGNOSIS — K219 Gastro-esophageal reflux disease without esophagitis: Secondary | ICD-10-CM | POA: Diagnosis not present

## 2018-01-25 DIAGNOSIS — S93432D Sprain of tibiofibular ligament of left ankle, subsequent encounter: Secondary | ICD-10-CM | POA: Diagnosis not present

## 2018-01-27 ENCOUNTER — Ambulatory Visit (INDEPENDENT_AMBULATORY_CARE_PROVIDER_SITE_OTHER): Payer: Medicare Other | Admitting: Orthopaedic Surgery

## 2018-01-27 ENCOUNTER — Encounter (INDEPENDENT_AMBULATORY_CARE_PROVIDER_SITE_OTHER): Payer: Self-pay | Admitting: Orthopaedic Surgery

## 2018-01-27 VITALS — BP 106/61 | HR 103 | Ht 67.0 in | Wt 158.0 lb

## 2018-01-27 DIAGNOSIS — S82852E Displaced trimalleolar fracture of left lower leg, subsequent encounter for open fracture type I or II with routine healing: Secondary | ICD-10-CM | POA: Diagnosis not present

## 2018-01-27 DIAGNOSIS — S93432D Sprain of tibiofibular ligament of left ankle, subsequent encounter: Secondary | ICD-10-CM | POA: Diagnosis not present

## 2018-01-27 DIAGNOSIS — E11649 Type 2 diabetes mellitus with hypoglycemia without coma: Secondary | ICD-10-CM | POA: Diagnosis not present

## 2018-01-27 DIAGNOSIS — H548 Legal blindness, as defined in USA: Secondary | ICD-10-CM | POA: Diagnosis not present

## 2018-01-27 DIAGNOSIS — Z981 Arthrodesis status: Secondary | ICD-10-CM

## 2018-01-27 DIAGNOSIS — I10 Essential (primary) hypertension: Secondary | ICD-10-CM | POA: Diagnosis not present

## 2018-01-27 DIAGNOSIS — K219 Gastro-esophageal reflux disease without esophagitis: Secondary | ICD-10-CM | POA: Diagnosis not present

## 2018-01-27 NOTE — Progress Notes (Signed)
Follow-up left Phoenix nail ankle fusion for open ankle fracture nonunion with failure of fixation and collapse.  Sutures are removed today Steri-Strips short leg fiberglass cast applied.  His ankle looks straight.  He is nonweightbearing except when he has to transfer to the bathroom he applies some weight.  He will return in 5 weeks for cast off and x-rays out of cast.

## 2018-01-29 DIAGNOSIS — S93432D Sprain of tibiofibular ligament of left ankle, subsequent encounter: Secondary | ICD-10-CM | POA: Diagnosis not present

## 2018-01-29 DIAGNOSIS — I10 Essential (primary) hypertension: Secondary | ICD-10-CM | POA: Diagnosis not present

## 2018-01-29 DIAGNOSIS — S82852E Displaced trimalleolar fracture of left lower leg, subsequent encounter for open fracture type I or II with routine healing: Secondary | ICD-10-CM | POA: Diagnosis not present

## 2018-01-29 DIAGNOSIS — E11649 Type 2 diabetes mellitus with hypoglycemia without coma: Secondary | ICD-10-CM | POA: Diagnosis not present

## 2018-01-29 DIAGNOSIS — K219 Gastro-esophageal reflux disease without esophagitis: Secondary | ICD-10-CM | POA: Diagnosis not present

## 2018-01-29 DIAGNOSIS — H548 Legal blindness, as defined in USA: Secondary | ICD-10-CM | POA: Diagnosis not present

## 2018-02-01 DIAGNOSIS — K219 Gastro-esophageal reflux disease without esophagitis: Secondary | ICD-10-CM | POA: Diagnosis not present

## 2018-02-01 DIAGNOSIS — I10 Essential (primary) hypertension: Secondary | ICD-10-CM | POA: Diagnosis not present

## 2018-02-01 DIAGNOSIS — S93432D Sprain of tibiofibular ligament of left ankle, subsequent encounter: Secondary | ICD-10-CM | POA: Diagnosis not present

## 2018-02-01 DIAGNOSIS — H548 Legal blindness, as defined in USA: Secondary | ICD-10-CM | POA: Diagnosis not present

## 2018-02-01 DIAGNOSIS — E11649 Type 2 diabetes mellitus with hypoglycemia without coma: Secondary | ICD-10-CM | POA: Diagnosis not present

## 2018-02-01 DIAGNOSIS — S82852E Displaced trimalleolar fracture of left lower leg, subsequent encounter for open fracture type I or II with routine healing: Secondary | ICD-10-CM | POA: Diagnosis not present

## 2018-02-02 DIAGNOSIS — H548 Legal blindness, as defined in USA: Secondary | ICD-10-CM | POA: Diagnosis not present

## 2018-02-02 DIAGNOSIS — I10 Essential (primary) hypertension: Secondary | ICD-10-CM | POA: Diagnosis not present

## 2018-02-02 DIAGNOSIS — S82852E Displaced trimalleolar fracture of left lower leg, subsequent encounter for open fracture type I or II with routine healing: Secondary | ICD-10-CM | POA: Diagnosis not present

## 2018-02-02 DIAGNOSIS — K219 Gastro-esophageal reflux disease without esophagitis: Secondary | ICD-10-CM | POA: Diagnosis not present

## 2018-02-02 DIAGNOSIS — E11649 Type 2 diabetes mellitus with hypoglycemia without coma: Secondary | ICD-10-CM | POA: Diagnosis not present

## 2018-02-02 DIAGNOSIS — S93432D Sprain of tibiofibular ligament of left ankle, subsequent encounter: Secondary | ICD-10-CM | POA: Diagnosis not present

## 2018-02-04 DIAGNOSIS — I10 Essential (primary) hypertension: Secondary | ICD-10-CM | POA: Diagnosis not present

## 2018-02-04 DIAGNOSIS — S93432D Sprain of tibiofibular ligament of left ankle, subsequent encounter: Secondary | ICD-10-CM | POA: Diagnosis not present

## 2018-02-04 DIAGNOSIS — K219 Gastro-esophageal reflux disease without esophagitis: Secondary | ICD-10-CM | POA: Diagnosis not present

## 2018-02-04 DIAGNOSIS — E11649 Type 2 diabetes mellitus with hypoglycemia without coma: Secondary | ICD-10-CM | POA: Diagnosis not present

## 2018-02-04 DIAGNOSIS — H548 Legal blindness, as defined in USA: Secondary | ICD-10-CM | POA: Diagnosis not present

## 2018-02-04 DIAGNOSIS — S82852E Displaced trimalleolar fracture of left lower leg, subsequent encounter for open fracture type I or II with routine healing: Secondary | ICD-10-CM | POA: Diagnosis not present

## 2018-02-09 DIAGNOSIS — Z1339 Encounter for screening examination for other mental health and behavioral disorders: Secondary | ICD-10-CM | POA: Diagnosis not present

## 2018-02-09 DIAGNOSIS — I10 Essential (primary) hypertension: Secondary | ICD-10-CM | POA: Diagnosis not present

## 2018-02-09 DIAGNOSIS — Z1211 Encounter for screening for malignant neoplasm of colon: Secondary | ICD-10-CM | POA: Diagnosis not present

## 2018-02-09 DIAGNOSIS — Z6825 Body mass index (BMI) 25.0-25.9, adult: Secondary | ICD-10-CM | POA: Diagnosis not present

## 2018-02-09 DIAGNOSIS — Z125 Encounter for screening for malignant neoplasm of prostate: Secondary | ICD-10-CM | POA: Diagnosis not present

## 2018-02-09 DIAGNOSIS — R5383 Other fatigue: Secondary | ICD-10-CM | POA: Diagnosis not present

## 2018-02-09 DIAGNOSIS — E559 Vitamin D deficiency, unspecified: Secondary | ICD-10-CM | POA: Diagnosis not present

## 2018-02-09 DIAGNOSIS — E538 Deficiency of other specified B group vitamins: Secondary | ICD-10-CM | POA: Diagnosis not present

## 2018-02-09 DIAGNOSIS — Z1331 Encounter for screening for depression: Secondary | ICD-10-CM | POA: Diagnosis not present

## 2018-02-09 DIAGNOSIS — Z79899 Other long term (current) drug therapy: Secondary | ICD-10-CM | POA: Diagnosis not present

## 2018-02-09 DIAGNOSIS — Z299 Encounter for prophylactic measures, unspecified: Secondary | ICD-10-CM | POA: Diagnosis not present

## 2018-02-09 DIAGNOSIS — E1142 Type 2 diabetes mellitus with diabetic polyneuropathy: Secondary | ICD-10-CM | POA: Diagnosis not present

## 2018-02-09 DIAGNOSIS — E7800 Pure hypercholesterolemia, unspecified: Secondary | ICD-10-CM | POA: Diagnosis not present

## 2018-02-09 DIAGNOSIS — Z0000 Encounter for general adult medical examination without abnormal findings: Secondary | ICD-10-CM | POA: Diagnosis not present

## 2018-02-09 DIAGNOSIS — Z7189 Other specified counseling: Secondary | ICD-10-CM | POA: Diagnosis not present

## 2018-02-22 DIAGNOSIS — M79662 Pain in left lower leg: Secondary | ICD-10-CM | POA: Diagnosis not present

## 2018-02-22 DIAGNOSIS — E7800 Pure hypercholesterolemia, unspecified: Secondary | ICD-10-CM | POA: Diagnosis not present

## 2018-02-22 DIAGNOSIS — M25532 Pain in left wrist: Secondary | ICD-10-CM | POA: Diagnosis not present

## 2018-02-22 DIAGNOSIS — M545 Low back pain: Secondary | ICD-10-CM | POA: Diagnosis not present

## 2018-02-22 DIAGNOSIS — Z79891 Long term (current) use of opiate analgesic: Secondary | ICD-10-CM | POA: Diagnosis not present

## 2018-02-22 DIAGNOSIS — M25559 Pain in unspecified hip: Secondary | ICD-10-CM | POA: Diagnosis not present

## 2018-02-22 DIAGNOSIS — M5010 Cervical disc disorder with radiculopathy, unspecified cervical region: Secondary | ICD-10-CM | POA: Diagnosis not present

## 2018-02-22 DIAGNOSIS — I10 Essential (primary) hypertension: Secondary | ICD-10-CM | POA: Diagnosis not present

## 2018-02-22 DIAGNOSIS — E119 Type 2 diabetes mellitus without complications: Secondary | ICD-10-CM | POA: Diagnosis not present

## 2018-02-28 DIAGNOSIS — Z299 Encounter for prophylactic measures, unspecified: Secondary | ICD-10-CM | POA: Diagnosis not present

## 2018-02-28 DIAGNOSIS — Z6825 Body mass index (BMI) 25.0-25.9, adult: Secondary | ICD-10-CM | POA: Diagnosis not present

## 2018-02-28 DIAGNOSIS — K5900 Constipation, unspecified: Secondary | ICD-10-CM | POA: Diagnosis not present

## 2018-02-28 DIAGNOSIS — I10 Essential (primary) hypertension: Secondary | ICD-10-CM | POA: Diagnosis not present

## 2018-03-03 ENCOUNTER — Ambulatory Visit (INDEPENDENT_AMBULATORY_CARE_PROVIDER_SITE_OTHER): Payer: Medicare Other | Admitting: Orthopaedic Surgery

## 2018-03-03 ENCOUNTER — Encounter (INDEPENDENT_AMBULATORY_CARE_PROVIDER_SITE_OTHER): Payer: Self-pay | Admitting: Orthopaedic Surgery

## 2018-03-03 ENCOUNTER — Ambulatory Visit (INDEPENDENT_AMBULATORY_CARE_PROVIDER_SITE_OTHER): Payer: Medicare Other

## 2018-03-03 VITALS — BP 167/87 | HR 81 | Ht 67.0 in | Wt 158.0 lb

## 2018-03-03 DIAGNOSIS — Z981 Arthrodesis status: Secondary | ICD-10-CM

## 2018-03-03 NOTE — Progress Notes (Signed)
Post-Op Visit Note   Patient: Jason Woods           Date of Birth: [birth year]           MRN: [id number] Visit Date: 03/03/2018 PCP: Glenda Chroman, MD   Assessment & Plan: Follow-up ankle fusion 01/12/2018.  He can use his cam boot he is out of the cast today incisions look good.  He will be gradually progressive weightbearing starting at touchdown weightbearing recheck 6 weeks.  Chief Complaint:  Chief Complaint  Patient presents with  . Left Ankle - Follow-up    01/12/18 Left Ankle Fusion with Phoenix Nail   Visit Diagnoses:  1. S/P ankle fusion     Plan: Return in 6 weeks 2 view x-ray left ankle on return.  Follow-Up Instructions: No follow-ups on file.   Orders:  Orders Placed This Encounter  Procedures  . XR Ankle Complete Left   No orders of the defined types were placed in this encounter.   Imaging: No results found.  PMFS History: Patient Active Problem List   Diagnosis Date Noted  . S/P ankle fusion 01/27/2018  . Fracture of left ankle 01/07/2018  . 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 Open left ankle fracture 10/05/2017  . Trimalleolar fracture, left, open type I or II, with nonunion, subsequent encounter 10/05/2017  . PNA (pneumonia) 04/29/2017  . Esophageal dysphagia 03/31/2017  . Abnormal esophagram 03/31/2017  . Diabetes ([place]) 03/09/2017  . Vitamin B deficiency 03/09/2017  . High cholesterol   . Hypertension   . GERD (gastroesophageal reflux disease)    Past Medical History:  Diagnosis Date  . Anxiety    "about all my life" (01/07/2018)  . Arthritis    "shoulders, back, hips" (01/07/2018)  . CAP (community acquired pneumonia) 04/2017  . Chronic back pain    "upper and lower" (01/07/2018)  . 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 Depression   . GERD (gastroesophageal reflux disease)   . High cholesterol   . History of kidney stones   . Hypertension   . Legally blind   . Skin cancer    "frozen off my face, arms" (01/07/2018)  . Type II diabetes mellitus ([place])   . Vitamin B deficiency 03/09/2017    Family History  Problem Relation Age of Onset  . Diabetes Mother   . Heart attack Father   . Diabetes Sister   . Diabetes Brother     Past Surgical History:  Procedure Laterality Date  . ANKLE FUSION Left 01/12/2018   Procedure: LEFT ANKLE REMOVAL OF EXTERNAL FIXATOR, ANKLE FUSION WITH PHOENIX NAIL;  Surgeon: [name], MD;  Location: [facility];  Service: Orthopedics;  Laterality: Left;  . ANKLE HARDWARE REMOVAL Left 01/07/2018   REMOVAL OF HARDWARE, TAKE DOWN OF NONUNION, INSERTION OF ANTIBIOTC BEADS/notes 01/07/2018  . BIOPSY  04/23/2017   Procedure: BIOPSY;  Surgeon: Rogene Houston, MD;  [facility];  Service: Endoscopy;;  gastric  . CATARACT EXTRACTION W/ INTRAOCULAR LENS  IMPLANT, BILATERAL Bilateral   . ESOPHAGEAL DILATION N/A 04/23/2017   Procedure: ESOPHAGEAL DILATION;  Surgeon: Rogene Houston, MD;  [facility];  Service: Endoscopy;  Laterality: N/A;  . ESOPHAGOGASTRODUODENOSCOPY N/A 04/23/2017   Procedure: ESOPHAGOGASTRODUODENOSCOPY (EGD);  Surgeon: Rogene Houston, MD;  [facility];  Service: Endoscopy;  Laterality: N/A;  9:55-moved to 1040 per Lelon Frohlich  . EXTERNAL FIXATION LEG Left 01/07/2018  EXTERNAL FIXATION LEG, TIBIA TO CALCANEUS/notes 01/07/2018  . EXTERNAL FIXATION LEG Left 01/07/2018   Procedure: EXTERNAL FIXATION LEG, TIBIA TO CALCANEUS;  Surgeon: [name], MD;  Location: [facility];  Service: Orthopedics;  Laterality: Left;  . EXTERNAL FIXATION REMOVAL Left 01/12/2018   Procedure: REMOVAL EXTERNAL FIXATION ANKLE;  Surgeon: [name], MD;  Location: [facility];  Service: Orthopedics;  Laterality: Left;  . FRACTURE SURGERY    . HARDWARE REMOVAL Left 01/07/2018   Procedure: LEFT ANKLE REMOVAL OF HARDWARE, TAKE DOWN OF NONUNION, INSERTION OF ANTIBIOTC BEADS;  Surgeon: [name], MD;  Location: [facility];  Service: Orthopedics;   Laterality: Left;  . I&D EXTREMITY Left 10/05/2017   Procedure: IRRIGATION AND DEBRIDEMENT QPEN ANKLE FRACTURE;  Surgeon: [name], MD;  Location: [facility];  Service: Orthopedics;  Laterality: Left;  . INNER EAR SURGERY Right 1962   "scraped the bone; couldn't hear out of it; was infected"  . MULTIPLE TOOTH EXTRACTIONS  2018  . ORIF ANKLE FRACTURE Left 10/05/2017   Procedure: OPEN REDUCTION INTERNAL FIXATION TRIMALLEOLAR (ORIF)ANKLE FRACTURE;  Surgeon: [name], MD;  Location: [facility];  Service: Orthopedics;  Laterality: Left;  . WRIST FRACTURE SURGERY Left 2018   Social History   Occupational History  . Not on file  Tobacco Use  . Smoking status: Current Every Day Smoker    Packs/day: 0.75    Years: 62.00    Pack years: 46.50    Types: Cigarettes  . Smokeless tobacco: Never Used  Substance and Sexual Activity  . Alcohol use: Not Currently    Frequency: Never    Comment: 01/07/2018 "nothing since the 1990s"  . Drug use: Not Currently    Comment: 01/07/2018  "nothing since 1980s"  . Sexual activity: Not Currently

## 2018-03-10 ENCOUNTER — Inpatient Hospital Stay (INDEPENDENT_AMBULATORY_CARE_PROVIDER_SITE_OTHER): Payer: Medicare Other | Admitting: Orthopaedic Surgery

## 2018-03-21 DIAGNOSIS — Z79891 Long term (current) use of opiate analgesic: Secondary | ICD-10-CM | POA: Diagnosis not present

## 2018-03-21 DIAGNOSIS — F320 Major depressive disorder, single episode, mild: Secondary | ICD-10-CM | POA: Diagnosis not present

## 2018-03-22 ENCOUNTER — Ambulatory Visit (INDEPENDENT_AMBULATORY_CARE_PROVIDER_SITE_OTHER): Payer: Medicare Other | Admitting: Internal Medicine

## 2018-03-30 DIAGNOSIS — E7800 Pure hypercholesterolemia, unspecified: Secondary | ICD-10-CM | POA: Diagnosis not present

## 2018-03-30 DIAGNOSIS — I10 Essential (primary) hypertension: Secondary | ICD-10-CM | POA: Diagnosis not present

## 2018-03-30 DIAGNOSIS — E119 Type 2 diabetes mellitus without complications: Secondary | ICD-10-CM | POA: Diagnosis not present

## 2018-03-31 ENCOUNTER — Encounter (INDEPENDENT_AMBULATORY_CARE_PROVIDER_SITE_OTHER): Payer: Self-pay | Admitting: Orthopaedic Surgery

## 2018-03-31 ENCOUNTER — Ambulatory Visit (INDEPENDENT_AMBULATORY_CARE_PROVIDER_SITE_OTHER): Payer: Medicare Other | Admitting: Orthopaedic Surgery

## 2018-03-31 ENCOUNTER — Ambulatory Visit (INDEPENDENT_AMBULATORY_CARE_PROVIDER_SITE_OTHER): Payer: Self-pay

## 2018-03-31 VITALS — BP 137/65 | HR 96 | Ht 67.0 in | Wt 158.0 lb

## 2018-03-31 DIAGNOSIS — Z981 Arthrodesis status: Secondary | ICD-10-CM

## 2018-03-31 NOTE — Progress Notes (Signed)
Post-Op Visit Note   Patient: Jason Woods           Date of Birth: May 09, 1945           MRN: [id number] Visit Date: 03/31/2018 PCP: Glenda Chroman, MD   Assessment & Plan: Post left ankle fusion 01/12/2018.  X-rays demonstrate interval healing of the fusion.  Rod and interlock screws are intact.  He has mild swelling of the ankle and foot he is walking better.  I will check him in 2 months repeat x-ray on return 2 view of his left ankle.  Chief Complaint:  Chief Complaint  Patient presents with  . Left Ankle - Follow-up    01/12/18 Left Ankle Fusion   Visit Diagnoses:  1. S/P ankle fusion     Plan: Return 2 months repeat x-rays on return.  Follow-Up Instructions: No follow-ups on file.   Orders:  Orders Placed This Encounter  Procedures  . XR Ankle 2 Views Left   No orders of the defined types were placed in this encounter.   Imaging: No results found.  PMFS History: Patient Active Problem List   Diagnosis Date Noted  . S/P ankle fusion 01/27/2018  . Fracture of left ankle 01/07/2018  . 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 Open left ankle fracture 10/05/2017  . Trimalleolar fracture, left, open type I or II, with nonunion, subsequent encounter 10/05/2017  . PNA (pneumonia) 04/29/2017  . Esophageal dysphagia 03/31/2017  . Abnormal esophagram 03/31/2017  . Diabetes ([place]) 03/09/2017  . Vitamin B deficiency 03/09/2017  . High cholesterol   . Hypertension   . GERD (gastroesophageal reflux disease)    Past Medical History:  Diagnosis Date  . Anxiety    "about all my life" (01/07/2018)  . Arthritis    "shoulders, back, hips" (01/07/2018)  . CAP (community acquired pneumonia) 04/2017  . Chronic back pain    "upper and lower" (01/07/2018)  . 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 Depression   . GERD (gastroesophageal reflux disease)   . High cholesterol   . History of kidney stones   . Hypertension   .  Legally blind   . Skin cancer    "frozen off my face, arms" (01/07/2018)  . Type II diabetes mellitus ([place])   . Vitamin B deficiency 03/09/2017    Family History  Problem Relation Age of Onset  . Diabetes Mother   . Heart attack Father   . Diabetes Sister   . Diabetes Brother     Past Surgical History:  Procedure Laterality Date  . ANKLE FUSION Left 01/12/2018   Procedure: LEFT ANKLE REMOVAL OF EXTERNAL FIXATOR, ANKLE FUSION WITH PHOENIX NAIL;  Surgeon: [name], MD;  Location: [facility];  Service: Orthopedics;  Laterality: Left;  . ANKLE HARDWARE REMOVAL Left 01/07/2018   REMOVAL OF HARDWARE, TAKE DOWN OF NONUNION, INSERTION OF ANTIBIOTC BEADS/notes 01/07/2018  . BIOPSY  04/23/2017   Procedure: BIOPSY;  Surgeon: Rogene Houston, MD;  [facility];  Service: Endoscopy;;  gastric  . CATARACT EXTRACTION W/ INTRAOCULAR LENS  IMPLANT, BILATERAL Bilateral   . ESOPHAGEAL DILATION N/A 04/23/2017   Procedure: ESOPHAGEAL DILATION;  Surgeon: Rogene Houston, MD;  [facility];  Service: Endoscopy;  Laterality: N/A;  . ESOPHAGOGASTRODUODENOSCOPY N/A 04/23/2017   Procedure: ESOPHAGOGASTRODUODENOSCOPY (EGD);  Surgeon: Rogene Houston, MD;  [facility];  Service: Endoscopy;  Laterality: N/A;  9:55-moved to  1040 per Lelon Frohlich  . EXTERNAL FIXATION LEG Left 01/07/2018   EXTERNAL FIXATION LEG, TIBIA TO CALCANEUS/notes 01/07/2018  . EXTERNAL FIXATION LEG Left 01/07/2018   Procedure: EXTERNAL FIXATION LEG, TIBIA TO CALCANEUS;  Surgeon: [name], MD;  Location: [facility];  Service: Orthopedics;  Laterality: Left;  . EXTERNAL FIXATION REMOVAL Left 01/12/2018   Procedure: REMOVAL EXTERNAL FIXATION ANKLE;  Surgeon: [name], MD;  Location: [facility];  Service: Orthopedics;  Laterality: Left;  . FRACTURE SURGERY    . HARDWARE REMOVAL Left 01/07/2018   Procedure: LEFT ANKLE REMOVAL OF HARDWARE, TAKE DOWN OF NONUNION, INSERTION OF ANTIBIOTC BEADS;  Surgeon: [name], MD;   Location: [facility];  Service: Orthopedics;  Laterality: Left;  . I&D EXTREMITY Left 10/05/2017   Procedure: IRRIGATION AND DEBRIDEMENT QPEN ANKLE FRACTURE;  Surgeon: [name], MD;  Location: [facility];  Service: Orthopedics;  Laterality: Left;  . INNER EAR SURGERY Right 1962   "scraped the bone; couldn't hear out of it; was infected"  . MULTIPLE TOOTH EXTRACTIONS  2018  . ORIF ANKLE FRACTURE Left 10/05/2017   Procedure: OPEN REDUCTION INTERNAL FIXATION TRIMALLEOLAR (ORIF)ANKLE FRACTURE;  Surgeon: [name], MD;  Location: [facility];  Service: Orthopedics;  Laterality: Left;  . WRIST FRACTURE SURGERY Left 2018   Social History   Occupational History  . Not on file  Tobacco Use  . Smoking status: Current Every Day Smoker    Packs/day: 0.75    Years: 62.00    Pack years: 46.50    Types: Cigarettes  . Smokeless tobacco: Never Used  Substance and Sexual Activity  . Alcohol use: Not Currently    Frequency: Never    Comment: 01/07/2018 "nothing since the 1990s"  . Drug use: Not Currently    Comment: 01/07/2018  "nothing since 1980s"  . Sexual activity: Not Currently

## 2018-04-12 DIAGNOSIS — H52203 Unspecified astigmatism, bilateral: Secondary | ICD-10-CM | POA: Diagnosis not present

## 2018-04-12 DIAGNOSIS — E1142 Type 2 diabetes mellitus with diabetic polyneuropathy: Secondary | ICD-10-CM | POA: Diagnosis not present

## 2018-04-12 DIAGNOSIS — H401134 Primary open-angle glaucoma, bilateral, indeterminate stage: Secondary | ICD-10-CM | POA: Diagnosis not present

## 2018-04-12 DIAGNOSIS — E7800 Pure hypercholesterolemia, unspecified: Secondary | ICD-10-CM | POA: Diagnosis not present

## 2018-04-12 DIAGNOSIS — Z299 Encounter for prophylactic measures, unspecified: Secondary | ICD-10-CM | POA: Diagnosis not present

## 2018-04-12 DIAGNOSIS — E1165 Type 2 diabetes mellitus with hyperglycemia: Secondary | ICD-10-CM | POA: Diagnosis not present

## 2018-04-12 DIAGNOSIS — Z961 Presence of intraocular lens: Secondary | ICD-10-CM | POA: Diagnosis not present

## 2018-04-12 DIAGNOSIS — I10 Essential (primary) hypertension: Secondary | ICD-10-CM | POA: Diagnosis not present

## 2018-04-12 DIAGNOSIS — E113292 Type 2 diabetes mellitus with mild nonproliferative diabetic retinopathy without macular edema, left eye: Secondary | ICD-10-CM | POA: Diagnosis not present

## 2018-04-12 DIAGNOSIS — Z6825 Body mass index (BMI) 25.0-25.9, adult: Secondary | ICD-10-CM | POA: Diagnosis not present

## 2018-04-18 DIAGNOSIS — M25559 Pain in unspecified hip: Secondary | ICD-10-CM | POA: Diagnosis not present

## 2018-04-18 DIAGNOSIS — M5010 Cervical disc disorder with radiculopathy, unspecified cervical region: Secondary | ICD-10-CM | POA: Diagnosis not present

## 2018-04-18 DIAGNOSIS — M25532 Pain in left wrist: Secondary | ICD-10-CM | POA: Diagnosis not present

## 2018-04-18 DIAGNOSIS — M79662 Pain in left lower leg: Secondary | ICD-10-CM | POA: Diagnosis not present

## 2018-05-10 DIAGNOSIS — L259 Unspecified contact dermatitis, unspecified cause: Secondary | ICD-10-CM | POA: Diagnosis not present

## 2018-05-10 DIAGNOSIS — L570 Actinic keratosis: Secondary | ICD-10-CM | POA: Diagnosis not present

## 2018-05-10 DIAGNOSIS — Z85828 Personal history of other malignant neoplasm of skin: Secondary | ICD-10-CM | POA: Diagnosis not present

## 2018-05-10 DIAGNOSIS — D0462 Carcinoma in situ of skin of left upper limb, including shoulder: Secondary | ICD-10-CM | POA: Diagnosis not present

## 2018-05-10 DIAGNOSIS — B078 Other viral warts: Secondary | ICD-10-CM | POA: Diagnosis not present

## 2018-05-10 DIAGNOSIS — D485 Neoplasm of uncertain behavior of skin: Secondary | ICD-10-CM | POA: Diagnosis not present

## 2018-05-19 DIAGNOSIS — L0100 Impetigo, unspecified: Secondary | ICD-10-CM | POA: Diagnosis not present

## 2018-05-19 DIAGNOSIS — C44629 Squamous cell carcinoma of skin of left upper limb, including shoulder: Secondary | ICD-10-CM | POA: Diagnosis not present

## 2018-05-23 DIAGNOSIS — B9562 Methicillin resistant Staphylococcus aureus infection as the cause of diseases classified elsewhere: Secondary | ICD-10-CM | POA: Diagnosis not present

## 2018-05-23 DIAGNOSIS — L570 Actinic keratosis: Secondary | ICD-10-CM | POA: Diagnosis not present

## 2018-05-23 DIAGNOSIS — L0390 Cellulitis, unspecified: Secondary | ICD-10-CM | POA: Diagnosis not present

## 2018-05-24 DIAGNOSIS — Z6826 Body mass index (BMI) 26.0-26.9, adult: Secondary | ICD-10-CM | POA: Diagnosis not present

## 2018-05-24 DIAGNOSIS — E1142 Type 2 diabetes mellitus with diabetic polyneuropathy: Secondary | ICD-10-CM | POA: Diagnosis not present

## 2018-05-24 DIAGNOSIS — I10 Essential (primary) hypertension: Secondary | ICD-10-CM | POA: Diagnosis not present

## 2018-05-24 DIAGNOSIS — F17210 Nicotine dependence, cigarettes, uncomplicated: Secondary | ICD-10-CM | POA: Diagnosis not present

## 2018-05-24 DIAGNOSIS — Z299 Encounter for prophylactic measures, unspecified: Secondary | ICD-10-CM | POA: Diagnosis not present

## 2018-05-24 DIAGNOSIS — E1165 Type 2 diabetes mellitus with hyperglycemia: Secondary | ICD-10-CM | POA: Diagnosis not present

## 2018-05-24 DIAGNOSIS — R42 Dizziness and giddiness: Secondary | ICD-10-CM | POA: Diagnosis not present

## 2018-05-26 ENCOUNTER — Encounter (INDEPENDENT_AMBULATORY_CARE_PROVIDER_SITE_OTHER): Payer: Self-pay | Admitting: Orthopaedic Surgery

## 2018-05-26 ENCOUNTER — Ambulatory Visit (INDEPENDENT_AMBULATORY_CARE_PROVIDER_SITE_OTHER): Payer: Medicare Other

## 2018-05-26 ENCOUNTER — Ambulatory Visit (INDEPENDENT_AMBULATORY_CARE_PROVIDER_SITE_OTHER): Payer: Medicare Other | Admitting: Orthopaedic Surgery

## 2018-05-26 VITALS — BP 128/75 | HR 76 | Ht 67.0 in | Wt 158.0 lb

## 2018-05-26 DIAGNOSIS — Z981 Arthrodesis status: Secondary | ICD-10-CM

## 2018-05-26 NOTE — Progress Notes (Signed)
Office Visit Note   Patient: Jason Woods           Date of Birth: 08-20-1945           MRN: [id number] Visit Date: 05/26/2018              Requested by: Glenda Chroman, MD [place], [place] [address] PCP: Glenda Chroman, MD   Assessment & Plan: Visit Diagnoses:  1. S/P ankle fusion     Plan: Post left ankle fusion now more than 3 months out.  He is using Tylenol continue to ambulate with a cane and has poor vision.  Each month is less sore he still concerned that he has some swelling and asked to return in 3 months for recheck and repeat x-rays.  On return we can obtain a lateral x-ray with ankle in maximum dorsiflexion and maximal plantarflexion and then also AP x-ray.  Follow-Up Instructions: Return in about 3 months (around 08/26/2018).   Orders:  Orders Placed This Encounter  Procedures  . XR Ankle 2 Views Left   No orders of the defined types were placed in this encounter.     Procedures: No procedures performed   Clinical Data: No additional findings.   Subjective: Chief Complaint  Patient presents with  . Left Ankle - Follow-up    01/12/18 Left Ankle Fusion    HPI post left ankle fusion for posttraumatic arthritis 01/12/2018.  Patient still has swelling there is no drainage.  He uses lotion for his dry skin.  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  He is gradually progressed from the wheelchair to the walker and out of the cane.  He is using Tylenol for pain.  Review of Systems 14 point update unchanged from surgery mentioned above.   Objective: Vital Signs: BP 128/75   Pulse 76   Ht 5\' 7"  (1.702 m)   Wt 158 lb (71.7 kg)   BMI 24.75 kg/m   Physical Exam Constitutional:      Appearance: He is well-developed.  HENT:     Head: Normocephalic and atraumatic.  Eyes:     Pupils: Pupils are equal, round, and reactive to light.  Neck:     Thyroid: No thyromegaly.     Trachea: No tracheal  deviation.  Cardiovascular:     Rate and Rhythm: Normal rate.  Pulmonary:     Effort: Pulmonary effort is normal.     Breath sounds: No wheezing.  Abdominal:     General: Bowel sounds are normal.     Palpations: Abdomen is soft.  Skin:    General: Skin is warm and dry.     Capillary Refill: Capillary refill takes less than 2 seconds.  Neurological:     Mental Status: He is alert and oriented to person, place, and time.  Psychiatric:        Behavior: Behavior normal.        Thought Content: Thought content normal.        Judgment: Judgment normal.     Ortho Exam patient has edema of the left foot and ankle.  All incisions are healed no drainage.  Plantar surface of his foot looks good.  He has subtalar motion but does not appear to have ankle motion. Specialty Comments:  No specialty comments available.  Imaging: Xr Ankle 2 Views Left  Result Date: 05/26/2018 AP and lateral left ankle obtained and  reviewed.  This shows intramedullary rod with fixation and fusion of ankle.  There is progressive bone incorporation no evidence of screw breakage or loosening. Impression: Post ankle fusion with progressive bone incorporation.    PMFS History: Patient Active Problem List   Diagnosis Date Noted  . S/P ankle fusion 01/27/2018  . Fracture of left ankle 01/07/2018  . 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 Open left ankle fracture 10/05/2017  . Trimalleolar fracture, left, open type I or II, with nonunion, subsequent encounter 10/05/2017  . PNA (pneumonia) 04/29/2017  . Esophageal dysphagia 03/31/2017  . Abnormal esophagram 03/31/2017  . Diabetes ([address]) 03/09/2017  . Vitamin B deficiency 03/09/2017  . High cholesterol   . Hypertension   . GERD (gastroesophageal reflux disease)    Past Medical History:  Diagnosis Date  . Anxiety    "about all my life" (01/07/2018)  . Arthritis    "shoulders, back, hips" (01/07/2018)  . CAP (community acquired  pneumonia) 04/2017  . Chronic back pain    "upper and lower" (01/07/2018)  . 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 Depression   . GERD (gastroesophageal reflux disease)   . High cholesterol   . History of kidney stones   . Hypertension   . Legally blind   . Skin cancer    "frozen off my face, arms" (01/07/2018)  . Type II diabetes mellitus ([place])   . Vitamin B deficiency 03/09/2017    Family History  Problem Relation Age of Onset  . Diabetes Mother   . Heart attack Father   . Diabetes Sister   . Diabetes Brother     Past Surgical History:  Procedure Laterality Date  . ANKLE FUSION Left 01/12/2018   Procedure: LEFT ANKLE REMOVAL OF EXTERNAL FIXATOR, ANKLE FUSION WITH PHOENIX NAIL;  Surgeon: [name], MD;  Location: [facility];  Service: Orthopedics;  Laterality: Left;  . ANKLE HARDWARE REMOVAL Left 01/07/2018   REMOVAL OF HARDWARE, TAKE DOWN OF NONUNION, INSERTION OF ANTIBIOTC BEADS/notes 01/07/2018  . BIOPSY  04/23/2017   Procedure: BIOPSY;  Surgeon: Rogene Houston, MD;  [facility];  Service: Endoscopy;;  gastric  . CATARACT EXTRACTION W/ INTRAOCULAR LENS  IMPLANT, BILATERAL Bilateral   . ESOPHAGEAL DILATION N/A 04/23/2017   Procedure: ESOPHAGEAL DILATION;  Surgeon: Rogene Houston, MD;  [facility];  Service: Endoscopy;  Laterality: N/A;  . ESOPHAGOGASTRODUODENOSCOPY N/A 04/23/2017   Procedure: ESOPHAGOGASTRODUODENOSCOPY (EGD);  Surgeon: Rogene Houston, MD;  [facility];  Service: Endoscopy;  Laterality: N/A;  9:55-moved to 1040 per Lelon Frohlich  . EXTERNAL FIXATION LEG Left 01/07/2018   EXTERNAL FIXATION LEG, TIBIA TO CALCANEUS/notes 01/07/2018  . EXTERNAL FIXATION LEG Left 01/07/2018   Procedure: EXTERNAL FIXATION LEG, TIBIA TO CALCANEUS;  Surgeon: [name], MD;  Location: [facility];  Service: Orthopedics;  Laterality: Left;  . EXTERNAL FIXATION REMOVAL Left 01/12/2018   Procedure: REMOVAL EXTERNAL FIXATION ANKLE;  Surgeon: [name], MD;  Location: [facility];  Service: Orthopedics;  Laterality: Left;  . FRACTURE SURGERY    . HARDWARE REMOVAL Left 01/07/2018   Procedure: LEFT ANKLE REMOVAL OF HARDWARE, TAKE DOWN OF NONUNION, INSERTION OF ANTIBIOTC BEADS;  Surgeon: [name], MD;  Location: [facility];  Service: Orthopedics;  Laterality: Left;  . I&D EXTREMITY Left 10/05/2017   Procedure: IRRIGATION AND DEBRIDEMENT QPEN ANKLE FRACTURE;  Surgeon: [name], MD;  Location: [facility];  Service:  Orthopedics;  Laterality: Left;  . INNER EAR SURGERY Right 1962   "scraped the bone; couldn't hear out of it; was infected"  . MULTIPLE TOOTH EXTRACTIONS  2018  . ORIF ANKLE FRACTURE Left 10/05/2017   Procedure: OPEN REDUCTION INTERNAL FIXATION TRIMALLEOLAR (ORIF)ANKLE FRACTURE;  Surgeon: [name], MD;  Location: [facility];  Service: Orthopedics;  Laterality: Left;  . WRIST FRACTURE SURGERY Left 2018   Social History   Occupational History  . Not on file  Tobacco Use  . Smoking status: Current Every Day Smoker    Packs/day: 0.75    Years: 62.00    Pack years: 46.50    Types: Cigarettes  . Smokeless tobacco: Never Used  Substance and Sexual Activity  . Alcohol use: Not Currently    Frequency: Never    Comment: 01/07/2018 "nothing since the 1990s"  . Drug use: Not Currently    Comment: 01/07/2018  "nothing since 1980s"  . Sexual activity: Not Currently

## 2018-05-31 DIAGNOSIS — E1140 Type 2 diabetes mellitus with diabetic neuropathy, unspecified: Secondary | ICD-10-CM | POA: Diagnosis not present

## 2018-05-31 DIAGNOSIS — H81393 Other peripheral vertigo, bilateral: Secondary | ICD-10-CM | POA: Diagnosis not present

## 2018-05-31 DIAGNOSIS — E1159 Type 2 diabetes mellitus with other circulatory complications: Secondary | ICD-10-CM | POA: Diagnosis not present

## 2018-06-06 DIAGNOSIS — L0291 Cutaneous abscess, unspecified: Secondary | ICD-10-CM | POA: Diagnosis not present

## 2018-06-06 DIAGNOSIS — L0100 Impetigo, unspecified: Secondary | ICD-10-CM | POA: Diagnosis not present

## 2018-06-13 DIAGNOSIS — M25532 Pain in left wrist: Secondary | ICD-10-CM | POA: Diagnosis not present

## 2018-06-13 DIAGNOSIS — M25559 Pain in unspecified hip: Secondary | ICD-10-CM | POA: Diagnosis not present

## 2018-06-13 DIAGNOSIS — M545 Low back pain: Secondary | ICD-10-CM | POA: Diagnosis not present

## 2018-06-13 DIAGNOSIS — M79662 Pain in left lower leg: Secondary | ICD-10-CM | POA: Diagnosis not present

## 2018-06-13 DIAGNOSIS — M5010 Cervical disc disorder with radiculopathy, unspecified cervical region: Secondary | ICD-10-CM | POA: Diagnosis not present

## 2018-06-27 ENCOUNTER — Telehealth (INDEPENDENT_AMBULATORY_CARE_PROVIDER_SITE_OTHER): Payer: Self-pay

## 2018-06-27 NOTE — Telephone Encounter (Signed)
pts daughter is requesting pts xrays be sent to Dr. Nona Dell office. Pt is seen in [place] and was told we would be able to send them. He is trying to get an appt there and the doctor needs to review the xrays before he will agree to see the pt.

## 2018-06-27 NOTE — Telephone Encounter (Signed)
Patient wanted to pick CD up at [facility] office, CD made and given to [facility] to take with her to [place] office on Thursday. Patient called and notified, said he would pick up on Thursday.

## 2018-06-30 DIAGNOSIS — E7800 Pure hypercholesterolemia, unspecified: Secondary | ICD-10-CM | POA: Diagnosis not present

## 2018-06-30 DIAGNOSIS — I10 Essential (primary) hypertension: Secondary | ICD-10-CM | POA: Diagnosis not present

## 2018-06-30 DIAGNOSIS — E119 Type 2 diabetes mellitus without complications: Secondary | ICD-10-CM | POA: Diagnosis not present

## 2018-07-04 ENCOUNTER — Telehealth (INDEPENDENT_AMBULATORY_CARE_PROVIDER_SITE_OTHER): Payer: Self-pay | Admitting: Orthopaedic Surgery

## 2018-07-04 NOTE — Telephone Encounter (Signed)
I received vm from [place], pts daughter. I called her back and lm for her to [facility]

## 2018-07-06 DIAGNOSIS — Z6826 Body mass index (BMI) 26.0-26.9, adult: Secondary | ICD-10-CM | POA: Diagnosis not present

## 2018-07-06 DIAGNOSIS — E1165 Type 2 diabetes mellitus with hyperglycemia: Secondary | ICD-10-CM | POA: Diagnosis not present

## 2018-07-06 DIAGNOSIS — I10 Essential (primary) hypertension: Secondary | ICD-10-CM | POA: Diagnosis not present

## 2018-07-06 DIAGNOSIS — Z299 Encounter for prophylactic measures, unspecified: Secondary | ICD-10-CM | POA: Diagnosis not present

## 2018-07-06 DIAGNOSIS — F17210 Nicotine dependence, cigarettes, uncomplicated: Secondary | ICD-10-CM | POA: Diagnosis not present

## 2018-07-06 DIAGNOSIS — R42 Dizziness and giddiness: Secondary | ICD-10-CM | POA: Diagnosis not present

## 2018-07-19 DIAGNOSIS — J069 Acute upper respiratory infection, unspecified: Secondary | ICD-10-CM | POA: Diagnosis not present

## 2018-07-19 DIAGNOSIS — E11319 Type 2 diabetes mellitus with unspecified diabetic retinopathy without macular edema: Secondary | ICD-10-CM | POA: Diagnosis not present

## 2018-07-19 DIAGNOSIS — Z299 Encounter for prophylactic measures, unspecified: Secondary | ICD-10-CM | POA: Diagnosis not present

## 2018-07-19 DIAGNOSIS — I10 Essential (primary) hypertension: Secondary | ICD-10-CM | POA: Diagnosis not present

## 2018-07-19 DIAGNOSIS — E1142 Type 2 diabetes mellitus with diabetic polyneuropathy: Secondary | ICD-10-CM | POA: Diagnosis not present

## 2018-07-19 DIAGNOSIS — E1165 Type 2 diabetes mellitus with hyperglycemia: Secondary | ICD-10-CM | POA: Diagnosis not present

## 2018-07-19 DIAGNOSIS — Z6826 Body mass index (BMI) 26.0-26.9, adult: Secondary | ICD-10-CM | POA: Diagnosis not present

## 2018-07-28 DIAGNOSIS — I10 Essential (primary) hypertension: Secondary | ICD-10-CM | POA: Diagnosis not present

## 2018-07-28 DIAGNOSIS — E7800 Pure hypercholesterolemia, unspecified: Secondary | ICD-10-CM | POA: Diagnosis not present

## 2018-07-28 DIAGNOSIS — E119 Type 2 diabetes mellitus without complications: Secondary | ICD-10-CM | POA: Diagnosis not present

## 2018-08-09 DIAGNOSIS — M79662 Pain in left lower leg: Secondary | ICD-10-CM | POA: Diagnosis not present

## 2018-08-09 DIAGNOSIS — M5010 Cervical disc disorder with radiculopathy, unspecified cervical region: Secondary | ICD-10-CM | POA: Diagnosis not present

## 2018-08-09 DIAGNOSIS — M25532 Pain in left wrist: Secondary | ICD-10-CM | POA: Diagnosis not present

## 2018-08-09 DIAGNOSIS — M25559 Pain in unspecified hip: Secondary | ICD-10-CM | POA: Diagnosis not present

## 2018-08-18 ENCOUNTER — Telehealth: Payer: Self-pay | Admitting: Radiology

## 2018-08-18 ENCOUNTER — Ambulatory Visit (INDEPENDENT_AMBULATORY_CARE_PROVIDER_SITE_OTHER): Payer: Medicare Other | Admitting: Orthopaedic Surgery

## 2018-08-18 ENCOUNTER — Ambulatory Visit (INDEPENDENT_AMBULATORY_CARE_PROVIDER_SITE_OTHER): Payer: Medicare Other

## 2018-08-18 ENCOUNTER — Encounter: Payer: Self-pay | Admitting: Orthopaedic Surgery

## 2018-08-18 VITALS — Ht 67.0 in | Wt 164.0 lb

## 2018-08-18 DIAGNOSIS — M25572 Pain in left ankle and joints of left foot: Secondary | ICD-10-CM

## 2018-08-18 MED ORDER — ZOLPIDEM TARTRATE ER 6.25 MG PO TBCR: 6.2500 mg | EXTENDED_RELEASE_TABLET | Freq: Every evening | ORAL | 0 refills | Status: DC | PRN

## 2018-08-18 NOTE — Telephone Encounter (Signed)
appt made for ABI's at [facility] tomorrow. Appt made with Dr. Sharol Given on 08/25/2018.  I have CD of images taken in [place] office to give to Dr. Sharol Given for appointment.

## 2018-08-18 NOTE — Progress Notes (Signed)
Office Visit Note   Patient: Jason Woods           Date of Birth: 1945-07-19           MRN: [id number] Visit Date: 08/18/2018              Requested by: Glenda Chroman, MD [place], [place] [address] PCP: Glenda Chroman, MD   Assessment & Plan: Visit Diagnoses:  1. Pain in left ankle and joints of left foot     Plan: Patient appears to have loose screws in the Phoenix nail in the talus and calcaneus.  Ankle fusion does not appear solid.  We will set him up for some ABIs and then he can follow-up with Dr. Sharol Given for discussion of possible revision surgery.  Patient requested more pain medication I discussed in the means in a pain contract also on his current medication additional narcotics cannot be given.  He has for medicine for sleep Ambien 1 week supply given no refills.  He will obtain his ABIs can follow-up with Dr. Sharol Given in the [place] office.  Dr. Sharol Given can review his ABIs and decide if he is possibly candidate for repeat surgery with attempt to get his ankle solid or just remove hardware.  Follow-Up Instructions: No follow-ups on file.   Orders:  Orders Placed This Encounter  Procedures  . XR Ankle Complete Left   Meds ordered this encounter  Medications  . zolpidem (AMBIEN CR) 6.25 MG CR tablet    Sig: Take 1 tablet (6.25 mg total) by mouth at bedtime as needed for sleep.    Dispense:  10 tablet    Refill:  0      Procedures: No procedures performed   Clinical Data: No additional findings.   Subjective: Chief Complaint  Patient presents with  . Left Ankle - Pain    01/12/18 Left Ankle Fusion with Phoenix Nail    HPI 73 year old male returns for follow-up after trimalleolar left ankle fracture initially treated with irrigation and debridement with bimalleolar ORIF and syndesmotic screw on 10/05/2017.  Patient had syndesmotic screw removal at 10 weeks and then had loss of fixation of his ankle with loose screws.  He underwent takedown nonunion removal  of hardware placement of absorbable beads on 01/07/2018.  He returned to the operating room and underwent a retrograde Phoenix nail on 01/12/2018.  His ankle has remained straight he is gradually progressive weightbearing and started having pain in his foot with swelling in the ankle.  No drainage no fever or chills.  Patient is a long-term smoker also has diabetes.  Patient had a long history of taking pain medication currently on Subutex and a pain management contract.  He ambulates during the day stops and elevates his foot.  Past history of pneumonia.  Review of Systems positive for diabetes high cholesterol hypertension long-term smoker.  3 ankle surgeries as described above.  History of GERD.  Chronic pain management.  Otherwise negative as pertains to HPI.   Objective: Vital Signs: Ht 5\' 7"  (1.702 m)   Wt 164 lb (74.4 kg)   BMI 25.69 kg/m   Physical Exam Constitutional:      Appearance: He is well-developed.  HENT:     Head: Normocephalic and atraumatic.  Eyes:     Pupils: Pupils are equal, round, and reactive to light.  Neck:     Thyroid: No thyromegaly.     Trachea: No tracheal deviation.  Cardiovascular:  Rate and Rhythm: Normal rate.  Pulmonary:     Effort: Pulmonary effort is normal.     Breath sounds: No wheezing.  Abdominal:     General: Bowel sounds are normal.     Palpations: Abdomen is soft.  Skin:    General: Skin is warm and dry.     Capillary Refill: Capillary refill takes less than 2 seconds.  Neurological:     Mental Status: He is alert and oriented to person, place, and time.  Psychiatric:        Behavior: Behavior normal.        Thought Content: Thought content normal.        Judgment: Judgment normal.     Ortho Exam patient is range of motion of his ankle with some discomfort.  Lateral medial incisions are healed.  Capillary refill present no plantar foot lesions.  Specialty Comments:  No specialty comments available.  Imaging: Xr Ankle  Complete Left  Result Date: 08/18/2018 Three-view x-rays left ankle obtained and reviewed.  This shows retrograde nail with screw loosening in the calcaneus and talus.  There is incomplete fusion of the ankle joint. 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    PMFS History: Patient Active Problem List   Diagnosis Date Noted  . S/P ankle fusion 01/27/2018  . Fracture of left ankle 01/07/2018  . 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 Open left ankle fracture 10/05/2017  . Trimalleolar fracture, left, open type I or II, with nonunion, subsequent encounter 10/05/2017  . PNA (pneumonia) 04/29/2017  . Esophageal dysphagia 03/31/2017  . Abnormal esophagram 03/31/2017  . Diabetes ([place]) 03/09/2017  . Vitamin B deficiency 03/09/2017  . High cholesterol   . Hypertension   . GERD (gastroesophageal reflux disease)    Past Medical History:  Diagnosis Date  . Anxiety    "about all my life" (01/07/2018)  . Arthritis    "shoulders, back, hips" (01/07/2018)  . CAP (community acquired pneumonia) 04/2017  . Chronic back pain    "upper and lower" (01/07/2018)  . 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 Depression   . GERD (gastroesophageal reflux disease)   . High cholesterol   . History of kidney stones   . Hypertension   . Legally blind   . Skin cancer    "frozen off my face, arms" (01/07/2018)  . Type II diabetes mellitus ([place])   . Vitamin B deficiency 03/09/2017    Family History  Problem Relation Age of Onset  . Diabetes Mother   . Heart attack Father   . Diabetes Sister   . Diabetes Brother     Past Surgical History:  Procedure Laterality Date  . ANKLE FUSION Left 01/12/2018   Procedure: LEFT ANKLE REMOVAL OF EXTERNAL FIXATOR, ANKLE FUSION WITH PHOENIX NAIL;  Surgeon: [name], MD;  Location: [facility];  Service: Orthopedics;  Laterality: Left;  . ANKLE HARDWARE REMOVAL Left 01/07/2018    REMOVAL OF HARDWARE, TAKE DOWN OF NONUNION, INSERTION OF ANTIBIOTC BEADS/notes 01/07/2018  . BIOPSY  04/23/2017   Procedure: BIOPSY;  Surgeon: Rogene Houston, MD;  [facility];  Service: Endoscopy;;  gastric  . CATARACT EXTRACTION W/ INTRAOCULAR LENS  IMPLANT, BILATERAL Bilateral   . ESOPHAGEAL DILATION N/A 04/23/2017   Procedure: ESOPHAGEAL DILATION;  Surgeon: Rogene Houston, MD;  [facility];  Service: Endoscopy;  Laterality: N/A;  . ESOPHAGOGASTRODUODENOSCOPY N/A 04/23/2017  Procedure: ESOPHAGOGASTRODUODENOSCOPY (EGD);  Surgeon: Rogene Houston, MD;  [facility];  Service: Endoscopy;  Laterality: N/A;  9:55-moved to 1040 per Lelon Frohlich  . EXTERNAL FIXATION LEG Left 01/07/2018   EXTERNAL FIXATION LEG, TIBIA TO CALCANEUS/notes 01/07/2018  . EXTERNAL FIXATION LEG Left 01/07/2018   Procedure: EXTERNAL FIXATION LEG, TIBIA TO CALCANEUS;  Surgeon: [name], MD;  Location: [facility];  Service: Orthopedics;  Laterality: Left;  . EXTERNAL FIXATION REMOVAL Left 01/12/2018   Procedure: REMOVAL EXTERNAL FIXATION ANKLE;  Surgeon: [name], MD;  Location: [facility];  Service: Orthopedics;  Laterality: Left;  . FRACTURE SURGERY    . HARDWARE REMOVAL Left 01/07/2018   Procedure: LEFT ANKLE REMOVAL OF HARDWARE, TAKE DOWN OF NONUNION, INSERTION OF ANTIBIOTC BEADS;  Surgeon: [name], MD;  Location: [facility];  Service: Orthopedics;  Laterality: Left;  . I&D EXTREMITY Left 10/05/2017   Procedure: IRRIGATION AND DEBRIDEMENT QPEN ANKLE FRACTURE;  Surgeon: [name], MD;  Location: [facility];  Service: Orthopedics;  Laterality: Left;  . INNER EAR SURGERY Right 1962   "scraped the bone; couldn't hear out of it; was infected"  . MULTIPLE TOOTH EXTRACTIONS  2018  . ORIF ANKLE FRACTURE Left 10/05/2017   Procedure: OPEN REDUCTION INTERNAL FIXATION TRIMALLEOLAR (ORIF)ANKLE FRACTURE;  Surgeon: [name], MD;  Location: [facility];  Service: Orthopedics;  Laterality: Left;  . WRIST FRACTURE  SURGERY Left 2018   Social History   Occupational History  . Not on file  Tobacco Use  . Smoking status: Current Every Day Smoker    Packs/day: 0.75    Years: 62.00    Pack years: 46.50    Types: Cigarettes  . Smokeless tobacco: Never Used  Substance and Sexual Activity  . Alcohol use: Not Currently    Frequency: Never    Comment: 01/07/2018 "nothing since the 1990s"  . Drug use: Not Currently    Comment: 01/07/2018  "nothing since 1980s"  . Sexual activity: Not Currently

## 2018-08-18 NOTE — Telephone Encounter (Signed)
Patient will need appointment with Dr. Sharol Given once ABI's have been completed. ABI's ordered at [facility].

## 2018-08-19 ENCOUNTER — Encounter: Payer: Self-pay | Admitting: Orthopedic Surgery

## 2018-08-19 DIAGNOSIS — I739 Peripheral vascular disease, unspecified: Secondary | ICD-10-CM | POA: Diagnosis not present

## 2018-08-22 DIAGNOSIS — M25572 Pain in left ankle and joints of left foot: Secondary | ICD-10-CM | POA: Diagnosis not present

## 2018-08-22 DIAGNOSIS — Z978 Presence of other specified devices: Secondary | ICD-10-CM | POA: Diagnosis not present

## 2018-08-22 DIAGNOSIS — S82225N Nondisplaced transverse fracture of shaft of left tibia, subsequent encounter for open fracture type IIIA, IIIB, or IIIC with nonunion: Secondary | ICD-10-CM | POA: Diagnosis not present

## 2018-08-25 ENCOUNTER — Ambulatory Visit: Payer: Medicare Other | Admitting: Orthopedic Surgery

## 2018-08-25 ENCOUNTER — Ambulatory Visit: Payer: Medicare Other | Admitting: Orthopaedic Surgery

## 2018-08-25 DIAGNOSIS — E119 Type 2 diabetes mellitus without complications: Secondary | ICD-10-CM | POA: Diagnosis not present

## 2018-08-25 DIAGNOSIS — E7800 Pure hypercholesterolemia, unspecified: Secondary | ICD-10-CM | POA: Diagnosis not present

## 2018-08-25 DIAGNOSIS — I10 Essential (primary) hypertension: Secondary | ICD-10-CM | POA: Diagnosis not present

## 2018-08-29 ENCOUNTER — Other Ambulatory Visit: Payer: Self-pay | Admitting: Orthopedic Surgery

## 2018-08-29 DIAGNOSIS — S82225N Nondisplaced transverse fracture of shaft of left tibia, subsequent encounter for open fracture type IIIA, IIIB, or IIIC with nonunion: Secondary | ICD-10-CM

## 2018-09-01 DIAGNOSIS — S82225N Nondisplaced transverse fracture of shaft of left tibia, subsequent encounter for open fracture type IIIA, IIIB, or IIIC with nonunion: Secondary | ICD-10-CM | POA: Diagnosis not present

## 2018-09-01 DIAGNOSIS — S92102K Unspecified fracture of left talus, subsequent encounter for fracture with nonunion: Secondary | ICD-10-CM | POA: Diagnosis not present

## 2018-09-01 DIAGNOSIS — M85872 Other specified disorders of bone density and structure, left ankle and foot: Secondary | ICD-10-CM | POA: Diagnosis not present

## 2018-09-05 DIAGNOSIS — Z978 Presence of other specified devices: Secondary | ICD-10-CM | POA: Diagnosis not present

## 2018-09-05 DIAGNOSIS — M868X7 Other osteomyelitis, ankle and foot: Secondary | ICD-10-CM | POA: Diagnosis not present

## 2018-09-05 DIAGNOSIS — M869 Osteomyelitis, unspecified: Secondary | ICD-10-CM | POA: Diagnosis not present

## 2018-09-05 DIAGNOSIS — S8265XK Nondisplaced fracture of lateral malleolus of left fibula, subsequent encounter for closed fracture with nonunion: Secondary | ICD-10-CM | POA: Diagnosis not present

## 2018-09-12 DIAGNOSIS — F320 Major depressive disorder, single episode, mild: Secondary | ICD-10-CM | POA: Diagnosis not present

## 2018-09-13 DIAGNOSIS — H40001 Preglaucoma, unspecified, right eye: Secondary | ICD-10-CM | POA: Diagnosis not present

## 2018-09-13 DIAGNOSIS — Z961 Presence of intraocular lens: Secondary | ICD-10-CM | POA: Diagnosis not present

## 2018-09-13 DIAGNOSIS — E113513 Type 2 diabetes mellitus with proliferative diabetic retinopathy with macular edema, bilateral: Secondary | ICD-10-CM | POA: Diagnosis not present

## 2018-09-13 DIAGNOSIS — D3132 Benign neoplasm of left choroid: Secondary | ICD-10-CM | POA: Diagnosis not present

## 2018-09-20 DIAGNOSIS — M25572 Pain in left ankle and joints of left foot: Secondary | ICD-10-CM | POA: Diagnosis not present

## 2018-09-22 DIAGNOSIS — I10 Essential (primary) hypertension: Secondary | ICD-10-CM | POA: Diagnosis not present

## 2018-09-22 DIAGNOSIS — E7800 Pure hypercholesterolemia, unspecified: Secondary | ICD-10-CM | POA: Diagnosis not present

## 2018-09-22 DIAGNOSIS — E119 Type 2 diabetes mellitus without complications: Secondary | ICD-10-CM | POA: Diagnosis not present

## 2018-10-12 ENCOUNTER — Other Ambulatory Visit: Payer: Self-pay | Admitting: Orthopedic Surgery

## 2018-10-12 DIAGNOSIS — S8292XA Unspecified fracture of left lower leg, initial encounter for closed fracture: Secondary | ICD-10-CM | POA: Diagnosis not present

## 2018-10-12 DIAGNOSIS — S92102K Unspecified fracture of left talus, subsequent encounter for fracture with nonunion: Secondary | ICD-10-CM | POA: Diagnosis not present

## 2018-10-12 DIAGNOSIS — S82892A Other fracture of left lower leg, initial encounter for closed fracture: Secondary | ICD-10-CM

## 2018-10-12 DIAGNOSIS — E119 Type 2 diabetes mellitus without complications: Secondary | ICD-10-CM | POA: Diagnosis not present

## 2018-10-18 DIAGNOSIS — M79662 Pain in left lower leg: Secondary | ICD-10-CM | POA: Diagnosis not present

## 2018-10-18 DIAGNOSIS — M5010 Cervical disc disorder with radiculopathy, unspecified cervical region: Secondary | ICD-10-CM | POA: Diagnosis not present

## 2018-10-18 DIAGNOSIS — M25532 Pain in left wrist: Secondary | ICD-10-CM | POA: Diagnosis not present

## 2018-10-18 DIAGNOSIS — M25559 Pain in unspecified hip: Secondary | ICD-10-CM | POA: Diagnosis not present

## 2018-10-24 ENCOUNTER — Ambulatory Visit
Admission: RE | Admit: 2018-10-24 | Discharge: 2018-10-24 | Disposition: A | Discharge status: Home / Self Care | Payer: Medicare Other | Source: Ambulatory Visit | Attending: Orthopedic Surgery | Admitting: Orthopedic Surgery

## 2018-10-24 DIAGNOSIS — S82892A Other fracture of left lower leg, initial encounter for closed fracture: Secondary | ICD-10-CM

## 2018-10-24 DIAGNOSIS — T84117A Breakdown (mechanical) of internal fixation device of bone of left lower leg, initial encounter: Secondary | ICD-10-CM | POA: Diagnosis not present

## 2018-10-27 DIAGNOSIS — E7800 Pure hypercholesterolemia, unspecified: Secondary | ICD-10-CM | POA: Diagnosis not present

## 2018-10-27 DIAGNOSIS — I10 Essential (primary) hypertension: Secondary | ICD-10-CM | POA: Diagnosis not present

## 2018-10-27 DIAGNOSIS — E119 Type 2 diabetes mellitus without complications: Secondary | ICD-10-CM | POA: Diagnosis not present

## 2018-10-28 DIAGNOSIS — H548 Legal blindness, as defined in USA: Secondary | ICD-10-CM | POA: Diagnosis not present

## 2018-10-28 DIAGNOSIS — Z299 Encounter for prophylactic measures, unspecified: Secondary | ICD-10-CM | POA: Diagnosis not present

## 2018-10-28 DIAGNOSIS — E1165 Type 2 diabetes mellitus with hyperglycemia: Secondary | ICD-10-CM | POA: Diagnosis not present

## 2018-10-28 DIAGNOSIS — E1142 Type 2 diabetes mellitus with diabetic polyneuropathy: Secondary | ICD-10-CM | POA: Diagnosis not present

## 2018-10-28 DIAGNOSIS — I10 Essential (primary) hypertension: Secondary | ICD-10-CM | POA: Diagnosis not present

## 2018-10-28 DIAGNOSIS — E1139 Type 2 diabetes mellitus with other diabetic ophthalmic complication: Secondary | ICD-10-CM | POA: Diagnosis not present

## 2018-10-28 DIAGNOSIS — Z6825 Body mass index (BMI) 25.0-25.9, adult: Secondary | ICD-10-CM | POA: Diagnosis not present

## 2018-11-09 DIAGNOSIS — S92102K Unspecified fracture of left talus, subsequent encounter for fracture with nonunion: Secondary | ICD-10-CM | POA: Diagnosis not present

## 2018-11-17 ENCOUNTER — Ambulatory Visit (INDEPENDENT_AMBULATORY_CARE_PROVIDER_SITE_OTHER): Payer: Medicare Other | Admitting: Otolaryngology

## 2018-11-17 DIAGNOSIS — H900 Conductive hearing loss, bilateral: Secondary | ICD-10-CM

## 2018-11-17 DIAGNOSIS — H6123 Impacted cerumen, bilateral: Secondary | ICD-10-CM | POA: Diagnosis not present

## 2018-11-22 DIAGNOSIS — M86172 Other acute osteomyelitis, left ankle and foot: Secondary | ICD-10-CM | POA: Insufficient documentation

## 2018-11-24 DIAGNOSIS — E119 Type 2 diabetes mellitus without complications: Secondary | ICD-10-CM | POA: Diagnosis not present

## 2018-11-24 DIAGNOSIS — E7800 Pure hypercholesterolemia, unspecified: Secondary | ICD-10-CM | POA: Diagnosis not present

## 2018-11-24 DIAGNOSIS — I10 Essential (primary) hypertension: Secondary | ICD-10-CM | POA: Diagnosis not present

## 2018-11-25 ENCOUNTER — Other Ambulatory Visit (HOSPITAL_COMMUNITY): Payer: Self-pay | Admitting: Orthopedic Surgery

## 2018-11-25 DIAGNOSIS — Z299 Encounter for prophylactic measures, unspecified: Secondary | ICD-10-CM | POA: Diagnosis not present

## 2018-11-25 DIAGNOSIS — Z6826 Body mass index (BMI) 26.0-26.9, adult: Secondary | ICD-10-CM | POA: Diagnosis not present

## 2018-11-25 DIAGNOSIS — M868X7 Other osteomyelitis, ankle and foot: Secondary | ICD-10-CM | POA: Diagnosis not present

## 2018-11-25 DIAGNOSIS — L089 Local infection of the skin and subcutaneous tissue, unspecified: Secondary | ICD-10-CM | POA: Diagnosis not present

## 2018-11-25 DIAGNOSIS — I10 Essential (primary) hypertension: Secondary | ICD-10-CM | POA: Diagnosis not present

## 2018-11-25 DIAGNOSIS — M25572 Pain in left ankle and joints of left foot: Secondary | ICD-10-CM | POA: Diagnosis not present

## 2018-11-25 DIAGNOSIS — Z713 Dietary counseling and surveillance: Secondary | ICD-10-CM | POA: Diagnosis not present

## 2018-11-25 DIAGNOSIS — M19072 Primary osteoarthritis, left ankle and foot: Secondary | ICD-10-CM | POA: Diagnosis not present

## 2018-11-25 DIAGNOSIS — M869 Osteomyelitis, unspecified: Secondary | ICD-10-CM | POA: Diagnosis not present

## 2018-11-27 ENCOUNTER — Telehealth: Payer: Self-pay | Admitting: *Deleted

## 2018-11-27 NOTE — Telephone Encounter (Signed)
Pt called asking how to get the covid-19 test done before his surgery on Thurs. Advised that he can go to one of the [facility] sites. He lives in [place] and will be going to the [employer] building for this test. He is advised that if he can not get the test done to call back so we can advise him. He voiced understanding.

## 2018-11-29 ENCOUNTER — Other Ambulatory Visit (HOSPITAL_COMMUNITY)
Admission: RE | Admit: 2018-11-29 | Discharge: 2018-11-29 | Disposition: A | Discharge status: Home / Self Care | Payer: Medicare Other | Source: Ambulatory Visit | Attending: Orthopedic Surgery | Admitting: Orthopedic Surgery

## 2018-11-29 ENCOUNTER — Other Ambulatory Visit: Payer: Self-pay

## 2018-11-29 DIAGNOSIS — Z01812 Encounter for preprocedural laboratory examination: Secondary | ICD-10-CM | POA: Insufficient documentation

## 2018-11-29 DIAGNOSIS — Z20828 Contact with and (suspected) exposure to other viral communicable diseases: Secondary | ICD-10-CM | POA: Insufficient documentation

## 2018-11-30 ENCOUNTER — Encounter (HOSPITAL_COMMUNITY): Payer: Self-pay | Admitting: *Deleted

## 2018-11-30 ENCOUNTER — Other Ambulatory Visit: Payer: Self-pay

## 2018-11-30 LAB — SARS CORONAVIRUS 2 (TAT 6-24 HRS): SARS Coronavirus 2: NEGATIVE

## 2018-11-30 NOTE — Progress Notes (Signed)
Pt denies SOB, chest pain, and being under the care of a cardiologist. Pt stated that his PCP is  [facility]. Pt denies having a stress test, echo and cardiac cath. Pt denies having a chest x ray and EKG in the last year. Pt denies recent labs. Pt made aware to stop taking vitamins, fish oil, Subutex and herbal medications. Do not take any NSAIDs ie: Ibuprofen, Advil, Naproxen (Aleve), Motrin, BC and Goody Powder. Pt made nurse aware of morning medications that he takes. Pt made aware to not take Metformin DOS. Nurse made pt aware to take 4-5 units of Lantus insulin tonight ( half of usual 8-10 per pt ). Pt made aware to check BG every 2 hours prior to arrival to hospital on DOS. Pt made aware to take 8 units of Lantus insulin DOS ( half of usual 16 units per pt ) if CBG is > 70. Pt made aware to take no Lantus insulin if CBG is <70 and treat  CBG < 70 with 4 ounces of apple or cranberry juice, wait 15 minutes after intervention to recheck BG, if BG remains < 70, call Short Stay unit to speak with a nurse. Nurse left SS phone number on pt sister, Lelan Pons Regulatory affairs officer as requested. Pt verbalized understanding of all pre-op instructions.

## 2018-12-01 ENCOUNTER — Encounter (HOSPITAL_COMMUNITY)
Admission: RE | Disposition: A | Discharge status: Home Health | Payer: Self-pay | Source: Home / Self Care | Attending: Orthopedic Surgery

## 2018-12-01 ENCOUNTER — Inpatient Hospital Stay (HOSPITAL_COMMUNITY): Payer: Medicare Other | Admitting: Anesthesiology

## 2018-12-01 ENCOUNTER — Inpatient Hospital Stay (HOSPITAL_COMMUNITY)
Admission: RE | Admit: 2018-12-01 | Discharge: 2018-12-06 | DRG: 493 | Disposition: A | Discharge status: Home Health | Payer: Medicare Other | Attending: Orthopedic Surgery | Admitting: Orthopedic Surgery

## 2018-12-01 ENCOUNTER — Encounter (HOSPITAL_COMMUNITY): Payer: Self-pay | Admitting: Anesthesiology

## 2018-12-01 DIAGNOSIS — I10 Essential (primary) hypertension: Secondary | ICD-10-CM

## 2018-12-01 DIAGNOSIS — K219 Gastro-esophageal reflux disease without esophagitis: Secondary | ICD-10-CM

## 2018-12-01 DIAGNOSIS — S82852A Displaced trimalleolar fracture of left lower leg, initial encounter for closed fracture: Secondary | ICD-10-CM | POA: Diagnosis not present

## 2018-12-01 DIAGNOSIS — X58XXXD Exposure to other specified factors, subsequent encounter: Secondary | ICD-10-CM | POA: Diagnosis not present

## 2018-12-01 DIAGNOSIS — F419 Anxiety disorder, unspecified: Secondary | ICD-10-CM | POA: Diagnosis present

## 2018-12-01 DIAGNOSIS — K0889 Other specified disorders of teeth and supporting structures: Secondary | ICD-10-CM | POA: Diagnosis present

## 2018-12-01 DIAGNOSIS — Z20828 Contact with and (suspected) exposure to other viral communicable diseases: Secondary | ICD-10-CM | POA: Diagnosis not present

## 2018-12-01 DIAGNOSIS — Z85828 Personal history of other malignant neoplasm of skin: Secondary | ICD-10-CM | POA: Diagnosis not present

## 2018-12-01 DIAGNOSIS — Z888 Allergy status to other drugs, medicaments and biological substances status: Secondary | ICD-10-CM

## 2018-12-01 DIAGNOSIS — Z8701 Personal history of pneumonia (recurrent): Secondary | ICD-10-CM | POA: Diagnosis not present

## 2018-12-01 DIAGNOSIS — Z472 Encounter for removal of internal fixation device: Secondary | ICD-10-CM | POA: Diagnosis not present

## 2018-12-01 DIAGNOSIS — S82852K Displaced trimalleolar fracture of left lower leg, subsequent encounter for closed fracture with nonunion: Secondary | ICD-10-CM | POA: Diagnosis present

## 2018-12-01 DIAGNOSIS — S82851K Displaced trimalleolar fracture of right lower leg, subsequent encounter for closed fracture with nonunion: Secondary | ICD-10-CM

## 2018-12-01 DIAGNOSIS — Z833 Family history of diabetes mellitus: Secondary | ICD-10-CM

## 2018-12-01 DIAGNOSIS — E1169 Type 2 diabetes mellitus with other specified complication: Secondary | ICD-10-CM | POA: Diagnosis present

## 2018-12-01 DIAGNOSIS — M549 Dorsalgia, unspecified: Secondary | ICD-10-CM | POA: Diagnosis present

## 2018-12-01 DIAGNOSIS — T8469XA Infection and inflammatory reaction due to internal fixation device of other site, initial encounter: Secondary | ICD-10-CM | POA: Diagnosis not present

## 2018-12-01 DIAGNOSIS — M1990 Unspecified osteoarthritis, unspecified site: Secondary | ICD-10-CM | POA: Diagnosis not present

## 2018-12-01 DIAGNOSIS — H548 Legal blindness, as defined in USA: Secondary | ICD-10-CM | POA: Diagnosis present

## 2018-12-01 DIAGNOSIS — F17210 Nicotine dependence, cigarettes, uncomplicated: Secondary | ICD-10-CM | POA: Diagnosis present

## 2018-12-01 DIAGNOSIS — T84623A Infection and inflammatory reaction due to internal fixation device of left tibia, initial encounter: Secondary | ICD-10-CM | POA: Diagnosis not present

## 2018-12-01 DIAGNOSIS — W19XXXD Unspecified fall, subsequent encounter: Secondary | ICD-10-CM | POA: Diagnosis not present

## 2018-12-01 DIAGNOSIS — Z79899 Other long term (current) drug therapy: Secondary | ICD-10-CM

## 2018-12-01 DIAGNOSIS — Z886 Allergy status to analgesic agent status: Secondary | ICD-10-CM

## 2018-12-01 DIAGNOSIS — E785 Hyperlipidemia, unspecified: Secondary | ICD-10-CM | POA: Diagnosis not present

## 2018-12-01 DIAGNOSIS — E539 Vitamin B deficiency, unspecified: Secondary | ICD-10-CM | POA: Diagnosis present

## 2018-12-01 DIAGNOSIS — C4490 Unspecified malignant neoplasm of skin, unspecified: Secondary | ICD-10-CM | POA: Diagnosis not present

## 2018-12-01 DIAGNOSIS — Z8249 Family history of ischemic heart disease and other diseases of the circulatory system: Secondary | ICD-10-CM

## 2018-12-01 DIAGNOSIS — Z794 Long term (current) use of insulin: Secondary | ICD-10-CM

## 2018-12-01 DIAGNOSIS — F329 Major depressive disorder, single episode, unspecified: Secondary | ICD-10-CM | POA: Diagnosis present

## 2018-12-01 DIAGNOSIS — F418 Other specified anxiety disorders: Secondary | ICD-10-CM | POA: Diagnosis not present

## 2018-12-01 DIAGNOSIS — M869 Osteomyelitis, unspecified: Secondary | ICD-10-CM | POA: Diagnosis present

## 2018-12-01 DIAGNOSIS — E7800 Pure hypercholesterolemia, unspecified: Secondary | ICD-10-CM | POA: Diagnosis not present

## 2018-12-01 DIAGNOSIS — Y793 Surgical instruments, materials and orthopedic devices (including sutures) associated with adverse incidents: Secondary | ICD-10-CM | POA: Diagnosis present

## 2018-12-01 DIAGNOSIS — G8929 Other chronic pain: Secondary | ICD-10-CM | POA: Diagnosis present

## 2018-12-01 DIAGNOSIS — Z7951 Long term (current) use of inhaled steroids: Secondary | ICD-10-CM | POA: Diagnosis not present

## 2018-12-01 DIAGNOSIS — E119 Type 2 diabetes mellitus without complications: Secondary | ICD-10-CM

## 2018-12-01 DIAGNOSIS — Z981 Arthrodesis status: Secondary | ICD-10-CM

## 2018-12-01 DIAGNOSIS — Z9109 Other allergy status, other than to drugs and biological substances: Secondary | ICD-10-CM

## 2018-12-01 DIAGNOSIS — M960 Pseudarthrosis after fusion or arthrodesis: Secondary | ICD-10-CM | POA: Diagnosis not present

## 2018-12-01 HISTORY — PX: HARDWARE REMOVAL: SHX979

## 2018-12-01 LAB — CBC
HCT: 30.4 % — ABNORMAL LOW (ref 39.0–52.0)
Hemoglobin: 9.1 g/dL — ABNORMAL LOW (ref 13.0–17.0)
MCH: 22.7 pg — ABNORMAL LOW (ref 26.0–34.0)
MCHC: 29.9 g/dL — ABNORMAL LOW (ref 30.0–36.0)
MCV: 75.8 fL — ABNORMAL LOW (ref 80.0–100.0)
Platelets: 334 10*3/uL (ref 150–400)
RBC: 4.01 MIL/uL — ABNORMAL LOW (ref 4.22–5.81)
RDW: 16.5 % — ABNORMAL HIGH (ref 11.5–15.5)
WBC: 6.8 10*3/uL (ref 4.0–10.5)
nRBC: 0 % (ref 0.0–0.2)

## 2018-12-01 LAB — BASIC METABOLIC PANEL
Anion gap: 13 (ref 5–15)
BUN: 9 mg/dL (ref 8–23)
CO2: 26 mmol/L (ref 22–32)
Calcium: 8.8 mg/dL — ABNORMAL LOW (ref 8.9–10.3)
Chloride: 87 mmol/L — ABNORMAL LOW (ref 98–111)
Creatinine, Ser: 0.99 mg/dL (ref 0.61–1.24)
GFR calc Af Amer: >60 mL/min (ref >60)
GFR calc non Af Amer: >60 mL/min (ref >60)
Glucose, Bld: 97 mg/dL (ref 70–99)
Potassium: 3.8 mmol/L (ref 3.5–5.1)
Sodium: 126 mmol/L — ABNORMAL LOW (ref 135–145)

## 2018-12-01 LAB — CBC WITH DIFFERENTIAL/PLATELET
Abs Immature Granulocytes: 0.06 10*3/uL (ref 0.00–0.07)
Basophils Absolute: 0.1 10*3/uL (ref 0.0–0.1)
Basophils Relative: 1 %
Eosinophils Absolute: 0.1 10*3/uL (ref 0.0–0.5)
Eosinophils Relative: 1 %
HCT: 30.5 % — ABNORMAL LOW (ref 39.0–52.0)
Hemoglobin: 9.4 g/dL — ABNORMAL LOW (ref 13.0–17.0)
Immature Granulocytes: 1 %
Lymphocytes Relative: 11 %
Lymphs Abs: 1.2 10*3/uL (ref 0.7–4.0)
MCH: 23 pg — ABNORMAL LOW (ref 26.0–34.0)
MCHC: 30.8 g/dL (ref 30.0–36.0)
MCV: 74.6 fL — ABNORMAL LOW (ref 80.0–100.0)
Monocytes Absolute: 0.7 10*3/uL (ref 0.1–1.0)
Monocytes Relative: 7 %
Neutro Abs: 8.6 10*3/uL — ABNORMAL HIGH (ref 1.7–7.7)
Neutrophils Relative %: 79 %
Platelets: 342 10*3/uL (ref 150–400)
RBC: 4.09 MIL/uL — ABNORMAL LOW (ref 4.22–5.81)
RDW: 16.5 % — ABNORMAL HIGH (ref 11.5–15.5)
WBC: 10.7 10*3/uL — ABNORMAL HIGH (ref 4.0–10.5)
nRBC: 0 % (ref 0.0–0.2)

## 2018-12-01 LAB — C-REACTIVE PROTEIN: CRP: <0.8 mg/dL (ref <1.0)

## 2018-12-01 LAB — CREATININE, SERUM
Creatinine, Ser: 0.89 mg/dL (ref 0.61–1.24)
GFR calc Af Amer: >60 mL/min (ref >60)
GFR calc non Af Amer: >60 mL/min (ref >60)

## 2018-12-01 LAB — SEDIMENTATION RATE: Sed Rate: 6 mm/hr (ref 0–16)

## 2018-12-01 LAB — GLUCOSE, CAPILLARY
Glucose-Capillary: 103 mg/dL — ABNORMAL HIGH (ref 70–99)
Glucose-Capillary: 91 mg/dL (ref 70–99)
Glucose-Capillary: 106 mg/dL — ABNORMAL HIGH (ref 70–99)
Glucose-Capillary: 120 mg/dL — ABNORMAL HIGH (ref 70–99)
Glucose-Capillary: 223 mg/dL — ABNORMAL HIGH (ref 70–99)

## 2018-12-01 SURGERY — REMOVAL, HARDWARE
Anesthesia: General | Laterality: Left

## 2018-12-01 MED ORDER — HYDROXYZINE HCL 25 MG PO TABS
25.0000 mg | ORAL_TABLET | Freq: Every day | ORAL | Status: DC
  Administered 2018-12-01 – 2018-12-05 (×5): 25 mg via ORAL
  Filled 2018-12-01 (×5): qty 1

## 2018-12-01 MED ORDER — NICOTINE 14 MG/24HR TD PT24
14.0000 mg | MEDICATED_PATCH | TRANSDERMAL | Status: DC
  Administered 2018-12-01 – 2018-12-05 (×5): 14 mg via TRANSDERMAL
  Filled 2018-12-01 (×5): qty 1

## 2018-12-01 MED ORDER — ARTIFICIAL TEARS OPHTHALMIC OINT
TOPICAL_OINTMENT | OPHTHALMIC | Status: AC
  Filled 2018-12-01: qty 3.5

## 2018-12-01 MED ORDER — VANCOMYCIN HCL 1000 MG IV SOLR
INTRAVENOUS | Status: AC
  Filled 2018-12-01: qty 1000

## 2018-12-01 MED ORDER — FENTANYL CITRATE (PF) 100 MCG/2ML IJ SOLN
INTRAMUSCULAR | Status: AC
  Filled 2018-12-01: qty 2

## 2018-12-01 MED ORDER — VITAMIN D 25 MCG (1000 UNIT) PO TABS
2000.0000 [IU] | ORAL_TABLET | Freq: Every day | ORAL | Status: DC
  Administered 2018-12-01 – 2018-12-06 (×6): 2000 [IU] via ORAL
  Filled 2018-12-01 (×7): qty 2

## 2018-12-01 MED ORDER — VANCOMYCIN HCL 1000 MG IV SOLR
INTRAVENOUS | Status: DC | PRN
  Administered 2018-12-01: 1000 mg

## 2018-12-01 MED ORDER — METFORMIN HCL 850 MG PO TABS
850.0000 mg | ORAL_TABLET | Freq: Two times a day (BID) | ORAL | Status: DC
  Administered 2018-12-01 – 2018-12-06 (×10): 850 mg via ORAL
  Filled 2018-12-01 (×11): qty 1

## 2018-12-01 MED ORDER — PANTOPRAZOLE SODIUM 40 MG PO TBEC
40.0000 mg | DELAYED_RELEASE_TABLET | Freq: Every day | ORAL | Status: DC
  Administered 2018-12-02 – 2018-12-06 (×5): 40 mg via ORAL
  Filled 2018-12-01 (×6): qty 1

## 2018-12-01 MED ORDER — OXYCODONE HCL 5 MG PO TABS
10.0000 mg | ORAL_TABLET | ORAL | Status: DC | PRN
  Administered 2018-12-03 – 2018-12-06 (×7): 10 mg via ORAL
  Filled 2018-12-01 (×8): qty 2

## 2018-12-01 MED ORDER — ONDANSETRON HCL 4 MG/2ML IJ SOLN
INTRAMUSCULAR | Status: DC | PRN
  Administered 2018-12-01: 4 mg via INTRAVENOUS

## 2018-12-01 MED ORDER — MEPERIDINE HCL 25 MG/ML IJ SOLN: 6.2500 mg | INTRAMUSCULAR | Status: DC | PRN

## 2018-12-01 MED ORDER — METOCLOPRAMIDE HCL 5 MG/ML IJ SOLN: 5.0000 mg | Freq: Three times a day (TID) | INTRAMUSCULAR | Status: DC | PRN

## 2018-12-01 MED ORDER — ACETAMINOPHEN 500 MG PO TABS
1000.0000 mg | ORAL_TABLET | Freq: Four times a day (QID) | ORAL | Status: AC
  Administered 2018-12-01 – 2018-12-02 (×4): 1000 mg via ORAL
  Filled 2018-12-01 (×4): qty 2

## 2018-12-01 MED ORDER — SUCCINYLCHOLINE CHLORIDE 200 MG/10ML IV SOSY
PREFILLED_SYRINGE | INTRAVENOUS | Status: AC
  Filled 2018-12-01: qty 20

## 2018-12-01 MED ORDER — LIDOCAINE 2% (20 MG/ML) 5 ML SYRINGE
INTRAMUSCULAR | Status: DC | PRN
  Administered 2018-12-01: 80 mg via INTRAVENOUS

## 2018-12-01 MED ORDER — DOCUSATE SODIUM 100 MG PO CAPS
100.0000 mg | ORAL_CAPSULE | Freq: Two times a day (BID) | ORAL | Status: DC
  Administered 2018-12-02 – 2018-12-05 (×6): 100 mg via ORAL
  Filled 2018-12-01 (×10): qty 1

## 2018-12-01 MED ORDER — HYDROMORPHONE HCL 1 MG/ML IJ SOLN: 0.5000 mg | INTRAMUSCULAR | Status: DC | PRN

## 2018-12-01 MED ORDER — PROPOFOL 10 MG/ML IV BOLUS
INTRAVENOUS | Status: AC
  Filled 2018-12-01: qty 40

## 2018-12-01 MED ORDER — SENNA 8.6 MG PO TABS
1.0000 | ORAL_TABLET | Freq: Two times a day (BID) | ORAL | Status: DC
  Administered 2018-12-01 – 2018-12-05 (×6): 8.6 mg via ORAL
  Filled 2018-12-01 (×10): qty 1

## 2018-12-01 MED ORDER — ONDANSETRON HCL 4 MG/2ML IJ SOLN: 4.0000 mg | Freq: Once | INTRAMUSCULAR | Status: DC | PRN

## 2018-12-01 MED ORDER — VANCOMYCIN HCL 10 G IV SOLR
1500.0000 mg | INTRAVENOUS | Status: DC
  Administered 2018-12-02 – 2018-12-04 (×3): 1500 mg via INTRAVENOUS
  Filled 2018-12-01 (×4): qty 1500

## 2018-12-01 MED ORDER — BISACODYL 10 MG RE SUPP: 10.0000 mg | Freq: Every day | RECTAL | Status: DC | PRN

## 2018-12-01 MED ORDER — MAGNESIUM CITRATE PO SOLN: 1.0000 | Freq: Once | ORAL | Status: DC | PRN

## 2018-12-01 MED ORDER — FENTANYL CITRATE (PF) 100 MCG/2ML IJ SOLN
25.0000 ug | INTRAMUSCULAR | Status: DC | PRN
  Administered 2018-12-01 (×2): 50 ug via INTRAVENOUS

## 2018-12-01 MED ORDER — CEFAZOLIN SODIUM-DEXTROSE 2-3 GM-%(50ML) IV SOLR
INTRAVENOUS | Status: DC | PRN
  Administered 2018-12-01: 2 g via INTRAVENOUS

## 2018-12-01 MED ORDER — LACTATED RINGERS IV SOLN
INTRAVENOUS | Status: DC
  Administered 2018-12-01: 11:00:00 via INTRAVENOUS

## 2018-12-01 MED ORDER — FLUTICASONE PROPIONATE 50 MCG/ACT NA SUSP
2.0000 | Freq: Every day | NASAL | Status: DC
  Administered 2018-12-01 – 2018-12-06 (×6): 2 via NASAL
  Filled 2018-12-01: qty 16

## 2018-12-01 MED ORDER — METOPROLOL SUCCINATE ER 25 MG PO TB24
25.0000 mg | ORAL_TABLET | Freq: Every day | ORAL | Status: DC
  Administered 2018-12-02 – 2018-12-06 (×4): 25 mg via ORAL
  Filled 2018-12-01 (×6): qty 1

## 2018-12-01 MED ORDER — POLYETHYLENE GLYCOL 3350 17 G PO PACK: 17.0000 g | PACK | Freq: Every day | ORAL | Status: DC | PRN

## 2018-12-01 MED ORDER — VANCOMYCIN HCL IN DEXTROSE 750-5 MG/150ML-% IV SOLN
750.0000 mg | INTRAVENOUS | Status: AC
  Administered 2018-12-01: 21:00:00 750 mg via INTRAVENOUS
  Filled 2018-12-01: qty 150

## 2018-12-01 MED ORDER — B COMPLEX-C PO TABS
1.0000 | ORAL_TABLET | Freq: Every day | ORAL | Status: DC
  Administered 2018-12-01 – 2018-12-06 (×6): 1 via ORAL
  Filled 2018-12-01 (×6): qty 1

## 2018-12-01 MED ORDER — ONDANSETRON HCL 4 MG PO TABS
4.0000 mg | ORAL_TABLET | Freq: Four times a day (QID) | ORAL | Status: DC | PRN
  Administered 2018-12-02 (×2): 4 mg via ORAL
  Filled 2018-12-01 (×2): qty 1

## 2018-12-01 MED ORDER — LIDOCAINE 2% (20 MG/ML) 5 ML SYRINGE
INTRAMUSCULAR | Status: AC
  Filled 2018-12-01: qty 15

## 2018-12-01 MED ORDER — DEXAMETHASONE SODIUM PHOSPHATE 10 MG/ML IJ SOLN
INTRAMUSCULAR | Status: AC
  Filled 2018-12-01: qty 2

## 2018-12-01 MED ORDER — 0.9 % SODIUM CHLORIDE (POUR BTL) OPTIME
TOPICAL | Status: DC | PRN
  Administered 2018-12-01: 14:00:00 1000 mL

## 2018-12-01 MED ORDER — CEFAZOLIN SODIUM 1 G IJ SOLR
INTRAMUSCULAR | Status: AC
  Filled 2018-12-01: qty 40

## 2018-12-01 MED ORDER — ONDANSETRON HCL 4 MG/2ML IJ SOLN
INTRAMUSCULAR | Status: AC
  Filled 2018-12-01: qty 6

## 2018-12-01 MED ORDER — ETOMIDATE 2 MG/ML IV SOLN
INTRAVENOUS | Status: AC
  Filled 2018-12-01: qty 10

## 2018-12-01 MED ORDER — ONDANSETRON HCL 4 MG/2ML IJ SOLN: 4.0000 mg | Freq: Four times a day (QID) | INTRAMUSCULAR | Status: DC | PRN

## 2018-12-01 MED ORDER — BUPRENORPHINE HCL-NALOXONE HCL 8-2 MG SL SUBL
1.0000 | SUBLINGUAL_TABLET | Freq: Every day | SUBLINGUAL | Status: DC
  Administered 2018-12-01 – 2018-12-06 (×6): 1 via SUBLINGUAL
  Filled 2018-12-01 (×6): qty 1

## 2018-12-01 MED ORDER — OXYCODONE HCL 5 MG PO TABS
5.0000 mg | ORAL_TABLET | ORAL | Status: DC | PRN
  Administered 2018-12-01 – 2018-12-06 (×11): 5 mg via ORAL
  Filled 2018-12-01 (×12): qty 1

## 2018-12-01 MED ORDER — SODIUM CHLORIDE 0.9 % IV SOLN
INTRAVENOUS | Status: DC
  Administered 2018-12-01: 13:00:00 via INTRAVENOUS

## 2018-12-01 MED ORDER — SODIUM CHLORIDE 0.9 % IV SOLN
INTRAVENOUS | Status: DC | PRN
  Administered 2018-12-01: 30 ug/min via INTRAVENOUS

## 2018-12-01 MED ORDER — ENOXAPARIN SODIUM 40 MG/0.4ML ~~LOC~~ SOLN
40.0000 mg | SUBCUTANEOUS | Status: DC
  Administered 2018-12-02 – 2018-12-06 (×5): 40 mg via SUBCUTANEOUS
  Filled 2018-12-01 (×5): qty 0.4

## 2018-12-01 MED ORDER — ACETAMINOPHEN 10 MG/ML IV SOLN
INTRAVENOUS | Status: DC | PRN
  Administered 2018-12-01: 1000 mg via INTRAVENOUS

## 2018-12-01 MED ORDER — SODIUM CHLORIDE 0.9 % IV SOLN
INTRAVENOUS | Status: DC
  Administered 2018-12-01 – 2018-12-04 (×3): via INTRAVENOUS

## 2018-12-01 MED ORDER — VANCOMYCIN HCL 1000 MG IV SOLR
INTRAVENOUS | Status: DC | PRN
  Administered 2018-12-01: 1000 mg via INTRAVENOUS

## 2018-12-01 MED ORDER — VITAMIN B-12 1000 MCG PO TABS
2000.0000 ug | ORAL_TABLET | Freq: Every day | ORAL | Status: DC
  Administered 2018-12-01 – 2018-12-06 (×6): 2000 ug via ORAL
  Filled 2018-12-01 (×7): qty 2

## 2018-12-01 MED ORDER — GLYCOPYRROLATE PF 0.2 MG/ML IJ SOSY
PREFILLED_SYRINGE | INTRAMUSCULAR | Status: AC
  Filled 2018-12-01: qty 2

## 2018-12-01 MED ORDER — ROCURONIUM BROMIDE 10 MG/ML (PF) SYRINGE
PREFILLED_SYRINGE | INTRAVENOUS | Status: AC
  Filled 2018-12-01: qty 10

## 2018-12-01 MED ORDER — GABAPENTIN 400 MG PO CAPS
400.0000 mg | ORAL_CAPSULE | Freq: Two times a day (BID) | ORAL | Status: DC
  Administered 2018-12-01 – 2018-12-06 (×10): 400 mg via ORAL
  Filled 2018-12-01 (×10): qty 1

## 2018-12-01 MED ORDER — ALPRAZOLAM 0.5 MG PO TABS
1.0000 mg | ORAL_TABLET | Freq: Three times a day (TID) | ORAL | Status: DC
  Administered 2018-12-01 – 2018-12-06 (×15): 1 mg via ORAL
  Filled 2018-12-01 (×15): qty 2

## 2018-12-01 MED ORDER — CHLORHEXIDINE GLUCONATE 4 % EX LIQD: 60.0000 mL | Freq: Once | CUTANEOUS | Status: DC

## 2018-12-01 MED ORDER — DORZOLAMIDE HCL 2 % OP SOLN
1.0000 [drp] | Freq: Three times a day (TID) | OPHTHALMIC | Status: DC
  Administered 2018-12-01 – 2018-12-06 (×14): 1 [drp] via OPHTHALMIC
  Filled 2018-12-01: qty 10

## 2018-12-01 MED ORDER — ACETAMINOPHEN 10 MG/ML IV SOLN
INTRAVENOUS | Status: AC
  Filled 2018-12-01: qty 200

## 2018-12-01 MED ORDER — LATANOPROST 0.005 % OP SOLN
1.0000 [drp] | Freq: Every day | OPHTHALMIC | Status: DC
  Administered 2018-12-01 – 2018-12-06 (×5): 1 [drp] via OPHTHALMIC
  Filled 2018-12-01: qty 2.5

## 2018-12-01 MED ORDER — METOCLOPRAMIDE HCL 5 MG PO TABS: 5.0000 mg | ORAL_TABLET | Freq: Three times a day (TID) | ORAL | Status: DC | PRN

## 2018-12-01 MED ORDER — HYDROCHLOROTHIAZIDE 25 MG PO TABS
25.0000 mg | ORAL_TABLET | Freq: Every day | ORAL | Status: DC
  Administered 2018-12-01 – 2018-12-06 (×6): 25 mg via ORAL
  Filled 2018-12-01 (×6): qty 1

## 2018-12-01 MED ORDER — FENTANYL CITRATE (PF) 250 MCG/5ML IJ SOLN
INTRAMUSCULAR | Status: DC | PRN
  Administered 2018-12-01 (×4): 25 ug via INTRAVENOUS

## 2018-12-01 MED ORDER — ADULT MULTIVITAMIN W/MINERALS CH
1.0000 | ORAL_TABLET | Freq: Every day | ORAL | Status: DC
  Administered 2018-12-01 – 2018-12-06 (×6): 1 via ORAL
  Filled 2018-12-01 (×6): qty 1

## 2018-12-01 MED ORDER — PROPOFOL 10 MG/ML IV BOLUS
INTRAVENOUS | Status: DC | PRN
  Administered 2018-12-01: 140 mg via INTRAVENOUS

## 2018-12-01 MED ORDER — INSULIN ASPART 100 UNIT/ML ~~LOC~~ SOLN
0.0000 [IU] | Freq: Three times a day (TID) | SUBCUTANEOUS | Status: DC
  Administered 2018-12-02: 13:00:00 2 [IU] via SUBCUTANEOUS
  Administered 2018-12-03: 3 [IU] via SUBCUTANEOUS
  Administered 2018-12-03: 12:00:00 1 [IU] via SUBCUTANEOUS
  Administered 2018-12-04: 2 [IU] via SUBCUTANEOUS
  Administered 2018-12-04: 3 [IU] via SUBCUTANEOUS
  Administered 2018-12-05 – 2018-12-06 (×4): 2 [IU] via SUBCUTANEOUS

## 2018-12-01 MED ORDER — SODIUM CHLORIDE 0.9 % IV SOLN
2.0000 g | Freq: Three times a day (TID) | INTRAVENOUS | Status: DC
  Administered 2018-12-01 – 2018-12-05 (×11): 2 g via INTRAVENOUS
  Filled 2018-12-01 (×11): qty 2

## 2018-12-01 MED ORDER — DULOXETINE HCL 30 MG PO CPEP
30.0000 mg | ORAL_CAPSULE | Freq: Every day | ORAL | Status: DC
  Administered 2018-12-02 – 2018-12-06 (×5): 30 mg via ORAL
  Filled 2018-12-01 (×6): qty 1

## 2018-12-01 MED ORDER — LOSARTAN POTASSIUM 25 MG PO TABS
25.0000 mg | ORAL_TABLET | Freq: Every day | ORAL | Status: DC
  Administered 2018-12-01 – 2018-12-06 (×6): 25 mg via ORAL
  Filled 2018-12-01 (×6): qty 1

## 2018-12-01 SURGICAL SUPPLY — 62 items
APL PRP STRL LF DISP 70% ISPRP (MISCELLANEOUS) ×1
BANDAGE ESMARK 6X9 LF (GAUZE/BANDAGES/DRESSINGS) ×1 IMPLANT
BLADE SURG 10 STRL SS (BLADE) ×3 IMPLANT
BNDG CMPR 9X6 STRL LF SNTH (GAUZE/BANDAGES/DRESSINGS) ×1
BNDG COHESIVE 4X5 TAN STRL (GAUZE/BANDAGES/DRESSINGS) ×3 IMPLANT
BNDG COHESIVE 6X5 TAN STRL LF (GAUZE/BANDAGES/DRESSINGS) ×3 IMPLANT
BNDG ESMARK 6X9 LF (GAUZE/BANDAGES/DRESSINGS) ×3
CANISTER SUCT 3000ML PPV (MISCELLANEOUS) ×3 IMPLANT
CATH THORACIC 36FR (CATHETERS) ×2 IMPLANT
CATH TROCAR 20FR (CATHETERS) IMPLANT
CEMENT BONE REFOBACIN R1X40 US (Cement) ×2 IMPLANT
CHLORAPREP W/TINT 26 (MISCELLANEOUS) ×3 IMPLANT
COVER SURGICAL LIGHT HANDLE (MISCELLANEOUS) ×3 IMPLANT
COVER WAND RF STERILE (DRAPES) ×3 IMPLANT
CUFF TOURN SGL QUICK 34 (TOURNIQUET CUFF) ×3
CUFF TOURN SGL QUICK 42 (TOURNIQUET CUFF) IMPLANT
CUFF TRNQT CYL 34X4.125X (TOURNIQUET CUFF) ×1 IMPLANT
DRAPE C-ARM 42X72 X-RAY (DRAPES) ×3 IMPLANT
DRAPE U-SHAPE 47X51 STRL (DRAPES) ×3 IMPLANT
DRSG ADAPTIC 3X8 NADH LF (GAUZE/BANDAGES/DRESSINGS) IMPLANT
DRSG PAD ABDOMINAL 8X10 ST (GAUZE/BANDAGES/DRESSINGS) IMPLANT
ELECT REM PT RETURN 9FT ADLT (ELECTROSURGICAL) ×3
ELECTRODE REM PT RTRN 9FT ADLT (ELECTROSURGICAL) ×1 IMPLANT
GAUZE SPONGE 4X4 12PLY STRL (GAUZE/BANDAGES/DRESSINGS) IMPLANT
GLOVE BIO SURGEON STRL SZ8 (GLOVE) ×3 IMPLANT
GLOVE BIOGEL PI IND STRL 8 (GLOVE) ×2 IMPLANT
GLOVE BIOGEL PI INDICATOR 8 (GLOVE) ×4
GLOVE ECLIPSE 8.0 STRL XLNG CF (GLOVE) ×3 IMPLANT
GOWN STRL REUS W/ TWL LRG LVL3 (GOWN DISPOSABLE) ×1 IMPLANT
GOWN STRL REUS W/ TWL XL LVL3 (GOWN DISPOSABLE) ×2 IMPLANT
GOWN STRL REUS W/TWL LRG LVL3 (GOWN DISPOSABLE) ×3
GOWN STRL REUS W/TWL XL LVL3 (GOWN DISPOSABLE) ×6
GUIDEWIRE 2.6X80 BEAD TIP (WIRE) IMPLANT
GUIDWIRE 2.6X80 BEAD TIP (WIRE) ×3
IV NS IRRIG 3000ML ARTHROMATIC (IV SOLUTION) ×2 IMPLANT
KIT BASIN OR (CUSTOM PROCEDURE TRAY) ×3 IMPLANT
KIT TURNOVER KIT B (KITS) ×3 IMPLANT
NEEDLE 22X1 1/2 (OR ONLY) (NEEDLE) IMPLANT
NS IRRIG 1000ML POUR BTL (IV SOLUTION) ×3 IMPLANT
PACK ORTHO EXTREMITY (CUSTOM PROCEDURE TRAY) ×3 IMPLANT
PAD ARMBOARD 7.5X6 YLW CONV (MISCELLANEOUS) ×6 IMPLANT
PAD CAST 4YDX4 CTTN HI CHSV (CAST SUPPLIES) ×1 IMPLANT
PADDING CAST COTTON 4X4 STRL (CAST SUPPLIES) ×3
SET CYSTO W/LG BORE CLAMP LF (SET/KITS/TRAYS/PACK) ×2 IMPLANT
SOAP 2 % CHG 4 OZ (WOUND CARE) ×3 IMPLANT
SPONGE LAP 4X18 RFD (DISPOSABLE) ×3 IMPLANT
STAPLER VISISTAT 35W (STAPLE) IMPLANT
SUCTION FRAZIER HANDLE 10FR (MISCELLANEOUS) ×2
SUCTION TUBE FRAZIER 10FR DISP (MISCELLANEOUS) ×1 IMPLANT
SUT ETHILON 2 0 PSLX (SUTURE) ×4 IMPLANT
SUT PROLENE 3 0 PS 2 (SUTURE) ×1 IMPLANT
SUT VIC AB 2-0 CT1 27 (SUTURE) ×3
SUT VIC AB 2-0 CT1 TAPERPNT 27 (SUTURE) ×2 IMPLANT
SUT VIC AB 3-0 PS2 18 (SUTURE)
SUT VIC AB 3-0 PS2 18XBRD (SUTURE) ×1 IMPLANT
SYR CONTROL 10ML LL (SYRINGE) ×2 IMPLANT
TOWEL GREEN STERILE (TOWEL DISPOSABLE) ×3 IMPLANT
TOWEL GREEN STERILE FF (TOWEL DISPOSABLE) ×3 IMPLANT
TOWER CARTRIDGE SMART MIX (DISPOSABLE) ×2 IMPLANT
TUBE CONNECTING 12'X1/4 (SUCTIONS) ×1
TUBE CONNECTING 12X1/4 (SUCTIONS) ×2 IMPLANT
WATER STERILE IRR 1000ML POUR (IV SOLUTION) ×3 IMPLANT

## 2018-12-01 NOTE — Progress Notes (Signed)
Pharmacy Antibiotic Note  Jason Woods is a 73 y.o. male admitted on 12/01/2018 with.  Pharmacy has been consulted for vancomycin for osteomyelitis and cefepime dosing for bacteremia. Afebrile.  Vancomycin 1000 mg IV x1 given at 13:53 12/01/18. And cefazolin 2g IV x1 preop given.   Plan: Vancomycin 750 mg IV x1 now (for total load of 1750 mg today including the preop 1000 mg dose) then Vancomycin  1500 mg IV Q24hrs. Goal AUC 400-550. Expected AUC: 488.5 SCr used:0.99 Cefepime 2g IV q8hr Monitor clinical status, renal function and culture results daily.  Vancomycin levels at steady state if needed per protocol.   Height: 5\' 7"  (170.2 cm) Weight: 168 lb (76.2 kg) IBW/kg (Calculated) : 66.1  Temp (24hrs), Avg:97.8 F (36.6 C), Min:97.5 F (36.4 C), Max:98 F (36.7 C)  Recent Labs  Lab 12/01/18 1029  WBC 6.8  CREATININE 0.99    Estimated Creatinine Clearance: 62.1 mL/min (by C-G formula based on SCr of 0.99 mg/dL).    Allergies  Allergen Reactions  . Lipitor [Atorvastatin] Other (See Comments)    Aching, MS  . Zocor [Simvastatin-High Dose] Other (See Comments)    MS aches  . Celexa [Citalopram Hydrobromide] Other (See Comments)    Nausea,blurred vision  . Crestor [Rosuvastatin]     Pain, aching muscles  . Mobic [Meloxicam] Other (See Comments)    nausea  . Prozac [Fluoxetine Hcl] Other (See Comments)    fatigue    Antimicrobials this admission: Vancomycin 9/10>> Cefepime 9/10>> Cefazolin x1 preop 9/10  Dose adjustments this admission:   Microbiology results: 9/8 Covid: negative 9/10 tissue left ankle: pending   Thank you for allowing pharmacy to be a part of this patient's care.  Nicole Cella, RPh Clinical Pharmacist Please check AMION for all [facility] phone numbers After 10:00 PM, call [place] [phone] 12/01/2018 5:36 PM

## 2018-12-01 NOTE — Anesthesia Preprocedure Evaluation (Signed)
Anesthesia Evaluation  Patient identified by MRN, date of birth, ID band Patient awake    Reviewed: Allergy & Precautions, NPO status , Patient's Chart, lab work & pertinent test results  History of Anesthesia Complications Negative for: history of anesthetic complications  Airway Mallampati: II  TM Distance: >3 FB Neck ROM: Full    Dental  (+) Poor Dentition, Dental Advisory Given, Missing   Pulmonary pneumonia (Feb 2019), resolved, Current Smoker and Patient abstained from smoking.,    Pulmonary exam normal breath sounds clear to auscultation       Cardiovascular hypertension, Pt. on medications Normal cardiovascular exam Rhythm:Regular Rate:Normal     Neuro/Psych PSYCHIATRIC DISORDERS Anxiety Depression negative neurological ROS     GI/Hepatic Neg liver ROS, GERD  Medicated and Controlled,  Endo/Other  diabetes, Well Controlled, Type 2, Insulin Dependent, Oral Hypoglycemic Agents  Renal/GU negative Renal ROS     Musculoskeletal  (+) Arthritis , Osteoarthritis,  Left ankle osteomyelitis   Abdominal   Peds  Hematology negative hematology ROS (+)   Anesthesia Other Findings Day of surgery medications reviewed with the patient.  Reproductive/Obstetrics                             Anesthesia Physical  Anesthesia Plan  ASA: III  Anesthesia Plan: General   Post-op Pain Management: GA combined w/ Regional for post-op pain   Induction: Intravenous  PONV Risk Score and Plan: 1 and Ondansetron and Treatment may vary due to age or medical condition  Airway Management Planned: LMA  Additional Equipment:   Intra-op Plan:   Post-operative Plan: Extubation in OR  Informed Consent: I have reviewed the patients History and Physical, chart, labs and discussed the procedure including the risks, benefits and alternatives for the proposed anesthesia with the patient or authorized  representative who has indicated his/her understanding and acceptance.     Dental advisory given  Plan Discussed with: CRNA and Surgeon  Anesthesia Plan Comments:         Anesthesia Quick Evaluation

## 2018-12-01 NOTE — Anesthesia Procedure Notes (Signed)
Procedure Name: LMA Insertion Date/Time: 12/01/2018 12:53 PM Performed by: Cleda Daub, CRNA Pre-anesthesia Checklist: Patient identified, Emergency Drugs available, Suction available and Patient being monitored Patient Re-evaluated:Patient Re-evaluated prior to induction Oxygen Delivery Method: Circle system utilized Preoxygenation: Pre-oxygenation with 100% oxygen Induction Type: IV induction LMA: LMA inserted LMA Size: 4.0 Number of attempts: 1 Placement Confirmation: positive ETCO2 and breath sounds checked- equal and bilateral Tube secured with: Tape Dental Injury: Teeth and Oropharynx as per pre-operative assessment

## 2018-12-01 NOTE — H&P (Signed)
Jason Woods is an 73 y.o. male.   Chief Complaint: Left ankle pain   HPI: The patient is a 73 year old male with a past medical history significant for diabetes.  He has a long history of left ankle pain after attempted fusion with a TTC nail in October 2019.  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  He presents today for removal of deep implants and excisional debridement with implantation of an antibiotic spacer.  Past Medical History:  Diagnosis Date  . Anxiety    "about all my life" (01/07/2018)  . Arthritis    "shoulders, back, hips" (01/07/2018)  . CAP (community acquired pneumonia) 04/2017  . Chronic back pain    "upper and lower" (01/07/2018)  . 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 Depression   . GERD (gastroesophageal reflux disease)   . High cholesterol   . History of kidney stones   . Hypertension   . Legally blind   . Skin cancer    "frozen off my face, arms" (01/07/2018)  . Type II diabetes mellitus ([place])   . Vitamin B deficiency 03/09/2017    Past Surgical History:  Procedure Laterality Date  . ANKLE FUSION Left 01/12/2018   Procedure: LEFT ANKLE REMOVAL OF EXTERNAL FIXATOR, ANKLE FUSION WITH [place] NAIL;  Surgeon: [name], MD;  Location: [facility];  Service: Orthopedics;  Laterality: Left;  . ANKLE HARDWARE REMOVAL Left 01/07/2018   REMOVAL OF HARDWARE, TAKE DOWN OF NONUNION, INSERTION OF ANTIBIOTC BEADS/notes 01/07/2018  . BIOPSY  04/23/2017   Procedure: BIOPSY;  Surgeon: Rogene Houston, MD;  [facility];  Service: Endoscopy;;  gastric  . CATARACT EXTRACTION W/ INTRAOCULAR LENS  IMPLANT, BILATERAL Bilateral   . ESOPHAGEAL DILATION N/A 04/23/2017   Procedure: ESOPHAGEAL DILATION;  Surgeon: Rogene Houston, MD;  [facility];  Service: Endoscopy;  Laterality: N/A;  . ESOPHAGOGASTRODUODENOSCOPY N/A 04/23/2017   Procedure: ESOPHAGOGASTRODUODENOSCOPY (EGD);  Surgeon: Rogene Houston, MD;  [facility];  Service: Endoscopy;  Laterality: N/A;  9:55-moved to 1040 per Lelon Frohlich  . EXTERNAL FIXATION LEG Left 01/07/2018   EXTERNAL FIXATION LEG, TIBIA TO CALCANEUS/notes 01/07/2018  . EXTERNAL FIXATION LEG Left 01/07/2018   Procedure: EXTERNAL FIXATION LEG, TIBIA TO CALCANEUS;  Surgeon: [name], MD;  Location: [facility];  Service: Orthopedics;  Laterality: Left;  . EXTERNAL FIXATION REMOVAL Left 01/12/2018   Procedure: REMOVAL EXTERNAL FIXATION ANKLE;  Surgeon: [name], MD;  Location: [facility];  Service: Orthopedics;  Laterality: Left;  . FRACTURE SURGERY    . HARDWARE REMOVAL Left 01/07/2018   Procedure: LEFT ANKLE REMOVAL OF HARDWARE, TAKE DOWN OF NONUNION, INSERTION OF ANTIBIOTC BEADS;  Surgeon: [name], MD;  Location: [facility];  Service: Orthopedics;  Laterality: Left;  . I&D EXTREMITY Left 10/05/2017   Procedure: IRRIGATION AND DEBRIDEMENT QPEN ANKLE FRACTURE;  Surgeon: [name], MD;  Location: [facility];  Service: Orthopedics;  Laterality: Left;  . INNER EAR SURGERY Right 1962   "scraped the bone; couldn't hear out of it; was infected"  . MULTIPLE TOOTH EXTRACTIONS  2018  . ORIF ANKLE FRACTURE Left 10/05/2017   Procedure: OPEN REDUCTION INTERNAL FIXATION TRIMALLEOLAR (ORIF)ANKLE FRACTURE;  Surgeon: [name], MD;  Location: [facility] [place];  Service: Orthopedics;  Laterality: Left;  . WRIST FRACTURE SURGERY Left 2018    Family History  Problem Relation Age of Onset  . Diabetes  Mother   . Heart attack Father   . Diabetes Sister   . Diabetes Brother    Social History:  reports that he has been smoking cigarettes. He has a 62.00 pack-year smoking history. He has never used smokeless tobacco. He reports previous alcohol use. He reports previous drug use.  Allergies:  Allergies  Allergen Reactions  . Lipitor [Atorvastatin] Other (See Comments)    Aching, MS  . Zocor [Simvastatin-High Dose] Other (See Comments)    MS aches  . Celexa [Citalopram  Hydrobromide] Other (See Comments)    Nausea,blurred vision  . Crestor [Rosuvastatin]     Pain, aching muscles  . Mobic [Meloxicam] Other (See Comments)    nausea  . Prozac [Fluoxetine Hcl] Other (See Comments)    fatigue    Medications Prior to Admission  Medication Sig Dispense Refill  . ALPRAZolam (XANAX) 1 MG tablet Take 1 mg by mouth 3 (three) times daily.     [name] b complex vitamins tablet Take 1 tablet by mouth daily.    . bimatoprost (LUMIGAN) 0.01 % SOLN Place 1 drop into the right eye at bedtime.    . buprenorphine (SUBUTEX) 8 MG SUBL SL tablet Place 8 mg under the tongue 2 (two) times daily.   1  . Cholecalciferol (D 2000) 2000 units TABS Take 2,000 Units by mouth daily.    . cyanocobalamin 2000 MCG tablet Take 2,000 mcg by mouth daily.    . dorzolamide (TRUSOPT) 2 % ophthalmic solution Place 1 drop into both eyes 3 (three) times daily.   3  . DULoxetine (CYMBALTA) 30 MG capsule Take 30 mg by mouth daily.   90  . fluticasone (FLONASE) 50 MCG/ACT nasal spray Place 2 sprays into both nostrils daily.     [name] gabapentin (NEURONTIN) 400 MG capsule Take 400 mg by mouth 2 (two) times daily.     . hydrochlorothiazide (HYDRODIURIL) 25 MG tablet Take 25 mg by mouth daily.  4  . hydrOXYzine (ATARAX/VISTARIL) 25 MG tablet Take 25 mg by mouth at bedtime.    . insulin glargine (LANTUS) 100 UNIT/ML injection Inject 0.1 mLs (10 Units total) into the skin at bedtime. (Patient taking differently: Inject 10-18 Units into the skin 2 (two) times daily. Per sliding. 10-14 mornings 16-18 units evenings) 10 mL 1  . losartan (COZAAR) 25 MG tablet Take 1 tablet (25 mg total) by mouth daily. (Patient taking differently: Take 50 mg by mouth daily. ) 30 tablet 1  . metFORMIN (GLUCOPHAGE) 850 MG tablet Take 850 mg by mouth 2 (two) times daily with a meal.    . metoprolol succinate (TOPROL-XL) 25 MG 24 hr tablet Take 25 mg by mouth daily.    . Multiple Vitamin (MULTIVITAMIN WITH MINERALS) TABS tablet Take 1  tablet by mouth daily.    . nicotine (NICODERM CQ - DOSED IN MG/24 HOURS) 14 mg/24hr patch Place 1 patch (14 mg total) onto the skin daily. 30 patch 0  . oxyCODONE-acetaminophen (PERCOCET) 5-325 MG tablet Take 1 tablet by mouth every 4 (four) hours as needed for severe pain. 30 tablet 0  . pantoprazole (PROTONIX) 40 MG tablet Take 40 mg by mouth daily.    . polyethylene glycol (MIRALAX / GLYCOLAX) packet Take 17 g by mouth daily as needed for mild constipation.   12  . triamcinolone cream (KENALOG) 0.1 % Apply 1 application topically daily as needed (affected area(s) on skin).   5  . zolpidem (AMBIEN CR) 6.25 MG CR tablet Take 1  tablet (6.25 mg total) by mouth at bedtime as needed for sleep. 10 tablet 0    Results for orders placed or performed during the hospital encounter of 12/02/2018 (from the past 48 hour(s))  Glucose, capillary     Status: Abnormal   Collection Time: Dec 02, 2018 10:07 AM  Result Value Ref Range   Glucose-Capillary 103 (H) 70 - 99 mg/dL  CBC     Status: Abnormal   Collection Time: Dec 02, 2018 10:29 AM  Result Value Ref Range   WBC 6.8 4.0 - 10.5 K/uL   RBC 4.01 (L) 4.22 - 5.81 MIL/uL   Hemoglobin 9.1 (L) 13.0 - 17.0 g/dL   HCT 30.4 (L) 39.0 - 52.0 %   MCV 75.8 (L) 80.0 - 100.0 fL   MCH 22.7 (L) 26.0 - 34.0 pg   MCHC 29.9 (L) 30.0 - 36.0 g/dL   RDW 16.5 (H) 11.5 - 15.5 %   Platelets 334 150 - 400 K/uL   nRBC 0.0 0.0 - 0.2 %    Comment: Performed at [facility], [address], [place], [place] [id number]  Basic metabolic panel     Status: Abnormal   Collection Time: Dec 02, 2018 10:29 AM  Result Value Ref Range   Sodium 126 (L) 135 - 145 mmol/L   Potassium 3.8 3.5 - 5.1 mmol/L   Chloride 87 (L) 98 - 111 mmol/L   CO2 26 22 - 32 mmol/L   Glucose, Bld 97 70 - 99 mg/dL   BUN 9 8 - 23 mg/dL   Creatinine, Ser 0.99 0.61 - 1.24 mg/dL   Calcium 8.8 (L) 8.9 - 10.3 mg/dL   GFR calc non Af Amer >60 >60 mL/min   GFR calc Af Amer >60 >60 mL/min   Anion gap 13 5 - 15     Comment: Performed at [facility], [place] [address], [place],  [address]   No results found.  ROS no recent fever, chills, nausea, vomiting or changes in his appetite  Blood pressure (!) 152/74, pulse 73, temperature 98 F (36.7 C), temperature source Oral, resp. rate 18, height 5\' 7"  (1.702 m), weight 76.2 kg, SpO2 100 %. Physical Exam  Well-nourished well-developed male no apparent distress.  Alert and oriented x4.  Mood and affect are normal.  Extraocular motions are intact.  Respirations are unlabored.  He is hard of hearing.  Left ankle has healed surgical incisions.  There is swelling around the ankle but no erythema or warmth.  He is tender to palpation about the ankle.  Pulses are palpable in the foot.  Sensibility to light touch is intact dorsally and plantarly at the forefoot.  5 out of 5 strength in plantarflexion and dorsiflexion of the toes.  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  The risks and benefits of the alternative treatment options have been discussed in detail.  The patient wishes to proceed with surgery and specifically understands risks of bleeding, infection, nerve damage, blood clots, need for additional surgery, amputation and death.   Wylene Simmer, MD December 02, 2018, 12:25 PM

## 2018-12-01 NOTE — Op Note (Signed)
12/01/2018  2:54 PM  PATIENT:  Jason Woods  73 y.o. male  PRE-OPERATIVE DIAGNOSIS: 1.  Left ankle and subtalar joint nonunion      2.  Retained hardware left tibia, talus and calcaneus  POST-OPERATIVE DIAGNOSIS:  Same  Procedure(s): 1.  Removal of deep implants left tibia 2.  Removal of deep implants left talus 3.  Removal of deep implants left calcaneus 4.  Irrigation and excisional debridement of right calcaneus, talus and tibia including skin, subcutaneous tissue, muscle and bone  SURGEON:  Wylene Simmer, MD  ASSISTANT: none  ANESTHESIA:   General  EBL:  minimal   TOURNIQUET:   Total Tourniquet Time Documented: Thigh (Left) - 69 minutes Total: Thigh (Left) - 69 minutes  COMPLICATIONS:  None apparent  DISPOSITION:  Extubated, awake and stable to recovery.  INDICATION FOR PROCEDURE: Patient is a 73 year old male with past medical history significant for diabetes.  Had an ankle fracture last year and was treated with tibio-talocalcaneal arthrodesis by Dr. Lorin Mercy.  His postoperative course was complicated by infection.  He has developed what appears to be an infected nonunion of his ankle and subtalar joint.  He presents now for operative treatment of this condition.  He understands the risks and benefits of the alternative treatment options and elect surgical treatment.  He specifically understands risks of bleeding, infection, nerve damage, blood clots, continued pain, amputation and death.  PROCEDURE IN DETAIL: After preoperative consent was obtained and the correct operative site was identified, the patient was brought to the operating room placed upon the operating table.  Preoperative antibiotics were held.  Surgical timeout was taken.  General anesthesia was administered.  The left lower extremity was prepped and draped in standard sterile fashion with a tourniquet around the thigh.  The extremity was elevated and the tourniquet was inflated to 250 mmHg.  The screw heads  were identified at the medial tibia.  An incision was made and dissection was carried around the proximal screw head.  The screw was removed without difficulty.  The distal screw was removed and the same interaction.  A lateral fluoroscopic view was obtained and was used to identify the screw of the talus.  Is actually in the subtalar joint.  An incision was made and dissection was carried down through the subcutaneous tissues.  The screw head was identified.  The screw was pushed over to the lateral side subsequently withdrawn.  A small incision was made at the lateral hindfoot and the screw tip was identified.  The screw was then pushed back out through the medial incision and removed.  A lateral incision was then extended distally.  The lateral screw in the calcaneus was completely overgrown with bone.  And rondure were used to remove the overgrown bone.  The screw was then removed without difficulty.  A plantar incision was identified.  It was opened again sharply and dissection was carried down through the subcutaneous tissues.  The nail was identified.  The extraction bolt was inserted and tightened securely.  The nail was then removed.  Deep tissue specimens were then obtained from the canal and the nail.  These were passed off the field as a specimen to microbiology for aerobic and anaerobic culture.  IV vancomycin and Ancef were then administered.  A curette was then used to debride the medullary canal extensively.  This was done in the tibia and down through the talus and in the calcaneus.  Each of the drill holes in the tibia, talus  and calcaneus was also debrided with a curette.  The wounds in the medullary canal were irrigated with 3 L of normal saline.  On the back table a chest tube was used to fabricate an antibiotic nail with gentamicin and vancomycin.  A guidepin was placed within the cement to allow extraction later.  The nail was placed in the medullary canal.  AP and lateral radiographs of the  left ankle showed appropriate position of the nail.  Incisions were all closed with nylon.  Sterile dressings were applied followed by a well-padded short leg splint.  Tourniquet released was released after application of the dressings.  The patient was awakened from anesthesia and transported to the recovery room in stable condition.  FOLLOW UP PLAN: Patient will be admitted for IV antibiotics and infectious disease consultation while the cultures are pending.  Physical therapy and Occupational Therapy consultation will be obtained.  Lovenox for DVT prophylaxis.   RADIOGRAPHS: AP and lateral radiographs of the left ankle were obtained intraoperatively.  These show interval removal of hardware from the tibia, talus and calcaneus.  A cement nail with a guidewire is present within the medullary canal.

## 2018-12-01 NOTE — Consult Note (Signed)
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 male with a history of non-union of left calcaneal and talus fracture from previous fall in July 2019. He is now on his third surgery for this ankle with all hardware removed today with Dr. Doran Durand. Intraoperative tissue culture so far has no organisms on gram stain preliminarily. The only antibiotic history I can get from him includes Cipro PO in the past (last used October per our records but will have pharmacy team assist to ensure this is correct). Will plan on starting vancomycin + cefepime for now and follow cultures. 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 Unfortunately in the past he has been culture negative (aside from a GVR that was on one gram stain that yielded no growth); his amoxicillin last night may impact ability to recover an organism.   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 Will see what ABIs outpatient looked like - on assessment he seems to have well perfused foot.     Plan: 1. Blood cultures now 2. CRP/ESR in AM for baseline 3. Hold on PICC for now pending #1 4. Counseled regarding smoking cessation 5. Start vancomycin + cefepime  6. Follow micro data   Active Problems:   Trimalleolar fracture of ankle, closed, left, with nonunion, subsequent encounter   . chlorhexidine  60 mL Topical Once  . fentaNYL        HPI: Jason Woods is a 73 y.o. male here for planned admission to remove hardware from infected nonunion of left ankle. Consult obtained from Dr. Doran Durand.   He has a history of diabetes, arthritis, GERD, elevated cholesterol, hypertension, legal blindness, skin cancer. He underwent an ankle fusion in October 2019 for long standing ankle pain. He was admitted for planned hardware explant based on xray report findings c/w nonunion d/t infection. Jason Woods and his wife tell me that he fell in July 2019 and presented to the hospital for repair and debridement of open ankle fracture. Revision was performed with hardware removal due to displacement and non-union in October 2019 --> during this second surgery he required external fixator application for a period of time with internal fixation last applied 01/12/2018. He developed an ankle effusion following this procedure. Antibiotic beads were placed during most recent two surgeries. Rare gram variable rod was identified on gram stain in October from one of the tissue cultures sent from OR but no growth then.   I see where he saw Dr. Lorin Mercy in the office in May - screws appeared loose at that time and unstable fusion. He was supposed to work on getting ABIs and then follow up with Dr. Sharol Given for revision vs hardware explant --> I don't see where he made this appointment.   He has continued to have pain at  this site; he sough care with Dr. Doran Durand recently for this and found to have ongoing non-union of bone and suspected infection. Jason Woods describes no trouble with drainage from incision site or erythema but has difficulty bearing weight. He was not given any antibiotics for any reason prior to this surgery but on his own accord took 4 amoxicillin pills last night. He smokes a pack of cigarettes a week. No alcohol use. He has had no long courses of antibiotics to his knowledge.   Operative Note Reviewed - removal of hardware of left tibia, left talus,  left calcaneus with subsequent I&D of the same; antibiotic cement nail was created and placed within the medullary canal. Vancomycin and cefazolin were given intra op.    Sed Rate (mm/hr)  Date Value  01/07/2018 41 (H)   CRP (mg/dL)  Date Value  01/07/2018 6.4 (H)   Temp Readings from Last 1 Encounters:  12/01/18 (!) 97.5 F (36.4 C)   Lab Results  Component Value Date   WBC 6.8 12/01/2018    Review of Systems: Review of Systems  Constitutional: Negative for chills, fever, malaise/fatigue and weight loss.  HENT: Negative for sore throat.        No dental problems  Respiratory: Negative for cough and sputum production.   Cardiovascular: Negative for chest pain and leg swelling.  Gastrointestinal: Negative for abdominal pain, diarrhea and vomiting.  Genitourinary: Negative for dysuria and flank pain.  Musculoskeletal: Negative for joint pain, myalgias and neck pain.  Skin: Negative for rash.  Neurological: Negative for dizziness, tingling and headaches.  Psychiatric/Behavioral: Negative for depression and substance abuse. The patient is not nervous/anxious and does not have insomnia.     Past Medical History:  Diagnosis Date  . Anxiety    "about all my life" (01/07/2018)  . Arthritis    "shoulders, back, hips" (01/07/2018)  . CAP (community acquired pneumonia) 04/2017  . Chronic back pain    "upper and lower" (01/07/2018)  . 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 Depression   . GERD (gastroesophageal reflux disease)   . High cholesterol   . History of kidney stones   . Hypertension   . Legally blind   . Skin cancer    "frozen off my face, arms" (01/07/2018)  . Type II diabetes mellitus ([place])   . Vitamin B deficiency 03/09/2017    Social History   Tobacco Use  . Smoking status: Current Every Day Smoker    Packs/day: 1.00    Years: 62.00    Pack years: 62.00    Types: Cigarettes  . Smokeless tobacco: Never Used  Substance Use Topics  . Alcohol  use: Not Currently    Frequency: Never    Comment:  "nothing since the 1990s"  . Drug use: Not Currently    Comment:   "nothing since 20s"    Family History  Problem Relation Age of Onset  . Diabetes Mother   . Heart attack Father   . Diabetes Sister   . Diabetes Brother    Allergies  Allergen Reactions  . Lipitor [Atorvastatin] Other (See Comments)    Aching, MS  . Zocor [Simvastatin-High Dose] Other (See Comments)    MS aches  . Celexa [Citalopram Hydrobromide] Other (See Comments)    Nausea,blurred vision  . Crestor [Rosuvastatin]     Pain, aching muscles  . Mobic [Meloxicam] Other (See Comments)    nausea  . Prozac [Fluoxetine Hcl] Other (See Comments)  fatigue    OBJECTIVE: Blood pressure (!) 143/101, pulse 65, temperature (!) 97.5 F (36.4 C), resp. rate 17, height 5' 7" (1.702 m), weight 76.2 kg, SpO2 100 %.  Physical Exam  Lab Results Lab Results  Component Value Date   WBC 6.8 12/01/2018   HGB 9.1 (L) 12/01/2018   HCT 30.4 (L) 12/01/2018   MCV 75.8 (L) 12/01/2018   PLT 334 12/01/2018    Lab Results  Component Value Date   CREATININE 0.99 12/01/2018   BUN 9 12/01/2018   NA 126 (L) 12/01/2018   K 3.8 12/01/2018   CL 87 (L) 12/01/2018   CO2 26 12/01/2018    Lab Results  Component Value Date   ALT 6 01/11/2018   AST 17 01/11/2018   ALKPHOS 91 01/11/2018   BILITOT 0.5 01/11/2018     Microbiology: Recent Results (from the past 240 hour(s))  SARS CORONAVIRUS 2 (TAT 6-24 HRS) Nasopharyngeal Nasopharyngeal Swab     Status: None   Collection Time: 11/29/18  8:39 AM   Specimen: Nasopharyngeal Swab  Result Value Ref Range Status   SARS Coronavirus 2 NEGATIVE NEGATIVE Final    Comment: (NOTE) SARS-CoV-2 target nucleic acids are NOT DETECTED. The SARS-CoV-2 RNA is generally detectable in upper and lower respiratory specimens during the acute phase of infection. Negative results do not preclude SARS-CoV-2 infection, do not rule out co-infections  with other pathogens, and should not be used as the sole basis for treatment or other patient management decisions. Negative results must be combined with clinical observations, patient history, and epidemiological information. The expected result is Negative. Fact Sheet for Patients: SugarRoll.be Fact Sheet for Healthcare Providers: https://www.woods-mathews.com/ This test is not yet approved or cleared by the [place] FDA and  has been authorized for detection and/or diagnosis of SARS-CoV-2 by FDA under an Emergency Use Authorization (EUA). This EUA will remain  in effect (meaning this test can be used) for the duration of the COVID-19 declaration under Section 56 4(b)(1) of the Act, 21 U.S.C. section 360bbb-3(b)(1), unless the authorization is terminated or revoked sooner. Performed at [facility], [place] [address], [place], [place] [address]   Aerobic/Anaerobic Culture (surgical/deep wound)     Status: None (Preliminary result)   Collection Time: 12/01/18  1:50 PM   Specimen: Soft Tissue, Other  Result Value Ref Range Status   Specimen Description TISSUE LEFT ANKLE  Final   Special Requests NONE  Final   Gram Stain   Final    FEW WBC PRESENT,BOTH PMN AND MONONUCLEAR NO ORGANISMS SEEN Performed at [facility], [address] [place], [place] [address]    Culture PENDING  Incomplete   Report Status PENDING  Incomplete     Janene Madeira, MSN, NP-C [facility] [email or web address] Pager: [phone] Office: [place]: [phone]   12/01/2018 4:13 PM

## 2018-12-01 NOTE — Transfer of Care (Signed)
Immediate Anesthesia Transfer of Care Note  Patient: Skanda O Gautier  Procedure(s) Performed: Left calcaneus, talus, and tibia removal of deep implants; irrigation and excisional debridement (Left )  Patient Location: PACU  Anesthesia Type:General  Level of Consciousness: awake, alert , oriented and patient cooperative  Airway & Oxygen Therapy: Patient Spontanous Breathing and Patient connected to face mask oxygen  Post-op Assessment: Report given to RN and Post -op Vital signs reviewed and stable  Post vital signs: Reviewed and stable  Last Vitals:  Vitals Value Taken Time  BP 114/60 12/01/18 1438  Temp    Pulse 64 12/01/18 1440  Resp 12 12/01/18 1440  SpO2 100 % 12/01/18 1440  Vitals shown include unvalidated device data.  Last Pain:  Vitals:   12/01/18 1023  TempSrc:   PainSc: 0-No pain      Patients Stated Pain Goal: 0 ([id number])  Complications: No apparent anesthesia complications

## 2018-12-01 NOTE — Anesthesia Postprocedure Evaluation (Signed)
Anesthesia Post Note  Patient: Jason Woods  Procedure(s) Performed: Left calcaneus, talus, and tibia removal of deep implants; irrigation and excisional debridement (Left )     Patient location during evaluation: PACU Anesthesia Type: General Level of consciousness: awake and alert Pain management: pain level controlled Vital Signs Assessment: post-procedure vital signs reviewed and stable Respiratory status: spontaneous breathing, nonlabored ventilation and respiratory function stable Cardiovascular status: blood pressure returned to baseline and stable Postop Assessment: no apparent nausea or vomiting Anesthetic complications: no    Last Vitals:  Vitals:   12/01/18 1455 12/01/18 1510  BP: 126/69 134/79  Pulse: 64 70  Resp: 12 16  Temp:    SpO2: 100% 99%    Last Pain:  Vitals:   12/01/18 1520  TempSrc:   PainSc: 8                  Shubham Thackston A.

## 2018-12-02 ENCOUNTER — Encounter (HOSPITAL_COMMUNITY): Payer: Self-pay | Admitting: Orthopedic Surgery

## 2018-12-02 LAB — HEMOGLOBIN A1C
Hgb A1c MFr Bld: 6.6 % — ABNORMAL HIGH (ref 4.8–5.6)
Mean Plasma Glucose: 143 mg/dL

## 2018-12-02 LAB — C-REACTIVE PROTEIN: CRP: 1.8 mg/dL — ABNORMAL HIGH (ref <1.0)

## 2018-12-02 LAB — GLUCOSE, CAPILLARY
Glucose-Capillary: 114 mg/dL — ABNORMAL HIGH (ref 70–99)
Glucose-Capillary: 125 mg/dL — ABNORMAL HIGH (ref 70–99)
Glucose-Capillary: 120 mg/dL — ABNORMAL HIGH (ref 70–99)
Glucose-Capillary: 142 mg/dL — ABNORMAL HIGH (ref 70–99)

## 2018-12-02 LAB — SEDIMENTATION RATE: Sed Rate: 11 mm/hr (ref 0–16)

## 2018-12-02 MED ORDER — INFLUENZA VAC A&B SA ADJ QUAD 0.5 ML IM PRSY: 0.5000 mL | PREFILLED_SYRINGE | INTRAMUSCULAR | Status: DC | PRN

## 2018-12-02 NOTE — Evaluation (Signed)
Occupational Therapy Evaluation Patient Details Name: Jason Woods MRN: [id number] DOB: 04/17/1945 Today's Date: 12/02/2018    History of Present Illness Pt is a 73 y.o. male with L ankle pain after attempted fusion 12/2017, imaging shows infected nonunion, pt now admitted 12/01/18 s/p removal of deep implants of L calcaneus, talus, tibia. PMH includes DM, arthritis, chronic back pain, HTN, legally blind.   Clinical Impression   Pt is independent at baseline. He currently requires up to min assist for ADL and min guard assist with RW for OOB. Pt is compliant with NWB on L LE. Pt requesting another visit prior to discharge. Will follow.    Follow Up Recommendations  No OT follow up    Equipment Recommendations  None recommended by OT    Recommendations for Other Services       Precautions / Restrictions Precautions Precautions: Fall Restrictions Weight Bearing Restrictions: Yes LLE Weight Bearing: Non weight bearing      Mobility Bed Mobility Overal bed mobility: Modified Independent             General bed mobility comments: HOB up  Transfers Overall transfer level: Needs assistance Equipment used: Rolling walker (2 wheeled) Transfers: Sit to/from Stand Sit to Stand: Supervision         General transfer comment: cues for hand placement, supervision for safety.    Balance Overall balance assessment: Needs assistance   Sitting balance-Leahy Scale: Good       Standing balance-Leahy Scale: Poor                             ADL either performed or assessed with clinical judgement   ADL Overall ADL's : Needs assistance/impaired Eating/Feeding: Independent;Sitting   Grooming: Wash/dry hands;Sitting;Set up   Upper Body Bathing: Set up;Sitting   Lower Body Bathing: Minimal assistance;Sit to/from stand   Upper Body Dressing : Set up;Sitting   Lower Body Dressing: Minimal assistance;Sit to/from stand   Toilet Transfer: Min  guard;Ambulation;RW;BSC   Toileting- Water quality scientist and Hygiene: Supervision/safety;Sit to/from stand       Functional mobility during ADLs: Min guard;Rolling walker       Vision Baseline Vision/History: Legally blind Patient Visual Report: No change from baseline       Perception     Praxis      Pertinent Vitals/Pain Pain Assessment: Faces Faces Pain Scale: Hurts little more Pain Location: L LE Pain Descriptors / Indicators: Sore Pain Intervention(s): Monitored during session;Repositioned     Hand Dominance Right   Extremity/Trunk Assessment Upper Extremity Assessment Upper Extremity Assessment: Overall WFL for tasks assessed   Lower Extremity Assessment Lower Extremity Assessment: Defer to PT evaluation   Cervical / Trunk Assessment Cervical / Trunk Assessment: Normal   Communication Communication Communication: No difficulties   Cognition Arousal/Alertness: Awake/alert Behavior During Therapy: WFL for tasks assessed/performed Overall Cognitive Status: Within Functional Limits for tasks assessed                                     General Comments       Exercises     Shoulder Instructions      Home Living Family/patient expects to be discharged to:: Private residence Living Arrangements: Other relatives(brother) Available Help at Discharge: Family;Available PRN/intermittently Type of Home: House Home Access: Ramped entrance     Home Layout: One level  Bathroom Shower/Tub: Teacher, early years/pre: Standard     Home Equipment: Environmental consultant - 2 wheels;Wheelchair - [employer];Shower seat          Prior Functioning/Environment Level of Independence: Independent        Comments: does not drive        OT Problem List: Impaired balance (sitting and/or standing);Decreased knowledge of use of DME or AE;Pain;Impaired vision/perception      OT Treatment/Interventions: Self-care/ADL training;DME  and/or AE instruction;Patient/family education;Balance training;Therapeutic activities    OT Goals(Current goals can be found in the care plan section) Acute Rehab OT Goals Patient Stated Goal: return home with assist of his brother and daughter OT Goal Formulation: With patient Time For Goal Achievement: 12/16/18 Potential to Achieve Goals: Good  OT Frequency: Min 2X/week   Barriers to D/C:            Co-evaluation              AM-PAC OT "6 Clicks" Daily Activity     Outcome Measure Help from another person eating meals?: None Help from another person taking care of personal grooming?: A Little Help from another person toileting, which includes using toliet, bedpan, or urinal?: A Little Help from another person bathing (including washing, rinsing, drying)?: A Little Help from another person to put on and taking off regular upper body clothing?: None Help from another person to put on and taking off regular lower body clothing?: A Little 6 Click Score: 20   End of Session Equipment Utilized During Treatment: Gait belt;Rolling walker Nurse Communication: Mobility status  Activity Tolerance: Patient tolerated treatment well Patient left: in chair;with call bell/phone within reach  OT Visit Diagnosis: Unsteadiness on feet (R26.81);Other abnormalities of gait and mobility (R26.89);Pain;Low vision, both eyes (H54.2)                Time: [id number] OT Time Calculation (min): 18 min Charges:  OT General Charges $OT Visit: 1 Visit OT Evaluation $OT Eval Moderate Complexity: 1 Mod  Jason Woods, OTR/L Acute Rehabilitation Services Pager: [phone] Office: [phone]  Malka So 12/02/2018, 12:21 PM

## 2018-12-02 NOTE — Consult Note (Signed)
   [facility] CM Inpatient Consult   12/02/2018  SAYON PRIDE 1945/04/07 [id number]   Referral received for screening for patient's eligiblity for [facility] Care Management programs.  Chart reviewed and patient's MD progress notes reveals:  Left calcaneus, talus, and tibia removal of deep implants; irrigation and.thn excisional debridement (Left) Patient reports pain as moderate.   Primary Care Provider: Glenda Chroman, MD is currently listed.   Membership roster was used to verify status and currently had no primary care provide as non-attributed.  Will follow for clarification.  For questions, please contact:  Natividad Brood, RN BSN [facility] Liaison  [phone] business mobile phone Toll free office [phone]  Fax number: [phone] [email or web address]

## 2018-12-02 NOTE — TOC Initial Note (Signed)
Transition of Care [facility]) - Initial/Assessment Note    Patient Details  Name: Jason Woods MRN: [id number] Date of Birth: [birth year]  Transition of Care [facility]) CM/SW Contact:    Midge Minium RN, BSN, NCM-BC, ACM-RN [phone] Phone Number: 12/02/2018, 3:44 PM  Clinical Narrative:                 CM met with patient for transitional needs. Patient states living at home alone and being independent with his ADLs PTA. s/p removal of deep implants of L calcaneus, talus, tibia. PMH includes DM, arthritis, chronic back pain, HTN, legally blind. Patient will need 6 weeks of IV ABT with patients sister, Roosevelt Locks, agreeable to administer post-discharge. Carolynn Sayers RN, [employer] liaison, met with the patient/sister to discuss home infusion process. CMS HH Compare list was discussed and provided to the patient with [facility] selected for [facility] and [facility] services. Patient requested additional help post-discharge, but declined ST SNF. HH referral given to Adela Lank RN, [facility] liaison; AVS updated. Patient indicated that his sister will provide transportation home. CM team will continue to follow.   Expected Discharge Plan: [place] Barriers to Discharge: Continued Medical Work up   Patient Goals and CMS Choice Patient states their goals for this hospitalization and ongoing recovery are:: "to get back home" CMS Medicare.gov Compare Post Acute Care list provided to:: Patient Choice offered to / list presented to : Patient  Expected Discharge Plan and Services Expected Discharge Plan: [place] In-house Referral: NA Discharge Planning Services: CM Consult Post Acute Care Choice: Durable Medical Equipment, Home Health Living arrangements for the past 2 months: Single Family Home                 DME Arranged: IV pump/equipment(IV ABT) DME Agency: Other - Comment([employer]) Date DME Agency Contacted: 12/02/18 Time DME Agency Contacted:  1093 Representative spoke with at DME Agency: Carolynn Sayers (liaison) [place] Arranged: RN, Disease Management, PT, OT, Nurse's Aide, Refused SNF, Social Work [employer] Agency: [place] Date [place]: 12/02/18 Time [place]: 1455 Representative spoke with at [place]: Adela Lank (liaison)  Prior Living Arrangements/Services Living arrangements for the past 2 months: [place] with:: Self Patient language and need for interpreter reviewed:: Yes Do you feel safe going back to the place where you live?: Yes      Need for Family Participation in Patient Care: Yes (Comment) Care giver support system in place?: Yes (comment) Current home services: DME Criminal Activity/Legal Involvement Pertinent to Current Situation/Hospitalization: No - Comment as needed  Activities of Daily Living Home Assistive Devices/Equipment: CBG Meter, Cane (specify quad or straight) ADL Screening (condition at time of admission) Patient's cognitive ability adequate to safely complete daily activities?: Yes Is the patient deaf or have difficulty hearing?: No Does the patient have difficulty seeing, even when wearing glasses/contacts?: No Does the patient have difficulty concentrating, remembering, or making decisions?: No Patient able to express need for assistance with ADLs?: Yes Does the patient have difficulty dressing or bathing?: No Independently performs ADLs?: Yes (appropriate for developmental age) Does the patient have difficulty walking or climbing stairs?: No Weakness of Legs: None Weakness of Arms/Hands: None  Permission Sought/Granted Permission sought to share information with : Case Manager, Customer service manager, Family Supports Permission granted to share information with : Yes, Verbal Permission Granted  Share Information with NAME: Roosevelt Locks  Permission granted to share info w AGENCY:  [facility]  Permission granted to share info w  Relationship: sister  Permission granted to share info w Contact Information: [phone]  Emotional Assessment Appearance:: Appears stated age Attitude/Demeanor/Rapport: Gracious Affect (typically observed): Accepting, Calm, Pleasant Orientation: : Oriented to Self, Oriented to Place, Oriented to  Time, Oriented to Situation Alcohol / Substance Use: Not Applicable Psych Involvement: No (comment)  Admission diagnosis:  left ankle osteomyelitis Patient Active Problem List   Diagnosis Date Noted  . Trimalleolar fracture of ankle, closed, left, with nonunion, subsequent encounter 12/01/2018  . S/P ankle fusion 01/27/2018  . Fracture of left ankle 01/07/2018  . 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 Open left ankle fracture 10/05/2017  . Trimalleolar fracture, left, open type I or II, with nonunion, subsequent encounter 10/05/2017  . PNA (pneumonia) 04/29/2017  . Esophageal dysphagia 03/31/2017  . Abnormal esophagram 03/31/2017  . Diabetes ([place]) 03/09/2017  . Vitamin B deficiency 03/09/2017  . High cholesterol   . Hypertension   . GERD (gastroesophageal reflux disease)    PCP:  Glenda Chroman, MD Pharmacy:   [facility], [place] [address] [place] [address] Phone: [phone] Fax: [phone]     Social Determinants of Health (SDOH) Interventions    Readmission Risk Interventions No flowsheet data found.

## 2018-12-02 NOTE — Care Management (Signed)
CM consult acknowledged to assist with any HH/DME needs. Awaiting PT/OT eval for DCP recommendations and will continue to follow.  Fong Mccarry RN, BSN, NCM-BC, ACM-RN [phone] 

## 2018-12-02 NOTE — Plan of Care (Signed)

## 2018-12-02 NOTE — Progress Notes (Signed)
[facility] for Infectious Disease  Date of Admission:  12/01/2018      Total days of antibiotics 1  Day 1 vancomycin + cefepime          ASSESSMENT: Mr. 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 No growth from intra op cultures yet. Would continue vancomycin + cefepime pending final culture data. Would place PICC tomorrow if blood cultures remain negative.   Had discussion regarding recommended therapy with PICC line and IV antibiotics for treatment for planned course 6 weeks with consideration for oral therapy thereafter.   His sister was there today and we dicussed plan of care.    PLAN: 1. Continue vancomycin and cefepime 2. Follow micro data 3. PICC line tomorrow if blood cultures remain negative.    Active Problems:   Trimalleolar fracture of ankle, closed, left, with nonunion, subsequent encounter   . ALPRAZolam  1 mg Oral TID  . B-complex with vitamin C  1 tablet Oral Daily  . buprenorphine-naloxone  1 tablet Sublingual Daily  . cholecalciferol  2,000 Units Oral Daily  . docusate sodium  100 mg Oral BID  . dorzolamide  1 drop Both Eyes TID  . DULoxetine  30 mg Oral Daily  . enoxaparin (LOVENOX) injection  40 mg Subcutaneous Q24H  . fluticasone  2 spray Each Nare Daily  . gabapentin  400 mg Oral BID  . hydrochlorothiazide  25 mg Oral Daily  . hydrOXYzine  25 mg Oral QHS  . insulin aspart  0-15 Units Subcutaneous TID WC  . latanoprost  1 drop Right Eye QHS  . losartan  25 mg Oral Daily  . metFORMIN  850 mg Oral BID WC  . metoprolol succinate  25 mg Oral Daily  . multivitamin with minerals  1 tablet Oral Daily  . nicotine  14 mg Transdermal Q24H  . pantoprazole  40 mg Oral Daily  . senna  1 tablet Oral BID  . cyanocobalamin  2,000 mcg Oral Daily    SUBJECTIVE: Doing well today. Pain has improved. Got up to the chair with assistance today.   Temp Readings from  Last 1 Encounters:  12/02/18 98.1 F (36.7 C) (Oral)   Lab Results  Component Value Date   WBC 10.7 (H) 12/01/2018   Lab Results  Component Value Date   CREATININE 0.89 12/01/2018   Lab Results  Component Value Date   CRP 1.8 (H) 12/02/2018     Review of Systems: ROS  Allergies  Allergen Reactions  . Lipitor [Atorvastatin] Other (See Comments)    Aching, MS  . Zocor [Simvastatin-High Dose] Other (See Comments)    MS aches  . Celexa [Citalopram Hydrobromide] Other (See Comments)    Nausea,blurred vision  . Crestor [Rosuvastatin]     Pain, aching muscles  . Mobic [Meloxicam] Other (See Comments)    nausea  . Prozac [Fluoxetine Hcl] Other (See Comments)    fatigue    OBJECTIVE: Vitals:   12/02/18 0014 12/02/18 0409 12/02/18 0737 12/02/18 1301  BP: 111/61 106/70 104/62 (!) 104/51  Pulse: 67 70 70 66  Resp: 16 17 18 18   Temp: 98.2 F (36.8 C) 98.1 F (36.7 C) 98.4 F (36.9 C) 98.1 F (36.7 C)  TempSrc: Oral Oral Oral Oral  SpO2: 99% 97% 97% 97%  Weight:      Height:       Body mass index is  26.31 kg/m.  Physical Exam Constitutional:      Comments: Sitting in recliner in no distress.   Cardiovascular:     Rate and Rhythm: Normal rate and regular rhythm.     Heart sounds: No murmur.  Pulmonary:     Effort: Pulmonary effort is normal.  Musculoskeletal:     Comments: Left foot in cast. Normal sensation to toes and shin. No erythema or swelling observed.   Skin:    General: Skin is warm and dry.  Neurological:     Mental Status: He is alert.     Lab Results Lab Results  Component Value Date   WBC 10.7 (H) 12/01/2018   HGB 9.4 (L) 12/01/2018   HCT 30.5 (L) 12/01/2018   MCV 74.6 (L) 12/01/2018   PLT 342 12/01/2018    Lab Results  Component Value Date   CREATININE 0.89 12/01/2018   BUN 9 12/01/2018   NA 126 (L) 12/01/2018   K 3.8 12/01/2018   CL 87 (L) 12/01/2018   CO2 26 12/01/2018    Lab Results  Component Value Date   ALT 6 01/11/2018    AST 17 01/11/2018   ALKPHOS 91 01/11/2018   BILITOT 0.5 01/11/2018     Microbiology: Recent Results (from the past 240 hour(s))  SARS CORONAVIRUS 2 (TAT 6-24 HRS) Nasopharyngeal Nasopharyngeal Swab     Status: None   Collection Time: 11/29/18  8:39 AM   Specimen: Nasopharyngeal Swab  Result Value Ref Range Status   SARS Coronavirus 2 NEGATIVE NEGATIVE Final    Comment: (NOTE) SARS-CoV-2 target nucleic acids are NOT DETECTED. The SARS-CoV-2 RNA is generally detectable in upper and lower respiratory specimens during the acute phase of infection. Negative results do not preclude SARS-CoV-2 infection, do not rule out co-infections with other pathogens, and should not be used as the sole basis for treatment or other patient management decisions. Negative results must be combined with clinical observations, patient history, and epidemiological information. The expected result is Negative. Fact Sheet for Patients: SugarRoll.be Fact Sheet for Healthcare Providers: https://www.woods-mathews.com/ This test is not yet approved or cleared by the [place] FDA and  has been authorized for detection and/or diagnosis of SARS-CoV-2 by FDA under an Emergency Use Authorization (EUA). This EUA will remain  in effect (meaning this test can be used) for the duration of the COVID-19 declaration under Section 56 4(b)(1) of the Act, 21 U.S.C. section 360bbb-3(b)(1), unless the authorization is terminated or revoked sooner. Performed at [facility], [place] [address], [place], [place] [address]   Aerobic/Anaerobic Culture (surgical/deep wound)     Status: None (Preliminary result)   Collection Time: 12/01/18  1:50 PM   Specimen: Soft Tissue, Other  Result Value Ref Range Status   Specimen Description TISSUE LEFT ANKLE  Final   Special Requests NONE  Final   Gram Stain   Final    FEW WBC PRESENT,BOTH PMN AND MONONUCLEAR NO ORGANISMS SEEN     Culture   Final    NO GROWTH < 24 HOURS Performed at [facility], [place] [address] [place], [place] [address]    Report Status PENDING  Incomplete  Culture, blood (Routine X 2) w Reflex to ID Panel     Status: None (Preliminary result)   Collection Time: 12/01/18  4:33 PM   Specimen: BLOOD RIGHT ARM  Result Value Ref Range Status   Specimen Description BLOOD RIGHT ARM  Final   Special Requests   Final  BOTTLES DRAWN AEROBIC ONLY Blood Culture adequate volume   Culture   Final    NO GROWTH < 24 HOURS Performed at [facility], [place] [address], [place], [place] [address]    Report Status PENDING  Incomplete  Culture, blood (Routine X 2) w Reflex to ID Panel     Status: None (Preliminary result)   Collection Time: 12/01/18  4:33 PM   Specimen: BLOOD RIGHT ARM  Result Value Ref Range Status   Specimen Description BLOOD RIGHT ARM  Final   Special Requests   Final    BOTTLES DRAWN AEROBIC ONLY Blood Culture adequate volume   Culture   Final    NO GROWTH < 24 HOURS Performed at [facility], [place] [address] [place], [place] [address]    Report Status PENDING  Incomplete    Janene Madeira, MSN, NP-C [place] for Infectious Disease [facility] Cell: [phone] Pager: [phone]  @TODAY @ 2:59 PM

## 2018-12-02 NOTE — Progress Notes (Signed)
Subjective: 1 Day Post-Op Procedure(s) (LRB): Left calcaneus, talus, and tibia removal of deep implants; irrigation and excisional debridement (Left) Patient reports pain as moderate.  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  He asks for something stronger.  ID consult note reviewed.  I'm unaware of amoxicillin referenced in the note.  By my history he has not been on any abx for months.  IV abx in surgery were held until deep tissue cultures were obtained.  Objective: Vital signs in last 24 hours: Temp:  [97.5 F (36.4 C)-98.4 F (36.9 C)] 98.4 F (36.9 C) (09/11 0737) Pulse Rate:  [63-76] 70 (09/11 0737) Resp:  [10-18] 18 (09/11 0737) BP: (104-152)/(60-101) 104/62 (09/11 0737) SpO2:  [97 %-100 %] 97 % (09/11 0737) Weight:  [76.2 kg] 76.2 kg (09/10 1023)  Intake/Output from previous day: 09/10 0701 - 09/11 0700 In: 1566.4 [I.V.:1466.4; IV Piggyback:100] Out: 605 [Urine:600; Blood:5] Intake/Output this shift: No intake/output data recorded.  Recent Labs    12/01/18 1029 12/01/18 1836  HGB 9.1* 9.4*   Recent Labs    12/01/18 1029 12/01/18 1836  WBC 6.8 10.7*  RBC 4.01* 4.09*  HCT 30.4* 30.5*  PLT 334 342   Recent Labs    12/01/18 1029 12/01/18 1836  NA 126*  --   K 3.8  --   CL 87*  --   CO2 26  --   BUN 9  --   CREATININE 0.99 0.89  GLUCOSE 97  --   CALCIUM 8.8*  --    No results for input(s): LABPT, INR in the last 72 hours.  WN WD male resting comfortably and not apparently hurting.  L LE splint intact.  Brisk cap refill at the toes.   Assessment/Plan: 1 Day Post-Op Procedure(s) (LRB): Left calcaneus, talus, and tibia removal of deep implants; irrigation and excisional debridement (Left)  Continue IV abx per ID.  PT, OT for ambulation NWB on  LLE.      Wylene Simmer 12/02/2018, 7:38 AM

## 2018-12-02 NOTE — Evaluation (Signed)
Physical Therapy Evaluation Patient Details Name: Jason Woods MRN: [id number] DOB: 1945/05/01 Today's Date: 12/02/2018   History of Present Illness  Pt is a 73 y.o. male with L ankle pain after attempted fusion 12/2017, imaging shows infected nonunion, pt now admitted 12/01/18 s/p removal of deep implants of L calcaneus, talus, tibia. PMH includes DM, arthritis, chronic back pain, HTN, legally blind.    Clinical Impression  Pt presents with an overall decrease in functional mobility secondary to above. PTA, pt indep and lives with brother; daughter available for assist, owns necessary DME from prior sx. Educ on precautions, positioning, therex, and importance of mobility. Today, pt able to initiate transfer and gait training with RW, good ability to maintain NWB precautions. Pt moving very well; will likely only require one more PT session to maximize functional mobility and assessment prior to d/c home.    Follow Up Recommendations No PT follow up;Supervision - Intermittent    Equipment Recommendations  None recommended by PT    Recommendations for Other Services       Precautions / Restrictions Precautions Precautions: Fall Restrictions Weight Bearing Restrictions: Yes LLE Weight Bearing: Non weight bearing      Mobility  Bed Mobility Overal bed mobility: Modified Independent             General bed mobility comments: HOB up  Transfers Overall transfer level: Needs assistance Equipment used: Rolling walker (2 wheeled) Transfers: Sit to/from Stand Sit to Stand: Supervision         General transfer comment: cues for hand placement, supervision for safety standing from bed and BSC (over toilet)  Ambulation/Gait Ambulation/Gait assistance: Supervision;Min guard Gait Distance (Feet): 60 Feet Assistive device: Rolling walker (2 wheeled) Gait Pattern/deviations: Step-to pattern Gait velocity: Decreased   General Gait Details: Slow, steady gait with RW and  intermittent min guard for balance; 1x seated rest break in bathroom. Great ability to maintain LLE NWB precautions  Stairs            Wheelchair Mobility    Modified Rankin (Stroke Patients Only)       Balance Overall balance assessment: Needs assistance   Sitting balance-Leahy Scale: Good       Standing balance-Leahy Scale: Poor Standing balance comment: Reliant on UE support                             Pertinent Vitals/Pain Pain Assessment: Faces Faces Pain Scale: Hurts little more Pain Location: L LE Pain Descriptors / Indicators: Sore Pain Intervention(s): Monitored during session    Home Living Family/patient expects to be discharged to:: Private residence Living Arrangements: Other relatives(brother) Available Help at Discharge: Family;Available PRN/intermittently Type of Home: House Home Access: Ramped entrance     Home Layout: One level Home Equipment: Walker - 2 wheels;Wheelchair - [employer];Shower seat Additional Comments: Daughter lives nearby and available for assist as needed    Prior Function Level of Independence: Independent         Comments: does not drive     Hand Dominance   Dominant Hand: Right    Extremity/Trunk Assessment   Upper Extremity Assessment Upper Extremity Assessment: Overall WFL for tasks assessed    Lower Extremity Assessment Lower Extremity Assessment: LLE deficits/detail LLE Deficits / Details: Hip and knee WFL; s/p ankle repair    Cervical / Trunk Assessment Cervical / Trunk Assessment: Normal  Communication   Communication: No difficulties  Cognition Arousal/Alertness: Awake/alert Behavior During  Therapy: WFL for tasks assessed/performed Overall Cognitive Status: Within Functional Limits for tasks assessed                                        General Comments      Exercises Other Exercises Other Exercises: Reviewed hip/knee ROM therex    Assessment/Plan    PT Assessment Patient needs continued PT services  PT Problem List Decreased strength;Decreased range of motion;Decreased activity tolerance;Decreased balance;Decreased mobility;Pain       PT Treatment Interventions DME instruction;Gait training;Functional mobility training;Therapeutic activities;Therapeutic exercise;Balance training;Patient/family education    PT Goals (Current goals can be found in the Care Plan section)  Acute Rehab PT Goals Patient Stated Goal: return home with assist of his brother and daughter PT Goal Formulation: With patient Time For Goal Achievement: 12/16/18 Potential to Achieve Goals: Good    Frequency Min 5X/week   Barriers to discharge        Co-evaluation               AM-PAC PT "6 Clicks" Mobility  Outcome Measure Help needed turning from your back to your side while in a flat bed without using bedrails?: None Help needed moving from lying on your back to sitting on the side of a flat bed without using bedrails?: None Help needed moving to and from a bed to a chair (including a wheelchair)?: None Help needed standing up from a chair using your arms (e.g., wheelchair or bedside chair)?: None Help needed to walk in hospital room?: A Little Help needed climbing 3-5 steps with a railing? : A Little 6 Click Score: 22    End of Session Equipment Utilized During Treatment: Gait belt Activity Tolerance: Patient tolerated treatment well Patient left: in chair;with call bell/phone within reach;with chair alarm set Nurse Communication: Mobility status PT Visit Diagnosis: Other abnormalities of gait and mobility (R26.89)    Time: 1051-1110 PT Time Calculation (min) (ACUTE ONLY): 19 min   Charges:   PT Evaluation $PT Eval Moderate Complexity: [place], PT, DPT Acute Rehabilitation Services  Pager [phone] Office [place] 12/02/2018, 12:44 PM

## 2018-12-03 DIAGNOSIS — Z886 Allergy status to analgesic agent status: Secondary | ICD-10-CM

## 2018-12-03 DIAGNOSIS — Z888 Allergy status to other drugs, medicaments and biological substances status: Secondary | ICD-10-CM

## 2018-12-03 DIAGNOSIS — S82852K Displaced trimalleolar fracture of left lower leg, subsequent encounter for closed fracture with nonunion: Secondary | ICD-10-CM

## 2018-12-03 DIAGNOSIS — X58XXXD Exposure to other specified factors, subsequent encounter: Secondary | ICD-10-CM

## 2018-12-03 LAB — BASIC METABOLIC PANEL
Anion gap: 10 (ref 5–15)
BUN: 8 mg/dL (ref 8–23)
CO2: 25 mmol/L (ref 22–32)
Calcium: 8.3 mg/dL — ABNORMAL LOW (ref 8.9–10.3)
Chloride: 93 mmol/L — ABNORMAL LOW (ref 98–111)
Creatinine, Ser: 0.84 mg/dL (ref 0.61–1.24)
GFR calc Af Amer: >60 mL/min (ref >60)
GFR calc non Af Amer: >60 mL/min (ref >60)
Glucose, Bld: 123 mg/dL — ABNORMAL HIGH (ref 70–99)
Potassium: 3.9 mmol/L (ref 3.5–5.1)
Sodium: 128 mmol/L — ABNORMAL LOW (ref 135–145)

## 2018-12-03 LAB — GLUCOSE, CAPILLARY
Glucose-Capillary: 129 mg/dL — ABNORMAL HIGH (ref 70–99)
Glucose-Capillary: 129 mg/dL — ABNORMAL HIGH (ref 70–99)
Glucose-Capillary: 107 mg/dL — ABNORMAL HIGH (ref 70–99)
Glucose-Capillary: 133 mg/dL — ABNORMAL HIGH (ref 70–99)

## 2018-12-03 NOTE — Progress Notes (Addendum)
Physical Therapy Treatment Patient Details Name: Jason Woods MRN: [id number] DOB: 15-Jul-1945 Today's Date: 12/03/2018    History of Present Illness Pt is a 73 y.o. male with L ankle pain after attempted fusion 12/2017, imaging shows infected nonunion, pt now admitted 12/01/18 s/p removal of deep implants of L calcaneus, talus, tibia. PMH includes DM, arthritis, chronic back pain, HTN, legally blind.    PT Comments    Patient seen for mobility progression. Pt tolerated session without increased pain. This session focused on functional transfer/gait training and LE therex. Pt overall requires supervision/min guard for OOB mobility and is able to maintain NWB status. Continue to progress as tolerated.     Follow Up Recommendations  No PT follow up;Supervision - Intermittent     Equipment Recommendations  Pt has w/c at home from previous admission (he reports ~a year ago) but needs elevating leg rests if this is an option.   Recommendations for Other Services       Precautions / Restrictions Precautions Precautions: Fall Restrictions Weight Bearing Restrictions: Yes LLE Weight Bearing: Non weight bearing    Mobility  Bed Mobility               General bed mobility comments: pt OOB in chair upon arrival  Transfers Overall transfer level: Needs assistance Equipment used: Rolling walker (2 wheeled) Transfers: Sit to/from Stand Sit to Stand: Min guard;Supervision         General transfer comment: cues for hand placement  Ambulation/Gait Ambulation/Gait assistance: Min Gaffer (Feet): 60 Feet Assistive device: Rolling walker (2 wheeled) Gait Pattern/deviations: Step-to pattern Gait velocity: Decreased   General Gait Details: good adherence to weight bearing precautions; bilat UE fatigue limiting distance; min guard for turning/directional changes   Stairs             Wheelchair Mobility    Modified Rankin (Stroke Patients  Only)       Balance Overall balance assessment: Needs assistance   Sitting balance-Leahy Scale: Good       Standing balance-Leahy Scale: Poor Standing balance comment: Reliant on UE support                            Cognition Arousal/Alertness: Awake/alert Behavior During Therapy: WFL for tasks assessed/performed Overall Cognitive Status: Within Functional Limits for tasks assessed                                        Exercises General Exercises - Lower Extremity Long Arc Quad: AROM;Left;20 reps;Seated Straight Leg Raises: AROM;Left;15 reps    General Comments        Pertinent Vitals/Pain Pain Assessment: 0-10 Pain Score: 4  Pain Location: LLE Pain Descriptors / Indicators: Throbbing;Sore Pain Intervention(s): Limited activity within patient's tolerance;Monitored during session;Premedicated before session;Repositioned    Home Living                      Prior Function            PT Goals (current goals can now be found in the care plan section) Acute Rehab PT Goals Patient Stated Goal: not stated Progress towards PT goals: Progressing toward goals    Frequency    Min 5X/week      PT Plan Current plan remains appropriate    Co-evaluation  AM-PAC PT "6 Clicks" Mobility   Outcome Measure  Help needed turning from your back to your side while in a flat bed without using bedrails?: None Help needed moving from lying on your back to sitting on the side of a flat bed without using bedrails?: None Help needed moving to and from a bed to a chair (including a wheelchair)?: None Help needed standing up from a chair using your arms (e.g., wheelchair or bedside chair)?: None Help needed to walk in hospital room?: A Little Help needed climbing 3-5 steps with a railing? : A Little 6 Click Score: 22    End of Session Equipment Utilized During Treatment: Gait belt Activity Tolerance: Patient tolerated  treatment well Patient left: in chair;with call bell/phone within reach Nurse Communication: Mobility status PT Visit Diagnosis: Other abnormalities of gait and mobility (R26.89)     Time: [id number] PT Time Calculation (min) (ACUTE ONLY): 25 min  Charges:  $Gait Training: 23-37 mins                     Earney Navy, PTA Acute Rehabilitation Services Pager: [phone] Office: [phone]     Darliss Cheney 12/03/2018, 11:48 AM

## 2018-12-03 NOTE — Progress Notes (Signed)
Jason Woods  MRN: [id number] DOB/Age: 04/24/1945 73 y.o. Physician: Ander Slade, Woods.D. 2 Days Post-Op Procedure(s) (LRB): Left calcaneus, talus, and tibia removal of deep implants; irrigation and excisional debridement (Left)  Subjective: Patient resting comfortably in bedside chair.  Left leg elevated.  Reports minimal ankle pain. Vital Signs Temp:  [97.8 F (36.6 C)-98.2 F (36.8 C)] 98.2 F (36.8 C) (09/12 0755) Pulse Rate:  [66-77] 77 (09/12 0755) Resp:  [15-19] 15 (09/12 0755) BP: (104-124)/(51-66) 113/55 (09/12 0755) SpO2:  [97 %-100 %] 99 % (09/12 0755)  Lab Results Recent Labs    12/01/18 1029 12/01/18 1836  WBC 6.8 10.7*  HGB 9.1* 9.4*  HCT 30.4* 30.5*  PLT 334 342   BMET Recent Labs    12/01/18 1029 12/01/18 1836 12/03/18 0535  NA 126*  --  128*  K 3.8  --  3.9  CL 87*  --  93*  CO2 26  --  25  GLUCOSE 97  --  123*  BUN 9  --  8  CREATININE 0.99 0.89 0.84  CALCIUM 8.8*  --  8.3*   No results found for: INR   Exam  Left ankle splinted.  Dressings appear clean and dry.  Remains grossly neurovascular intact.  Intraoperative cultures remain no growth at this time.  Plan Continue with current IV antibiotic regimen per infectious disease. Jason Woods Jason Woods 12/03/2018, 10:25 AM    Contact # [phone]

## 2018-12-03 NOTE — Progress Notes (Signed)
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 No growth from intra op cultures yet (likely due to pt taking 1 gm of PO amoxicillin the evening before debridement for a "toothache."). Would continue vancomycin + cefepime pending final culture data. Would place PICC tomorrow if blood cultures remain negative.   I had an extremely lengthy discussion regarding recommended therapy with PICC line and IV antibiotics for treatment for planned course 6 weeks with consideration for oral therapy thereafter. I continued to stress to the patient why his wound cxs were negative in the setting of purulence encountered intra-operatively, but he refuses to accept this explanation. He also expresses concern whether he can self-administer IV ABX at home successfully.  PLAN: 1. Continue vancomycin and cefepime empirically. 2. Follow wound cxs 3. Recommend PICC placement for IV ABX but pt's continued reluctance and negative wound cxs may require alternative approach if he refuses this treatment. May have to discuss with orthopedist on Monday.    Active Problems:   Trimalleolar fracture of ankle, closed, left, with nonunion, subsequent encounter   . ALPRAZolam  1 mg Oral TID  . B-complex with vitamin C  1 tablet Oral Daily  . buprenorphine-naloxone  1 tablet Sublingual Daily  . cholecalciferol  2,000 Units Oral Daily  . docusate sodium  100 mg Oral BID  . dorzolamide  1 drop Both Eyes TID  . DULoxetine  30 mg Oral Daily  . enoxaparin (LOVENOX) injection  40 mg Subcutaneous Q24H  . fluticasone  2 spray Each Nare Daily  . gabapentin  400 mg Oral BID  . hydrochlorothiazide  25 mg Oral Daily  . hydrOXYzine  25 mg Oral QHS  . insulin aspart  0-15 Units  Subcutaneous TID WC  . latanoprost  1 drop Right Eye QHS  . losartan  25 mg Oral Daily  . metFORMIN  850 mg Oral BID WC  . metoprolol succinate  25 mg Oral Daily  . multivitamin with minerals  1 tablet Oral Daily  . nicotine  14 mg Transdermal Q24H  . pantoprazole  40 mg Oral Daily  . senna  1 tablet Oral BID  . cyanocobalamin  2,000 mcg Oral Daily    SUBJECTIVE: Operative cxs remain negative thus far. +BM this AM. Appetite remains fair. Only ambulation with PT as weight bearing restrictions allow.  Temp Readings from Last 1 Encounters:  12/03/18 98.2 F (36.8 C) (Oral)   Lab Results  Component Value Date   WBC 10.7 (H) 12/01/2018   Lab Results  Component Value Date   CREATININE 0.84 12/03/2018   Lab Results  Component Value Date   CRP 1.8 (H) 12/02/2018     Review of Systems: Review of Systems  Constitutional: Negative for chills, fever and weight loss.  HENT: Negative for congestion, hearing loss, sinus pain and sore throat.   Eyes: Negative for blurred vision, photophobia and discharge.  Respiratory: Negative for cough, hemoptysis and shortness of breath.   Cardiovascular: Negative for chest pain, palpitations, orthopnea and leg swelling.  Gastrointestinal: Negative for abdominal pain, constipation, diarrhea, heartburn, nausea and vomiting.  Genitourinary: Negative for dysuria, flank pain, frequency and urgency.  Musculoskeletal: Positive for joint pain.  Negative for back pain and myalgias.       LT ankle  Skin: Negative for itching and rash.  Neurological: Negative for tremors, seizures, weakness and headaches.  Endo/Heme/Allergies: Negative for polydipsia. Does not bruise/bleed easily.  Psychiatric/Behavioral: Negative for depression and substance abuse. The patient is not nervous/anxious and does not have insomnia.     Allergies  Allergen Reactions  . Lipitor [Atorvastatin] Other (See Comments)    Aching, Jason Woods  . Zocor [Simvastatin-High Dose] Other (See  Comments)    Jason Woods aches  . Celexa [Citalopram Hydrobromide] Other (See Comments)    Nausea,blurred vision  . Crestor [Rosuvastatin]     Pain, Jason Woods muscles  . Mobic [Meloxicam] Other (See Comments)    nausea  . Prozac [Fluoxetine Hcl] Other (See Comments)    fatigue    OBJECTIVE: Vitals:   12/02/18 1301 12/02/18 2058 12/03/18 0520 12/03/18 0755  BP: (!) 104/51 124/66 124/62 (!) 113/55  Pulse: 66 75 77 77  Resp: 18 19 16 15   Temp: 98.1 F (36.7 C) 97.9 F (36.6 C) 97.8 F (36.6 C) 98.2 F (36.8 C)  TempSrc: Oral Oral Oral Oral  SpO2: 97% 100% 98% 99%  Weight:      Height:       Body mass index is 26.31 kg/m.  Physical Exam Constitutional:      Comments: Sitting in recliner in no distress.   Cardiovascular:     Rate and Rhythm: Normal rate and regular rhythm.     Heart sounds: No murmur.  Pulmonary:     Effort: Pulmonary effort is normal.  Musculoskeletal:     Comments: Left foot in cast. Normal sensation to toes and shin. No erythema or swelling observed.   Skin:    General: Skin is warm and dry.  Neurological:     Mental Status: He is alert.     Lab Results Lab Results  Component Value Date   WBC 10.7 (H) 12/01/2018   HGB 9.4 (L) 12/01/2018   HCT 30.5 (L) 12/01/2018   MCV 74.6 (L) 12/01/2018   PLT 342 12/01/2018    Lab Results  Component Value Date   CREATININE 0.84 12/03/2018   BUN 8 12/03/2018   NA 128 (L) 12/03/2018   K 3.9 12/03/2018   CL 93 (L) 12/03/2018   CO2 25 12/03/2018    Lab Results  Component Value Date   ALT 6 01/11/2018   AST 17 01/11/2018   ALKPHOS 91 01/11/2018   BILITOT 0.5 01/11/2018     Microbiology: Recent Results (from the past 240 hour(s))  SARS CORONAVIRUS 2 (TAT 6-24 HRS) Nasopharyngeal Nasopharyngeal Swab     Status: None   Collection Time: 11/29/18  8:39 AM   Specimen: Nasopharyngeal Swab  Result Value Ref Range Status   SARS Coronavirus 2 NEGATIVE NEGATIVE Final    Comment: (NOTE) SARS-CoV-2 target nucleic  acids are NOT DETECTED. The SARS-CoV-2 RNA is generally detectable in upper and lower respiratory specimens during the acute phase of infection. Negative results do not preclude SARS-CoV-2 infection, do not rule out co-infections with other pathogens, and should not be used as the sole basis for treatment or other patient management decisions. Negative results must be combined with clinical observations, patient history, and epidemiological information. The expected result is Negative. Fact Sheet for Patients: SugarRoll.be Fact Sheet for Healthcare Providers: https://www.woods-mathews.com/ This test is not yet approved or cleared by the [place] FDA and  has been authorized for detection and/or diagnosis of SARS-CoV-2 by  FDA under an Emergency Use Authorization (EUA). This EUA will remain  in effect (meaning this test can be used) for the duration of the COVID-19 declaration under Section 56 4(b)(1) of the Act, 21 U.S.C. section 360bbb-3(b)(1), unless the authorization is terminated or revoked sooner. Performed at [facility], [place] [address], [place], [place] [address]   Aerobic/Anaerobic Culture (surgical/deep wound)     Status: None (Preliminary result)   Collection Time: 12/01/18  1:50 PM   Specimen: Soft Tissue, Other  Result Value Ref Range Status   Specimen Description TISSUE LEFT ANKLE  Final   Special Requests NONE  Final   Gram Stain   Final    FEW WBC PRESENT,BOTH PMN AND MONONUCLEAR NO ORGANISMS SEEN    Culture   Final    NO GROWTH 2 DAYS NO ANAEROBES ISOLATED; CULTURE IN PROGRESS FOR 5 DAYS Performed at [facility], [place] [address], [place], [place] [address]    Report Status PENDING  Incomplete  Culture, blood (Routine X 2) w Reflex to ID Panel     Status: None (Preliminary result)   Collection Time: 12/01/18  4:33 PM   Specimen: BLOOD RIGHT ARM  Result Value Ref Range Status   Specimen Description BLOOD  RIGHT ARM  Final   Special Requests   Final    BOTTLES DRAWN AEROBIC ONLY Blood Culture adequate volume   Culture   Final    NO GROWTH 2 DAYS Performed at [facility], [place] [address] [place], [place] [address]    Report Status PENDING  Incomplete  Culture, blood (Routine X 2) w Reflex to ID Panel     Status: None (Preliminary result)   Collection Time: 12/01/18  4:33 PM   Specimen: BLOOD RIGHT ARM  Result Value Ref Range Status   Specimen Description BLOOD RIGHT ARM  Final   Special Requests   Final    BOTTLES DRAWN AEROBIC ONLY Blood Culture adequate volume   Culture   Final    NO GROWTH 2 DAYS Performed at [facility], [place] [address], [place], [place] [address]    Report Status PENDING  Incomplete    Catalina Antigua, MD [facility]  @TODAY @ 4:48 PM

## 2018-12-03 NOTE — Progress Notes (Signed)
Occupational Therapy Treatment Patient Details Name: Jason Woods MRN: [id number] DOB: 18-Jun-1945 Today's Date: 12/03/2018    History of present illness Pt is a 73 y.o. male with L ankle pain after attempted fusion 12/2017, imaging shows infected nonunion, pt now admitted 12/01/18 s/p removal of deep implants of L calcaneus, talus, tibia. PMH includes DM, arthritis, chronic back pain, HTN, legally blind.   OT comments  Pt did well in session. Education provided.  Follow Up Recommendations  No OT follow up    Equipment Recommendations  None recommended by OT    Recommendations for Other Services      Precautions / Restrictions Precautions Precautions: Fall Restrictions Weight Bearing Restrictions: Yes LLE Weight Bearing: Non weight bearing       Mobility Bed Mobility               General bed mobility comments: not assessed  Transfers Overall transfer level: Needs assistance Equipment used: Rolling walker (2 wheeled) Transfers: Sit to/from Stand Sit to Stand: Min assist(Supervision-sit to stand and Min A-stand to sit)              Balance  Min A for stand to sit transfer.                                          ADL either performed or assessed with clinical judgement   ADL Overall ADL's : Needs assistance/impaired                     Lower Body Dressing: Min guard;Sit to/from stand   Toilet Transfer: Minimal assistance;Ambulation;RW Toilet Transfer Details (indicate cue type and reason): assist for stand to sit to bed.         Functional mobility during ADLs: Minimal assistance;Rolling walker(assist for stand to sit to bed) General ADL Comments: Educated on tub transfer techniques, but pt planning to sponge bathe for now. Educated on LB dressing technique. Educated on safety such as items off floor and use of bag on walker. Cues for NWB on LLE. Pt ambulated in room and gathered his underwear-Min guard.      Vision        Perception     Praxis      Cognition Arousal/Alertness: Awake/alert Behavior During Therapy: WFL for tasks assessed/performed Overall Cognitive Status: Within Functional Limits for tasks assessed                                          Exercises     Shoulder Instructions       General Comments      Pertinent Vitals/ Pain       Pain Assessment: 0-10 Pain Score: 5  Pain Location: LLE Pain Descriptors / Indicators: Throbbing;Sore Pain Intervention(s): Monitored during session  Home Living                                          Prior Functioning/Environment              Frequency  Min 2X/week        Progress Toward Goals  OT Goals(current goals can now be found in the care plan section)  Progress towards OT goals: Progressing toward goals  Acute Rehab OT Goals Patient Stated Goal: not stated OT Goal Formulation: With patient Time For Goal Achievement: 12/16/18 Potential to Achieve Goals: Good ADL Goals Pt Will Perform Lower Body Bathing: with modified independence;sit to/from stand Pt Will Perform Lower Body Dressing: with modified independence;sit to/from stand Pt Will Transfer to Toilet: with modified independence;ambulating;bedside commode(over toilet) Pt Will Perform Toileting - Clothing Manipulation and hygiene: with modified independence;sit to/from stand Additional ADL Goal #1: Pt will gather items necessary for ADL around his room modified independently with RW.  Plan Discharge plan remains appropriate    Co-evaluation                 AM-PAC OT "6 Clicks" Daily Activity     Outcome Measure   Help from another person eating meals?: None Help from another person taking care of personal grooming?: A Little Help from another person toileting, which includes using toliet, bedpan, or urinal?: A Little Help from another person bathing (including washing, rinsing, drying)?: A Little Help from  another person to put on and taking off regular upper body clothing?: A Little Help from another person to put on and taking off regular lower body clothing?: A Little 6 Click Score: 19    End of Session Equipment Utilized During Treatment: Gait belt;Rolling walker  OT Visit Diagnosis: Pain Pain - Right/Left: Left Pain - part of body: Leg   Activity Tolerance Patient tolerated treatment well   Patient Left in chair;with nursing/sitter in room   Nurse Communication          Time: [id number] OT Time Calculation (min): 15 min  Charges: OT General Charges $OT Visit: 1 Visit OT Treatments $Self Care/Home Management : 8-22 mins    Ercel Normoyle L Jhovani Griswold OTR/L 12/03/2018, 9:10 AM

## 2018-12-03 NOTE — Plan of Care (Signed)
  Problem: Pain Managment: Goal: General experience of comfort will improve Outcome: Progressing   Problem: Safety: Goal: Ability to remain free from injury will improve Outcome: Progressing   

## 2018-12-04 LAB — CBC WITH DIFFERENTIAL/PLATELET
Abs Immature Granulocytes: 0.01 10*3/uL (ref 0.00–0.07)
Basophils Absolute: 0 10*3/uL (ref 0.0–0.1)
Basophils Relative: 1 %
Eosinophils Absolute: 0.2 10*3/uL (ref 0.0–0.5)
Eosinophils Relative: 3 %
HCT: 27.8 % — ABNORMAL LOW (ref 39.0–52.0)
Hemoglobin: 8.4 g/dL — ABNORMAL LOW (ref 13.0–17.0)
Immature Granulocytes: 0 %
Lymphocytes Relative: 21 %
Lymphs Abs: 1 10*3/uL (ref 0.7–4.0)
MCH: 23 pg — ABNORMAL LOW (ref 26.0–34.0)
MCHC: 30.2 g/dL (ref 30.0–36.0)
MCV: 76.2 fL — ABNORMAL LOW (ref 80.0–100.0)
Monocytes Absolute: 0.6 10*3/uL (ref 0.1–1.0)
Monocytes Relative: 13 %
Neutro Abs: 3 10*3/uL (ref 1.7–7.7)
Neutrophils Relative %: 62 %
Platelets: 276 10*3/uL (ref 150–400)
RBC: 3.65 MIL/uL — ABNORMAL LOW (ref 4.22–5.81)
RDW: 16.3 % — ABNORMAL HIGH (ref 11.5–15.5)
WBC: 4.8 10*3/uL (ref 4.0–10.5)
nRBC: 0 % (ref 0.0–0.2)

## 2018-12-04 LAB — GLUCOSE, CAPILLARY
Glucose-Capillary: 169 mg/dL — ABNORMAL HIGH (ref 70–99)
Glucose-Capillary: 130 mg/dL — ABNORMAL HIGH (ref 70–99)
Glucose-Capillary: 120 mg/dL — ABNORMAL HIGH (ref 70–99)
Glucose-Capillary: 131 mg/dL — ABNORMAL HIGH (ref 70–99)

## 2018-12-04 MED ORDER — MUPIROCIN 2 % EX OINT
TOPICAL_OINTMENT | CUTANEOUS | Status: AC
  Administered 2018-12-04: 14:00:00
  Filled 2018-12-04: qty 22

## 2018-12-04 MED ORDER — ACETAMINOPHEN 500 MG PO TABS
1000.0000 mg | ORAL_TABLET | Freq: Four times a day (QID) | ORAL | Status: DC | PRN
  Administered 2018-12-04 – 2018-12-05 (×2): 1000 mg via ORAL
  Filled 2018-12-04 (×2): qty 2

## 2018-12-04 NOTE — Progress Notes (Signed)
Physical Therapy Treatment Patient Details Name: Jason Woods MRN: [id number] DOB: 04-16-1945 Today's Date: 12/04/2018    History of Present Illness Pt is a 73 y.o. male with L ankle pain after attempted fusion 12/2017, imaging shows infected nonunion, pt now admitted 12/01/18 s/p removal of deep implants of L calcaneus, talus, tibia. PMH includes DM, arthritis, chronic back pain, HTN, legally blind.    PT Comments    Pt making good progress towards his physical therapy goals. Able to tolerate standing therapeutic exercises for strengthening, ambulating 25 feet with walker with good adherence to weightbearing precautions. Pt requesting HHPT at discharge, updated plan.     Follow Up Recommendations  Supervision - Intermittent;Home health PT     Equipment Recommendations  None recommended by PT    Recommendations for Other Services       Precautions / Restrictions Precautions Precautions: Fall Restrictions Weight Bearing Restrictions: Yes LLE Weight Bearing: Non weight bearing    Mobility  Bed Mobility               General bed mobility comments: pt OOB in chair upon arrival  Transfers Overall transfer level: Needs assistance Equipment used: Rolling walker (2 wheeled) Transfers: Sit to/from Stand Sit to Stand: Supervision            Ambulation/Gait Ambulation/Gait assistance: Min guard;Min assist Gait Distance (Feet): 25 Feet Assistive device: Rolling walker (2 wheeled) Gait Pattern/deviations: Step-to pattern Gait velocity: Decreased   General Gait Details: good adherence to weight bearing precautions; bilat UE fatigue limiting distance; one episode requiring min assist to due to posterior LOB with turning   Stairs             Wheelchair Mobility    Modified Rankin (Stroke Patients Only)       Balance Overall balance assessment: Needs assistance   Sitting balance-Leahy Scale: Good       Standing balance-Leahy Scale:  Poor Standing balance comment: Reliant on UE support                            Cognition Arousal/Alertness: Awake/alert Behavior During Therapy: WFL for tasks assessed/performed Overall Cognitive Status: Within Functional Limits for tasks assessed                                        Exercises Other Exercises Other Exercises: Standing: RLE hip flexion, abduction, extension, hamstring curls x 10 each Other Exercises: Standing: left calf raises x 10    General Comments        Pertinent Vitals/Pain Pain Assessment: Faces Faces Pain Scale: Hurts a little bit Pain Location: LLE Pain Descriptors / Indicators: Throbbing;Sore Pain Intervention(s): Monitored during session    Home Living                      Prior Function            PT Goals (current goals can now be found in the care plan section) Acute Rehab PT Goals Patient Stated Goal: not stated Potential to Achieve Goals: Good Progress towards PT goals: Progressing toward goals    Frequency    Min 5X/week      PT Plan Discharge plan needs to be updated    Co-evaluation              AM-PAC PT "6 Clicks"  Mobility   Outcome Measure  Help needed turning from your back to your side while in a flat bed without using bedrails?: None Help needed moving from lying on your back to sitting on the side of a flat bed without using bedrails?: None Help needed moving to and from a bed to a chair (including a wheelchair)?: None Help needed standing up from a chair using your arms (e.g., wheelchair or bedside chair)?: None Help needed to walk in hospital room?: A Little Help needed climbing 3-5 steps with a railing? : A Little 6 Click Score: 22    End of Session Equipment Utilized During Treatment: Gait belt Activity Tolerance: Patient tolerated treatment well Patient left: in chair;with call bell/phone within reach Nurse Communication: Mobility status PT Visit Diagnosis:  Other abnormalities of gait and mobility (R26.89)     Time: [id number] PT Time Calculation (min) (ACUTE ONLY): 14 min  Charges:  $Therapeutic Exercise: 8-22 mins                     Ellamae Sia, PT, DPT Acute Rehabilitation Services Pager [phone] Office [phone]    Willy Eddy 12/04/2018, 3:39 PM

## 2018-12-04 NOTE — Progress Notes (Signed)
Occupational Therapy Treatment Patient Details Name: Jason Woods MRN: [id number] DOB: 28-Jul-1945 Today's Date: 12/04/2018    History of present illness Pt is a 73 y.o. male with L ankle pain after attempted fusion 12/2017, imaging shows infected nonunion, pt now admitted 12/01/18 s/p removal of deep implants of L calcaneus, talus, tibia. PMH includes DM, arthritis, chronic back pain, HTN, legally blind.   OT comments  Pt. Making great gains with skilled OT.  Able to complete toileting task, bed mobility, and ub/lb b/d in b.room.  Excellent at maintaining NWB without cues.    Follow Up Recommendations  No OT follow up    Equipment Recommendations  None recommended by OT    Recommendations for Other Services      Precautions / Restrictions Precautions Precautions: Fall Restrictions LLE Weight Bearing: Non weight bearing       Mobility Bed Mobility Overal bed mobility: Modified Independent             General bed mobility comments: hob flat, no rail, exited on L side  Transfers Overall transfer level: Needs assistance Equipment used: Rolling walker (2 wheeled) Transfers: Sit to/from [facility] Sit to Stand: Min guard;Supervision Stand pivot transfers: Min guard;Supervision       General transfer comment: cues for hand placement    Balance                                           ADL either performed or assessed with clinical judgement   ADL Overall ADL's : Needs assistance/impaired     Grooming: Wash/dry hands;Wash/dry face;Applying deodorant;Set up;Sitting   Upper Body Bathing: Set up;Sitting   Lower Body Bathing: Minimal assistance;Sit to/from stand           Toilet Transfer: Minimal assistance;Ambulation;RW   Toileting- Clothing Manipulation and Hygiene: Minimal assistance;Sit to/from stand Toileting - Clothing Manipulation Details (indicate cue type and reason): required some assistance for thoroughness  after BM, also to stabalize for balance secondary to NWB LLE     Functional mobility during ADLs: Minimal assistance;Rolling walker       Vision       Perception     Praxis      Cognition   Behavior During Therapy: WFL for tasks assessed/performed Overall Cognitive Status: Within Functional Limits for tasks assessed                                          Exercises     Shoulder Instructions       General Comments      Pertinent Vitals/ Pain       Pain Assessment: 0-10 Pain Score: 3  Pain Location: LLE Pain Descriptors / Indicators: Aching  Home Living                                          Prior Functioning/Environment              Frequency  Min 2X/week        Progress Toward Goals  OT Goals(current goals can now be found in the care plan section)  Progress towards OT goals: Progressing toward goals  Plan      Co-evaluation                 AM-PAC OT "6 Clicks" Daily Activity     Outcome Measure   Help from another person eating meals?: None Help from another person taking care of personal grooming?: A Little Help from another person toileting, which includes using toliet, bedpan, or urinal?: A Little Help from another person bathing (including washing, rinsing, drying)?: A Little Help from another person to put on and taking off regular upper body clothing?: A Little Help from another person to put on and taking off regular lower body clothing?: A Little 6 Click Score: 19    End of Session Equipment Utilized During Treatment: Gait belt;Rolling walker  OT Visit Diagnosis: Pain Pain - Right/Left: Left Pain - part of body: Leg   Activity Tolerance Patient tolerated treatment well   Patient Left in chair;with nursing/sitter in room   Nurse Communication Other (comment)(pt. requesting diet coke, checked with rn to see if allowed)        Time: [id number] OT Time Calculation (min): 23  min  Charges: OT General Charges $OT Visit: 1 Visit OT Treatments $Self Care/Home Management : 23-37 mins   Janice Coffin, COTA/L 12/04/2018, 11:05 AM

## 2018-12-04 NOTE — Progress Notes (Signed)
Jason Woods  MRN: [id number] DOB/Age: 1945-12-07 73 y.o. Physician: Ander Slade, M.D. 3 Days Post-Op Procedure(s) (LRB): Left calcaneus, talus, and tibia removal of deep implants; irrigation and excisional debridement (Left)  Subjective: Patient resting comfortably in bedside chair.  Left leg elevated.  Reports minimal ankle pain.  He reports no overnight events. Vital Signs Temp:  [97.8 F (36.6 C)-98.5 F (36.9 C)] 98.5 F (36.9 C) (09/13 0731) Pulse Rate:  [78-84] 83 (09/13 0731) Resp:  [15-18] 15 (09/13 0731) BP: (128-135)/(62-76) 128/76 (09/13 0731) SpO2:  [97 %-99 %] 97 % (09/13 0731)  Lab Results Recent Labs    12/01/18 1836 12/04/18 0426  WBC 10.7* 4.8  HGB 9.4* 8.4*  HCT 30.5* 27.8*  PLT 342 276   BMET Recent Labs    12/01/18 1029 12/01/18 1836 12/03/18 0535  NA 126*  --  128*  K 3.8  --  3.9  CL 87*  --  93*  CO2 26  --  25  GLUCOSE 97  --  123*  BUN 9  --  8  CREATININE 0.99 0.89 0.84  CALCIUM 8.8*  --  8.3*   No results found for: INR   Exam  Left ankle splinted.  Dressings appear clean and dry.  Remains grossly neurovascular intact.  Intraoperative cultures remain no growth at this time.  Blood cultures are NGTD x 3  Plan Continue with current IV antibiotic regimen per infectious disease. Going for PICC line today Will need HHRN for abx after dc likley dc tomorrow. Nicholes Stairs 12/04/2018, 9:35 AM    Contact # [phone]

## 2018-12-04 NOTE — Progress Notes (Signed)
Pharmacy Antibiotic Note  Jason Woods is a 73 y.o. male admitted on 12/01/2018 with.  Pharmacy has been consulted for vancomycin for osteomyelitis and cefepime dosing for bacteremia. Patients Scr stable at 0.84, WBC down from 10.7 > 4.8, afebrile.  S/p Left calcaneus, talus, and tibia removal of deep implants; irrigation and excisional debridement (Left). Plans are for PICC line today.   Plan: Continue Vancomycin 1500mg  IV every 24 hours Goal AUC 400-550. Expected AUC: 488.5 Continue Cefepime 2g IV q8hr  Monitor clinical status, renal function and culture results daily.  Vancomycin levels at steady state if needed per protocol. F/u OPAT orders needs; likely DC tomorrow  Height: 5\' 7"  (170.2 cm) Weight: 168 lb (76.2 kg) IBW/kg (Calculated) : 66.1  Temp (24hrs), Avg:98.2 F (36.8 C), Min:97.8 F (36.6 C), Max:98.5 F (36.9 C)  Recent Labs  Lab 12/01/18 1029 12/01/18 1836 12/03/18 0535 12/04/18 0426  WBC 6.8 10.7*  --  4.8  CREATININE 0.99 0.89 0.84  --     Estimated Creatinine Clearance: 73.2 mL/min (by C-G formula based on SCr of 0.84 mg/dL).    Allergies  Allergen Reactions  . Lipitor [Atorvastatin] Other (See Comments)    Aching, MS  . Zocor [Simvastatin-High Dose] Other (See Comments)    MS aches  . Celexa [Citalopram Hydrobromide] Other (See Comments)    Nausea,blurred vision  . Crestor [Rosuvastatin]     Pain, aching muscles  . Mobic [Meloxicam] Other (See Comments)    nausea  . Prozac [Fluoxetine Hcl] Other (See Comments)    fatigue    Antimicrobials this admission: Vancomycin 9/10>> Cefepime 9/10>> Cefazolin x1 preop 9/10  Dose adjustments this admission:   Microbiology results: 9/8 Covid: negative 9/10 tissue left ankle: pending   Thank you for the interesting consult and for involving pharmacy in this patient's care.  Tamela Gammon, PharmD 12/04/2018 10:29 AM PGY-2 Pharmacy Administration Resident Direct Phone: [phone] Please check  [email or web address] for unit-specific pharmacist phone numbers

## 2018-12-04 NOTE — Plan of Care (Signed)

## 2018-12-04 NOTE — Plan of Care (Signed)

## 2018-12-05 ENCOUNTER — Encounter (HOSPITAL_COMMUNITY): Payer: Self-pay | Admitting: *Deleted

## 2018-12-05 ENCOUNTER — Inpatient Hospital Stay: Payer: Self-pay

## 2018-12-05 LAB — GLUCOSE, CAPILLARY
Glucose-Capillary: 137 mg/dL — ABNORMAL HIGH (ref 70–99)
Glucose-Capillary: 143 mg/dL — ABNORMAL HIGH (ref 70–99)
Glucose-Capillary: 107 mg/dL — ABNORMAL HIGH (ref 70–99)
Glucose-Capillary: 126 mg/dL — ABNORMAL HIGH (ref 70–99)

## 2018-12-05 MED ORDER — SODIUM CHLORIDE 0.9 % IV SOLN
2.0000 g | INTRAVENOUS | Status: DC
  Administered 2018-12-06 (×2): 2 g via INTRAVENOUS
  Filled 2018-12-05 (×2): qty 20
  Filled 2018-12-05: qty 2

## 2018-12-05 NOTE — Progress Notes (Signed)
Occupational Therapy Treatment Patient Details Name: Jason Woods MRN: [id number] DOB: Jul 26, 1945 Today's Date: 12/05/2018    History of present illness Pt is a 73 y.o. male with L ankle pain after attempted fusion 12/2017, imaging shows infected nonunion, pt now admitted 12/01/18 s/p removal of deep implants of L calcaneus, talus, tibia. PMH includes DM, arthritis, chronic back pain, HTN, legally blind.   OT comments  Pt making good progress towards OT goals this session. Pt MOD I for bed mobility. MIN guard for functional mobility from EOB>sink. Pt maintains NWB status very well with Rw throughout session. Pt supervision for UB/LB bathing while seated at sink. Pt require intermittent MIN A to locate needed ADL items on counter d/t visual impairment- attempted to put shaving cream on head. Close min guard when standing at sink for LB bathing d/t NWB status. Supervision for toilet transfer and MIN A for hygiene during sit >stand. DC plan remains appropriate. Will continue to follow for acute OT needs.   Follow Up Recommendations  No OT follow up    Equipment Recommendations  None recommended by OT    Recommendations for Other Services      Precautions / Restrictions Precautions Precautions: Fall Restrictions Weight Bearing Restrictions: Yes LLE Weight Bearing: Non weight bearing       Mobility Bed Mobility Overal bed mobility: Modified Independent             General bed mobility comments: use of bed rails and elevated HOB  Transfers Overall transfer level: Needs assistance Equipment used: Rolling walker (2 wheeled) Transfers: Sit to/from Stand Sit to Stand: Supervision Stand pivot transfers: Min guard;Supervision       General transfer comment: cues for safety and hand placement    Balance Overall balance assessment: Needs assistance Sitting-balance support: Feet supported Sitting balance-Leahy Scale: Good     Standing balance support: Bilateral upper  extremity supported Standing balance-Leahy Scale: Poor Standing balance comment: Reliant on UE support                           ADL either performed or assessed with clinical judgement   ADL Overall ADL's : Needs assistance/impaired     Grooming: Wash/dry hands;Wash/dry face;Applying deodorant;Set up;Sitting;Brushing hair;Minimal assistance;Supervision/safety Grooming Details (indicate cue type and reason): MIN A for verbal cues  to determine needed tools d/t visual impairment Upper Body Bathing: Set up;Sitting   Lower Body Bathing: Sit to/from stand;Min guard Lower Body Bathing Details (indicate cue type and reason): MIN guard for standing balance at sink Upper Body Dressing : Set up;Sitting;Cueing for sequencing Upper Body Dressing Details (indicate cue type and reason): cues for sequencing donning hospital gown     Toilet Transfer: Ambulation;RW;Min guard Toilet Transfer Details (indicate cue type and reason): assist for equipment management and safety  when ambulating from sink >toilet Toileting- Clothing Manipulation and Hygiene: Minimal assistance;Sit to/from stand Toileting - Clothing Manipulation Details (indicate cue type and reason): required some assistance to manage gown, also to stabalize for balance secondary to NWB LLE     Functional mobility during ADLs: Min guard;Rolling walker;Minimal assistance General ADL Comments: pt required MIN A to determine needed items for groomin d/t visual deficits; MIN guard - MIN A for safety     Vision Baseline Vision/History: Legally blind Patient Visual Report: No change from baseline     Perception     Praxis      Cognition Arousal/Alertness: Awake/alert Behavior During Therapy: [facility] for  tasks assessed/performed Overall Cognitive Status: Within Functional Limits for tasks assessed                                          Exercises     Shoulder Instructions       General Comments       Pertinent Vitals/ Pain       Pain Assessment: No/denies pain  Home Living                                          Prior Functioning/Environment              Frequency  Min 2X/week        Progress Toward Goals  OT Goals(current goals can now be found in the care plan section)  Progress towards OT goals: Progressing toward goals  Acute Rehab OT Goals Time For Goal Achievement: 12/16/18 Potential to Achieve Goals: Good  Plan Discharge plan remains appropriate    Co-evaluation                 AM-PAC OT "6 Clicks" Daily Activity     Outcome Measure   Help from another person eating meals?: None Help from another person taking care of personal grooming?: A Little Help from another person toileting, which includes using toliet, bedpan, or urinal?: A Little Help from another person bathing (including washing, rinsing, drying)?: A Little Help from another person to put on and taking off regular upper body clothing?: A Little Help from another person to put on and taking off regular lower body clothing?: A Little 6 Click Score: 19    End of Session Equipment Utilized During Treatment: Rolling walker      Activity Tolerance Patient tolerated treatment well   Patient Left in chair;with call bell/phone within reach;with chair alarm set   Nurse Communication Mobility status;Other (comment)(RN present giving meds)        Time: [id number] OT Time Calculation (min): 39 min  Charges: OT General Charges $OT Visit: 1 Visit OT Treatments $Self Care/Home Management : 38-52 mins   [place], [place] [phone] [place] 12/05/2018, 10:00 AM

## 2018-12-05 NOTE — Progress Notes (Addendum)
PHARMACY CONSULT NOTE FOR:  OUTPATIENT  PARENTERAL ANTIBIOTIC THERAPY (OPAT)  Indication: Osteomyelitis  Regimen: Ceftriaxone 2g q24 End date: 01/26/2019  Dr. Prince Rome would like to extend to 8 weeks gave advanced homecare notice.  IV antibiotic discharge orders are pended. To discharging provider:  please sign these orders via discharge navigator,  Select New Orders & click on the button choice - Manage This Unsigned Work.     Thank you for allowing pharmacy to be a part of this patient's care.  Phillis Haggis 12/05/2018, 4:49 PM

## 2018-12-05 NOTE — Progress Notes (Signed)
[facility] for Infectious Disease  Date of Admission:  12/01/2018     Total days of antibiotics 5         ASSESSMENT  Mr. 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 Culture results may have been affected by Mr. Dado taking Amoxicillin prior to admission. As there is no growth it is unlikely to be MRSA infection and can narrow down to ceftriaxone for home therapy with recommendation of 6 weeks of therapy for osteomyelitis. Discussed plan of care with Mr. Eppert who indicates his sister and daughter should be able to help with home health medication administration. Discussed with [place] and will see Mr. Eguchi prior to placement of PICC line.  PLAN  1. Continue Cefepime while in the hospital with change to ceftriaxone at home for ease of administration. 2. PICC line pending patient agreement with plan. 3. OPAT orders pending.   Active Problems:   Trimalleolar fracture of ankle, closed, left, with nonunion, subsequent encounter   . ALPRAZolam  1 mg Oral TID  . B-complex with vitamin C  1 tablet Oral Daily  . buprenorphine-naloxone  1 tablet Sublingual Daily  . cholecalciferol  2,000 Units Oral Daily  . docusate sodium  100 mg Oral BID  . dorzolamide  1 drop Both Eyes TID  . DULoxetine  30 mg Oral Daily  . enoxaparin (LOVENOX) injection  40 mg Subcutaneous Q24H  . fluticasone  2 spray Each Nare Daily  . gabapentin  400 mg Oral BID  . hydrochlorothiazide  25 mg Oral Daily  . hydrOXYzine  25 mg Oral QHS  . insulin aspart  0-15 Units Subcutaneous TID WC  . latanoprost  1 drop Right Eye QHS  . losartan  25 mg Oral Daily  . metFORMIN  850 mg Oral BID WC  . metoprolol succinate  25 mg Oral Daily  . multivitamin with minerals  1 tablet Oral Daily  . nicotine  14 mg Transdermal Q24H  . pantoprazole  40 mg Oral Daily  . senna   1 tablet Oral BID  . cyanocobalamin  2,000 mcg Oral Daily    SUBJECTIVE:  Afebrile overnight with no acute concerns. Feeling okay today. Has been dealing with his ankle for the last 18 months and is tired. Sister and daughter are available at home to help with medication administration.   Allergies  Allergen Reactions  . Lipitor [Atorvastatin] Other (See Comments)    Aching, MS  . Zocor [Simvastatin-High Dose] Other (See Comments)    MS aches  . Celexa [Citalopram Hydrobromide] Other (See Comments)    Nausea,blurred vision  . Crestor [Rosuvastatin]     Pain, aching muscles  . Mobic [Meloxicam] Other (See Comments)    nausea  . Prozac [Fluoxetine Hcl] Other (See Comments)    fatigue     Review of Systems: Review of Systems  Constitutional: Negative for chills, fever and weight loss.  Respiratory: Negative for cough, shortness of breath and wheezing.   Cardiovascular: Negative for chest pain and leg swelling.  Gastrointestinal: Negative for abdominal pain, constipation, diarrhea, nausea and vomiting.  Skin: Negative for rash.      OBJECTIVE: Vitals:   12/04/18 0731 12/04/18 1758 12/04/18 1930 12/05/18 0316  BP: 128/76 127/61 123/66 119/62  Pulse: 83 73 68 65  Resp: 15 17 17 17   Temp: 98.5 F (36.9 C) 97.7  F (36.5 C) (!) 97.5 F (36.4 C) 97.6 F (36.4 C)  TempSrc: Oral Oral Oral Oral  SpO2: 97% 100% 100% 97%  Weight:      Height:       Body mass index is 26.31 kg/m.  Physical Exam Constitutional:      General: He is not in acute distress.    Appearance: He is well-developed.     Comments: Seated in the chair; hard of hearing; pleasant.   Cardiovascular:     Rate and Rhythm: Normal rate and regular rhythm.     Heart sounds: Normal heart sounds.  Pulmonary:     Effort: Pulmonary effort is normal.     Breath sounds: Normal breath sounds.  Musculoskeletal:     Comments: Surgical splint on left lower extremity is clean and dry.  Skin:    General: Skin is  warm and dry.  Neurological:     Mental Status: He is alert.  Psychiatric:        Mood and Affect: Mood normal.     Lab Results Lab Results  Component Value Date   WBC 4.8 12/04/2018   HGB 8.4 (L) 12/04/2018   HCT 27.8 (L) 12/04/2018   MCV 76.2 (L) 12/04/2018   PLT 276 12/04/2018    Lab Results  Component Value Date   CREATININE 0.84 12/03/2018   BUN 8 12/03/2018   NA 128 (L) 12/03/2018   K 3.9 12/03/2018   CL 93 (L) 12/03/2018   CO2 25 12/03/2018    Lab Results  Component Value Date   ALT 6 01/11/2018   AST 17 01/11/2018   ALKPHOS 91 01/11/2018   BILITOT 0.5 01/11/2018     Microbiology: Recent Results (from the past 240 hour(s))  SARS CORONAVIRUS 2 (TAT 6-24 HRS) Nasopharyngeal Nasopharyngeal Swab     Status: None   Collection Time: 11/29/18  8:39 AM   Specimen: Nasopharyngeal Swab  Result Value Ref Range Status   SARS Coronavirus 2 NEGATIVE NEGATIVE Final    Comment: (NOTE) SARS-CoV-2 target nucleic acids are NOT DETECTED. The SARS-CoV-2 RNA is generally detectable in upper and lower respiratory specimens during the acute phase of infection. Negative results do not preclude SARS-CoV-2 infection, do not rule out co-infections with other pathogens, and should not be used as the sole basis for treatment or other patient management decisions. Negative results must be combined with clinical observations, patient history, and epidemiological information. The expected result is Negative. Fact Sheet for Patients: SugarRoll.be Fact Sheet for Healthcare Providers: https://www.woods-mathews.com/ This test is not yet approved or cleared by the [place] FDA and  has been authorized for detection and/or diagnosis of SARS-CoV-2 by FDA under an Emergency Use Authorization (EUA). This EUA will remain  in effect (meaning this test can be used) for the duration of the COVID-19 declaration under Section 56 4(b)(1) of the Act, 21  U.S.C. section 360bbb-3(b)(1), unless the authorization is terminated or revoked sooner. Performed at [facility], [place] [address] [place], [place] [address]   Aerobic/Anaerobic Culture (surgical/deep wound)     Status: None (Preliminary result)   Collection Time: 12/01/18  1:50 PM   Specimen: Soft Tissue, Other  Result Value Ref Range Status   Specimen Description TISSUE LEFT ANKLE  Final   Special Requests NONE  Final   Gram Stain   Final    FEW WBC PRESENT,BOTH PMN AND MONONUCLEAR NO ORGANISMS SEEN    Culture   Final    NO GROWTH 3  DAYS NO ANAEROBES ISOLATED; CULTURE IN PROGRESS FOR 5 DAYS Performed at [facility], [place] [address], [place], [place] [address]    Report Status PENDING  Incomplete  Culture, blood (Routine X 2) w Reflex to ID Panel     Status: None (Preliminary result)   Collection Time: 12/01/18  4:33 PM   Specimen: BLOOD RIGHT ARM  Result Value Ref Range Status   Specimen Description BLOOD RIGHT ARM  Final   Special Requests   Final    BOTTLES DRAWN AEROBIC ONLY Blood Culture adequate volume   Culture   Final    NO GROWTH 3 DAYS Performed at [facility], [place] [address] [place], [place] [address]    Report Status PENDING  Incomplete  Culture, blood (Routine X 2) w Reflex to ID Panel     Status: None (Preliminary result)   Collection Time: 12/01/18  4:33 PM   Specimen: BLOOD RIGHT ARM  Result Value Ref Range Status   Specimen Description BLOOD RIGHT ARM  Final   Special Requests   Final    BOTTLES DRAWN AEROBIC ONLY Blood Culture adequate volume   Culture   Final    NO GROWTH 3 DAYS Performed at [facility], [place] [address] [place], [place] [address]    Report Status PENDING  Incomplete     Terri Piedra, [place] for [facility] [phone] Pager  12/05/2018  12:45 PM

## 2018-12-06 LAB — AEROBIC/ANAEROBIC CULTURE W GRAM STAIN (SURGICAL/DEEP WOUND): Culture: NO GROWTH

## 2018-12-06 LAB — CULTURE, BLOOD (ROUTINE X 2)
Culture: NO GROWTH
Culture: NO GROWTH
Special Requests: ADEQUATE
Special Requests: ADEQUATE

## 2018-12-06 LAB — GLUCOSE, CAPILLARY
Glucose-Capillary: 136 mg/dL — ABNORMAL HIGH (ref 70–99)
Glucose-Capillary: 128 mg/dL — ABNORMAL HIGH (ref 70–99)

## 2018-12-06 MED ORDER — CEFTRIAXONE IV (FOR PTA / DISCHARGE USE ONLY): 2.0000 g | INTRAVENOUS | 0 refills | Status: AC

## 2018-12-06 MED ORDER — CEFTRIAXONE IV (FOR PTA / DISCHARGE USE ONLY): 2.0000 g | INTRAVENOUS | 0 refills | Status: DC

## 2018-12-06 MED ORDER — DOCUSATE SODIUM 100 MG PO CAPS: 100.0000 mg | ORAL_CAPSULE | Freq: Two times a day (BID) | ORAL | 0 refills | Status: AC

## 2018-12-06 MED ORDER — OXYCODONE HCL 5 MG PO TABS: 5.0000 mg | ORAL_TABLET | ORAL | 0 refills | Status: DC | PRN

## 2018-12-06 MED ORDER — ASPIRIN EC 81 MG PO TBEC: 81.0000 mg | DELAYED_RELEASE_TABLET | Freq: Two times a day (BID) | ORAL | 0 refills | Status: DC

## 2018-12-06 MED ORDER — CHLORHEXIDINE GLUCONATE CLOTH 2 % EX PADS
6.0000 | MEDICATED_PAD | Freq: Every day | CUTANEOUS | Status: DC
  Administered 2018-12-06: 6 via TOPICAL

## 2018-12-06 MED ORDER — SODIUM CHLORIDE 0.9% FLUSH: 10.0000 mL | INTRAVENOUS | Status: DC | PRN

## 2018-12-06 MED ORDER — SENNA 8.6 MG PO TABS: 2.0000 | ORAL_TABLET | Freq: Two times a day (BID) | ORAL | 0 refills | Status: DC

## 2018-12-06 MED ORDER — HEPARIN SOD (PORK) LOCK FLUSH 100 UNIT/ML IV SOLN
250.0000 [IU] | INTRAVENOUS | Status: DC | PRN
  Filled 2018-12-06: qty 2.5

## 2018-12-06 NOTE — Progress Notes (Signed)
    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 Lipitor [Atorvastatin] Other (See Comments)    Aching, MS  . Zocor [Simvastatin-High Dose] Other (See Comments)    MS aches  . Celexa [Citalopram Hydrobromide] Other (See Comments)    Nausea,blurred vision  . Crestor [Rosuvastatin]     Pain, aching muscles  . Mobic [Meloxicam] Other (See Comments)    nausea  . Prozac [Fluoxetine Hcl] Other (See Comments)    fatigue    OPAT Orders Discharge antibiotics: Ceftriaxone Per pharmacy protocol   Duration: 6 weeks  End Date: 01/12/2019  [facility] Care Per Protocol:  Labs weekly while on IV antibiotics: _X_ CBC with differential _X_ BMP __ CMP _X_ CRP _X_ ESR __ Vancomycin trough __ CK  _X_ Please pull PIC at completion of IV antibiotics __ Please leave PIC in place until doctor has seen patient or been notified  Fax weekly labs to [phone]  Clinic Follow Up Appt:  12/28/18 at 10:30am with Dr. Prince Rome  Active Problems:   Trimalleolar fracture of ankle, closed, left, with nonunion, subsequent encounter  Terri Piedra, [place] for [place] Pager  12/06/2018  10:42 AM

## 2018-12-06 NOTE — Plan of Care (Signed)

## 2018-12-06 NOTE — Progress Notes (Signed)
Peripherally Inserted Central Catheter/Midline Placement  The IV Nurse has discussed with the patient and/or persons authorized to consent for the patient, the purpose of this procedure and the potential benefits and risks involved with this procedure.  The benefits include less needle sticks, lab draws from the catheter, and the patient may be discharged home with the catheter. Risks include, but not limited to, infection, bleeding, blood clot (thrombus formation), and puncture of an artery; nerve damage and irregular heartbeat and possibility to perform a PICC exchange if needed/ordered by physician.  Alternatives to this procedure were also discussed.  Bard Power PICC patient education guide, fact sheet on infection prevention and patient information card has been provided to patient /or left at bedside.    PICC/Midline Placement Documentation  PICC Single Lumen 12/06/18 PICC Right Brachial 38 cm 0 cm (Active)  Indication for Insertion or Continuance of Line Home intravenous therapies (PICC only) 12/06/18 1121  Exposed Catheter (cm) 0 cm 12/06/18 1121  Site Assessment Clean;Dry;Intact 12/06/18 1121  Line Status Flushed;Saline locked;Blood return noted 12/06/18 1121  Dressing Type Transparent;Securing device 12/06/18 1121  Dressing Status Clean;Dry;Intact;Antimicrobial disc in place 12/06/18 1121  Dressing Intervention New dressing 12/06/18 1121  Dressing Change Due 12/13/18 12/06/18 [place], Deyvi Bonanno 12/06/2018, 11:22 AM

## 2018-12-06 NOTE — Progress Notes (Signed)
RN attempted to contact Pt's dtr Otila Kluver, Ph# in chart is not accurate. RN removed contact

## 2018-12-06 NOTE — Progress Notes (Signed)
Gave report to Rod Holler, Therapist, sports, all questions and concerns addressed. Pt with belongings transferred to [facility]

## 2018-12-06 NOTE — Progress Notes (Signed)
Physical Therapy Treatment Patient Details Name: Jason Woods MRN: [id number] DOB: Dec 09, 1945 Today's Date: 12/06/2018    History of Present Illness Pt is a 73 y.o. male with L ankle pain after attempted fusion 12/2017, imaging shows infected nonunion, pt now admitted 12/01/18 s/p removal of deep implants of L calcaneus, talus, tibia. PMH includes DM, arthritis, chronic back pain, HTN, legally blind.    PT Comments    Pt performed gt training and functional mobility with supervision to min guard assistance.  Gt much improved with no LOB.  Pt required cues for safety and post gt training he performed exercises seated at edge of bed.  Pt is slow and guarded but functioning well.  He is awaiting PICC line placement before d/c home.     Follow Up Recommendations  Supervision - Intermittent;Home health PT     Equipment Recommendations  None recommended by PT    Recommendations for Other Services       Precautions / Restrictions Precautions Precautions: Fall Restrictions Weight Bearing Restrictions: Yes LLE Weight Bearing: Non weight bearing    Mobility  Bed Mobility Overal bed mobility: Modified Independent                Transfers Overall transfer level: Needs assistance Equipment used: Rolling walker (2 wheeled) Transfers: Sit to/from Stand Sit to Stand: Supervision         General transfer comment: cues for safety and hand placement  Ambulation/Gait Ambulation/Gait assistance: Min guard Gait Distance (Feet): 30 Feet Assistive device: Rolling walker (2 wheeled) Gait Pattern/deviations: Step-to pattern;Trunk flexed(hop to pattern)     General Gait Details: Cues for uppper trunk control.   Pt continues to follow weight bearing restriction well.  NO LOB noted this session with good balance and safety with turns and backing.   Stairs             Wheelchair Mobility    Modified Rankin (Stroke Patients Only)       Balance     Sitting  balance-Leahy Scale: Good       Standing balance-Leahy Scale: Fair Standing balance comment: Reliant on UE support                            Cognition Arousal/Alertness: Awake/alert Behavior During Therapy: Flat affect Overall Cognitive Status: Within Functional Limits for tasks assessed                                        Exercises General Exercises - Lower Extremity Long Arc Quad: AROM;Left;Seated;10 reps Hip Flexion/Marching: AROM;Left;10 reps;Seated    General Comments        Pertinent Vitals/Pain Pain Assessment: No/denies pain    Home Living                      Prior Function            PT Goals (current goals can now be found in the care plan section) Acute Rehab PT Goals Patient Stated Goal: not stated Potential to Achieve Goals: Good Progress towards PT goals: Progressing toward goals    Frequency    Min 5X/week      PT Plan Current plan remains appropriate    Co-evaluation              AM-PAC PT "6 Clicks" Mobility  Outcome Measure  Help needed turning from your back to your side while in a flat bed without using bedrails?: None Help needed moving from lying on your back to sitting on the side of a flat bed without using bedrails?: None Help needed moving to and from a bed to a chair (including a wheelchair)?: None Help needed standing up from a chair using your arms (e.g., wheelchair or bedside chair)?: None Help needed to walk in hospital room?: A Little Help needed climbing 3-5 steps with a railing? : A Little 6 Click Score: 22    End of Session Equipment Utilized During Treatment: Gait belt Activity Tolerance: Patient tolerated treatment well Patient left: in bed;with call bell/phone within reach(awaiting picc line placement) Nurse Communication: Mobility status PT Visit Diagnosis: Other abnormalities of gait and mobility (R26.89)     Time: [id number] PT Time Calculation (min) (ACUTE  ONLY): 8 min  Charges:  $Gait Training: 8-22 mins                     Jason Woods, PTA Acute Rehabilitation Services Pager [phone] Office [phone]     Jason Woods 12/06/2018, 12:32 PM

## 2018-12-06 NOTE — Discharge Summary (Signed)
Physician Discharge Summary  Patient ID: Jason Woods MRN: [id number] DOB/AGE: 74-Jan-1947 74 y.o.  Admit date: 12/01/2018 Discharge date: 12/06/2018  Admission Diagnoses: Nonunion of L ankle trimalleolar fx; hx of L ankle fusion; hyperlipidemia, HTN, GERD, diabetes, vit B deficiency, hx of pneumonia  Discharge Diagnoses:  Active Problems:   Trimalleolar fracture of ankle, closed, left, with nonunion, subsequent encounter Same as above   Discharged Condition: stable  Hospital Course: Patient presented to [facility] on 12/01/18 for elective L ankle removal of deep implants and I&D of tibia, talus, and calcaneus by Dr. Wylene Simmer.  Intra-op cultures were obtained.  The patient tolerated the procedure well and was admitted to the hospital.  ID was consulted.  The patient was placed on IV vanc and cefepime.  Cultures demonstrated no growth 5 days post-op.  A PICC line was placed.  The patient is to be D/C'd home on 12/06/18 on IV rocephine x 8 wks per ID recommendation.  The patient worked well with therapy.  He tolerated his stay well without complication.  Consults: ID  Significant Diagnostic Studies: microbiology: Intra-op culture  Treatments: IV hydration, antibiotics: Ancef, vancomycin, ceftriaxone and cefepime, analgesia: acetaminophen, Dilaudid and suboxone, and oxycodone, cardiac meds: metoprolol and losartan, anticoagulation: lovenox, insulin: Humalog, procedures: PICC line and surgery: as stated above  Discharge Exam: Blood pressure 133/65, pulse 76, temperature 98.1 F (36.7 C), temperature source Oral, resp. rate 16, height _0  (1.702 m), weight 76.2 kg, SpO2 100 %. General: WDWN patient in NAD. Psych:  Appropriate mood and affect. Neuro:  A&O x 3, Moving all extremities, sensation intact to light touch however subjectively diminished due to DM PN. HEENT:  EOMs intact Chest:  Even non-labored respirations Skin:  SLS C/D/I, no rashes or lesions Extremities: warm/dry, no  visible edema, erythema or echymosis.  No lymphadenopathy. Pulses: Popliteus 2+ MSK:  ROM: EHL/FHL intact, MMT: able to perform quad set   Disposition: Discharge disposition: 01-Home or Self Care       Discharge Instructions    Call MD / Call 911   Complete by: As directed    If you experience chest pain or shortness of breath, CALL 911 and be transported to the hospital emergency room.  If you develope a fever above 101 F, pus (white drainage) or increased drainage or redness at the wound, or calf pain, call your surgeon's office.   Constipation Prevention   Complete by: As directed    Drink plenty of fluids.  Prune juice may be helpful.  You may use a stool softener, such as Colace (over the counter) 100 mg twice a day.  Use MiraLax (over the counter) for constipation as needed.   Diet - low sodium heart healthy   Complete by: As directed    Home infusion instructions [facility] May follow [place] Dosing Protocol; May administer Cathflo as needed to maintain patency of vascular access device.; Flushing of vascular access device: per [facility] Protocol: 0.9% NaCl pre/post medica...   Complete by: As directed    Instructions: May follow [place] Dosing Protocol   Instructions: May administer Cathflo as needed to maintain patency of vascular access device.   Instructions: Flushing of vascular access device: per [facility] Protocol: 0.9% NaCl pre/post medication administration and prn patency; Heparin 100 u/ml, 69m for implanted ports and Heparin 10u/ml, 562mfor all other central venous catheters.   Instructions: May follow [facility] Anaphylaxis Protocol for First Dose Administration in the home: 0.9% NaCl at 25-50  ml/hr to maintain IV access for protocol meds. Epinephrine 0.3 ml IV/IM PRN and Benadryl 25-50 IV/IM PRN s/s of anaphylaxis.   Instructions: [facility] Infusion Coordinator (RN) to assist per patient IV care needs in the home PRN.   Increase activity slowly as tolerated    Complete by: As directed    Non weight bearing   Complete by: As directed    Laterality: left   Extremity: Lower     Allergies as of 12/06/2018      Reactions   Lipitor [atorvastatin] Other (See Comments)   Aching, MS   Zocor [simvastatin-high Dose] Other (See Comments)   MS aches   Celexa [citalopram Hydrobromide] Other (See Comments)   Nausea,blurred vision   Crestor [rosuvastatin]    Pain, aching muscles   Mobic [meloxicam] Other (See Comments)   nausea   Prozac [fluoxetine Hcl] Other (See Comments)   fatigue      Medication List    STOP taking these medications   amoxicillin 500 MG capsule Commonly known as: AMOXIL     TAKE these medications   ALPRAZolam 1 MG tablet Commonly known as: XANAX Take 1 mg by mouth 3 (three) times daily.   aspirin EC 81 MG tablet Take 1 tablet (81 mg total) by mouth 2 (two) times daily.   b complex vitamins tablet Take 1 tablet by mouth daily. With C   bimatoprost 0.01 % Soln Commonly known as: LUMIGAN Place 1 drop into the right eye at bedtime.   buprenorphine 8 MG Subl SL tablet Commonly known as: SUBUTEX Place 8 mg under the tongue 2 (two) times daily.   cefTRIAXone  IVPB Commonly known as: ROCEPHIN Inject 2 g into the vein daily. Indication:  Osteomyelitis  Last Day of Therapy:  11/12/2018 Labs - Once weekly:  CBC/D and BMP, Labs - Every other week:  ESR and CRP   cyanocobalamin 2000 MCG tablet Take 2,000 mcg by mouth daily.   D 2000 50 MCG (2000 UT) Tabs Generic drug: Cholecalciferol Take 2,000 Units by mouth daily.   docusate sodium 100 MG capsule Commonly known as: Colace Take 1 capsule (100 mg total) by mouth 2 (two) times daily. While taking narcotic pain medicine.   dorzolamide 2 % ophthalmic solution Commonly known as: TRUSOPT Place 1 drop into both eyes 3 (three) times daily.   DULoxetine 30 MG capsule Commonly known as: CYMBALTA Take 30 mg by mouth daily.   fluticasone 50 MCG/ACT nasal  spray Commonly known as: FLONASE Place 2 sprays into both nostrils daily.   gabapentin 400 MG capsule Commonly known as: NEURONTIN Take 400 mg by mouth 2 (two) times daily.   hydrochlorothiazide 25 MG tablet Commonly known as: HYDRODIURIL Take 25 mg by mouth daily.   hydrOXYzine 25 MG tablet Commonly known as: ATARAX/VISTARIL Take 25 mg by mouth at bedtime.   insulin glargine 100 UNIT/ML injection Commonly known as: LANTUS Inject 0.1 mLs (10 Units total) into the skin at bedtime. What changed:   how much to take  when to take this  additional instructions   losartan 25 MG tablet Commonly known as: COZAAR Take 1 tablet (25 mg total) by mouth daily. What changed: how much to take   metFORMIN 850 MG tablet Commonly known as: GLUCOPHAGE Take 850 mg by mouth 2 (two) times daily with a meal.   metoprolol succinate 25 MG 24 hr tablet Commonly known as: TOPROL-XL Take 25 mg by mouth daily.   multivitamin with minerals Tabs tablet  Take 1 tablet by mouth daily.   oxyCODONE 5 MG immediate release tablet Commonly known as: Roxicodone Take 1 tablet (5 mg total) by mouth every 4 (four) hours as needed for moderate pain or severe pain.   pantoprazole 40 MG tablet Commonly known as: PROTONIX Take 40 mg by mouth daily.   promethazine 25 MG tablet Commonly known as: PHENERGAN Take 25 mg by mouth 3 (three) times daily as needed.   senna 8.6 MG Tabs tablet Commonly known as: SENOKOT Take 2 tablets (17.2 mg total) by mouth 2 (two) times daily.   triamcinolone cream 0.1 % Commonly known as: KENALOG Apply 1 application topically daily as needed (affected area(s) on skin).            Home Infusion Instuctions  (From admission, onward)         Start     Ordered   12/06/18 0000  Home infusion instructions [facility] May follow [facility] Dosing Protocol; May administer Cathflo as needed to maintain patency of vascular access device.; Flushing of vascular  access device: per [facility] Protocol: 0.9% NaCl pre/post medica...    Question Answer Comment  Instructions May follow [place] Dosing Protocol   Instructions May administer Cathflo as needed to maintain patency of vascular access device.   Instructions Flushing of vascular access device: per [facility] Protocol: 0.9% NaCl pre/post medication administration and prn patency; Heparin 100 u/ml, 14m for implanted ports and Heparin 10u/ml, 560mfor all other central venous catheters.   Instructions May follow [facility] Anaphylaxis Protocol for First Dose Administration in the home: 0.9% NaCl at 25-50 ml/hr to maintain IV access for protocol meds. Epinephrine 0.3 ml IV/IM PRN and Benadryl 25-50 IV/IM PRN s/s of anaphylaxis.   Instructions [facility] Infusion Coordinator (RN) to assist per patient IV care needs in the home PRN.      12/06/18 0727           Discharge Care Instructions  (From admission, onward)         Start     Ordered   12/06/18 0000  Non weight bearing    Question Answer Comment  Laterality left   Extremity Lower      12/06/18 0727         Follow-up Information    [facility] Follow up.   Why: Home Infusion       [facility]   Specialty: Home Health Services Why: Home Health Registered Nurse, Physical and Occupational Therapy, aide and a social worker Contact information: [phone]        HeWylene SimmerMD. Schedule an appointment as soon as possible for a visit in 2 week(s).   Specialty: Orthopedic Surgery Contact information: [phone]         Signed: JuMohammed Kindleffice:  [phone]

## 2018-12-06 NOTE — Progress Notes (Signed)
PHARMACY CONSULT NOTE FOR:  OUTPATIENT  PARENTERAL ANTIBIOTIC THERAPY (OPAT)  Indication: Osteomyelitis  Regimen: Ceftriaxone 2g q24 End date: 01/12/2019  IV antibiotic discharge orders are pended. To discharging provider:  please sign these orders via discharge navigator,  Select New Orders & click on the button choice - Manage This Unsigned Work.     Thank you for allowing pharmacy to be a part of this patient's care.  Jimmy Footman, PharmD, BCPS, BCIDP Infectious Diseases Clinical Pharmacist Phone: [phone] 12/06/2018, 9:09 AM

## 2018-12-06 NOTE — Discharge Instructions (Signed)
Jason Simmer, MD [facility]  Please read the following information regarding your care after surgery.  Medications  You only need a prescription for the narcotic pain medicine (ex. oxycodone, Percocet, Norco).  All of the other medicines listed below are available over the counter. X Aleve 2 pills twice a day for the first 3 days after surgery. X acetominophen (Tylenol) 650 mg every 4-6 hours as you need for minor to moderate pain X oxycodone as prescribed for severe pain X resume suboxone as prescribed by pain management.   Narcotic pain medicine (ex. oxycodone, Percocet, Vicodin) will cause constipation.  To prevent this problem, take the following medicines while you are taking any pain medicine. X docusate sodium (Colace) 100 mg twice a day X senna (Senokot) 2 tablets twice a day  X To help prevent blood clots, take a baby aspirin (81 mg) twice a day after surgery.  You should also get up every hour while you are awake to move around.    Weight Bearing X Do not bear any weight on the operated leg or foot.  Cast / Splint / Dressing X Keep your splint, cast or dressing clean and dry.  Dont put anything (coat hanger, pencil, etc) down inside of it.  If it gets damp, use a hair dryer on the cool setting to dry it.  If it gets soaked, call the office to schedule an appointment for a cast change.    After your dressing, cast or splint is removed; you may shower, but do not soak or scrub the wound.  Allow the water to run over it, and then gently pat it dry.  Swelling It is normal for you to have swelling where you had surgery.  To reduce swelling and pain, keep your toes above your nose for at least 3 days after surgery.  It may be necessary to keep your foot or leg elevated for several weeks.  If it hurts, it should be elevated.  Follow Up Call my office at [phone] when you are discharged from the hospital or surgery center to schedule an appointment to be seen two weeks after  surgery.  Call my office at [phone] if you develop a fever >101.5 F, nausea, vomiting, bleeding from the surgical site or severe pain.

## 2018-12-06 NOTE — TOC Transition Note (Signed)
Transition of Care [facility]) - CM/SW Discharge Note   Patient Details  Name: CHAUNCEY AHLGREN MRN: [id number] Date of Birth: 06-26-1945  Transition of Care [facility]) CM/SW Contact:  Midge Minium RN, BSN, NCM-BC, ACM-RN [phone] Phone Number: 12/06/2018, 9:46 AM   Clinical Narrative:    Patient is medically stable to transition home today with home IV ABT and [place] for [facility] services. CM updated Tommi Rumps RN [facility] liaison) and Carolynn Sayers RN ([facility] infusion liaison) of discharge. Patients daughter will provide transportation home.    Final next level of care: [employer] Barriers to Discharge: No Barriers Identified   Patient Goals and CMS Choice Patient states their goals for this hospitalization and ongoing recovery are:: "to get back home" CMS Medicare.gov Compare Post Acute Care list provided to:: Patient Choice offered to / list presented to : Patient  Discharge Placement                       Discharge Plan and Services In-house Referral: NA Discharge Planning Services: CM Consult Post Acute Care Choice: Durable Medical Equipment, Home Health          DME Arranged: IV pump/equipment(IV ABT) DME Agency: Other - Comment([facility]) Date DME Agency Contacted: 12/02/18 Time DME Agency Contacted: [phone] Representative spoke with at DME Agency: Carolynn Sayers (liaison) [place] Arranged: RN, Disease Management, PT, OT, Nurse's Aide, Refused SNF, Social Work [employer] Agency: [place] Date [place]: 12/02/18 Time [place]: 1455 Representative spoke with at [place]: Adela Lank (liaison)  Social Determinants of Health ([place]) Interventions     Readmission Risk Interventions No flowsheet data found.

## 2018-12-07 DIAGNOSIS — E119 Type 2 diabetes mellitus without complications: Secondary | ICD-10-CM | POA: Diagnosis not present

## 2018-12-07 DIAGNOSIS — R1310 Dysphagia, unspecified: Secondary | ICD-10-CM | POA: Diagnosis not present

## 2018-12-07 DIAGNOSIS — E538 Deficiency of other specified B group vitamins: Secondary | ICD-10-CM | POA: Diagnosis not present

## 2018-12-07 DIAGNOSIS — I10 Essential (primary) hypertension: Secondary | ICD-10-CM | POA: Diagnosis not present

## 2018-12-07 DIAGNOSIS — H539 Unspecified visual disturbance: Secondary | ICD-10-CM | POA: Diagnosis not present

## 2018-12-07 DIAGNOSIS — H548 Legal blindness, as defined in USA: Secondary | ICD-10-CM | POA: Diagnosis not present

## 2018-12-07 DIAGNOSIS — M545 Low back pain: Secondary | ICD-10-CM | POA: Diagnosis not present

## 2018-12-07 DIAGNOSIS — T8149XA Infection following a procedure, other surgical site, initial encounter: Secondary | ICD-10-CM | POA: Diagnosis not present

## 2018-12-07 DIAGNOSIS — F17210 Nicotine dependence, cigarettes, uncomplicated: Secondary | ICD-10-CM | POA: Diagnosis not present

## 2018-12-07 DIAGNOSIS — E7800 Pure hypercholesterolemia, unspecified: Secondary | ICD-10-CM | POA: Diagnosis not present

## 2018-12-07 DIAGNOSIS — K219 Gastro-esophageal reflux disease without esophagitis: Secondary | ICD-10-CM | POA: Diagnosis not present

## 2018-12-07 DIAGNOSIS — F419 Anxiety disorder, unspecified: Secondary | ICD-10-CM | POA: Diagnosis not present

## 2018-12-07 DIAGNOSIS — M19011 Primary osteoarthritis, right shoulder: Secondary | ICD-10-CM | POA: Diagnosis not present

## 2018-12-07 DIAGNOSIS — M47819 Spondylosis without myelopathy or radiculopathy, site unspecified: Secondary | ICD-10-CM | POA: Diagnosis not present

## 2018-12-07 DIAGNOSIS — Z7982 Long term (current) use of aspirin: Secondary | ICD-10-CM | POA: Diagnosis not present

## 2018-12-07 DIAGNOSIS — Z794 Long term (current) use of insulin: Secondary | ICD-10-CM | POA: Diagnosis not present

## 2018-12-07 DIAGNOSIS — M25473 Effusion, unspecified ankle: Secondary | ICD-10-CM | POA: Diagnosis not present

## 2018-12-07 DIAGNOSIS — F329 Major depressive disorder, single episode, unspecified: Secondary | ICD-10-CM | POA: Diagnosis not present

## 2018-12-07 DIAGNOSIS — E785 Hyperlipidemia, unspecified: Secondary | ICD-10-CM | POA: Diagnosis not present

## 2018-12-07 DIAGNOSIS — Z452 Encounter for adjustment and management of vascular access device: Secondary | ICD-10-CM | POA: Diagnosis not present

## 2018-12-07 DIAGNOSIS — S82852K Displaced trimalleolar fracture of left lower leg, subsequent encounter for closed fracture with nonunion: Secondary | ICD-10-CM | POA: Diagnosis not present

## 2018-12-07 DIAGNOSIS — H9190 Unspecified hearing loss, unspecified ear: Secondary | ICD-10-CM | POA: Diagnosis not present

## 2018-12-07 DIAGNOSIS — M19012 Primary osteoarthritis, left shoulder: Secondary | ICD-10-CM | POA: Diagnosis not present

## 2018-12-07 DIAGNOSIS — M160 Bilateral primary osteoarthritis of hip: Secondary | ICD-10-CM | POA: Diagnosis not present

## 2018-12-07 DIAGNOSIS — Z792 Long term (current) use of antibiotics: Secondary | ICD-10-CM | POA: Diagnosis not present

## 2018-12-08 DIAGNOSIS — Z452 Encounter for adjustment and management of vascular access device: Secondary | ICD-10-CM | POA: Diagnosis not present

## 2018-12-08 DIAGNOSIS — S82852K Displaced trimalleolar fracture of left lower leg, subsequent encounter for closed fracture with nonunion: Secondary | ICD-10-CM | POA: Diagnosis not present

## 2018-12-08 DIAGNOSIS — E119 Type 2 diabetes mellitus without complications: Secondary | ICD-10-CM | POA: Diagnosis not present

## 2018-12-08 DIAGNOSIS — I10 Essential (primary) hypertension: Secondary | ICD-10-CM | POA: Diagnosis not present

## 2018-12-08 DIAGNOSIS — T8149XA Infection following a procedure, other surgical site, initial encounter: Secondary | ICD-10-CM | POA: Diagnosis not present

## 2018-12-08 DIAGNOSIS — H539 Unspecified visual disturbance: Secondary | ICD-10-CM | POA: Diagnosis not present

## 2018-12-12 DIAGNOSIS — M25559 Pain in unspecified hip: Secondary | ICD-10-CM | POA: Diagnosis not present

## 2018-12-12 DIAGNOSIS — Z452 Encounter for adjustment and management of vascular access device: Secondary | ICD-10-CM | POA: Diagnosis not present

## 2018-12-12 DIAGNOSIS — E119 Type 2 diabetes mellitus without complications: Secondary | ICD-10-CM | POA: Diagnosis not present

## 2018-12-12 DIAGNOSIS — H539 Unspecified visual disturbance: Secondary | ICD-10-CM | POA: Diagnosis not present

## 2018-12-12 DIAGNOSIS — M79662 Pain in left lower leg: Secondary | ICD-10-CM | POA: Diagnosis not present

## 2018-12-12 DIAGNOSIS — M25532 Pain in left wrist: Secondary | ICD-10-CM | POA: Diagnosis not present

## 2018-12-12 DIAGNOSIS — I10 Essential (primary) hypertension: Secondary | ICD-10-CM | POA: Diagnosis not present

## 2018-12-12 DIAGNOSIS — T8149XA Infection following a procedure, other surgical site, initial encounter: Secondary | ICD-10-CM | POA: Diagnosis not present

## 2018-12-12 DIAGNOSIS — M5010 Cervical disc disorder with radiculopathy, unspecified cervical region: Secondary | ICD-10-CM | POA: Diagnosis not present

## 2018-12-12 DIAGNOSIS — S82852K Displaced trimalleolar fracture of left lower leg, subsequent encounter for closed fracture with nonunion: Secondary | ICD-10-CM | POA: Diagnosis not present

## 2018-12-15 DIAGNOSIS — E1165 Type 2 diabetes mellitus with hyperglycemia: Secondary | ICD-10-CM | POA: Diagnosis not present

## 2018-12-15 DIAGNOSIS — M25572 Pain in left ankle and joints of left foot: Secondary | ICD-10-CM | POA: Diagnosis not present

## 2018-12-15 DIAGNOSIS — E11319 Type 2 diabetes mellitus with unspecified diabetic retinopathy without macular edema: Secondary | ICD-10-CM | POA: Diagnosis not present

## 2018-12-15 DIAGNOSIS — Z299 Encounter for prophylactic measures, unspecified: Secondary | ICD-10-CM | POA: Diagnosis not present

## 2018-12-15 DIAGNOSIS — F17210 Nicotine dependence, cigarettes, uncomplicated: Secondary | ICD-10-CM | POA: Diagnosis not present

## 2018-12-15 DIAGNOSIS — I10 Essential (primary) hypertension: Secondary | ICD-10-CM | POA: Diagnosis not present

## 2018-12-15 DIAGNOSIS — Z6826 Body mass index (BMI) 26.0-26.9, adult: Secondary | ICD-10-CM | POA: Diagnosis not present

## 2018-12-19 DIAGNOSIS — Z452 Encounter for adjustment and management of vascular access device: Secondary | ICD-10-CM | POA: Diagnosis not present

## 2018-12-19 DIAGNOSIS — E119 Type 2 diabetes mellitus without complications: Secondary | ICD-10-CM | POA: Diagnosis not present

## 2018-12-19 DIAGNOSIS — I10 Essential (primary) hypertension: Secondary | ICD-10-CM | POA: Diagnosis not present

## 2018-12-19 DIAGNOSIS — H539 Unspecified visual disturbance: Secondary | ICD-10-CM | POA: Diagnosis not present

## 2018-12-19 DIAGNOSIS — S82852K Displaced trimalleolar fracture of left lower leg, subsequent encounter for closed fracture with nonunion: Secondary | ICD-10-CM | POA: Diagnosis not present

## 2018-12-19 DIAGNOSIS — M19072 Primary osteoarthritis, left ankle and foot: Secondary | ICD-10-CM | POA: Diagnosis not present

## 2018-12-19 DIAGNOSIS — M869 Osteomyelitis, unspecified: Secondary | ICD-10-CM | POA: Diagnosis not present

## 2018-12-19 DIAGNOSIS — T8149XA Infection following a procedure, other surgical site, initial encounter: Secondary | ICD-10-CM | POA: Diagnosis not present

## 2018-12-19 DIAGNOSIS — Z5189 Encounter for other specified aftercare: Secondary | ICD-10-CM | POA: Diagnosis not present

## 2018-12-19 DIAGNOSIS — M25572 Pain in left ankle and joints of left foot: Secondary | ICD-10-CM | POA: Diagnosis not present

## 2018-12-19 DIAGNOSIS — M868X7 Other osteomyelitis, ankle and foot: Secondary | ICD-10-CM | POA: Diagnosis not present

## 2018-12-20 DIAGNOSIS — I10 Essential (primary) hypertension: Secondary | ICD-10-CM | POA: Diagnosis not present

## 2018-12-20 DIAGNOSIS — S82852K Displaced trimalleolar fracture of left lower leg, subsequent encounter for closed fracture with nonunion: Secondary | ICD-10-CM | POA: Diagnosis not present

## 2018-12-20 DIAGNOSIS — T8149XA Infection following a procedure, other surgical site, initial encounter: Secondary | ICD-10-CM | POA: Diagnosis not present

## 2018-12-20 DIAGNOSIS — H539 Unspecified visual disturbance: Secondary | ICD-10-CM | POA: Diagnosis not present

## 2018-12-20 DIAGNOSIS — E119 Type 2 diabetes mellitus without complications: Secondary | ICD-10-CM | POA: Diagnosis not present

## 2018-12-20 DIAGNOSIS — Z452 Encounter for adjustment and management of vascular access device: Secondary | ICD-10-CM | POA: Diagnosis not present

## 2018-12-26 DIAGNOSIS — Z452 Encounter for adjustment and management of vascular access device: Secondary | ICD-10-CM | POA: Diagnosis not present

## 2018-12-26 DIAGNOSIS — I10 Essential (primary) hypertension: Secondary | ICD-10-CM | POA: Diagnosis not present

## 2018-12-26 DIAGNOSIS — T8149XA Infection following a procedure, other surgical site, initial encounter: Secondary | ICD-10-CM | POA: Diagnosis not present

## 2018-12-26 DIAGNOSIS — S82852K Displaced trimalleolar fracture of left lower leg, subsequent encounter for closed fracture with nonunion: Secondary | ICD-10-CM | POA: Diagnosis not present

## 2018-12-26 DIAGNOSIS — E119 Type 2 diabetes mellitus without complications: Secondary | ICD-10-CM | POA: Diagnosis not present

## 2018-12-26 DIAGNOSIS — H539 Unspecified visual disturbance: Secondary | ICD-10-CM | POA: Diagnosis not present

## 2018-12-27 DIAGNOSIS — E7800 Pure hypercholesterolemia, unspecified: Secondary | ICD-10-CM | POA: Diagnosis not present

## 2018-12-27 DIAGNOSIS — E119 Type 2 diabetes mellitus without complications: Secondary | ICD-10-CM | POA: Diagnosis not present

## 2018-12-27 DIAGNOSIS — I10 Essential (primary) hypertension: Secondary | ICD-10-CM | POA: Diagnosis not present

## 2018-12-27 DIAGNOSIS — L570 Actinic keratosis: Secondary | ICD-10-CM | POA: Diagnosis not present

## 2018-12-28 ENCOUNTER — Inpatient Hospital Stay: Payer: Medicare Other | Admitting: Infectious Diseases

## 2018-12-29 ENCOUNTER — Telehealth: Payer: Self-pay

## 2018-12-29 NOTE — Telephone Encounter (Signed)
COVID-19 Pre-Screening Questions:12/29/18  Do you currently have a fever (>100 F), chills or unexplained body aches? NO  Are you currently experiencing new cough, shortness of breath, sore throat, runny nose?NO .  Have you recently travelled outside the state of [place] in the last 14 days? NO .  Have you been in contact with someone that is currently pending confirmation of Covid19 testing or has been confirmed to have the Covid19 virus?  NO  **If the patient answers NO to ALL questions -  advise the patient to please call the clinic before coming to the office should any symptoms develop.     

## 2018-12-30 ENCOUNTER — Ambulatory Visit (INDEPENDENT_AMBULATORY_CARE_PROVIDER_SITE_OTHER): Payer: Medicare Other | Admitting: Infectious Diseases

## 2018-12-30 ENCOUNTER — Other Ambulatory Visit: Payer: Self-pay

## 2018-12-30 ENCOUNTER — Encounter: Payer: Self-pay | Admitting: Infectious Diseases

## 2018-12-30 VITALS — BP 134/64 | HR 69 | Temp 97.5°F

## 2018-12-30 DIAGNOSIS — M86172 Other acute osteomyelitis, left ankle and foot: Secondary | ICD-10-CM | POA: Diagnosis not present

## 2018-12-30 DIAGNOSIS — M868X7 Other osteomyelitis, ankle and foot: Secondary | ICD-10-CM

## 2018-12-30 DIAGNOSIS — E871 Hypo-osmolality and hyponatremia: Secondary | ICD-10-CM

## 2018-12-30 DIAGNOSIS — Z23 Encounter for immunization: Secondary | ICD-10-CM

## 2018-12-30 NOTE — Patient Instructions (Signed)
Continue rocephin daily until 01/27/2019 (orders given to your [facility] and infusion co. to extend duration of ABX). Return to clinic in 4 weeks for appointment with Dr. Prince Rome. Call Dr. Woody Seller re: aspirin instructions prior to upcoming dental procedure.

## 2018-12-30 NOTE — Progress Notes (Signed)
Subjective:    Patient ID: Jason Woods, male    DOB: Aug 13, 1945, 73 y.o.   MRN: [id number]  HPI The patient is a 73 year old white male diabetic with a history of ankle fusion presenting for hospital follow-up for left calcaneal osteomyelitis.  He was recently hospitalized at [facility] for increasing pain and swelling to his left ankle and had hardware removed on December 01, 2018.  Prior to his surgery, he had been off oral antibiotics for many months but he took for amoxicillin pills the day before admission for oral pain related to a possible tooth abscess.  As a result, his operative cultures were negative so antibiotics were ultimately transitioned to Rocephin monotherapy.  He has continue take his antibiotics daily around 6 PM without missing doses via his PICC line.  His sister is with him at today's visit in our clinic.  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  He has been instructed to continue no weightbearing to his foot by his orthopedist.  His next appointment with his orthopedist is on January 20, 2019.  The patient's labs were reviewed at today's visit.  Specifically, his white blood cell count was 5900, hemoglobin was 8.3, platelet count was 357,000, his creatinine was 0.84, and his CRP was within normal limits at 7.  He has no new complaints at today's visit, but he does have multiple questions regarding aspirin use for an upcoming dental procedure.   Past Medical History:  Diagnosis Date  . Anxiety    "about all my life" (01/07/2018)  . Arthritis    "shoulders, back, hips" (01/07/2018)  . CAP (community acquired pneumonia) 04/2017  . Chronic back pain    "upper and lower" (01/07/2018)  . 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 Depression   . GERD (gastroesophageal reflux disease)   . High cholesterol   . History of kidney stones    . Hypertension   . Legally blind   . Skin cancer    "frozen off my face, arms" (01/07/2018)  . Type II diabetes mellitus ([place])   . Vitamin B deficiency 03/09/2017    Past Surgical History:  Procedure Laterality Date  . ANKLE FUSION Left 01/12/2018   Procedure: LEFT ANKLE REMOVAL OF EXTERNAL FIXATOR, ANKLE FUSION WITH PH[place]EN[place] NAIL;  Surgeon: [name], MD;  Location: [place];  Service: Orthopedics;  Laterality: Left;  . ANKLE HARDWARE REMOVAL Left 01/07/2018   REMOVAL OF HARDWARE, TAKE DOWN OF NONUNION, INSERTION OF ANTIBIOTC BEADS/notes 01/07/2018  . BIOPSY  04/23/2017   Procedure: BIOPSY;  Surgeon: Rogene Houston, MD;  [facility];  Service: Endoscopy;;  gastric  . CATARACT EXTRACTION W/ INTRAOCULAR LENS  IMPLANT, BILATERAL Bilateral   . ESOPHAGEAL DILATION N/A 04/23/2017   Procedure: ESOPHAGEAL DILATION;  Surgeon: Rogene Houston, MD;  [facility];  Service: Endoscopy;  Laterality: N/A;  . ESOPHAGOGASTRODUODENOSCOPY N/A 04/23/2017   Procedure: ESOPHAGOGASTRODUODENOSCOPY (EGD);  Surgeon: Rogene Houston, MD;  [facility];  Service: Endoscopy;  Laterality: N/A;  9:55-moved to 1040 per Lelon Frohlich  . EXTERNAL FIXATION LEG Left 01/07/2018   EXTERNAL FIXATION LEG, TIBIA TO CALCANEUS/notes 01/07/2018  . EXTERNAL FIXATION LEG Left 01/07/2018   Procedure: EXTERNAL FIXATION LEG, TIBIA TO CALCANEUS;  Surgeon: [name], MD;  Location: [place];  Service: Orthopedics;  Laterality: Left;  .  EXTERNAL FIXATION REMOVAL Left 01/12/2018   Procedure: REMOVAL EXTERNAL FIXATION ANKLE;  Surgeon: [name], MD;  Location: [place];  Service: Orthopedics;  Laterality: Left;  . FRACTURE SURGERY    . HARDWARE REMOVAL Left 01/07/2018   Procedure: LEFT ANKLE REMOVAL OF HARDWARE, TAKE DOWN OF NONUNION, INSERTION OF ANTIBIOTC BEADS;  Surgeon: [name], MD;  Location: [place];  Service: Orthopedics;  Laterality: Left;  . HARDWARE REMOVAL Left 12/01/2018   Procedure: Left  calcaneus, talus, and tibia removal of deep implants; irrigation and excisional debridement;  Surgeon: Wylene Simmer, MD;  Location: [place];  Service: Orthopedics;  Laterality: Left;  . I&D EXTREMITY Left 10/05/2017   Procedure: IRRIGATION AND DEBRIDEMENT QPEN ANKLE FRACTURE;  Surgeon: [name], MD;  Location: [place];  Service: Orthopedics;  Laterality: Left;  . INNER EAR SURGERY Right 1962   "scraped the bone; couldn't hear out of it; was infected"  . MULTIPLE TOOTH EXTRACTIONS  2018  . ORIF ANKLE FRACTURE Left 10/05/2017   Procedure: OPEN REDUCTION INTERNAL FIXATION TRIMALLEOLAR (ORIF)ANKLE FRACTURE;  Surgeon: [name], MD;  Location: [place];  Service: Orthopedics;  Laterality: Left;  . WRIST FRACTURE SURGERY Left 2018     Family History  Problem Relation Age of Onset  . Diabetes Mother   . Heart attack Father   . Diabetes Sister   . Diabetes Brother      Social History   Tobacco Use  . Smoking status: Current Every Day Smoker    Packs/day: 1.00    Years: 62.00    Pack years: 62.00    Types: Cigarettes  . Smokeless tobacco: Never Used  Substance Use Topics  . Alcohol use: Not Currently    Frequency: Never    Comment:  "nothing since the 1990s"  . Drug use: Not Currently    Comment:   "nothing since 1980s"      reports previously being sexually active.   Outpatient Medications Prior to Visit  Medication Sig Dispense Refill  . ALPRAZolam (XANAX) 1 MG tablet Take 1 mg by mouth 3 (three) times daily.     [name] aspirin EC 81 MG tablet Take 1 tablet (81 mg total) by mouth 2 (two) times daily. 84 tablet 0  . b complex vitamins tablet Take 1 tablet by mouth daily. With C    . bimatoprost (LUMIGAN) 0.01 % SOLN Place 1 drop into the right eye at bedtime.    . buprenorphine (SUBUTEX) 8 MG SUBL SL tablet Place 8 mg under the tongue 2 (two) times daily.   1  . cefTRIAXone (ROCEPHIN) IVPB Inject 2 g into the vein daily. Indication:  Osteomyelitis  Last Day of Therapy:  01/26/2019  Labs - Once weekly:  CBC/D and BMP, Labs - Every other week:  ESR and CRP 51 Units 0  . Cholecalciferol (D 2000) 2000 units TABS Take 2,000 Units by mouth daily.    . cyanocobalamin 2000 MCG tablet Take 2,000 mcg by mouth daily.    [name] docusate sodium (COLACE) 100 MG capsule Take 1 capsule (100 mg total) by mouth 2 (two) times daily. While taking narcotic pain medicine. 30 capsule 0  . dorzolamide (TRUSOPT) 2 % ophthalmic solution Place 1 drop into both eyes 3 (three) times daily.   3  . DULoxetine (CYMBALTA) 30 MG capsule Take 30 mg by mouth daily.   90  . fluticasone (FLONASE) 50 MCG/ACT nasal spray Place 2 sprays into both nostrils daily.     [name]  gabapentin (NEURONTIN) 400 MG capsule Take 400 mg by mouth 2 (two) times daily.     . hydrochlorothiazide (HYDRODIURIL) 25 MG tablet Take 25 mg by mouth daily.  4  . hydrOXYzine (ATARAX/VISTARIL) 25 MG tablet Take 25 mg by mouth at bedtime.    . insulin glargine (LANTUS) 100 UNIT/ML injection Inject 0.1 mLs (10 Units total) into the skin at bedtime. (Patient taking differently: Inject 10-18 Units into the skin See admin instructions. Per sliding  16 units in the morning and 8-10 units in the evening) 10 mL 1  . losartan (COZAAR) 25 MG tablet Take 1 tablet (25 mg total) by mouth daily. 30 tablet 1  . metFORMIN (GLUCOPHAGE) 850 MG tablet Take 850 mg by mouth 2 (two) times daily with a meal.    . metoprolol succinate (TOPROL-XL) 25 MG 24 hr tablet Take 25 mg by mouth daily.    . Multiple Vitamin (MULTIVITAMIN WITH MINERALS) TABS tablet Take 1 tablet by mouth daily.    . pantoprazole (PROTONIX) 40 MG tablet Take 40 mg by mouth daily.    . promethazine (PHENERGAN) 25 MG tablet Take 25 mg by mouth 3 (three) times daily as needed.    . senna (SENOKOT) 8.6 MG TABS tablet Take 2 tablets (17.2 mg total) by mouth 2 (two) times daily. 30 tablet 0  . triamcinolone cream (KENALOG) 0.1 % Apply 1 application topically daily as needed (affected area(s) on skin).   5  .  HYDROcodone-acetaminophen (NORCO/VICODIN) 5-325 MG tablet hydrocodone 5 mg-acetaminophen 325 mg tablet  Take 1 tablet every 4 hours by oral route as needed.    [name] oxyCODONE (ROXICODONE) 5 MG immediate release tablet Take 1 tablet (5 mg total) by mouth every 4 (four) hours as needed for moderate pain or severe pain. (Patient not taking: Reported on 12/30/2018) 30 tablet 0   No facility-administered medications prior to visit.      Allergies  Allergen Reactions  . Lipitor [Atorvastatin] Other (See Comments)    Aching, MS  . Zocor [Simvastatin-High Dose] Other (See Comments)    MS aches  . Celexa [Citalopram Hydrobromide] Other (See Comments)    Nausea,blurred vision  . Crestor [Rosuvastatin]     Pain, aching muscles  . Mobic [Meloxicam] Other (See Comments)    nausea  . Prozac [Fluoxetine Hcl] Other (See Comments)    fatigue      Review of Systems  Constitutional: Positive for fatigue. Negative for chills and fever.  HENT: Negative for congestion, hearing loss, rhinorrhea and sinus pressure.   Eyes: Negative for photophobia, pain, redness and visual disturbance.  Respiratory: Negative for apnea, cough, shortness of breath and wheezing.   Cardiovascular: Negative for chest pain and palpitations.  Gastrointestinal: Negative for abdominal pain, constipation, diarrhea, nausea and vomiting.  Endocrine: Negative for cold intolerance, heat intolerance, polydipsia and polyuria.  Genitourinary: Negative for decreased urine volume, dysuria, frequency, hematuria and testicular pain.  Musculoskeletal: Positive for arthralgias. Negative for back pain, myalgias and neck pain.  Skin: Positive for wound. Negative for pallor and rash.  Allergic/Immunologic: Negative for immunocompromised state.  Neurological: Negative for dizziness, seizures, syncope, speech difficulty and light-headedness.  Hematological: Does not bruise/bleed easily.  Psychiatric/Behavioral: Negative for agitation and  hallucinations. The patient is not nervous/anxious.        Objective:     Vitals:   12/30/18 0849  BP: 134/64  Pulse: 69  Temp: (!) 97.5 F (36.4 C)  SpO2: 99%     Physical Exam Gen: chronically  ill-appearing, NAD, A&Ox 3 Head: NCAT, no temporal wasting evident EENT: PERRL, EOMI, MMM, adequate dentition Neck: supple, no JVD CV: NRRR, no murmurs evident Pulm: CTA bilaterally, no wheeze or retractions Abd: soft, NTND, +BS Extrems:  trace LE edema, 2+ pulses MSK: hard short cast intact to LT foot/ankle (red) Skin: no rashes, adequate skin turgor Neuro: CN II-XII grossly intact, no focal neurologic deficits appreciated, gait was not assessed as pt was in a wheelchair at today's visit, A&Ox 3   Labs: Lab Results  Component Value Date   WBC 4.8 12/04/2018   HGB 8.4 (L) 12/04/2018   HCT 27.8 (L) 12/04/2018   MCV 76.2 (L) 12/04/2018   PLT 276 12/04/2018   Lab Results  Component Value Date   NA 128 (L) 12/03/2018   K 3.9 12/03/2018   CL 93 (L) 12/03/2018   CO2 25 12/03/2018   GLUCOSE 123 (H) 12/03/2018   BUN 8 12/03/2018   CREATININE 0.84 12/03/2018   CALCIUM 8.3 (L) 12/03/2018   Lab Results  Component Value Date   CRP 1.8 (H) 12/02/2018          Assessment & Plan:  The patient is a 73 year old white male diabetic with a history of left ankle fusion presenting for left calcaneal osteomyelitis status post hardware removal.  1.  Left calcaneal osteomyelitis -following his surgery on December 01, 2018, his fusion hardware has now been removed.  Operative cultures were negative at the time of his recent debridement, but he did receive oral amoxicillin the evening before his debridement for a "tooth ache" which likely led to this result.  His antibiotics were successfully tapered from vancomycin and cefepime to Rocephin daily.  His initial antibiotic end date was scheduled for October 22 but given this type of location I would advise that we change his stop date till  January 27, 2019.  This will allow for the patient's wound to hopefully be visualized as his hard cast is scheduled for removal or exchange on October 30.  His inflammatory markers and white blood cell count have responded appropriately and no oral antibiotics should be needed once he completes IV antibiotic treatment.  Unfortunately, the location of his osteomyelitis is notorious for being difficult to heal and difficult to respond to even IV antibiotics given the rather avascular nature of the calcaneal bone.  Should he fail this approach, he will likely need further debridement versus consideration of amputation of his foot.  2.  Dental issues- patient has multiple questions regarding his aspirin for an upcoming dental procedure.  I have instructed the patient to reach out to his primary care physician Dr. Woody Seller for advice regarding holding his aspirin or continuation.  From an ID standpoint, he actually has a low risk for complications of his dental procedure if done while he remains on IV antibiotics.

## 2019-01-02 DIAGNOSIS — T8149XA Infection following a procedure, other surgical site, initial encounter: Secondary | ICD-10-CM | POA: Diagnosis not present

## 2019-01-02 DIAGNOSIS — S82852K Displaced trimalleolar fracture of left lower leg, subsequent encounter for closed fracture with nonunion: Secondary | ICD-10-CM | POA: Diagnosis not present

## 2019-01-02 DIAGNOSIS — I10 Essential (primary) hypertension: Secondary | ICD-10-CM | POA: Diagnosis not present

## 2019-01-02 DIAGNOSIS — Z452 Encounter for adjustment and management of vascular access device: Secondary | ICD-10-CM | POA: Diagnosis not present

## 2019-01-02 DIAGNOSIS — E119 Type 2 diabetes mellitus without complications: Secondary | ICD-10-CM | POA: Diagnosis not present

## 2019-01-02 DIAGNOSIS — H539 Unspecified visual disturbance: Secondary | ICD-10-CM | POA: Diagnosis not present

## 2019-01-06 DIAGNOSIS — Z794 Long term (current) use of insulin: Secondary | ICD-10-CM | POA: Diagnosis not present

## 2019-01-06 DIAGNOSIS — M19012 Primary osteoarthritis, left shoulder: Secondary | ICD-10-CM | POA: Diagnosis not present

## 2019-01-06 DIAGNOSIS — H548 Legal blindness, as defined in USA: Secondary | ICD-10-CM | POA: Diagnosis not present

## 2019-01-06 DIAGNOSIS — Z452 Encounter for adjustment and management of vascular access device: Secondary | ICD-10-CM | POA: Diagnosis not present

## 2019-01-06 DIAGNOSIS — Z792 Long term (current) use of antibiotics: Secondary | ICD-10-CM | POA: Diagnosis not present

## 2019-01-06 DIAGNOSIS — Z7982 Long term (current) use of aspirin: Secondary | ICD-10-CM | POA: Diagnosis not present

## 2019-01-06 DIAGNOSIS — E785 Hyperlipidemia, unspecified: Secondary | ICD-10-CM | POA: Diagnosis not present

## 2019-01-06 DIAGNOSIS — E119 Type 2 diabetes mellitus without complications: Secondary | ICD-10-CM | POA: Diagnosis not present

## 2019-01-06 DIAGNOSIS — M160 Bilateral primary osteoarthritis of hip: Secondary | ICD-10-CM | POA: Diagnosis not present

## 2019-01-06 DIAGNOSIS — H9190 Unspecified hearing loss, unspecified ear: Secondary | ICD-10-CM | POA: Diagnosis not present

## 2019-01-06 DIAGNOSIS — T8149XA Infection following a procedure, other surgical site, initial encounter: Secondary | ICD-10-CM | POA: Diagnosis not present

## 2019-01-06 DIAGNOSIS — E538 Deficiency of other specified B group vitamins: Secondary | ICD-10-CM | POA: Diagnosis not present

## 2019-01-06 DIAGNOSIS — F419 Anxiety disorder, unspecified: Secondary | ICD-10-CM | POA: Diagnosis not present

## 2019-01-06 DIAGNOSIS — F329 Major depressive disorder, single episode, unspecified: Secondary | ICD-10-CM | POA: Diagnosis not present

## 2019-01-06 DIAGNOSIS — M545 Low back pain: Secondary | ICD-10-CM | POA: Diagnosis not present

## 2019-01-06 DIAGNOSIS — R1310 Dysphagia, unspecified: Secondary | ICD-10-CM | POA: Diagnosis not present

## 2019-01-06 DIAGNOSIS — E7800 Pure hypercholesterolemia, unspecified: Secondary | ICD-10-CM | POA: Diagnosis not present

## 2019-01-06 DIAGNOSIS — M19011 Primary osteoarthritis, right shoulder: Secondary | ICD-10-CM | POA: Diagnosis not present

## 2019-01-06 DIAGNOSIS — S82852K Displaced trimalleolar fracture of left lower leg, subsequent encounter for closed fracture with nonunion: Secondary | ICD-10-CM | POA: Diagnosis not present

## 2019-01-06 DIAGNOSIS — M25473 Effusion, unspecified ankle: Secondary | ICD-10-CM | POA: Diagnosis not present

## 2019-01-06 DIAGNOSIS — F17210 Nicotine dependence, cigarettes, uncomplicated: Secondary | ICD-10-CM | POA: Diagnosis not present

## 2019-01-06 DIAGNOSIS — H539 Unspecified visual disturbance: Secondary | ICD-10-CM | POA: Diagnosis not present

## 2019-01-06 DIAGNOSIS — I10 Essential (primary) hypertension: Secondary | ICD-10-CM | POA: Diagnosis not present

## 2019-01-06 DIAGNOSIS — M47819 Spondylosis without myelopathy or radiculopathy, site unspecified: Secondary | ICD-10-CM | POA: Diagnosis not present

## 2019-01-06 DIAGNOSIS — K219 Gastro-esophageal reflux disease without esophagitis: Secondary | ICD-10-CM | POA: Diagnosis not present

## 2019-01-09 DIAGNOSIS — Z452 Encounter for adjustment and management of vascular access device: Secondary | ICD-10-CM | POA: Diagnosis not present

## 2019-01-09 DIAGNOSIS — S82852K Displaced trimalleolar fracture of left lower leg, subsequent encounter for closed fracture with nonunion: Secondary | ICD-10-CM | POA: Diagnosis not present

## 2019-01-09 DIAGNOSIS — E119 Type 2 diabetes mellitus without complications: Secondary | ICD-10-CM | POA: Diagnosis not present

## 2019-01-09 DIAGNOSIS — T8149XA Infection following a procedure, other surgical site, initial encounter: Secondary | ICD-10-CM | POA: Diagnosis not present

## 2019-01-09 DIAGNOSIS — H539 Unspecified visual disturbance: Secondary | ICD-10-CM | POA: Diagnosis not present

## 2019-01-09 DIAGNOSIS — I10 Essential (primary) hypertension: Secondary | ICD-10-CM | POA: Diagnosis not present

## 2019-01-16 DIAGNOSIS — E119 Type 2 diabetes mellitus without complications: Secondary | ICD-10-CM | POA: Diagnosis not present

## 2019-01-16 DIAGNOSIS — H539 Unspecified visual disturbance: Secondary | ICD-10-CM | POA: Diagnosis not present

## 2019-01-16 DIAGNOSIS — I10 Essential (primary) hypertension: Secondary | ICD-10-CM | POA: Diagnosis not present

## 2019-01-16 DIAGNOSIS — S82852K Displaced trimalleolar fracture of left lower leg, subsequent encounter for closed fracture with nonunion: Secondary | ICD-10-CM | POA: Diagnosis not present

## 2019-01-16 DIAGNOSIS — T8149XA Infection following a procedure, other surgical site, initial encounter: Secondary | ICD-10-CM | POA: Diagnosis not present

## 2019-01-16 DIAGNOSIS — Z452 Encounter for adjustment and management of vascular access device: Secondary | ICD-10-CM | POA: Diagnosis not present

## 2019-01-20 DIAGNOSIS — M25572 Pain in left ankle and joints of left foot: Secondary | ICD-10-CM | POA: Diagnosis not present

## 2019-01-23 DIAGNOSIS — S82852K Displaced trimalleolar fracture of left lower leg, subsequent encounter for closed fracture with nonunion: Secondary | ICD-10-CM | POA: Diagnosis not present

## 2019-01-23 DIAGNOSIS — T8149XA Infection following a procedure, other surgical site, initial encounter: Secondary | ICD-10-CM | POA: Diagnosis not present

## 2019-01-23 DIAGNOSIS — I10 Essential (primary) hypertension: Secondary | ICD-10-CM | POA: Diagnosis not present

## 2019-01-23 DIAGNOSIS — E119 Type 2 diabetes mellitus without complications: Secondary | ICD-10-CM | POA: Diagnosis not present

## 2019-01-23 DIAGNOSIS — H539 Unspecified visual disturbance: Secondary | ICD-10-CM | POA: Diagnosis not present

## 2019-01-23 DIAGNOSIS — Z452 Encounter for adjustment and management of vascular access device: Secondary | ICD-10-CM | POA: Diagnosis not present

## 2019-01-30 DIAGNOSIS — E119 Type 2 diabetes mellitus without complications: Secondary | ICD-10-CM | POA: Diagnosis not present

## 2019-01-30 DIAGNOSIS — T8149XA Infection following a procedure, other surgical site, initial encounter: Secondary | ICD-10-CM | POA: Diagnosis not present

## 2019-01-30 DIAGNOSIS — I10 Essential (primary) hypertension: Secondary | ICD-10-CM | POA: Diagnosis not present

## 2019-01-30 DIAGNOSIS — Z452 Encounter for adjustment and management of vascular access device: Secondary | ICD-10-CM | POA: Diagnosis not present

## 2019-01-30 DIAGNOSIS — S82852K Displaced trimalleolar fracture of left lower leg, subsequent encounter for closed fracture with nonunion: Secondary | ICD-10-CM | POA: Diagnosis not present

## 2019-01-30 DIAGNOSIS — H539 Unspecified visual disturbance: Secondary | ICD-10-CM | POA: Diagnosis not present

## 2019-02-02 DIAGNOSIS — S82852K Displaced trimalleolar fracture of left lower leg, subsequent encounter for closed fracture with nonunion: Secondary | ICD-10-CM | POA: Diagnosis not present

## 2019-02-02 DIAGNOSIS — I10 Essential (primary) hypertension: Secondary | ICD-10-CM | POA: Diagnosis not present

## 2019-02-02 DIAGNOSIS — T8149XA Infection following a procedure, other surgical site, initial encounter: Secondary | ICD-10-CM | POA: Diagnosis not present

## 2019-02-02 DIAGNOSIS — Z452 Encounter for adjustment and management of vascular access device: Secondary | ICD-10-CM | POA: Diagnosis not present

## 2019-02-02 DIAGNOSIS — E119 Type 2 diabetes mellitus without complications: Secondary | ICD-10-CM | POA: Diagnosis not present

## 2019-02-02 DIAGNOSIS — H539 Unspecified visual disturbance: Secondary | ICD-10-CM | POA: Diagnosis not present

## 2019-02-03 ENCOUNTER — Telehealth: Payer: Self-pay

## 2019-02-03 ENCOUNTER — Other Ambulatory Visit: Payer: Self-pay

## 2019-02-03 ENCOUNTER — Ambulatory Visit (INDEPENDENT_AMBULATORY_CARE_PROVIDER_SITE_OTHER): Payer: Medicare Other | Admitting: Infectious Diseases

## 2019-02-03 ENCOUNTER — Encounter: Payer: Self-pay | Admitting: Infectious Diseases

## 2019-02-03 VITALS — BP 120/68 | HR 69 | Temp 98.0°F | Wt 170.0 lb

## 2019-02-03 DIAGNOSIS — M86172 Other acute osteomyelitis, left ankle and foot: Secondary | ICD-10-CM

## 2019-02-03 NOTE — Progress Notes (Signed)
Subjective:    Patient ID: Jason Woods, male    DOB: 1945/06/26, 73 y.o.   MRN: [id number]  HPI The patient is a 73 year old white male diabetic with a history of ankle fusion presenting for hospital follow-up for left calcaneal osteomyelitis.  He was recently hospitalized at [facility] for increasing pain and swelling to his left ankle and had hardware removed on December 01, 2018.  Prior to his surgery, he had been off oral antibiotics for many months but he took for amoxicillin pills the day before admission for oral pain related to a possible tooth abscess.  As a result, his operative cultures were negative so antibiotics were ultimately transitioned to Rocephin monotherapy.  His sister is with him at today's visit in our clinic.  He was last seen in our clinic on December 30, 2018.  He completed an 8-week course of Rocephin on January 27, 2019.  His inflammatory markers have continued to improve while on treatment and he is requesting that his PICC line be removed at today's visit.  His hard short cast has now been removed and his wound has completely healed.  The patient's labs were reviewed at today's visit.  His CRP has remained within normal limits to only mildly elevated for the last several weeks. he has no new complaints at today's visit.   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    Depression    GERD (gastroesophageal reflux disease)    High cholesterol    History of kidney stones    Hypertension    Legally blind    Skin cancer    "frozen off my face, arms" (01/07/2018)   Type II diabetes mellitus ([place])    Vitamin B deficiency 03/09/2017    Past Surgical History:  Procedure Laterality Date   ANKLE FUSION  Left 01/12/2018   Procedure: LEFT ANKLE REMOVAL OF EXTERNAL FIXATOR, ANKLE FUSION WITH PHOENIX NAIL;  Surgeon: [name], MD;  Location: [place];  Service: Orthopedics;  Laterality: Left;   ANKLE HARDWARE REMOVAL Left 01/07/2018   REMOVAL OF HARDWARE, TAKE DOWN OF NONUNION, INSERTION OF ANTIBIOTC BEADS/notes 01/07/2018   BIOPSY  04/23/2017   Procedure: BIOPSY;  Surgeon: Rogene Houston, MD;  [facility];  Service: Endoscopy;;  gastric   CATARACT EXTRACTION W/ INTRAOCULAR LENS  IMPLANT, BILATERAL Bilateral    ESOPHAGEAL DILATION N/A 04/23/2017   Procedure: ESOPHAGEAL DILATION;  Surgeon: Rogene Houston, MD;  [facility];  Service: Endoscopy;  Laterality: N/A;   ESOPHAGOGASTRODUODENOSCOPY N/A 04/23/2017   Procedure: ESOPHAGOGASTRODUODENOSCOPY (EGD);  Surgeon: Rogene Houston, MD;  [facility];  Service: Endoscopy;  Laterality: N/A;  9:55-moved to 1040 per Ann   EXTERNAL FIXATION LEG Left 01/07/2018   EXTERNAL FIXATION LEG, TIBIA TO CALCANEUS/notes 01/07/2018   EXTERNAL FIXATION LEG Left 01/07/2018   Procedure: EXTERNAL FIXATION LEG, TIBIA TO CALCANEUS;  Surgeon: [name], MD;  Location: [place];  Service: Orthopedics;  Laterality: Left;   EXTERNAL FIXATION REMOVAL Left 01/12/2018   Procedure: REMOVAL EXTERNAL FIXATION ANKLE;  Surgeon: [name], MD;  Location: [place];  Service: Orthopedics;  Laterality: Left;   FRACTURE SURGERY  HARDWARE REMOVAL Left 01/07/2018   Procedure: LEFT ANKLE REMOVAL OF HARDWARE, TAKE DOWN OF NONUNION, INSERTION OF ANTIBIOTC BEADS;  Surgeon: [name], MD;  Location: [place];  Service: Orthopedics;  Laterality: Left;   HARDWARE REMOVAL Left 12/01/2018   Procedure: Left calcaneus, talus, and tibia removal of deep implants; irrigation and excisional debridement;  Surgeon: Wylene Simmer, MD;  Location: [place];  Service: Orthopedics;  Laterality: Left;   I&D EXTREMITY Left 10/05/2017   Procedure: IRRIGATION AND  DEBRIDEMENT QPEN ANKLE FRACTURE;  Surgeon: [name], MD;  Location: [place];  Service: Orthopedics;  Laterality: Left;   INNER EAR SURGERY Right 1962   "scraped the bone; couldn't hear out of it; was infected"   MULTIPLE TOOTH EXTRACTIONS  2018   ORIF ANKLE FRACTURE Left 10/05/2017   Procedure: OPEN REDUCTION INTERNAL FIXATION TRIMALLEOLAR (ORIF)ANKLE FRACTURE;  Surgeon: [name], MD;  Location: [place];  Service: Orthopedics;  Laterality: Left;   WRIST FRACTURE SURGERY Left 2018     Family History  Problem Relation Age of Onset   Diabetes Mother    Heart attack Father    Diabetes Sister    Diabetes Brother      Social History   Tobacco Use   Smoking status: Current Every Day Smoker    Packs/day: 1.00    Years: 62.00    Pack years: 62.00    Types: Cigarettes   Smokeless tobacco: Never Used  Substance Use Topics   Alcohol use: Not Currently    Frequency: Never    Comment:  "nothing since the 1990s"   Drug use: Not Currently    Comment:   "nothing since 1980s"      reports previously being sexually active.   Outpatient Medications Prior to Visit  Medication Sig Dispense Refill   ALPRAZolam (XANAX) 1 MG tablet Take 1 mg by mouth 3 (three) times daily.      b complex vitamins tablet Take 1 tablet by mouth daily. With C     bimatoprost (LUMIGAN) 0.01 % SOLN Place 1 drop into the right eye at bedtime.     buprenorphine (SUBUTEX) 8 MG SUBL SL tablet Place 8 mg under the tongue 2 (two) times daily.   1   Cholecalciferol (D 2000) 2000 units TABS Take 2,000 Units by mouth daily.     cyanocobalamin 2000 MCG tablet Take 2,000 mcg by mouth daily.     docusate sodium (COLACE) 100 MG capsule Take 1 capsule (100 mg total) by mouth 2 (two) times daily. While taking narcotic pain medicine. 30 capsule 0   dorzolamide (TRUSOPT) 2 % ophthalmic solution Place 1 drop into both eyes 3 (three) times daily.   3   DULoxetine (CYMBALTA) 30 MG capsule Take 30 mg by mouth  daily.   90   fluticasone (FLONASE) 50 MCG/ACT nasal spray Place 2 sprays into both nostrils daily.      gabapentin (NEURONTIN) 400 MG capsule Take 400 mg by mouth 2 (two) times daily.      hydrochlorothiazide (HYDRODIURIL) 25 MG tablet Take 25 mg by mouth daily.  4   HYDROcodone-acetaminophen (NORCO/VICODIN) 5-325 MG tablet hydrocodone 5 mg-acetaminophen 325 mg tablet  Take 1 tablet every 4 hours by oral route as needed.     hydrOXYzine (ATARAX/VISTARIL) 25 MG tablet Take 25 mg by mouth at bedtime.     insulin glargine (LANTUS) 100 UNIT/ML injection Inject 0.1 mLs (10 Units total) into the skin at bedtime. (Patient taking differently: Inject 10-18  Units into the skin See admin instructions. Per sliding  16 units in the morning and 8-10 units in the evening) 10 mL 1   losartan (COZAAR) 25 MG tablet Take 1 tablet (25 mg total) by mouth daily. 30 tablet 1   metFORMIN (GLUCOPHAGE) 850 MG tablet Take 850 mg by mouth 2 (two) times daily with a meal.     metoprolol succinate (TOPROL-XL) 25 MG 24 hr tablet Take 25 mg by mouth daily.     Multiple Vitamin (MULTIVITAMIN WITH MINERALS) TABS tablet Take 1 tablet by mouth daily.     oxyCODONE (ROXICODONE) 5 MG immediate release tablet Take 1 tablet (5 mg total) by mouth every 4 (four) hours as needed for moderate pain or severe pain. 30 tablet 0   pantoprazole (PROTONIX) 40 MG tablet Take 40 mg by mouth daily.     promethazine (PHENERGAN) 25 MG tablet Take 25 mg by mouth 3 (three) times daily as needed.     senna (SENOKOT) 8.6 MG TABS tablet Take 2 tablets (17.2 mg total) by mouth 2 (two) times daily. 30 tablet 0   triamcinolone cream (KENALOG) 0.1 % Apply 1 application topically daily as needed (affected area(s) on skin).   5   aspirin EC 81 MG tablet Take 1 tablet (81 mg total) by mouth 2 (two) times daily. (Patient not taking: Reported on 02/03/2019) 84 tablet 0   No facility-administered medications prior to visit.      Allergies    Allergen Reactions   Lipitor [Atorvastatin] Other (See Comments)    Aching, MS   Zocor [Simvastatin-High Dose] Other (See Comments)    MS aches   Celexa [Citalopram Hydrobromide] Other (See Comments)    Nausea,blurred vision   Crestor [Rosuvastatin]     Pain, aching muscles   Mobic [Meloxicam] Other (See Comments)    nausea   Prozac [Fluoxetine Hcl] Other (See Comments)    fatigue      Review of Systems  Constitutional: Negative for chills, fatigue and fever.  HENT: Negative for congestion, hearing loss, rhinorrhea and sinus pressure.   Eyes: Negative for photophobia, pain, redness and visual disturbance.  Respiratory: Negative for apnea, cough, shortness of breath and wheezing.   Cardiovascular: Negative for chest pain and palpitations.  Gastrointestinal: Negative for abdominal pain, constipation, diarrhea, nausea and vomiting.  Endocrine: Negative for cold intolerance, heat intolerance, polydipsia and polyuria.  Genitourinary: Negative for decreased urine volume, dysuria, frequency, hematuria and testicular pain.  Musculoskeletal: Positive for arthralgias. Negative for back pain, myalgias and neck pain.  Skin: Negative for pallor, rash and wound.  Allergic/Immunologic: Negative for immunocompromised state.  Neurological: Negative for dizziness, seizures, syncope, speech difficulty and light-headedness.  Hematological: Does not bruise/bleed easily.  Psychiatric/Behavioral: Negative for agitation and hallucinations. The patient is not nervous/anxious.        Objective:     Vitals:   02/03/19 0955  BP: 120/68  Pulse: 69  Temp: 98 F (36.7 C)     Physical Exam Gen: pleasant, elderly/frail, NAD, A&Ox 3 Head: NCAT, no temporal wasting evident EENT: PERRL, EOMI, MMM, adequate dentition Neck: supple, no JVD CV: NRRR, no murmurs evident Pulm: CTA bilaterally, mild expiratory wheeze, no retractions Abd: soft, NTND, +BS Extrems: trace LE edema, 1+ pulses MSK:  prior ulceration/wound along his LT Achilles has completely healed without underlying fluctuance Skin: no rashes, poor toenail hygiene, adequate skin turgor Neuro: CN II-XII grossly intact, no focal neurologic deficits appreciated, gait was not assessed, A&Ox 3  Labs: Lab  Results  Component Value Date   WBC 4.8 12/04/2018   HGB 8.4 (L) 12/04/2018   HCT 27.8 (L) 12/04/2018   MCV 76.2 (L) 12/04/2018   PLT 276 12/04/2018   Lab Results  Component Value Date   NA 128 (L) 12/03/2018   K 3.9 12/03/2018   CL 93 (L) 12/03/2018   CO2 25 12/03/2018   GLUCOSE 123 (H) 12/03/2018   BUN 8 12/03/2018   CREATININE 0.84 12/03/2018   CALCIUM 8.3 (L) 12/03/2018   Lab Results  Component Value Date   CRP 1.8 (H) 12/02/2018          Assessment & Plan:  The patient is a 73 year old white male diabetic with a history of left ankle fusion presenting for left calcaneal osteomyelitis status post hardware removal.  1.  Left calcaneal osteomyelitis - The patient's prior fusion hardware was removed on December 01, 2018 at the time of his most recent debridement.  Operative cultures were negative at the time of his recent debridement, but he did receive oral amoxicillin the evening before his debridement for a "tooth ache" which likely led to this result.  He has now completed an 8-week course of IV Rocephin empirically, ending on January 27, 2019.  I personally removed his PICC line today in our office as the patient's wound has completely healed and his inflammatory markers have settled to a baseline of minimal inflammation/normalization.  Patient was instructed to adhere to orthopedic advice regarding weightbearing to his affected extremity.  He does not require further follow-up in our office for this issue at this time.  As he has no remnant hardware, he does not require consolidative oral antibiotics thereafter.

## 2019-02-03 NOTE — Telephone Encounter (Signed)
Called [facility] to inform them patient's picc line was removed today during office visit. Spoke with Stanton Kidney who will inform home health.  [place]

## 2019-02-03 NOTE — Patient Instructions (Signed)
Shower normally now that PICC has been removed. Delay scrubbing on former PICC site directly for 2 days.

## 2019-02-04 DIAGNOSIS — Z452 Encounter for adjustment and management of vascular access device: Secondary | ICD-10-CM | POA: Diagnosis not present

## 2019-02-04 DIAGNOSIS — T8149XA Infection following a procedure, other surgical site, initial encounter: Secondary | ICD-10-CM | POA: Diagnosis not present

## 2019-02-04 DIAGNOSIS — I10 Essential (primary) hypertension: Secondary | ICD-10-CM | POA: Diagnosis not present

## 2019-02-04 DIAGNOSIS — S82852K Displaced trimalleolar fracture of left lower leg, subsequent encounter for closed fracture with nonunion: Secondary | ICD-10-CM | POA: Diagnosis not present

## 2019-02-04 DIAGNOSIS — E119 Type 2 diabetes mellitus without complications: Secondary | ICD-10-CM | POA: Diagnosis not present

## 2019-02-04 DIAGNOSIS — H539 Unspecified visual disturbance: Secondary | ICD-10-CM | POA: Diagnosis not present

## 2019-02-05 DIAGNOSIS — R1310 Dysphagia, unspecified: Secondary | ICD-10-CM | POA: Diagnosis not present

## 2019-02-05 DIAGNOSIS — M25473 Effusion, unspecified ankle: Secondary | ICD-10-CM | POA: Diagnosis not present

## 2019-02-05 DIAGNOSIS — H9190 Unspecified hearing loss, unspecified ear: Secondary | ICD-10-CM | POA: Diagnosis not present

## 2019-02-05 DIAGNOSIS — H548 Legal blindness, as defined in USA: Secondary | ICD-10-CM | POA: Diagnosis not present

## 2019-02-05 DIAGNOSIS — M160 Bilateral primary osteoarthritis of hip: Secondary | ICD-10-CM | POA: Diagnosis not present

## 2019-02-05 DIAGNOSIS — E538 Deficiency of other specified B group vitamins: Secondary | ICD-10-CM | POA: Diagnosis not present

## 2019-02-05 DIAGNOSIS — F17210 Nicotine dependence, cigarettes, uncomplicated: Secondary | ICD-10-CM | POA: Diagnosis not present

## 2019-02-05 DIAGNOSIS — Z794 Long term (current) use of insulin: Secondary | ICD-10-CM | POA: Diagnosis not present

## 2019-02-05 DIAGNOSIS — M19012 Primary osteoarthritis, left shoulder: Secondary | ICD-10-CM | POA: Diagnosis not present

## 2019-02-05 DIAGNOSIS — E7800 Pure hypercholesterolemia, unspecified: Secondary | ICD-10-CM | POA: Diagnosis not present

## 2019-02-05 DIAGNOSIS — Z8701 Personal history of pneumonia (recurrent): Secondary | ICD-10-CM | POA: Diagnosis not present

## 2019-02-05 DIAGNOSIS — H539 Unspecified visual disturbance: Secondary | ICD-10-CM | POA: Diagnosis not present

## 2019-02-05 DIAGNOSIS — M47819 Spondylosis without myelopathy or radiculopathy, site unspecified: Secondary | ICD-10-CM | POA: Diagnosis not present

## 2019-02-05 DIAGNOSIS — F329 Major depressive disorder, single episode, unspecified: Secondary | ICD-10-CM | POA: Diagnosis not present

## 2019-02-05 DIAGNOSIS — E785 Hyperlipidemia, unspecified: Secondary | ICD-10-CM | POA: Diagnosis not present

## 2019-02-05 DIAGNOSIS — Z79891 Long term (current) use of opiate analgesic: Secondary | ICD-10-CM | POA: Diagnosis not present

## 2019-02-05 DIAGNOSIS — F419 Anxiety disorder, unspecified: Secondary | ICD-10-CM | POA: Diagnosis not present

## 2019-02-05 DIAGNOSIS — I10 Essential (primary) hypertension: Secondary | ICD-10-CM | POA: Diagnosis not present

## 2019-02-05 DIAGNOSIS — E11319 Type 2 diabetes mellitus with unspecified diabetic retinopathy without macular edema: Secondary | ICD-10-CM | POA: Diagnosis not present

## 2019-02-05 DIAGNOSIS — M545 Low back pain: Secondary | ICD-10-CM | POA: Diagnosis not present

## 2019-02-05 DIAGNOSIS — Z7982 Long term (current) use of aspirin: Secondary | ICD-10-CM | POA: Diagnosis not present

## 2019-02-05 DIAGNOSIS — K219 Gastro-esophageal reflux disease without esophagitis: Secondary | ICD-10-CM | POA: Diagnosis not present

## 2019-02-05 DIAGNOSIS — M19011 Primary osteoarthritis, right shoulder: Secondary | ICD-10-CM | POA: Diagnosis not present

## 2019-02-05 DIAGNOSIS — Z79899 Other long term (current) drug therapy: Secondary | ICD-10-CM | POA: Diagnosis not present

## 2019-02-05 DIAGNOSIS — S82852K Displaced trimalleolar fracture of left lower leg, subsequent encounter for closed fracture with nonunion: Secondary | ICD-10-CM | POA: Diagnosis not present

## 2019-02-08 DIAGNOSIS — I10 Essential (primary) hypertension: Secondary | ICD-10-CM | POA: Diagnosis not present

## 2019-02-08 DIAGNOSIS — F329 Major depressive disorder, single episode, unspecified: Secondary | ICD-10-CM | POA: Diagnosis not present

## 2019-02-08 DIAGNOSIS — F419 Anxiety disorder, unspecified: Secondary | ICD-10-CM | POA: Diagnosis not present

## 2019-02-08 DIAGNOSIS — M19011 Primary osteoarthritis, right shoulder: Secondary | ICD-10-CM | POA: Diagnosis not present

## 2019-02-08 DIAGNOSIS — S82852K Displaced trimalleolar fracture of left lower leg, subsequent encounter for closed fracture with nonunion: Secondary | ICD-10-CM | POA: Diagnosis not present

## 2019-02-08 DIAGNOSIS — E11319 Type 2 diabetes mellitus with unspecified diabetic retinopathy without macular edema: Secondary | ICD-10-CM | POA: Diagnosis not present

## 2019-02-10 DIAGNOSIS — E7800 Pure hypercholesterolemia, unspecified: Secondary | ICD-10-CM | POA: Diagnosis not present

## 2019-02-10 DIAGNOSIS — Z0000 Encounter for general adult medical examination without abnormal findings: Secondary | ICD-10-CM | POA: Diagnosis not present

## 2019-02-10 DIAGNOSIS — Z1339 Encounter for screening examination for other mental health and behavioral disorders: Secondary | ICD-10-CM | POA: Diagnosis not present

## 2019-02-10 DIAGNOSIS — R5383 Other fatigue: Secondary | ICD-10-CM | POA: Diagnosis not present

## 2019-02-10 DIAGNOSIS — Z6826 Body mass index (BMI) 26.0-26.9, adult: Secondary | ICD-10-CM | POA: Diagnosis not present

## 2019-02-10 DIAGNOSIS — Z299 Encounter for prophylactic measures, unspecified: Secondary | ICD-10-CM | POA: Diagnosis not present

## 2019-02-10 DIAGNOSIS — E1165 Type 2 diabetes mellitus with hyperglycemia: Secondary | ICD-10-CM | POA: Diagnosis not present

## 2019-02-10 DIAGNOSIS — Z125 Encounter for screening for malignant neoplasm of prostate: Secondary | ICD-10-CM | POA: Diagnosis not present

## 2019-02-10 DIAGNOSIS — Z1211 Encounter for screening for malignant neoplasm of colon: Secondary | ICD-10-CM | POA: Diagnosis not present

## 2019-02-10 DIAGNOSIS — E1142 Type 2 diabetes mellitus with diabetic polyneuropathy: Secondary | ICD-10-CM | POA: Diagnosis not present

## 2019-02-10 DIAGNOSIS — Z7189 Other specified counseling: Secondary | ICD-10-CM | POA: Diagnosis not present

## 2019-02-10 DIAGNOSIS — E559 Vitamin D deficiency, unspecified: Secondary | ICD-10-CM | POA: Diagnosis not present

## 2019-02-10 DIAGNOSIS — I10 Essential (primary) hypertension: Secondary | ICD-10-CM | POA: Diagnosis not present

## 2019-02-10 DIAGNOSIS — E538 Deficiency of other specified B group vitamins: Secondary | ICD-10-CM | POA: Diagnosis not present

## 2019-02-10 DIAGNOSIS — Z1331 Encounter for screening for depression: Secondary | ICD-10-CM | POA: Diagnosis not present

## 2019-04-07 DIAGNOSIS — E1142 Type 2 diabetes mellitus with diabetic polyneuropathy: Secondary | ICD-10-CM | POA: Diagnosis not present

## 2019-04-07 DIAGNOSIS — J069 Acute upper respiratory infection, unspecified: Secondary | ICD-10-CM | POA: Diagnosis not present

## 2019-04-07 DIAGNOSIS — E1165 Type 2 diabetes mellitus with hyperglycemia: Secondary | ICD-10-CM | POA: Diagnosis not present

## 2019-04-07 DIAGNOSIS — E11319 Type 2 diabetes mellitus with unspecified diabetic retinopathy without macular edema: Secondary | ICD-10-CM | POA: Diagnosis not present

## 2019-04-07 DIAGNOSIS — F17210 Nicotine dependence, cigarettes, uncomplicated: Secondary | ICD-10-CM | POA: Diagnosis not present

## 2019-04-07 DIAGNOSIS — Z299 Encounter for prophylactic measures, unspecified: Secondary | ICD-10-CM | POA: Diagnosis not present

## 2019-04-07 DIAGNOSIS — E1139 Type 2 diabetes mellitus with other diabetic ophthalmic complication: Secondary | ICD-10-CM | POA: Diagnosis not present

## 2019-04-12 DIAGNOSIS — Z299 Encounter for prophylactic measures, unspecified: Secondary | ICD-10-CM | POA: Diagnosis not present

## 2019-04-12 DIAGNOSIS — Z713 Dietary counseling and surveillance: Secondary | ICD-10-CM | POA: Diagnosis not present

## 2019-04-12 DIAGNOSIS — Z6826 Body mass index (BMI) 26.0-26.9, adult: Secondary | ICD-10-CM | POA: Diagnosis not present

## 2019-04-12 DIAGNOSIS — R05 Cough: Secondary | ICD-10-CM | POA: Diagnosis not present

## 2019-04-12 DIAGNOSIS — J069 Acute upper respiratory infection, unspecified: Secondary | ICD-10-CM | POA: Diagnosis not present

## 2019-04-12 DIAGNOSIS — F17210 Nicotine dependence, cigarettes, uncomplicated: Secondary | ICD-10-CM | POA: Diagnosis not present

## 2019-04-19 DIAGNOSIS — E119 Type 2 diabetes mellitus without complications: Secondary | ICD-10-CM | POA: Diagnosis not present

## 2019-04-19 DIAGNOSIS — I10 Essential (primary) hypertension: Secondary | ICD-10-CM | POA: Diagnosis not present

## 2019-04-19 DIAGNOSIS — E7800 Pure hypercholesterolemia, unspecified: Secondary | ICD-10-CM | POA: Diagnosis not present

## 2019-04-20 ENCOUNTER — Ambulatory Visit: Payer: Medicare HMO | Attending: Internal Medicine

## 2019-04-20 ENCOUNTER — Other Ambulatory Visit: Payer: Self-pay

## 2019-04-20 DIAGNOSIS — Z20822 Contact with and (suspected) exposure to covid-19: Secondary | ICD-10-CM

## 2019-04-20 DIAGNOSIS — U071 COVID-19: Secondary | ICD-10-CM | POA: Diagnosis not present

## 2019-04-21 LAB — NOVEL CORONAVIRUS, NAA: SARS-CoV-2, NAA: DETECTED — AB

## 2019-04-22 ENCOUNTER — Telehealth (HOSPITAL_COMMUNITY): Payer: Self-pay | Admitting: Nurse Practitioner

## 2019-04-22 NOTE — Telephone Encounter (Signed)
Called to discuss with Diona Fanti about Covid symptoms and the use of bamlanivimab, a monoclonal antibody infusion for those with mild to moderate Covid symptoms and at a high risk of hospitalization.     Pt does not qualify for infusion therapy as his symptoms first presented > 10 days prior to timing of infusion. Patient also not able to travel to [facility]. Symptoms tier reviewed as well as criteria for ending isolation. Preventative practices reviewed. Patient verbalized understanding. Patient advised to go to Urgent care or ED with severe symptoms.Lengthy discussion regarding monitoring of oxygen levels at home and symptomatic care. Patient encouraged to discuss with his local pharmacy and/or his PCP.    Patient Active Problem List   Diagnosis Date Noted  . Trimalleolar fracture of ankle, closed, left, with nonunion, subsequent encounter 12/01/2018  . Acute osteomyelitis of left ankle ([place]) 11/2018  . S/P ankle fusion 01/27/2018  . Fracture of left ankle 01/07/2018  . 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 Open left ankle fracture 10/05/2017  . Trimalleolar fracture, left, open type I or II, with nonunion, subsequent encounter 10/05/2017  . PNA (pneumonia) 04/29/2017  . Esophageal dysphagia 03/31/2017  . Abnormal esophagram 03/31/2017  . Diabetes ([place]) 03/09/2017  . Vitamin B deficiency 03/09/2017  . High cholesterol   . Hypertension   . GERD (gastroesophageal reflux disease)     Beckey Rutter, DNP, AGNP-C [facility] [email or web address] [facility]: [id number] ([place])

## 2019-05-05 DIAGNOSIS — I700 Atherosclerosis of aorta: Secondary | ICD-10-CM | POA: Diagnosis not present

## 2019-05-05 DIAGNOSIS — I959 Hypotension, unspecified: Secondary | ICD-10-CM | POA: Diagnosis not present

## 2019-05-05 DIAGNOSIS — E1165 Type 2 diabetes mellitus with hyperglycemia: Secondary | ICD-10-CM | POA: Diagnosis not present

## 2019-05-05 DIAGNOSIS — E1142 Type 2 diabetes mellitus with diabetic polyneuropathy: Secondary | ICD-10-CM | POA: Diagnosis not present

## 2019-05-17 DIAGNOSIS — E119 Type 2 diabetes mellitus without complications: Secondary | ICD-10-CM | POA: Diagnosis not present

## 2019-05-17 DIAGNOSIS — E7800 Pure hypercholesterolemia, unspecified: Secondary | ICD-10-CM | POA: Diagnosis not present

## 2019-05-17 DIAGNOSIS — I10 Essential (primary) hypertension: Secondary | ICD-10-CM | POA: Diagnosis not present

## 2019-05-23 DIAGNOSIS — Z6826 Body mass index (BMI) 26.0-26.9, adult: Secondary | ICD-10-CM | POA: Diagnosis not present

## 2019-05-23 DIAGNOSIS — Z299 Encounter for prophylactic measures, unspecified: Secondary | ICD-10-CM | POA: Diagnosis not present

## 2019-05-23 DIAGNOSIS — I10 Essential (primary) hypertension: Secondary | ICD-10-CM | POA: Diagnosis not present

## 2019-05-23 DIAGNOSIS — E1142 Type 2 diabetes mellitus with diabetic polyneuropathy: Secondary | ICD-10-CM | POA: Diagnosis not present

## 2019-05-23 DIAGNOSIS — E1165 Type 2 diabetes mellitus with hyperglycemia: Secondary | ICD-10-CM | POA: Diagnosis not present

## 2019-05-23 DIAGNOSIS — D649 Anemia, unspecified: Secondary | ICD-10-CM | POA: Diagnosis not present

## 2019-05-24 DIAGNOSIS — E7800 Pure hypercholesterolemia, unspecified: Secondary | ICD-10-CM | POA: Diagnosis not present

## 2019-05-24 DIAGNOSIS — E119 Type 2 diabetes mellitus without complications: Secondary | ICD-10-CM | POA: Diagnosis not present

## 2019-05-24 DIAGNOSIS — I10 Essential (primary) hypertension: Secondary | ICD-10-CM | POA: Diagnosis not present

## 2019-06-01 DIAGNOSIS — Z79891 Long term (current) use of opiate analgesic: Secondary | ICD-10-CM | POA: Diagnosis not present

## 2019-06-01 DIAGNOSIS — M25559 Pain in unspecified hip: Secondary | ICD-10-CM | POA: Diagnosis not present

## 2019-06-01 DIAGNOSIS — M545 Low back pain: Secondary | ICD-10-CM | POA: Diagnosis not present

## 2019-06-01 DIAGNOSIS — M79662 Pain in left lower leg: Secondary | ICD-10-CM | POA: Diagnosis not present

## 2019-06-01 DIAGNOSIS — F419 Anxiety disorder, unspecified: Secondary | ICD-10-CM | POA: Diagnosis not present

## 2019-06-01 DIAGNOSIS — M130 Polyarthritis, unspecified: Secondary | ICD-10-CM | POA: Diagnosis not present

## 2019-06-01 DIAGNOSIS — M5010 Cervical disc disorder with radiculopathy, unspecified cervical region: Secondary | ICD-10-CM | POA: Diagnosis not present

## 2019-06-01 DIAGNOSIS — M25532 Pain in left wrist: Secondary | ICD-10-CM | POA: Diagnosis not present

## 2019-06-19 DIAGNOSIS — L570 Actinic keratosis: Secondary | ICD-10-CM | POA: Diagnosis not present

## 2019-06-20 DIAGNOSIS — E1165 Type 2 diabetes mellitus with hyperglycemia: Secondary | ICD-10-CM | POA: Diagnosis not present

## 2019-07-10 DIAGNOSIS — I10 Essential (primary) hypertension: Secondary | ICD-10-CM | POA: Diagnosis not present

## 2019-07-10 DIAGNOSIS — E7800 Pure hypercholesterolemia, unspecified: Secondary | ICD-10-CM | POA: Diagnosis not present

## 2019-07-10 DIAGNOSIS — E119 Type 2 diabetes mellitus without complications: Secondary | ICD-10-CM | POA: Diagnosis not present

## 2019-08-09 DIAGNOSIS — J069 Acute upper respiratory infection, unspecified: Secondary | ICD-10-CM | POA: Diagnosis not present

## 2019-08-20 DIAGNOSIS — I10 Essential (primary) hypertension: Secondary | ICD-10-CM | POA: Diagnosis not present

## 2019-08-20 DIAGNOSIS — E119 Type 2 diabetes mellitus without complications: Secondary | ICD-10-CM | POA: Diagnosis not present

## 2019-08-20 DIAGNOSIS — E7800 Pure hypercholesterolemia, unspecified: Secondary | ICD-10-CM | POA: Diagnosis not present

## 2019-08-21 IMAGING — RF DG ESOPHAGUS
10 of 15 series · 14 of 21 positions shown · non-contrast
Comparison: CT 01/22/2014.

CLINICAL DATA: Dysphagia.

EXAM:
ESOPHOGRAM / BARIUM SWALLOW / BARIUM TABLET STUDY
TECHNIQUE: Combined double contrast and single contrast examination performed
using effervescent crystals, thick barium liquid, and thin barium
liquid. The patient was observed with fluoroscopy swallowing a 13 mm
barium sulphate tablet.
FLUOROSCOPY TIME:  Fluoroscopy Time:  1 minutes 54 seconds
Radiation Exposure Index (if provided by the fluoroscopic device):
MGy
Number of Acquired Spot Images: 28

[Series 1: fluoro_barium 2fps_bw · 0.19mm/px · 2 of 10 frames shown (1 of 9)]
[frame 2/10]
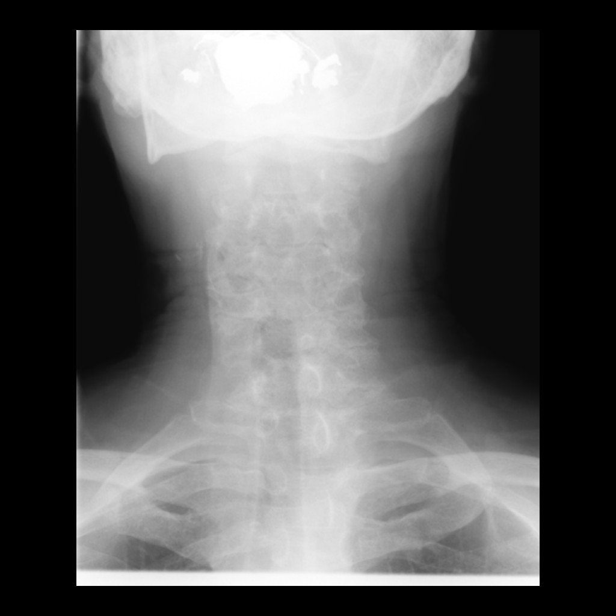
[frame 9/10]
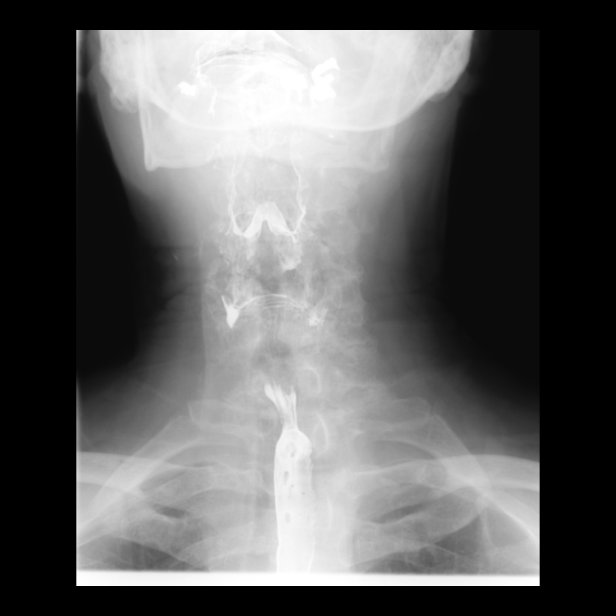

[Series 2: fluoro_barium 2fps_bw · 0.18mm/px · 3 of 4 frames shown (2 of 9)]
[frame 1/4]
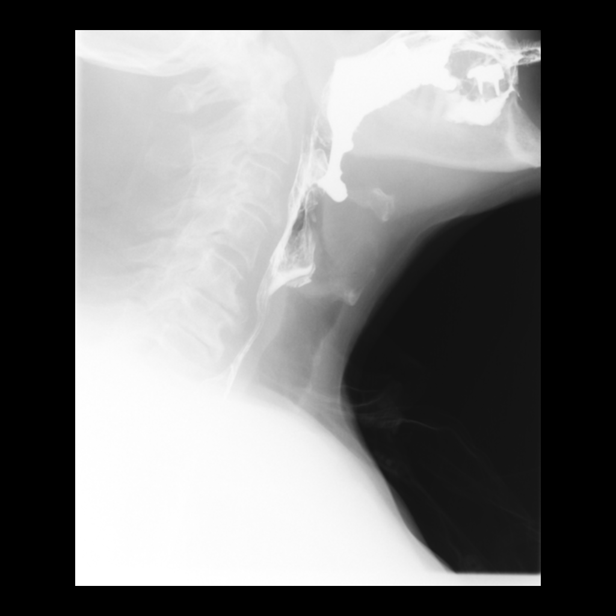
[frame 3/4]
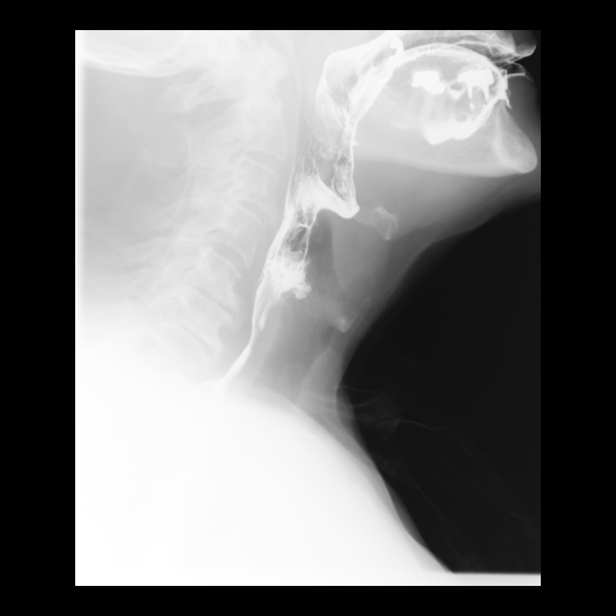
[frame 4/4]
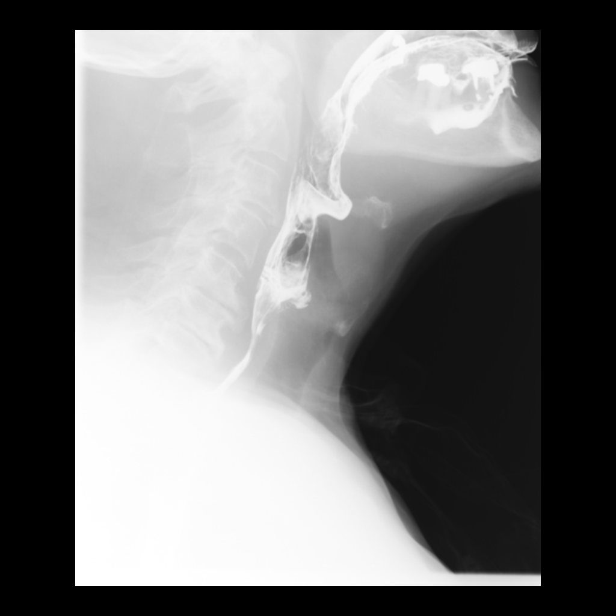

[Series 4: fluoro_barium 2fps_bw · 0.19mm/px · 1 of 1 slices shown (3 of 9)]
[im 1/1]
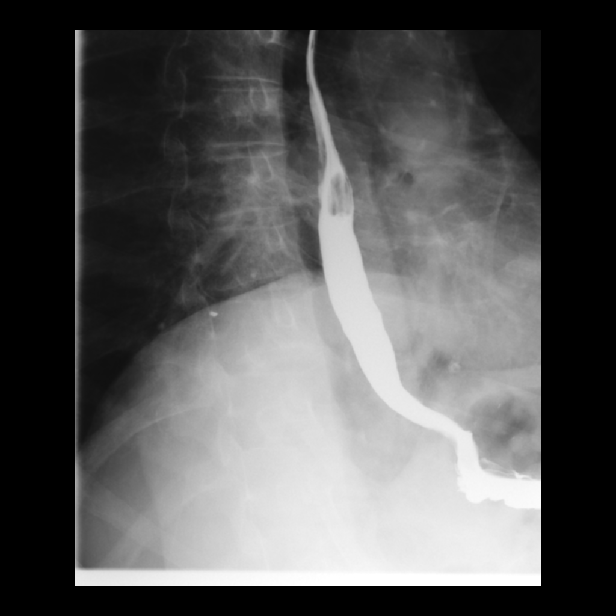

[Series 5: fluoro_barium 2fps_bw · 0.19mm/px · 1 of 1 slices shown (4 of 9)]
[im 1/1]
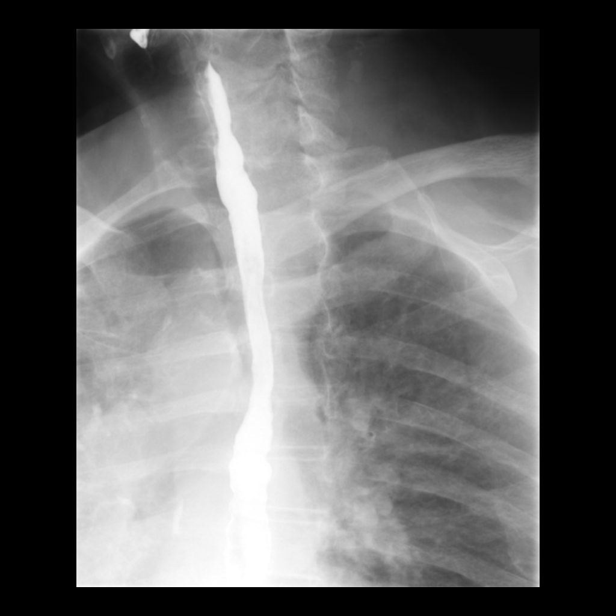

[Series 7: fluoro_barium 2fps_bw · 0.19mm/px · 1 of 1 slices shown (5 of 9)]
[im 1/1]
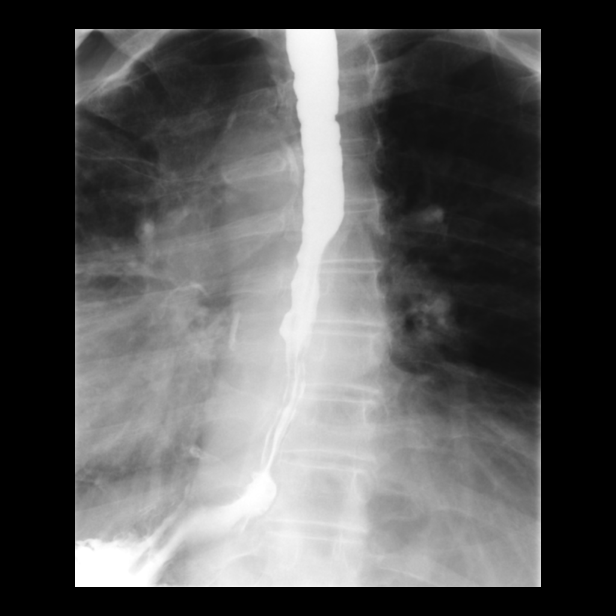

[Series 8: fluoro_barium 2fps_bw · 0.19mm/px · 1 of 1 slices shown (6 of 9)]
[im 1/1]
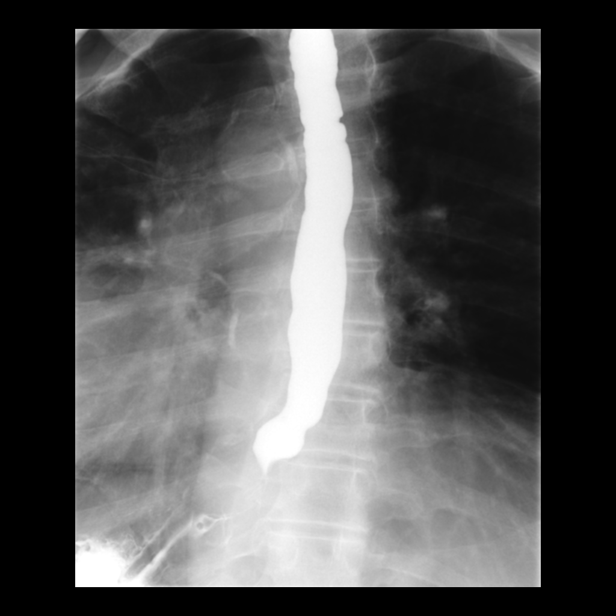

[Series 10: fluoro_barium 2fps_bw · 0.19mm/px · 1 of 1 slices shown (7 of 9)]
[im 1/1]
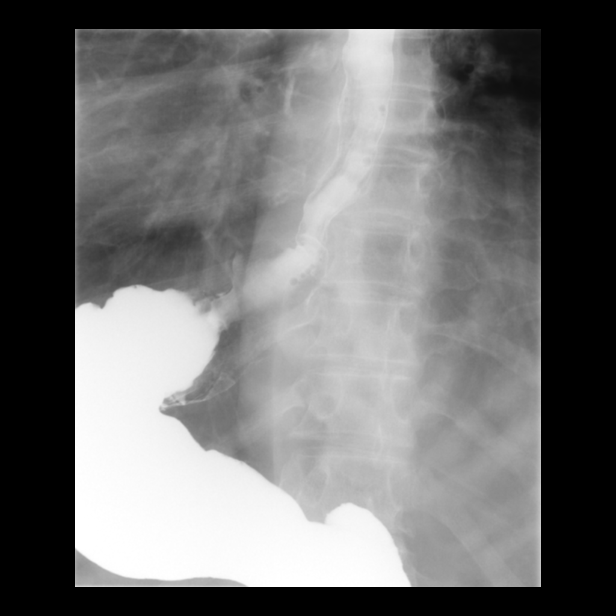

[Series 11: fluoro_barium 2fps_bw · 0.19mm/px · 1 of 1 slices shown (8 of 9)]
[im 1/1]
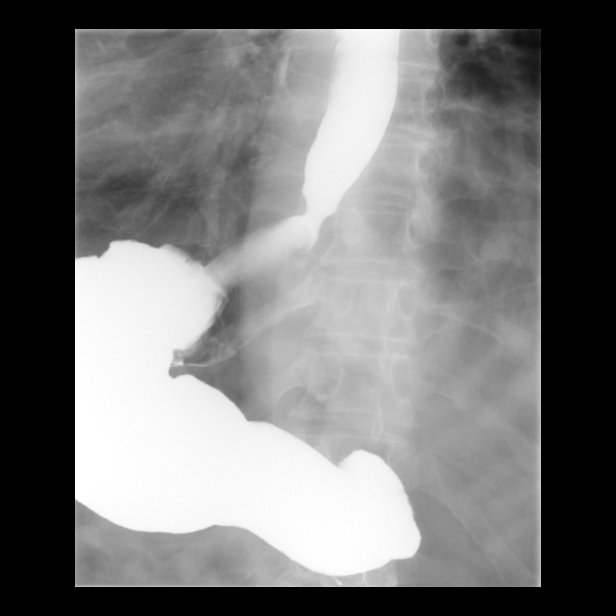

[Series 13: fluoro_barium 2fps_bw · 0.19mm/px · 2 of 2 frames shown (9 of 9)]
[frame 1/2]
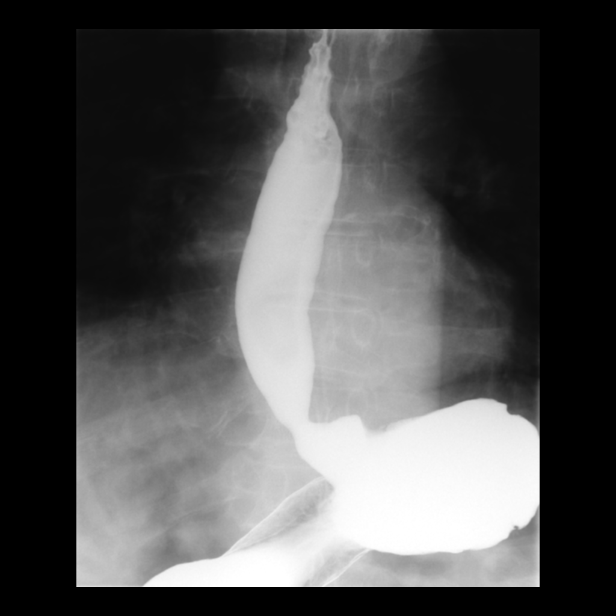
[frame 2/2]
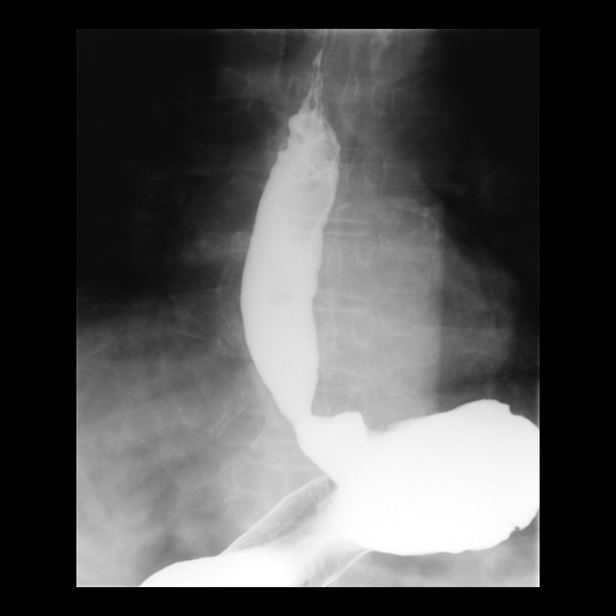

[Series 15: cp_standard · 0.18mm/px · 1 of 1 slices shown]
[im 1/1]
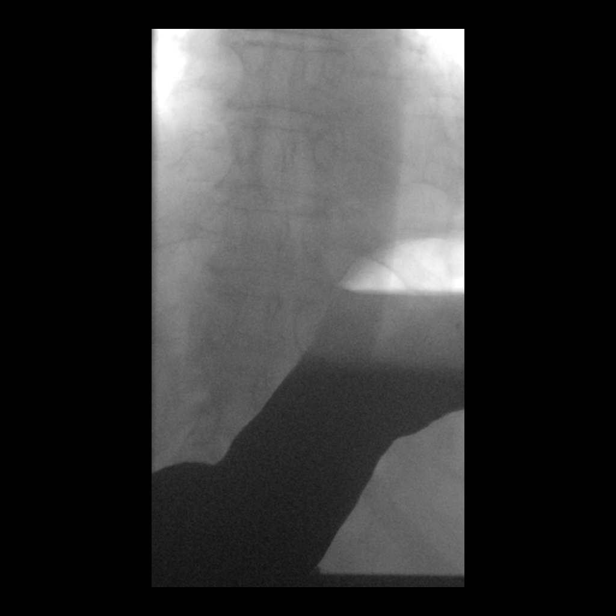

[14 of 21 positions shown; findings below may reference images not displayed]

FINDINGS: Cervical esophagus is widely patent. Tiny anterior esophageal web is
noted. Prominent cervical osteophytes are present resulting in
minimal posterior esophageal deformity. Small sliding hiatal hernia
with prominent gastroesophageal reflux noted. Mild distal esophageal
narrowing is noted just above the hiatal hernia. A mild stricture
cannot be excluded. Endoscopic evaluation suggested. Standardized
barium tablet passed through this narrowing without difficulty. Mild
tertiary esophageal contractions suggesting presbyesophagus.
IMPRESSION: 1. Tiny anterior esophageal web is noted. Prominent cervical
osteophytes are noted resulting in minimal posterior esophageal
deformity. The cervical esophagus is widely patent.

2. Small sliding hiatal hernia with prominent gastroesophageal
reflux. Mild distal esophageal narrowing is noted just above the
hiatal hernia. Mild stricture cannot be excluded. Endoscopic
evaluation suggested. Standardized barium tablet passed through this
of focal area of narrowing without difficulty.

3. Mild tertiary esophageal contractions consistent with
presbyesophagus.

## 2019-08-24 DIAGNOSIS — M545 Low back pain: Secondary | ICD-10-CM | POA: Diagnosis not present

## 2019-08-24 DIAGNOSIS — M130 Polyarthritis, unspecified: Secondary | ICD-10-CM | POA: Diagnosis not present

## 2019-08-24 DIAGNOSIS — F419 Anxiety disorder, unspecified: Secondary | ICD-10-CM | POA: Diagnosis not present

## 2019-08-24 DIAGNOSIS — Z79891 Long term (current) use of opiate analgesic: Secondary | ICD-10-CM | POA: Diagnosis not present

## 2019-08-24 DIAGNOSIS — M25532 Pain in left wrist: Secondary | ICD-10-CM | POA: Diagnosis not present

## 2019-08-24 DIAGNOSIS — M25559 Pain in unspecified hip: Secondary | ICD-10-CM | POA: Diagnosis not present

## 2019-08-24 DIAGNOSIS — M79662 Pain in left lower leg: Secondary | ICD-10-CM | POA: Diagnosis not present

## 2019-08-24 DIAGNOSIS — M5010 Cervical disc disorder with radiculopathy, unspecified cervical region: Secondary | ICD-10-CM | POA: Diagnosis not present

## 2019-08-25 DIAGNOSIS — M19072 Primary osteoarthritis, left ankle and foot: Secondary | ICD-10-CM | POA: Diagnosis not present

## 2019-08-25 DIAGNOSIS — M868X7 Other osteomyelitis, ankle and foot: Secondary | ICD-10-CM | POA: Diagnosis not present

## 2019-08-25 DIAGNOSIS — M869 Osteomyelitis, unspecified: Secondary | ICD-10-CM | POA: Diagnosis not present

## 2019-08-25 DIAGNOSIS — M25572 Pain in left ankle and joints of left foot: Secondary | ICD-10-CM | POA: Diagnosis not present

## 2019-09-05 DIAGNOSIS — I10 Essential (primary) hypertension: Secondary | ICD-10-CM | POA: Diagnosis not present

## 2019-09-05 DIAGNOSIS — Z299 Encounter for prophylactic measures, unspecified: Secondary | ICD-10-CM | POA: Diagnosis not present

## 2019-09-05 DIAGNOSIS — E11319 Type 2 diabetes mellitus with unspecified diabetic retinopathy without macular edema: Secondary | ICD-10-CM | POA: Diagnosis not present

## 2019-09-05 DIAGNOSIS — E1142 Type 2 diabetes mellitus with diabetic polyneuropathy: Secondary | ICD-10-CM | POA: Diagnosis not present

## 2019-09-05 DIAGNOSIS — E1165 Type 2 diabetes mellitus with hyperglycemia: Secondary | ICD-10-CM | POA: Diagnosis not present

## 2019-09-05 DIAGNOSIS — I700 Atherosclerosis of aorta: Secondary | ICD-10-CM | POA: Diagnosis not present

## 2019-09-11 DIAGNOSIS — F320 Major depressive disorder, single episode, mild: Secondary | ICD-10-CM | POA: Diagnosis not present

## 2019-09-19 DIAGNOSIS — Z961 Presence of intraocular lens: Secondary | ICD-10-CM | POA: Diagnosis not present

## 2019-09-19 DIAGNOSIS — E113513 Type 2 diabetes mellitus with proliferative diabetic retinopathy with macular edema, bilateral: Secondary | ICD-10-CM | POA: Diagnosis not present

## 2019-09-19 DIAGNOSIS — D3132 Benign neoplasm of left choroid: Secondary | ICD-10-CM | POA: Diagnosis not present

## 2019-09-19 DIAGNOSIS — H40001 Preglaucoma, unspecified, right eye: Secondary | ICD-10-CM | POA: Diagnosis not present

## 2019-09-20 DIAGNOSIS — I10 Essential (primary) hypertension: Secondary | ICD-10-CM | POA: Diagnosis not present

## 2019-09-20 DIAGNOSIS — E119 Type 2 diabetes mellitus without complications: Secondary | ICD-10-CM | POA: Diagnosis not present

## 2019-09-20 DIAGNOSIS — E7800 Pure hypercholesterolemia, unspecified: Secondary | ICD-10-CM | POA: Diagnosis not present

## 2019-10-20 DIAGNOSIS — E7800 Pure hypercholesterolemia, unspecified: Secondary | ICD-10-CM | POA: Diagnosis not present

## 2019-10-20 DIAGNOSIS — E119 Type 2 diabetes mellitus without complications: Secondary | ICD-10-CM | POA: Diagnosis not present

## 2019-10-20 DIAGNOSIS — I10 Essential (primary) hypertension: Secondary | ICD-10-CM | POA: Diagnosis not present

## 2019-10-31 DIAGNOSIS — I10 Essential (primary) hypertension: Secondary | ICD-10-CM | POA: Diagnosis not present

## 2019-10-31 DIAGNOSIS — E7800 Pure hypercholesterolemia, unspecified: Secondary | ICD-10-CM | POA: Diagnosis not present

## 2019-10-31 DIAGNOSIS — E119 Type 2 diabetes mellitus without complications: Secondary | ICD-10-CM | POA: Diagnosis not present

## 2019-11-16 DIAGNOSIS — F419 Anxiety disorder, unspecified: Secondary | ICD-10-CM | POA: Diagnosis not present

## 2019-11-16 DIAGNOSIS — M5010 Cervical disc disorder with radiculopathy, unspecified cervical region: Secondary | ICD-10-CM | POA: Diagnosis not present

## 2019-11-16 DIAGNOSIS — M130 Polyarthritis, unspecified: Secondary | ICD-10-CM | POA: Diagnosis not present

## 2019-11-16 DIAGNOSIS — M25559 Pain in unspecified hip: Secondary | ICD-10-CM | POA: Diagnosis not present

## 2019-11-16 DIAGNOSIS — M545 Low back pain: Secondary | ICD-10-CM | POA: Diagnosis not present

## 2019-11-16 DIAGNOSIS — M25532 Pain in left wrist: Secondary | ICD-10-CM | POA: Diagnosis not present

## 2019-11-16 DIAGNOSIS — Z79891 Long term (current) use of opiate analgesic: Secondary | ICD-10-CM | POA: Diagnosis not present

## 2019-11-16 DIAGNOSIS — M79662 Pain in left lower leg: Secondary | ICD-10-CM | POA: Diagnosis not present

## 2019-11-21 DIAGNOSIS — Z0100 Encounter for examination of eyes and vision without abnormal findings: Secondary | ICD-10-CM | POA: Diagnosis not present

## 2019-11-21 DIAGNOSIS — H401111 Primary open-angle glaucoma, right eye, mild stage: Secondary | ICD-10-CM | POA: Diagnosis not present

## 2019-11-21 DIAGNOSIS — Z7984 Long term (current) use of oral hypoglycemic drugs: Secondary | ICD-10-CM | POA: Diagnosis not present

## 2019-11-21 DIAGNOSIS — E119 Type 2 diabetes mellitus without complications: Secondary | ICD-10-CM | POA: Diagnosis not present

## 2019-11-21 DIAGNOSIS — E133513 Other specified diabetes mellitus with proliferative diabetic retinopathy with macular edema, bilateral: Secondary | ICD-10-CM | POA: Diagnosis not present

## 2019-11-21 DIAGNOSIS — Z961 Presence of intraocular lens: Secondary | ICD-10-CM | POA: Diagnosis not present

## 2019-11-21 DIAGNOSIS — H353133 Nonexudative age-related macular degeneration, bilateral, advanced atrophic without subfoveal involvement: Secondary | ICD-10-CM | POA: Diagnosis not present

## 2019-11-21 DIAGNOSIS — H524 Presbyopia: Secondary | ICD-10-CM | POA: Diagnosis not present

## 2019-11-21 DIAGNOSIS — Z794 Long term (current) use of insulin: Secondary | ICD-10-CM | POA: Diagnosis not present

## 2019-12-01 DIAGNOSIS — J320 Chronic maxillary sinusitis: Secondary | ICD-10-CM | POA: Diagnosis not present

## 2019-12-01 DIAGNOSIS — F17210 Nicotine dependence, cigarettes, uncomplicated: Secondary | ICD-10-CM | POA: Diagnosis not present

## 2019-12-01 DIAGNOSIS — Z299 Encounter for prophylactic measures, unspecified: Secondary | ICD-10-CM | POA: Diagnosis not present

## 2019-12-21 DIAGNOSIS — E1165 Type 2 diabetes mellitus with hyperglycemia: Secondary | ICD-10-CM | POA: Diagnosis not present

## 2019-12-21 DIAGNOSIS — E7800 Pure hypercholesterolemia, unspecified: Secondary | ICD-10-CM | POA: Diagnosis not present

## 2019-12-21 DIAGNOSIS — E1142 Type 2 diabetes mellitus with diabetic polyneuropathy: Secondary | ICD-10-CM | POA: Diagnosis not present

## 2019-12-21 DIAGNOSIS — Z299 Encounter for prophylactic measures, unspecified: Secondary | ICD-10-CM | POA: Diagnosis not present

## 2019-12-21 DIAGNOSIS — E119 Type 2 diabetes mellitus without complications: Secondary | ICD-10-CM | POA: Diagnosis not present

## 2019-12-21 DIAGNOSIS — F17210 Nicotine dependence, cigarettes, uncomplicated: Secondary | ICD-10-CM | POA: Diagnosis not present

## 2019-12-21 DIAGNOSIS — I700 Atherosclerosis of aorta: Secondary | ICD-10-CM | POA: Diagnosis not present

## 2019-12-21 DIAGNOSIS — I10 Essential (primary) hypertension: Secondary | ICD-10-CM | POA: Diagnosis not present

## 2019-12-25 DIAGNOSIS — L570 Actinic keratosis: Secondary | ICD-10-CM | POA: Diagnosis not present

## 2020-01-09 DIAGNOSIS — Z23 Encounter for immunization: Secondary | ICD-10-CM | POA: Diagnosis not present

## 2020-01-19 DIAGNOSIS — E119 Type 2 diabetes mellitus without complications: Secondary | ICD-10-CM | POA: Diagnosis not present

## 2020-01-19 DIAGNOSIS — I10 Essential (primary) hypertension: Secondary | ICD-10-CM | POA: Diagnosis not present

## 2020-01-19 DIAGNOSIS — E7800 Pure hypercholesterolemia, unspecified: Secondary | ICD-10-CM | POA: Diagnosis not present

## 2020-01-24 DIAGNOSIS — J069 Acute upper respiratory infection, unspecified: Secondary | ICD-10-CM | POA: Diagnosis not present

## 2020-01-31 DIAGNOSIS — D692 Other nonthrombocytopenic purpura: Secondary | ICD-10-CM | POA: Diagnosis not present

## 2020-01-31 DIAGNOSIS — Z299 Encounter for prophylactic measures, unspecified: Secondary | ICD-10-CM | POA: Diagnosis not present

## 2020-01-31 DIAGNOSIS — I10 Essential (primary) hypertension: Secondary | ICD-10-CM | POA: Diagnosis not present

## 2020-01-31 DIAGNOSIS — F419 Anxiety disorder, unspecified: Secondary | ICD-10-CM | POA: Diagnosis not present

## 2020-01-31 DIAGNOSIS — J069 Acute upper respiratory infection, unspecified: Secondary | ICD-10-CM | POA: Diagnosis not present

## 2020-01-31 DIAGNOSIS — E1139 Type 2 diabetes mellitus with other diabetic ophthalmic complication: Secondary | ICD-10-CM | POA: Diagnosis not present

## 2020-01-31 DIAGNOSIS — F17210 Nicotine dependence, cigarettes, uncomplicated: Secondary | ICD-10-CM | POA: Diagnosis not present

## 2020-02-08 DIAGNOSIS — M545 Low back pain, unspecified: Secondary | ICD-10-CM | POA: Diagnosis not present

## 2020-02-08 DIAGNOSIS — M25532 Pain in left wrist: Secondary | ICD-10-CM | POA: Diagnosis not present

## 2020-02-08 DIAGNOSIS — M130 Polyarthritis, unspecified: Secondary | ICD-10-CM | POA: Diagnosis not present

## 2020-02-08 DIAGNOSIS — F419 Anxiety disorder, unspecified: Secondary | ICD-10-CM | POA: Diagnosis not present

## 2020-02-08 DIAGNOSIS — M25559 Pain in unspecified hip: Secondary | ICD-10-CM | POA: Diagnosis not present

## 2020-02-08 DIAGNOSIS — M79662 Pain in left lower leg: Secondary | ICD-10-CM | POA: Diagnosis not present

## 2020-02-08 DIAGNOSIS — M5010 Cervical disc disorder with radiculopathy, unspecified cervical region: Secondary | ICD-10-CM | POA: Diagnosis not present

## 2020-02-08 DIAGNOSIS — Z79891 Long term (current) use of opiate analgesic: Secondary | ICD-10-CM | POA: Diagnosis not present

## 2020-02-13 DIAGNOSIS — E559 Vitamin D deficiency, unspecified: Secondary | ICD-10-CM | POA: Diagnosis not present

## 2020-02-13 DIAGNOSIS — Z0000 Encounter for general adult medical examination without abnormal findings: Secondary | ICD-10-CM | POA: Diagnosis not present

## 2020-02-13 DIAGNOSIS — R5383 Other fatigue: Secondary | ICD-10-CM | POA: Diagnosis not present

## 2020-02-13 DIAGNOSIS — Z7189 Other specified counseling: Secondary | ICD-10-CM | POA: Diagnosis not present

## 2020-02-13 DIAGNOSIS — F419 Anxiety disorder, unspecified: Secondary | ICD-10-CM | POA: Diagnosis not present

## 2020-02-13 DIAGNOSIS — Z79899 Other long term (current) drug therapy: Secondary | ICD-10-CM | POA: Diagnosis not present

## 2020-02-13 DIAGNOSIS — Z125 Encounter for screening for malignant neoplasm of prostate: Secondary | ICD-10-CM | POA: Diagnosis not present

## 2020-02-13 DIAGNOSIS — Z1211 Encounter for screening for malignant neoplasm of colon: Secondary | ICD-10-CM | POA: Diagnosis not present

## 2020-02-13 DIAGNOSIS — J069 Acute upper respiratory infection, unspecified: Secondary | ICD-10-CM | POA: Diagnosis not present

## 2020-02-13 DIAGNOSIS — D509 Iron deficiency anemia, unspecified: Secondary | ICD-10-CM | POA: Diagnosis not present

## 2020-02-13 DIAGNOSIS — I10 Essential (primary) hypertension: Secondary | ICD-10-CM | POA: Diagnosis not present

## 2020-02-13 DIAGNOSIS — F17210 Nicotine dependence, cigarettes, uncomplicated: Secondary | ICD-10-CM | POA: Diagnosis not present

## 2020-02-13 DIAGNOSIS — Z1331 Encounter for screening for depression: Secondary | ICD-10-CM | POA: Diagnosis not present

## 2020-02-13 DIAGNOSIS — E7800 Pure hypercholesterolemia, unspecified: Secondary | ICD-10-CM | POA: Diagnosis not present

## 2020-02-13 DIAGNOSIS — Z1339 Encounter for screening examination for other mental health and behavioral disorders: Secondary | ICD-10-CM | POA: Diagnosis not present

## 2020-02-20 DIAGNOSIS — E7800 Pure hypercholesterolemia, unspecified: Secondary | ICD-10-CM | POA: Diagnosis not present

## 2020-02-20 DIAGNOSIS — I10 Essential (primary) hypertension: Secondary | ICD-10-CM | POA: Diagnosis not present

## 2020-02-20 DIAGNOSIS — E119 Type 2 diabetes mellitus without complications: Secondary | ICD-10-CM | POA: Diagnosis not present

## 2020-03-11 DIAGNOSIS — F320 Major depressive disorder, single episode, mild: Secondary | ICD-10-CM | POA: Diagnosis not present

## 2020-03-21 DIAGNOSIS — E7800 Pure hypercholesterolemia, unspecified: Secondary | ICD-10-CM | POA: Diagnosis not present

## 2020-03-21 DIAGNOSIS — I10 Essential (primary) hypertension: Secondary | ICD-10-CM | POA: Diagnosis not present

## 2020-03-21 DIAGNOSIS — E119 Type 2 diabetes mellitus without complications: Secondary | ICD-10-CM | POA: Diagnosis not present

## 2020-03-28 DIAGNOSIS — E1142 Type 2 diabetes mellitus with diabetic polyneuropathy: Secondary | ICD-10-CM | POA: Diagnosis not present

## 2020-03-28 DIAGNOSIS — Z299 Encounter for prophylactic measures, unspecified: Secondary | ICD-10-CM | POA: Diagnosis not present

## 2020-03-28 DIAGNOSIS — Z6827 Body mass index (BMI) 27.0-27.9, adult: Secondary | ICD-10-CM | POA: Diagnosis not present

## 2020-03-28 DIAGNOSIS — Z87891 Personal history of nicotine dependence: Secondary | ICD-10-CM | POA: Diagnosis not present

## 2020-03-28 DIAGNOSIS — E1165 Type 2 diabetes mellitus with hyperglycemia: Secondary | ICD-10-CM | POA: Diagnosis not present

## 2020-03-28 DIAGNOSIS — E1139 Type 2 diabetes mellitus with other diabetic ophthalmic complication: Secondary | ICD-10-CM | POA: Diagnosis not present

## 2020-03-28 DIAGNOSIS — I10 Essential (primary) hypertension: Secondary | ICD-10-CM | POA: Diagnosis not present

## 2020-05-02 DIAGNOSIS — M130 Polyarthritis, unspecified: Secondary | ICD-10-CM | POA: Diagnosis not present

## 2020-05-02 DIAGNOSIS — G5603 Carpal tunnel syndrome, bilateral upper limbs: Secondary | ICD-10-CM | POA: Diagnosis not present

## 2020-05-02 DIAGNOSIS — M545 Low back pain, unspecified: Secondary | ICD-10-CM | POA: Diagnosis not present

## 2020-05-02 DIAGNOSIS — M25532 Pain in left wrist: Secondary | ICD-10-CM | POA: Diagnosis not present

## 2020-05-02 DIAGNOSIS — M79662 Pain in left lower leg: Secondary | ICD-10-CM | POA: Diagnosis not present

## 2020-05-02 DIAGNOSIS — M5010 Cervical disc disorder with radiculopathy, unspecified cervical region: Secondary | ICD-10-CM | POA: Diagnosis not present

## 2020-05-02 DIAGNOSIS — Z79891 Long term (current) use of opiate analgesic: Secondary | ICD-10-CM | POA: Diagnosis not present

## 2020-05-02 DIAGNOSIS — F419 Anxiety disorder, unspecified: Secondary | ICD-10-CM | POA: Diagnosis not present

## 2020-05-02 DIAGNOSIS — M25559 Pain in unspecified hip: Secondary | ICD-10-CM | POA: Diagnosis not present

## 2020-05-20 DIAGNOSIS — I10 Essential (primary) hypertension: Secondary | ICD-10-CM | POA: Diagnosis not present

## 2020-05-20 DIAGNOSIS — K219 Gastro-esophageal reflux disease without esophagitis: Secondary | ICD-10-CM | POA: Diagnosis not present

## 2020-05-23 DIAGNOSIS — I10 Essential (primary) hypertension: Secondary | ICD-10-CM | POA: Diagnosis not present

## 2020-05-23 DIAGNOSIS — E11319 Type 2 diabetes mellitus with unspecified diabetic retinopathy without macular edema: Secondary | ICD-10-CM | POA: Diagnosis not present

## 2020-05-23 DIAGNOSIS — M7989 Other specified soft tissue disorders: Secondary | ICD-10-CM | POA: Diagnosis not present

## 2020-05-23 DIAGNOSIS — Z299 Encounter for prophylactic measures, unspecified: Secondary | ICD-10-CM | POA: Diagnosis not present

## 2020-05-23 DIAGNOSIS — E1142 Type 2 diabetes mellitus with diabetic polyneuropathy: Secondary | ICD-10-CM | POA: Diagnosis not present

## 2020-05-23 DIAGNOSIS — M25571 Pain in right ankle and joints of right foot: Secondary | ICD-10-CM | POA: Diagnosis not present

## 2020-05-23 DIAGNOSIS — E1165 Type 2 diabetes mellitus with hyperglycemia: Secondary | ICD-10-CM | POA: Diagnosis not present

## 2020-05-28 DIAGNOSIS — M25571 Pain in right ankle and joints of right foot: Secondary | ICD-10-CM | POA: Diagnosis not present

## 2020-05-28 DIAGNOSIS — S8261XA Displaced fracture of lateral malleolus of right fibula, initial encounter for closed fracture: Secondary | ICD-10-CM | POA: Diagnosis not present

## 2020-06-13 DIAGNOSIS — M25571 Pain in right ankle and joints of right foot: Secondary | ICD-10-CM | POA: Diagnosis not present

## 2020-06-24 DIAGNOSIS — L570 Actinic keratosis: Secondary | ICD-10-CM | POA: Diagnosis not present

## 2020-06-27 DIAGNOSIS — M25571 Pain in right ankle and joints of right foot: Secondary | ICD-10-CM | POA: Diagnosis not present

## 2020-07-02 DIAGNOSIS — H31093 Other chorioretinal scars, bilateral: Secondary | ICD-10-CM | POA: Diagnosis not present

## 2020-07-02 DIAGNOSIS — H353133 Nonexudative age-related macular degeneration, bilateral, advanced atrophic without subfoveal involvement: Secondary | ICD-10-CM | POA: Diagnosis not present

## 2020-07-08 DIAGNOSIS — E11319 Type 2 diabetes mellitus with unspecified diabetic retinopathy without macular edema: Secondary | ICD-10-CM | POA: Diagnosis not present

## 2020-07-08 DIAGNOSIS — I10 Essential (primary) hypertension: Secondary | ICD-10-CM | POA: Diagnosis not present

## 2020-07-08 DIAGNOSIS — Z299 Encounter for prophylactic measures, unspecified: Secondary | ICD-10-CM | POA: Diagnosis not present

## 2020-07-08 DIAGNOSIS — E1139 Type 2 diabetes mellitus with other diabetic ophthalmic complication: Secondary | ICD-10-CM | POA: Diagnosis not present

## 2020-07-08 DIAGNOSIS — E1165 Type 2 diabetes mellitus with hyperglycemia: Secondary | ICD-10-CM | POA: Diagnosis not present

## 2020-07-08 DIAGNOSIS — J309 Allergic rhinitis, unspecified: Secondary | ICD-10-CM | POA: Diagnosis not present

## 2020-07-20 DIAGNOSIS — F329 Major depressive disorder, single episode, unspecified: Secondary | ICD-10-CM | POA: Diagnosis not present

## 2020-07-20 DIAGNOSIS — I10 Essential (primary) hypertension: Secondary | ICD-10-CM | POA: Diagnosis not present

## 2020-07-20 DIAGNOSIS — K219 Gastro-esophageal reflux disease without esophagitis: Secondary | ICD-10-CM | POA: Diagnosis not present

## 2020-07-20 DIAGNOSIS — E119 Type 2 diabetes mellitus without complications: Secondary | ICD-10-CM | POA: Diagnosis not present

## 2020-07-25 DIAGNOSIS — M79662 Pain in left lower leg: Secondary | ICD-10-CM | POA: Diagnosis not present

## 2020-07-25 DIAGNOSIS — M25532 Pain in left wrist: Secondary | ICD-10-CM | POA: Diagnosis not present

## 2020-07-25 DIAGNOSIS — M545 Low back pain, unspecified: Secondary | ICD-10-CM | POA: Diagnosis not present

## 2020-07-25 DIAGNOSIS — F419 Anxiety disorder, unspecified: Secondary | ICD-10-CM | POA: Diagnosis not present

## 2020-07-25 DIAGNOSIS — Z79891 Long term (current) use of opiate analgesic: Secondary | ICD-10-CM | POA: Diagnosis not present

## 2020-07-25 DIAGNOSIS — M25559 Pain in unspecified hip: Secondary | ICD-10-CM | POA: Diagnosis not present

## 2020-07-25 DIAGNOSIS — G5603 Carpal tunnel syndrome, bilateral upper limbs: Secondary | ICD-10-CM | POA: Diagnosis not present

## 2020-07-25 DIAGNOSIS — M5010 Cervical disc disorder with radiculopathy, unspecified cervical region: Secondary | ICD-10-CM | POA: Diagnosis not present

## 2020-07-25 DIAGNOSIS — M130 Polyarthritis, unspecified: Secondary | ICD-10-CM | POA: Diagnosis not present

## 2020-08-12 DIAGNOSIS — S8261XA Displaced fracture of lateral malleolus of right fibula, initial encounter for closed fracture: Secondary | ICD-10-CM | POA: Diagnosis not present

## 2020-08-19 DIAGNOSIS — E119 Type 2 diabetes mellitus without complications: Secondary | ICD-10-CM | POA: Diagnosis not present

## 2020-08-19 DIAGNOSIS — F329 Major depressive disorder, single episode, unspecified: Secondary | ICD-10-CM | POA: Diagnosis not present

## 2020-08-19 DIAGNOSIS — K219 Gastro-esophageal reflux disease without esophagitis: Secondary | ICD-10-CM | POA: Diagnosis not present

## 2020-08-19 DIAGNOSIS — I10 Essential (primary) hypertension: Secondary | ICD-10-CM | POA: Diagnosis not present

## 2020-09-14 ENCOUNTER — Encounter (HOSPITAL_COMMUNITY): Payer: Self-pay

## 2020-09-14 ENCOUNTER — Emergency Department (HOSPITAL_COMMUNITY): Payer: Medicare Other

## 2020-09-14 ENCOUNTER — Other Ambulatory Visit: Payer: Self-pay

## 2020-09-14 ENCOUNTER — Observation Stay (HOSPITAL_COMMUNITY)
Admission: EM | Admit: 2020-09-14 | Discharge: 2020-09-15 | Disposition: A | Discharge status: Home / Self Care | Payer: Medicare Other | Attending: Family Medicine | Admitting: Family Medicine

## 2020-09-14 DIAGNOSIS — Z7984 Long term (current) use of oral hypoglycemic drugs: Secondary | ICD-10-CM | POA: Insufficient documentation

## 2020-09-14 DIAGNOSIS — Z20822 Contact with and (suspected) exposure to covid-19: Secondary | ICD-10-CM | POA: Insufficient documentation

## 2020-09-14 DIAGNOSIS — Z7982 Long term (current) use of aspirin: Secondary | ICD-10-CM | POA: Diagnosis not present

## 2020-09-14 DIAGNOSIS — Z794 Long term (current) use of insulin: Secondary | ICD-10-CM | POA: Diagnosis not present

## 2020-09-14 DIAGNOSIS — R031 Nonspecific low blood-pressure reading: Secondary | ICD-10-CM | POA: Diagnosis not present

## 2020-09-14 DIAGNOSIS — M25572 Pain in left ankle and joints of left foot: Secondary | ICD-10-CM

## 2020-09-14 DIAGNOSIS — G9340 Encephalopathy, unspecified: Secondary | ICD-10-CM

## 2020-09-14 DIAGNOSIS — Z85828 Personal history of other malignant neoplasm of skin: Secondary | ICD-10-CM | POA: Insufficient documentation

## 2020-09-14 DIAGNOSIS — K219 Gastro-esophageal reflux disease without esophagitis: Secondary | ICD-10-CM | POA: Diagnosis present

## 2020-09-14 DIAGNOSIS — S0990XA Unspecified injury of head, initial encounter: Secondary | ICD-10-CM | POA: Diagnosis not present

## 2020-09-14 DIAGNOSIS — E119 Type 2 diabetes mellitus without complications: Secondary | ICD-10-CM

## 2020-09-14 DIAGNOSIS — R531 Weakness: Secondary | ICD-10-CM | POA: Diagnosis not present

## 2020-09-14 DIAGNOSIS — Z8679 Personal history of other diseases of the circulatory system: Secondary | ICD-10-CM | POA: Diagnosis not present

## 2020-09-14 DIAGNOSIS — E7800 Pure hypercholesterolemia, unspecified: Secondary | ICD-10-CM | POA: Diagnosis present

## 2020-09-14 DIAGNOSIS — N179 Acute kidney failure, unspecified: Secondary | ICD-10-CM | POA: Insufficient documentation

## 2020-09-14 DIAGNOSIS — R0902 Hypoxemia: Secondary | ICD-10-CM | POA: Diagnosis not present

## 2020-09-14 DIAGNOSIS — M19072 Primary osteoarthritis, left ankle and foot: Secondary | ICD-10-CM | POA: Diagnosis not present

## 2020-09-14 DIAGNOSIS — G9341 Metabolic encephalopathy: Secondary | ICD-10-CM | POA: Diagnosis not present

## 2020-09-14 DIAGNOSIS — M79671 Pain in right foot: Secondary | ICD-10-CM | POA: Diagnosis not present

## 2020-09-14 DIAGNOSIS — D72829 Elevated white blood cell count, unspecified: Secondary | ICD-10-CM | POA: Diagnosis present

## 2020-09-14 DIAGNOSIS — R059 Cough, unspecified: Secondary | ICD-10-CM | POA: Diagnosis not present

## 2020-09-14 DIAGNOSIS — S82892A Other fracture of left lower leg, initial encounter for closed fracture: Secondary | ICD-10-CM | POA: Diagnosis present

## 2020-09-14 DIAGNOSIS — I10 Essential (primary) hypertension: Secondary | ICD-10-CM | POA: Insufficient documentation

## 2020-09-14 DIAGNOSIS — Z981 Arthrodesis status: Secondary | ICD-10-CM

## 2020-09-14 DIAGNOSIS — Z79899 Other long term (current) drug therapy: Secondary | ICD-10-CM | POA: Insufficient documentation

## 2020-09-14 DIAGNOSIS — E876 Hypokalemia: Secondary | ICD-10-CM | POA: Diagnosis present

## 2020-09-14 DIAGNOSIS — S82852K Displaced trimalleolar fracture of left lower leg, subsequent encounter for closed fracture with nonunion: Secondary | ICD-10-CM

## 2020-09-14 DIAGNOSIS — S82842K Displaced bimalleolar fracture of left lower leg, subsequent encounter for closed fracture with nonunion: Secondary | ICD-10-CM | POA: Diagnosis present

## 2020-09-14 DIAGNOSIS — G319 Degenerative disease of nervous system, unspecified: Secondary | ICD-10-CM | POA: Diagnosis not present

## 2020-09-14 DIAGNOSIS — R0689 Other abnormalities of breathing: Secondary | ICD-10-CM | POA: Diagnosis not present

## 2020-09-14 DIAGNOSIS — R296 Repeated falls: Secondary | ICD-10-CM | POA: Diagnosis not present

## 2020-09-14 DIAGNOSIS — E440 Moderate protein-calorie malnutrition: Secondary | ICD-10-CM | POA: Diagnosis present

## 2020-09-14 DIAGNOSIS — I517 Cardiomegaly: Secondary | ICD-10-CM | POA: Diagnosis not present

## 2020-09-14 LAB — URINALYSIS, ROUTINE W REFLEX MICROSCOPIC
Bilirubin Urine: NEGATIVE
Glucose, UA: NEGATIVE mg/dL
Hgb urine dipstick: NEGATIVE
Ketones, ur: NEGATIVE mg/dL
Leukocytes,Ua: NEGATIVE
Nitrite: NEGATIVE
Protein, ur: NEGATIVE mg/dL
Specific Gravity, Urine: 1.015 (ref 1.005–1.030)
pH: 5.5 (ref 5.0–8.0)

## 2020-09-14 LAB — RESP PANEL BY RT-PCR (FLU A&B, COVID) ARPGX2
Influenza A by PCR: NEGATIVE
Influenza B by PCR: NEGATIVE
SARS Coronavirus 2 by RT PCR: NEGATIVE

## 2020-09-14 LAB — CBC WITH DIFFERENTIAL/PLATELET
Abs Immature Granulocytes: 0.08 10*3/uL — ABNORMAL HIGH (ref 0.00–0.07)
Basophils Absolute: 0 10*3/uL (ref 0.0–0.1)
Basophils Relative: 0 %
Eosinophils Absolute: 0 10*3/uL (ref 0.0–0.5)
Eosinophils Relative: 0 %
HCT: 33.5 % — ABNORMAL LOW (ref 39.0–52.0)
Hemoglobin: 11.1 g/dL — ABNORMAL LOW (ref 13.0–17.0)
Immature Granulocytes: 1 %
Lymphocytes Relative: 6 %
Lymphs Abs: 0.9 10*3/uL (ref 0.7–4.0)
MCH: 30.3 pg (ref 26.0–34.0)
MCHC: 33.1 g/dL (ref 30.0–36.0)
MCV: 91.5 fL (ref 80.0–100.0)
Monocytes Absolute: 0.7 10*3/uL (ref 0.1–1.0)
Monocytes Relative: 5 %
Neutro Abs: 12.8 10*3/uL — ABNORMAL HIGH (ref 1.7–7.7)
Neutrophils Relative %: 88 %
Platelets: 195 10*3/uL (ref 150–400)
RBC: 3.66 MIL/uL — ABNORMAL LOW (ref 4.22–5.81)
RDW: 12.8 % (ref 11.5–15.5)
WBC: 14.6 10*3/uL — ABNORMAL HIGH (ref 4.0–10.5)
nRBC: 0 % (ref 0.0–0.2)

## 2020-09-14 LAB — COMPREHENSIVE METABOLIC PANEL
ALT: 11 U/L (ref 0–44)
AST: 18 U/L (ref 15–41)
Albumin: 2.9 g/dL — ABNORMAL LOW (ref 3.5–5.0)
Alkaline Phosphatase: 77 U/L (ref 38–126)
Anion gap: 9 (ref 5–15)
BUN: 17 mg/dL (ref 8–23)
CO2: 27 mmol/L (ref 22–32)
Calcium: 7.8 mg/dL — ABNORMAL LOW (ref 8.9–10.3)
Chloride: 98 mmol/L (ref 98–111)
Creatinine, Ser: 1.51 mg/dL — ABNORMAL HIGH (ref 0.61–1.24)
GFR, Estimated: 48 mL/min — ABNORMAL LOW (ref >60)
Glucose, Bld: 140 mg/dL — ABNORMAL HIGH (ref 70–99)
Potassium: 3.4 mmol/L — ABNORMAL LOW (ref 3.5–5.1)
Sodium: 134 mmol/L — ABNORMAL LOW (ref 135–145)
Total Bilirubin: 0.7 mg/dL (ref 0.3–1.2)
Total Protein: 5.2 g/dL — ABNORMAL LOW (ref 6.5–8.1)

## 2020-09-14 LAB — RAPID URINE DRUG SCREEN, HOSP PERFORMED
Amphetamines: NOT DETECTED
Barbiturates: NOT DETECTED
Benzodiazepines: POSITIVE — AB
Cocaine: NOT DETECTED
Opiates: NOT DETECTED
Tetrahydrocannabinol: NOT DETECTED

## 2020-09-14 LAB — LIPASE, BLOOD: Lipase: 27 U/L (ref 11–51)

## 2020-09-14 LAB — CBG MONITORING, ED: Glucose-Capillary: 114 mg/dL — ABNORMAL HIGH (ref 70–99)

## 2020-09-14 MED ORDER — INSULIN ASPART 100 UNIT/ML IJ SOLN: 0.0000 [IU] | Freq: Every day | INTRAMUSCULAR | Status: DC

## 2020-09-14 MED ORDER — BUPRENORPHINE HCL 2 MG SL SUBL
8.0000 mg | SUBLINGUAL_TABLET | Freq: Two times a day (BID) | SUBLINGUAL | Status: DC
Start: 2020-09-14 — End: 2020-09-15
  Administered 2020-09-15: 8 mg via SUBLINGUAL
  Filled 2020-09-14: qty 4

## 2020-09-14 MED ORDER — HYDROXYZINE HCL 25 MG PO TABS
25.0000 mg | ORAL_TABLET | Freq: Every day | ORAL | Status: DC
  Administered 2020-09-14: 25 mg via ORAL
  Filled 2020-09-14: qty 1

## 2020-09-14 MED ORDER — SODIUM CHLORIDE 0.9 % IV SOLN: INTRAVENOUS | Status: DC

## 2020-09-14 MED ORDER — SENNA 8.6 MG PO TABS
2.0000 | ORAL_TABLET | Freq: Two times a day (BID) | ORAL | Status: DC
  Administered 2020-09-15: 17.2 mg via ORAL
  Filled 2020-09-14: qty 2

## 2020-09-14 MED ORDER — ONDANSETRON HCL 4 MG/2ML IJ SOLN: 4.0000 mg | Freq: Four times a day (QID) | INTRAMUSCULAR | Status: DC | PRN

## 2020-09-14 MED ORDER — SODIUM CHLORIDE 0.9 % IV BOLUS
1000.0000 mL | Freq: Once | INTRAVENOUS | Status: AC
  Administered 2020-09-14: 500 mL via INTRAVENOUS

## 2020-09-14 MED ORDER — HEPARIN SODIUM (PORCINE) 5000 UNIT/ML IJ SOLN: 5000.0000 [IU] | Freq: Three times a day (TID) | INTRAMUSCULAR | Status: DC

## 2020-09-14 MED ORDER — SODIUM CHLORIDE 0.9 % IV SOLN: Freq: Once | INTRAVENOUS | Status: AC

## 2020-09-14 MED ORDER — INSULIN ASPART 100 UNIT/ML IJ SOLN
0.0000 [IU] | Freq: Three times a day (TID) | INTRAMUSCULAR | Status: DC
  Administered 2020-09-15: 5 [IU] via SUBCUTANEOUS
  Filled 2020-09-14: qty 1

## 2020-09-14 MED ORDER — ALPRAZOLAM 0.5 MG PO TABS: 1.0000 mg | ORAL_TABLET | Freq: Three times a day (TID) | ORAL | Status: DC

## 2020-09-14 MED ORDER — METOPROLOL SUCCINATE ER 25 MG PO TB24
25.0000 mg | ORAL_TABLET | Freq: Every day | ORAL | Status: DC
  Administered 2020-09-15: 25 mg via ORAL
  Filled 2020-09-14: qty 1

## 2020-09-14 MED ORDER — ONDANSETRON HCL 4 MG PO TABS: 4.0000 mg | ORAL_TABLET | Freq: Four times a day (QID) | ORAL | Status: DC | PRN

## 2020-09-14 MED ORDER — ACETAMINOPHEN 650 MG RE SUPP: 650.0000 mg | Freq: Four times a day (QID) | RECTAL | Status: DC | PRN

## 2020-09-14 MED ORDER — INSULIN GLARGINE 100 UNIT/ML ~~LOC~~ SOLN
25.0000 [IU] | Freq: Every day | SUBCUTANEOUS | Status: DC
  Filled 2020-09-14 (×2): qty 0.25

## 2020-09-14 MED ORDER — DULOXETINE HCL 30 MG PO CPEP
30.0000 mg | ORAL_CAPSULE | Freq: Every day | ORAL | Status: DC
  Administered 2020-09-15: 30 mg via ORAL
  Filled 2020-09-14: qty 1

## 2020-09-14 MED ORDER — ACETAMINOPHEN 325 MG PO TABS: 650.0000 mg | ORAL_TABLET | Freq: Four times a day (QID) | ORAL | Status: DC | PRN

## 2020-09-14 NOTE — ED Provider Notes (Addendum)
[facility] EMERGENCY DEPARTMENT Provider Note   CSN: [id number] Arrival date & time: 09/14/20  1150     History Chief Complaint  Patient presents with   Hypotension    Jason Woods is a 75 y.o. male.  HPI  Patient with significant medical history of CP, chronic back pain, left ankle fracture, hyperlipidemia, legally blind, type 2 diabetes presents to the emergency department after having multiple falls.  Patient states today he got up and was walking lost his balance and his back hit the grandfather clock, denies hitting his head, losing conscious, he is not on anticoagulant.  Patient states he simply lost his balance, denies having chest pain, shortness of breath, feeling lightheaded or dizziness, becoming diaphoretic, prior to the fall.  Patient states he has no complaints this time, and that the only reason he is here is because his family member said he had to be checked out.  He denies headaches, change in vision, paresthesias or weakness in the upper or lower extremities, he denies neck, back pain, abdominal pain, hip pain or lower extremity pain.  It is noted that patient is on buprenorphine, Vicodin and Xanax, he states he took his normal dose, does not take more than he was supposed to.  He has no other complaints at this time.  Patient's sister was at bedside and states that patient needs a full work-up, states that he has been complaining of cough, abdominal pain, has been unable to hold anything down, she also states that his feet have been hurting him.  She states that this been going for last couple days, she is unsure what is happening to the patient.  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    Depression    GERD  (gastroesophageal reflux disease)    High cholesterol    History of kidney stones    Hypertension    Legally blind    Skin cancer    "frozen off my face, arms" (01/07/2018)   Type II diabetes mellitus (Jason Woods)    Vitamin B deficiency 03/09/2017    Patient Active Problem List   Diagnosis Date Noted   Trimalleolar fracture of ankle, closed, left, with nonunion, subsequent encounter 12/01/2018   Acute osteomyelitis of left ankle ([place]) 11/2018   S/P ankle fusion 01/27/2018   Fracture of left ankle 01/07/2018   Closed bimalleolar fracture of left ankle with nonunion 01/07/2018   Open left ankle fracture 10/05/2017   Trimalleolar fracture, left, open type I or II, with nonunion, subsequent encounter 10/05/2017   PNA (pneumonia) 04/29/2017   Esophageal dysphagia 03/31/2017   Abnormal esophagram 03/31/2017   Diabetes ([place]) 03/09/2017   Vitamin B deficiency 03/09/2017   High cholesterol    Hypertension    GERD (gastroesophageal reflux disease)     Past Surgical History:  Procedure Laterality Date   ANKLE FUSION Left 01/12/2018   Procedure: LEFT ANKLE REMOVAL OF EXTERNAL FIXATOR, ANKLE FUSION WITH PHOENIX NAIL;  Surgeon: [name], MD;  Location: [place];  Service: Orthopedics;  Laterality: Left;   ANKLE HARDWARE REMOVAL Left 01/07/2018   REMOVAL OF HARDWARE, TAKE DOWN OF NONUNION, INSERTION OF ANTIBIOTC BEADS/notes 01/07/2018   BIOPSY  04/23/2017   Procedure: BIOPSY;  Surgeon: Rogene Houston, MD;  [facility];  Service: Endoscopy;;  gastric   CATARACT EXTRACTION W/ INTRAOCULAR LENS  IMPLANT, BILATERAL Bilateral    ESOPHAGEAL DILATION N/A 04/23/2017   Procedure: ESOPHAGEAL DILATION;  Surgeon: Rogene Houston, MD;  [facility];  Service: Endoscopy;  Laterality: N/A;   ESOPHAGOGASTRODUODENOSCOPY N/A 04/23/2017   Procedure: ESOPHAGOGASTRODUODENOSCOPY (EGD);  Surgeon: Rogene Houston, MD;  [facility];  Service: Endoscopy;  Laterality: N/A;  9:55-moved to  1040 per Ann   EXTERNAL FIXATION LEG Left 01/07/2018   EXTERNAL FIXATION LEG, TIBIA TO CALCANEUS/notes 01/07/2018   EXTERNAL FIXATION LEG Left 01/07/2018   Procedure: EXTERNAL FIXATION LEG, TIBIA TO CALCANEUS;  Surgeon: [name], MD;  Location: [place];  Service: Orthopedics;  Laterality: Left;   EXTERNAL FIXATION REMOVAL Left 01/12/2018   Procedure: REMOVAL EXTERNAL FIXATION ANKLE;  Surgeon: [name], MD;  Location: [place];  Service: Orthopedics;  Laterality: Left;   FRACTURE SURGERY     HARDWARE REMOVAL Left 01/07/2018   Procedure: LEFT ANKLE REMOVAL OF HARDWARE, TAKE DOWN OF NONUNION, INSERTION OF ANTIBIOTC BEADS;  Surgeon: [name], MD;  Location: [place];  Service: Orthopedics;  Laterality: Left;   HARDWARE REMOVAL Left 12/01/2018   Procedure: Left calcaneus, talus, and tibia removal of deep implants; irrigation and excisional debridement;  Surgeon: Wylene Simmer, MD;  Location: [place];  Service: Orthopedics;  Laterality: Left;   I & D EXTREMITY Left 10/05/2017   Procedure: IRRIGATION AND DEBRIDEMENT QPEN ANKLE FRACTURE;  Surgeon: [name], MD;  Location: [place];  Service: Orthopedics;  Laterality: Left;   INNER EAR SURGERY Right 1962   "scraped the bone; couldn't hear out of it; was infected"   MULTIPLE TOOTH EXTRACTIONS  2018   ORIF ANKLE FRACTURE Left 10/05/2017   Procedure: OPEN REDUCTION INTERNAL FIXATION TRIMALLEOLAR (ORIF)ANKLE FRACTURE;  Surgeon: [name], MD;  Location: [place];  Service: Orthopedics;  Laterality: Left;   WRIST FRACTURE SURGERY Left 2018       Family History  Problem Relation Age of Onset   Diabetes Mother    Heart attack Father    Diabetes Sister    Diabetes Brother     Social History   Tobacco Use   Smoking status: Every Day    Packs/day: 1.00    Years: 62.00    Pack years: 62.00    Types: Cigarettes   Smokeless tobacco: Never  Vaping Use   Vaping Use: Never used  Substance Use Topics   Alcohol use: Not Currently    Comment:   "nothing since the 1990s"   Drug use: Not Currently    Comment:   "nothing since 1980s"    Home Medications Prior to Admission medications   Medication Sig Start Date End Date Taking? Authorizing Provider  ALPRAZolam Duanne Moron) 1 MG tablet Take 1 mg by mouth 3 (three) times daily.    Yes [provider]  aspirin EC 81 MG tablet Take 1 tablet (81 mg total) by mouth 2 (two) times daily. Patient not taking: No sig reported 12/06/18   Corky Sing, PA-C  b complex vitamins tablet Take 1 tablet by mouth daily. With C    [provider]  bimatoprost (LUMIGAN) 0.01 % SOLN Place 1 drop into the right eye at bedtime.    [provider]  buprenorphine (SUBUTEX) 8 MG SUBL SL tablet Place 8 mg under the tongue 2 (two) times daily.  09/29/17  [provider]  Cholecalciferol (D 2000) 2000 units TABS Take 2,000 Units by mouth daily.    [provider]  cyanocobalamin 2000 MCG tablet Take 2,000 mcg by mouth daily.    [provider]  docusate sodium (COLACE) 100 MG capsule Take 1 capsule (100 mg total) by mouth 2 (two) times daily. While taking narcotic pain medicine. 12/06/18   Corky Sing, PA-C  dorzolamide (TRUSOPT) 2 % ophthalmic solution Place 1 drop into both eyes 3 (three) times daily.  04/15/17   [provider]  DULoxetine (CYMBALTA) 30 MG capsule Take 30 mg by mouth daily.  10/11/17   [provider]  fluticasone (FLONASE) 50 MCG/ACT nasal spray Place 2 sprays into both nostrils daily.     [provider]  gabapentin (NEURONTIN) 400 MG capsule Take 400 mg by mouth 2 (two) times daily.     [provider]  hydrochlorothiazide (HYDRODIURIL) 25 MG tablet Take 25 mg by mouth daily. 10/11/17   [provider]  HYDROcodone-acetaminophen (NORCO/VICODIN) 5-325 MG tablet hydrocodone 5 mg-acetaminophen 325 mg tablet  Take 1 tablet every 4 hours by oral route as needed.    [provider]   hydrOXYzine (ATARAX/VISTARIL) 25 MG tablet Take 25 mg by mouth at bedtime. 06/28/18   [provider]  insulin aspart (NOVOLOG FLEXPEN) 100 UNIT/ML FlexPen Novolog Flexpen U-100 Insulin aspart 100 unit/mL (3 mL) subcutaneous  INJECT INTO THE SKIN BEFORE MEALS PER SLIDING SCALE. MAX DOSE 30 UNITS PER DAY    [provider]  insulin glargine (LANTUS) 100 UNIT/ML injection Inject 0.1 mLs (10 Units total) into the skin at bedtime. Patient taking differently: Inject 10-18 Units into the skin See admin instructions. Per sliding  16 units in the morning and 8-10 units in the evening 10/08/17   Velna Ochs, MD  linaclotide [facility]) 145 MCG CAPS capsule Take 145 mcg by mouth daily before breakfast.    [provider]  losartan (COZAAR) 25 MG tablet Take 1 tablet (25 mg total) by mouth daily. 10/08/17   Velna Ochs, MD  metFORMIN (GLUCOPHAGE) 850 MG tablet Take 850 mg by mouth 2 (two) times daily with a meal.    [provider]  metoprolol succinate (TOPROL-XL) 25 MG 24 hr tablet Take 25 mg by mouth daily. 06/28/18   [provider]  Multiple Vitamin (MULTIVITAMIN WITH MINERALS) TABS tablet Take 1 tablet by mouth daily.    [provider]  oxyCODONE (ROXICODONE) 5 MG immediate release tablet Take 1 tablet (5 mg total) by mouth every 4 (four) hours as needed for moderate pain or severe pain. 12/06/18   Corky Sing, PA-C  pantoprazole (PROTONIX) 40 MG tablet Take 40 mg by mouth daily.    [provider]  promethazine (PHENERGAN) 25 MG tablet Take 25 mg by mouth 3 (three) times daily as needed. 11/22/18   [provider]  senna (SENOKOT) 8.6 MG TABS tablet Take 2 tablets (17.2 mg total) by mouth 2 (two) times daily. 12/06/18   Corky Sing, PA-C  triamcinolone cream (KENALOG) 0.1 % Apply 1 application topically daily as needed (affected area(s) on skin).  04/15/17   [provider]    Allergies    Lipitor  [atorvastatin], Zocor [simvastatin-high dose], Celexa [citalopram hydrobromide], Crestor [rosuvastatin], Mobic [meloxicam], and Prozac [fluoxetine hcl]  Review of Systems   Review of Systems  Constitutional:  Negative for chills and fever.  HENT:  Negative for congestion.   Respiratory:  Negative for  shortness of breath.   Cardiovascular:  Negative for chest pain.  Gastrointestinal:  Negative for abdominal pain.  Genitourinary:  Negative for enuresis.  Musculoskeletal:  Negative for back pain.  Skin:  Negative for rash.  Neurological:  Negative for dizziness.  Hematological:  Does not bruise/bleed easily.   Physical Exam Updated Vital Signs BP 128/61   Pulse 68   Temp 98.4 F (36.9 C) (Oral)   Resp 16   Ht 5\' 6"  (1.676 m)   Wt 78 kg   SpO2 95%   BMI 27.76 kg/m   Physical Exam Vitals and nursing note reviewed.  Constitutional:      General: He is not in acute distress.    Appearance: He is not ill-appearing.     Comments: Patient did not appear to be in acute distress, he was slightly somnolent on my exam, but was easily aroused, able to answer questions.  He is alert and oriented x4.  HENT:     Head: Normocephalic and atraumatic.     Nose: No congestion.  Eyes:     Extraocular Movements: Extraocular movements intact.     Conjunctiva/sclera: Conjunctivae normal.     Comments: Patient is noted pinpoint pupils but were reactive to light and equal bilaterally.  Cardiovascular:     Rate and Rhythm: Normal rate and regular rhythm.     Pulses: Normal pulses.     Heart sounds: No murmur heard.   No friction rub. No gallop.  Pulmonary:     Effort: No respiratory distress.     Breath sounds: No wheezing, rhonchi or rales.  Abdominal:     Palpations: Abdomen is soft.     Tenderness: There is no abdominal tenderness. There is no right CVA tenderness or left CVA tenderness.  Musculoskeletal:     Comments: Spine was palpated was nontender to palpation.  Patient has 5 out of 5  strength, neurovascular tact in the upper lower extremities.  Chest was palpated was nontender to palpation.  Skin:    General: Skin is warm and dry.  Neurological:     Mental Status: He is alert and oriented to person, place, and time.     GCS: GCS eye subscore is 4. GCS verbal subscore is 5. GCS motor subscore is 6.     Cranial Nerves: No facial asymmetry.     Sensory: Sensation is intact.     Motor: No weakness.     Coordination: Romberg sign negative. Finger-Nose-Finger Test normal.     Comments: Cranial nerves II through XII grossly intact  Patient had no difficulty with word finding.  Psychiatric:        Mood and Affect: Mood normal.    ED Results / Procedures / Treatments   Labs (all labs ordered are listed, but only abnormal results are displayed) Labs Reviewed  COMPREHENSIVE METABOLIC PANEL - Abnormal; Notable for the following components:      Result Value   Sodium 134 (*)    Potassium 3.4 (*)    Glucose, Bld 140 (*)    Creatinine, Ser 1.51 (*)    Calcium 7.8 (*)    Total Protein 5.2 (*)    Albumin 2.9 (*)    GFR, Estimated 48 (*)    All other components within normal limits  CBC WITH DIFFERENTIAL/PLATELET - Abnormal; Notable for the following components:   WBC 14.6 (*)    RBC 3.66 (*)    Hemoglobin 11.1 (*)    HCT 33.5 (*)  Neutro Abs 12.8 (*)    Abs Immature Granulocytes 0.08 (*)    All other components within normal limits  URINALYSIS, ROUTINE W REFLEX MICROSCOPIC - Abnormal; Notable for the following components:   APPearance HAZY (*)    All other components within normal limits  RAPID URINE DRUG SCREEN, HOSP PERFORMED - Abnormal; Notable for the following components:   Benzodiazepines POSITIVE (*)    All other components within normal limits  RESP PANEL BY RT-PCR (FLU A&B, COVID) ARPGX2  CULTURE, BLOOD (ROUTINE X 2)  CULTURE, BLOOD (ROUTINE X 2)  LIPASE, BLOOD  RAPID URINE DRUG SCREEN, HOSP PERFORMED    EKG EKG  Interpretation  Date/Time:  Saturday September 14 2020 12:00:08 EDT Ventricular Rate:  76 PR Interval:  187 QRS Duration: 96 QT Interval:  395 QTC Calculation: 445 R Axis:   42 Text Interpretation: Sinus rhythm Ventricular premature complex Inferior infarct, old Lateral leads are also involved Since last tracing T wave inversion have improved pvc is now present Confirmed by Calvert Cantor [id number]) on 09/14/2020 1:44:09 PM  Radiology CT Head Wo Contrast  Result Date: 09/14/2020 CLINICAL DATA:  Head trauma.  Low blood pressure. EXAM: CT HEAD WITHOUT CONTRAST TECHNIQUE: Contiguous axial images were obtained from the base of the skull through the vertex without intravenous contrast. COMPARISON:  None FINDINGS: Brain: No evidence of acute infarction, hemorrhage, hydrocephalus, extra-axial collection or mass lesion/mass effect. Signs of atrophy and of chronic microvascular ischemic change Vascular: No hyperdense vessel or unexpected calcification. Skull: Signs of previous RIGHT mastoidectomy. No acute or destructive process related to the calvarium Sinuses/Orbits: No acute finding. Other: None IMPRESSION: 1. No acute intracranial pathology. 2. Signs of atrophy and chronic microvascular ischemic change. 3. Signs of previous RIGHT mastoidectomy. Electronically Signed   By: Zetta Bills M.D.   On: 09/14/2020 13:52   DG Foot Complete Left  Result Date: 09/14/2020 CLINICAL DATA:  Foot pain, several falls within the last couple of days. Bilateral foot pain and soreness. EXAM: LEFT FOOT - COMPLETE 3+ VIEW COMPARISON:  CT of the LEFT ankle from August of 2020. Plain film of the LEFT ankle from July of 2016. FINDINGS: Osteopenia. Degenerative changes throughout the midfoot. These changes are marked and limited assessment. Fracture of an implant extending from the tibia through the calcaneus related to bony fusion about the LEFT ankle. Since previous CT imaging the intramedullary device that was secured with locking  screws has been replaced by this device. Marked degenerative process in tibia talar joint. No signs of fracture about the midfoot or forefoot. IMPRESSION: Fracture of a cement nail with guidewire extending from the medullary canal of the tibia into the subtalar joint and through the calcaneus at the site of previous hardware. Given this appearance dedicated ankle imaging may be helpful for further assessment as warranted. Marked degenerative changes without signs of acute fracture. Electronically Signed   By: Zetta Bills M.D.   On: 09/14/2020 17:18   DG Foot Complete Right  Result Date: 09/14/2020 CLINICAL DATA:  Foot pain, falls. EXAM: RIGHT FOOT COMPLETE - 3+ VIEW COMPARISON:  None FINDINGS: Degenerative changes about the ankle and midfoot. Osteopenia. Signs of vascular calcifications soft tissues. No sign of acute fracture or dislocation. Plantar spurring along the plantar surface shin of the calcaneus. IMPRESSION: 1. No sign of acute fracture or dislocation. 2. Osteopenia and degenerative changes. 3. Irregularity of the lateral malleolus is noted on AP view. The patient reportedly has a history of a minimally displaced fracture in  this location based on reports from May of 2022 from [facility]. Correlate with any worsening pain in this area. Electronically Signed   By: Zetta Bills M.D.   On: 09/14/2020 17:22    Procedures Procedures   Medications Ordered in ED Medications  0.9 %  sodium chloride infusion ( Intravenous Stopped 09/14/20 1407)  sodium chloride 0.9 % bolus 1,000 mL (0 mLs Intravenous Stopped 09/14/20 1654)    ED Course  I have reviewed the triage vital signs and the nursing notes.  Pertinent labs & imaging results that were available during my care of the patient were reviewed by me and considered in my medical decision making (see chart for details).    MDM Rules/Calculators/A&P                         Initial impression-patient presents after a fall.  He is alert,  does not appear in acute distress, vital signs notable for hypotension with systolic of 98.  Suspect encephalopathy secondary due to [place].  Will obtain basic lab work-up, CT head, provide patient with fluids, and reassess.  Work-up-CBC shows leukocytosis 14.6, neuropsych anemia with hemoglobin 11.1, CMP shows hyponatremia 134, hypokalemia 3.4, hyperglycemia 140, creatinine 1.51, respiratory panel negative  Reassessment-patient was reassessed continues to be somnolent on my exam, slightly more arousable, will continue to monitor.  Will p.o. challenge and ambulate the patient when he is more alert.  Patient was attempted to ambulate, had to stop as he was extremely off balance, continues to be somnolent.  Will In-N-Out cath for evaluation of urine.  Of note there was fracture cement screw seen on DG of left foot, patient originally had hardware after suffering a trimal fracture in 2019 but was removed due to infection in 2020 by Dr. Merlinda Frederick. I doubt this is an acute finding as he is non tender on my exam. Possible this could contributing to patients balance issues   Patient continues to be altered, unable to ambulate, will recommend admission due to encephalopathy most likely polypharmacy.  Consult-spoke with Dr. Joesph Fillers of the hospitalist team he would like results of the UA as well as chest x-ray to come prior to admission to further work-up for infectious causes.  Rule out-low suspicion for intracranial head bleed or cranial mass as there is no findings seen on CT head.  Low suspicion for CVA or subarachnoid bleed as patient denies headaches, change in vision, paresthesia weakness upper lower extremities, there is no focal deficits present my exam.  Patient is noted to be off balance but I suspect this might be secondary due to polypharmacy, patient is not within the window for treatment of a large vessel occlusion as this has been on for last 6 months, will defer heparin at this time.  Low  suspicion for meningitis as there is no meningeal sign present my exam.  Low suspicion for intra-abdominal abnormality as abdomen soft nontender to palpation, tolerating p.o. without difficulty.  Low suspicion for ACS as patient has chest pain, shortness of breath, EKG without signs of ischemia.  Low session for spinal cord abnormality or spinal fracture spine was palpated nontender to palpation.  I have low suspicion for systemic infection as patient nontoxic-appearing.  He was noted to be hypotensive but i suspect this secondary to dehydration as he was extreme dry my exam with positive orthostatics, this also improved after fluids.  It was also noted that he has a white count 14 but I  suspect this more acute phase reaction, there is no obvious infection on exam, lung sounds were clear bilaterally, patient denies coughing or urinary symptoms, abdomen soft nontender to palpation, tolerating p.o.  Plan-due to shift change patiently handed over to [place] PAC he is provided HPI, current work-up, likely disposition.  Pending results of UA and chest x-ray patient should be admitted due to encephalopathy, spoke with Dr. Joesph Fillers, recommend speaking with him about this patient after results are completed.   Final Clinical Impression(s) / ED Diagnoses Final diagnoses:  Encephalopathy    Rx / DC Orders ED Discharge Orders     None        Marcello Fennel, PA-C 09/14/20 1930    Marcello Fennel, PA-C 09/14/20 1936    Marcello Fennel, PA-C 09/14/20 2020    Truddie Hidden, MD 09/15/20 [phone]

## 2020-09-14 NOTE — ED Notes (Addendum)
Refusing in/out cath. Attempted to use urinal without success.

## 2020-09-14 NOTE — ED Triage Notes (Signed)
EMS called to patient's home for multiple falls this am and "diabetic" issues. EMS reports that patient is orthostatic and SBP drops to low 80's when standing. CBG per EMS 121.

## 2020-09-14 NOTE — ED Notes (Signed)
Patient to CT at this time

## 2020-09-14 NOTE — ED Notes (Signed)
Pt still unsteady on his feet, unable to walk without assistance at this time.

## 2020-09-15 DIAGNOSIS — D72829 Elevated white blood cell count, unspecified: Secondary | ICD-10-CM | POA: Diagnosis not present

## 2020-09-15 DIAGNOSIS — Z794 Long term (current) use of insulin: Secondary | ICD-10-CM

## 2020-09-15 DIAGNOSIS — G9341 Metabolic encephalopathy: Secondary | ICD-10-CM

## 2020-09-15 DIAGNOSIS — K219 Gastro-esophageal reflux disease without esophagitis: Secondary | ICD-10-CM

## 2020-09-15 DIAGNOSIS — N179 Acute kidney failure, unspecified: Secondary | ICD-10-CM | POA: Diagnosis not present

## 2020-09-15 DIAGNOSIS — E119 Type 2 diabetes mellitus without complications: Secondary | ICD-10-CM | POA: Diagnosis not present

## 2020-09-15 DIAGNOSIS — E440 Moderate protein-calorie malnutrition: Secondary | ICD-10-CM | POA: Diagnosis present

## 2020-09-15 DIAGNOSIS — E876 Hypokalemia: Secondary | ICD-10-CM | POA: Diagnosis not present

## 2020-09-15 LAB — CBC WITH DIFFERENTIAL/PLATELET
Abs Immature Granulocytes: 0.04 10*3/uL (ref 0.00–0.07)
Basophils Absolute: 0 10*3/uL (ref 0.0–0.1)
Basophils Relative: 0 %
Eosinophils Absolute: 0.1 10*3/uL (ref 0.0–0.5)
Eosinophils Relative: 1 %
HCT: 34.2 % — ABNORMAL LOW (ref 39.0–52.0)
Hemoglobin: 11.5 g/dL — ABNORMAL LOW (ref 13.0–17.0)
Immature Granulocytes: 0 %
Lymphocytes Relative: 10 %
Lymphs Abs: 1.1 10*3/uL (ref 0.7–4.0)
MCH: 30.9 pg (ref 26.0–34.0)
MCHC: 33.6 g/dL (ref 30.0–36.0)
MCV: 91.9 fL (ref 80.0–100.0)
Monocytes Absolute: 0.8 10*3/uL (ref 0.1–1.0)
Monocytes Relative: 7 %
Neutro Abs: 8.3 10*3/uL — ABNORMAL HIGH (ref 1.7–7.7)
Neutrophils Relative %: 82 %
Platelets: 189 10*3/uL (ref 150–400)
RBC: 3.72 MIL/uL — ABNORMAL LOW (ref 4.22–5.81)
RDW: 12.9 % (ref 11.5–15.5)
WBC: 10.2 10*3/uL (ref 4.0–10.5)
nRBC: 0 % (ref 0.0–0.2)

## 2020-09-15 LAB — CBG MONITORING, ED
Glucose-Capillary: 112 mg/dL — ABNORMAL HIGH (ref 70–99)
Glucose-Capillary: 226 mg/dL — ABNORMAL HIGH (ref 70–99)

## 2020-09-15 LAB — COMPREHENSIVE METABOLIC PANEL
ALT: 12 U/L (ref 0–44)
AST: 17 U/L (ref 15–41)
Albumin: 3.1 g/dL — ABNORMAL LOW (ref 3.5–5.0)
Alkaline Phosphatase: 80 U/L (ref 38–126)
Anion gap: 7 (ref 5–15)
BUN: 16 mg/dL (ref 8–23)
CO2: 29 mmol/L (ref 22–32)
Calcium: 7.9 mg/dL — ABNORMAL LOW (ref 8.9–10.3)
Chloride: 98 mmol/L (ref 98–111)
Creatinine, Ser: 1.29 mg/dL — ABNORMAL HIGH (ref 0.61–1.24)
GFR, Estimated: 58 mL/min — ABNORMAL LOW (ref >60)
Glucose, Bld: 116 mg/dL — ABNORMAL HIGH (ref 70–99)
Potassium: 3.2 mmol/L — ABNORMAL LOW (ref 3.5–5.1)
Sodium: 134 mmol/L — ABNORMAL LOW (ref 135–145)
Total Bilirubin: 0.7 mg/dL (ref 0.3–1.2)
Total Protein: 5.8 g/dL — ABNORMAL LOW (ref 6.5–8.1)

## 2020-09-15 LAB — MAGNESIUM: Magnesium: 1.7 mg/dL (ref 1.7–2.4)

## 2020-09-15 MED ORDER — POTASSIUM CHLORIDE CRYS ER 20 MEQ PO TBCR
40.0000 meq | EXTENDED_RELEASE_TABLET | Freq: Once | ORAL | Status: AC
  Administered 2020-09-15: 40 meq via ORAL
  Filled 2020-09-15: qty 2

## 2020-09-15 MED ORDER — ALPRAZOLAM 0.5 MG PO TABS
0.2500 mg | ORAL_TABLET | Freq: Three times a day (TID) | ORAL | Status: DC
  Administered 2020-09-15: 0.25 mg via ORAL
  Filled 2020-09-15: qty 1

## 2020-09-15 MED ORDER — ENSURE ENLIVE PO LIQD
237.0000 mL | Freq: Two times a day (BID) | ORAL | Status: DC
  Filled 2020-09-15 (×4): qty 237

## 2020-09-15 MED ORDER — POTASSIUM CHLORIDE 20 MEQ PO PACK: 20.0000 meq | PACK | Freq: Two times a day (BID) | ORAL | Status: DC

## 2020-09-15 MED ORDER — INSULIN GLARGINE 100 UNIT/ML ~~LOC~~ SOLN: 10.0000 [IU] | SUBCUTANEOUS | Status: AC

## 2020-09-15 MED ORDER — POTASSIUM CHLORIDE CRYS ER 20 MEQ PO TBCR
20.0000 meq | EXTENDED_RELEASE_TABLET | Freq: Once | ORAL | Status: AC
  Administered 2020-09-15: 20 meq via ORAL
  Filled 2020-09-15: qty 1

## 2020-09-15 MED ORDER — ALPRAZOLAM 1 MG PO TABS: 0.5000 mg | ORAL_TABLET | Freq: Three times a day (TID) | ORAL | Status: AC

## 2020-09-15 MED ORDER — MAGNESIUM SULFATE 4 GM/100ML IV SOLN
4.0000 g | Freq: Once | INTRAVENOUS | Status: AC
  Administered 2020-09-15: 4 g via INTRAVENOUS
  Filled 2020-09-15: qty 100

## 2020-09-15 MED ORDER — PROMETHAZINE HCL 25 MG PO TABS: 12.5000 mg | ORAL_TABLET | Freq: Three times a day (TID) | ORAL | Status: DC | PRN

## 2020-09-15 NOTE — Evaluation (Signed)
Physical Therapy Evaluation Patient Details Name: Jason Woods MRN: [id number] DOB: 08/26/1945 Today's Date: 09/15/2020   History of Present Illness  Jason Woods  is a 75 y.o. male, with history of anxiety, depression, chronic opiate use, HLD, GERD, HTN, DMII, and more presents to the ED with a chief complaint of multiple falls.  Patient's sister apparently brought him into the ED, but patient seems foggy in the details.  At this time he is alert and oriented x3-but remains volume about things earlier in the day.  He reports that he was trying to walk to the bathroom when he fell against a wall and rolled his ankle.  He reports that he is rolled his ankle many times.  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  He reports that it was painful, moderate in intensity.  He does report that he was ambulating without his cane, which he is supposed to use.  He does not think he lost consciousness, or hit his head.  He reports that when he rolled his ankle he leaned against the wall and slid down to the ground.  He reports he has been checking his blood glucose at home 3-4 times a day, and its always in a good range.  He reports his last A1c was 6.8.  He reports that he is otherwise been in his normal state of health.  Apparently family had mentioned that he had been coughing, falling, seemed off balance and somnolent.  Patient is a current smoker and reports that his cough is not changed from his normal.  Unfortunately further details regarding history could not be obtained at this time.    Clinical Impression    Patient sitting up in recliner at start of session and very agreeable to therapy. Able tot perform all bed mobilities and transfers with modified independence. Baseline patient ambulates with cane but secondary to pain and history of falling, educated patient of using his RW for ambulation. Able to ambulate with RW 160 feet with supervision/modified independence. Answered  all questions and patient lives with brother who they trade off taking care of each other. Patient functioning near baseline, no skilled PT a this time and is discharged from physical therapy to care of nursing for ambulation daily as tolerated for length of stay.   Follow Up Recommendations Home health PT;Supervision - Intermittent    Equipment Recommendations  None recommended by PT    Recommendations for Other Services       Precautions / Restrictions Precautions Precautions: None Restrictions Weight Bearing Restrictions: No      Mobility  Bed Mobility Overal bed mobility: Independent                  Transfers Overall transfer level: Independent                  Ambulation/Gait Ambulation/Gait assistance: Modified independent (Device/Increase time) Gait Distance (Feet): 160 Feet Assistive device: Rolling walker (2 wheeled) Gait Pattern/deviations: Decreased stride length Gait velocity: WNL      Stairs            Wheelchair Mobility    Modified Rankin (Stroke Patients Only)       Balance                                             Pertinent Vitals/Pain  Pain Assessment: 0-10 Pain Score: 5  Pain Location: ankles Pain Descriptors / Indicators: Sore Pain Intervention(s): Monitored during session    Home Living Family/patient expects to be discharged to:: Private residence Living Arrangements: Other relatives (brother) Available Help at Discharge: Family Type of Home: House Home Access: Ramped entrance     Home Layout: One level Home Equipment: Environmental consultant - 2 wheels;Cane - single point;Bedside commode;Wheelchair - manual      Prior Function Level of Independence: Independent with assistive device(s) (uses cane PRN)               Hand Dominance        Extremity/Trunk Assessment        Lower Extremity Assessment Lower Extremity Assessment: Generalized weakness       Communication   Communication:  No difficulties  Cognition Arousal/Alertness: Awake/alert                                            General Comments      Exercises General Exercises - Lower Extremity Long Arc Quad: AROM;Strengthening;15 reps;Seated Hip Flexion/Marching: AROM;Both;15 reps;Seated Toe Raises: AROM;Strengthening;Both;15 reps;Seated Heel Raises: AROM;Strengthening;Both;15 reps;Seated   Assessment/Plan    PT Assessment Patent does not need any further PT services  PT Problem List         PT Treatment Interventions      PT Goals (Current goals can be found in the Care Plan section)  Acute Rehab PT Goals Patient Stated Goal: to go home PT Goal Formulation: With patient Time For Goal Achievement: 09/29/20 Potential to Achieve Goals: Good    Frequency     Barriers to discharge        Co-evaluation               AM-PAC PT "6 Clicks" Mobility  Outcome Measure Help needed turning from your back to your side while in a flat bed without using bedrails?: None Help needed moving from lying on your back to sitting on the side of a flat bed without using bedrails?: None Help needed moving to and from a bed to a chair (including a wheelchair)?: None Help needed standing up from a chair using your arms (e.g., wheelchair or bedside chair)?: None Help needed to walk in hospital room?: None Help needed climbing 3-5 steps with a railing? : A Little 6 Click Score: 23    End of Session Equipment Utilized During Treatment: Gait belt Activity Tolerance: Patient tolerated treatment well Patient left: in chair;with call bell/phone within reach Nurse Communication: Mobility status PT Visit Diagnosis: Muscle weakness (generalized) (M62.81);History of falling (Z91.81)    Time: 3810-1751 PT Time Calculation (min) (ACUTE ONLY): 21 min   Charges:   PT Evaluation $PT Eval Low Complexity: 1 Low         11:37 AM, 09/15/20 Jerene Pitch, DPT Physical Therapy with  [facility]  [phone] office

## 2020-09-15 NOTE — TOC Transition Note (Signed)
Transition of Care [facility]) - CM/SW Discharge Note   Patient Details  Name: Jason Woods MRN: [id number] Date of Birth: 1945/10/13  Transition [facility] Care [facility] CM/SW Contact:  Natasha Bence, LCSW Phone Number: 09/15/2020, 12:59 PM   Clinical Narrative:    CSW notified of patient's readiness for discharge and [place] needs. CSW referred patient to Georgina Snell with [facility] for [facility]. Georgina Snell agreeable to provide [facility] services. TOC signing off.    Final next level of care: [place] Barriers to Discharge: Barriers Resolved   Patient Goals and CMS Choice Patient states their goals for this hospitalization and ongoing recovery are:: Return home with [facility] CMS Medicare.gov Compare Post Acute Care list provided to:: Patient Choice offered to / list presented to : Patient  Discharge Placement                    Patient and family notified of of transfer: 09/15/20  Discharge Plan and Services                          HH Arranged: RN, PT [facility] Agency: [place] Date [place]: 09/15/20 Time [place]: 1254 Representative spoke with at [place]: [place] ([place]) Interventions     Readmission Risk Interventions No flowsheet data found.

## 2020-09-15 NOTE — Discharge Instructions (Addendum)
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  Please follow up with orthopedics doctor to have ankles and foot evaluated for chronic fracture on left ankle.      IMPORTANT INFORMATION: PAY CLOSE ATTENTION   PHYSICIAN DISCHARGE INSTRUCTIONS  Follow with Primary care provider  Glenda Chroman, MD  and other consultants as instructed by your Hospitalist Physician  [place] IF SYMPTOMS COME BACK, WORSEN OR NEW PROBLEM DEVELOPS   Please note: You were cared for by a hospitalist during your hospital stay. Every effort will be made to forward records to your primary care provider.  You can request that your primary care provider send for your hospital records if they have not received them.  Once you are discharged, your primary care physician will handle any further medical issues. Please note that NO REFILLS for any discharge medications will be authorized once you are discharged, as it is imperative that you return to your primary care physician (or establish a relationship with a primary care physician if you do not have one) for your post hospital discharge needs so that they can reassess your need for medications and monitor your lab values.  Please get a complete blood count and chemistry panel checked by your Primary MD at your next visit, and again as instructed by your Primary MD.  Get Medicines reviewed and adjusted: Please take all your medications with you for your next visit with your Primary MD  Laboratory/radiological data: Please request your Primary MD to go over all hospital tests and procedure/radiological results at the follow up, please ask your primary care provider to get all Hospital records sent to his/her office.  In some cases, they will be blood work, cultures and biopsy results pending at the time of your discharge. Please request that your primary  care provider follow up on these results.  If you are diabetic, please bring your blood sugar readings with you to your follow up appointment with primary care.    Please call and make your follow up appointments as soon as possible.    Also Note the following: If you experience worsening of your admission symptoms, develop shortness of breath, life threatening emergency, suicidal or homicidal thoughts you must seek medical attention immediately by calling 911 or calling your MD immediately  if symptoms less severe.  You must read complete instructions/literature along with all the possible adverse reactions/side effects for all the Medicines you take and that have been prescribed to you. Take any new Medicines after you have completely understood and accpet all the possible adverse reactions/side effects.   Do not drive when taking Pain medications or sleeping medications (Benzodiazepines)  Do not take more than prescribed Pain, Sleep and Anxiety Medications. It is not advisable to combine anxiety,sleep and pain medications without talking with your primary care practitioner  Special Instructions: If you have smoked or chewed Tobacco  in the last 2 yrs please stop smoking, stop any regular Alcohol  and or any Recreational drug use.  Wear Seat belts while driving.  Do not drive if taking any narcotic, mind altering or controlled substances or recreational drugs or alcohol.

## 2020-09-15 NOTE — ED Notes (Signed)
Pt sitting up in bedside recliner with wheels locked. Pts wife at bedside. Plan of care verbalized to pt and family. Pt voiced understanding. Call light in reach. Will continue to monitor patient.

## 2020-09-15 NOTE — Discharge Summary (Signed)
Physician Discharge Summary  Jason Woods VWU:[id number] DOB: 02/17/1946 DOA: 09/14/2020  PCP: Glenda Chroman, MD  Admit date: 09/14/2020 Discharge date: 09/15/2020  Admitted From:  HOME  Disposition: [place]   Recommendations for Outpatient Follow-up:  Follow up with PCP in 1 weeks regarding polypharmacy and reducing sedating medications Follow up with orthopedics as soon asap regarding chronic left ankle fracture and right ankle pain  Home Health: PT, RN   Discharge Condition: STABLE   CODE STATUS: FULL DIET: heart healthy    Brief Hospitalization Summary: Please see all hospital notes, images, labs for full details of the hospitalization. ADMISSION HPI:   Jason Woods  is a 75 y.o. male, with history of anxiety, depression, chronic opiate use, HLD, GERD, HTN, DMII, and more presents to the ED with a chief complaint of multiple falls.  Patient's sister apparently brought him into the ED, but patient seems foggy in the details.  At this time he is alert and oriented x3-but remains volume about things earlier in the day.  He reports that he was trying to walk to the bathroom when he fell against a wall and rolled his ankle.  He reports that he is rolled his ankle many times.  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  He reports that it was painful, moderate in intensity.  He does report that he was ambulating without his cane, which he is supposed to use.  He does not think he lost consciousness, or hit his head.  He reports that when he rolled his ankle he leaned against the wall and slid down to the ground.  He reports he has been checking his blood glucose at home 3-4 times a day, and its always in a good range.  He reports his last A1c was 6.8.  He reports that he is otherwise been in his normal state of health.  Apparently family had mentioned that he had been coughing, falling, seemed off balance and somnolent.  Patient is a current smoker and  reports that his cough is not changed from his normal.  Unfortunately further details regarding history could not be obtained at this time.   Patient smokes a pack per day, declines nicotine patch.  He does not drink alcohol or use illicit drugs.  He is vaccinated for COVID.  He is full code.   In the ED Temp 98.5, heart rate 63-130, respiratory rate 12-21, blood pressure 123/102, satting at 95% UDS is positive for benzos for which she has a prescription Blood cultures pending Left ankle x-ray shows redemonstrated chronic fracture deformity of the distal left tibia and fibula with fracture segment nail present in  subtalar fusion osteotomy. Chest x-ray is without acute abnormality Patient does have a leukocytosis of 14.6, hemoglobin 11.1 Is slightly hypokalemic at 3.4, he has an AKI with creatinine of 1.5 Negative respiratory panel CT head is without acute intracranial pathology EKG shows a heart rate of 69, sinus rhythm, QTC 456  HOSPITAL COURSE BY PROBLEM LIST  Pt was admitted with acute metabolic encephalopathy secondary to polypharmacy as he is taking multiple sedating medications including Subutex alprazolam, gabapentin, other opioids.  He was taken off the gabapentin and Xanax were reduced and other sedative medications were limited.  He was given IV fluids for hydration and monitored under observation in telemetry.  He had an acute kidney injury that was thought secondary to dehydration and ARB and diuretic HCTZ that he was taking.  This was held.  The ARB losartan was held.  Patient was given gentle hydration and creatinine has improved.  He had a reactive leukocytosis that has resolved and WBC has normalized today.  He had mild hypokalemia that was treated with supplemental potassium.  Patient had x-rays of both ankles and feet and was noted to have a chronic fracture in the left ankle.  He says that he has an appointment to see a foot doctor I have asked him to follow-up with his  orthopedic doctor soon as possible for recheck.  Also he was seen by physical therapy and they have recommended home health PT.  He also has home health RN ordered as well.  He is stable to discharge home and have outpatient follow-up as recommended above.  I expressed to him the importance that he follow-up with orthopedic regarding his chronic left ankle fracture.  He has verbalized understanding.  Discharge Diagnoses:  Active Problems:   GERD (gastroesophageal reflux disease)   Diabetes ([place])   Acute metabolic encephalopathy   AKI (acute kidney injury) ([place])   Leukocytosis   Hypokalemia   Moderate protein-calorie malnutrition ([place])  Discharge Instructions: Discharge Instructions     AMB referral to orthopedics   Complete by: As directed    Hospital follow up chronic left ankle fracture      Allergies as of 09/15/2020       Reactions   Lipitor [atorvastatin] Other (See Comments)   Aching, MS   Zocor [simvastatin-high Dose] Other (See Comments)   MS aches   Celexa [citalopram Hydrobromide] Other (See Comments)   Nausea,blurred vision   Crestor [rosuvastatin]    Pain, aching muscles   Mobic [meloxicam] Other (See Comments)   nausea   Prozac [fluoxetine Hcl] Other (See Comments)   fatigue        Medication List     STOP taking these medications    aspirin EC 81 MG tablet   gabapentin 400 MG capsule Commonly known as: NEURONTIN   hydrochlorothiazide 25 MG tablet Commonly known as: HYDRODIURIL   HYDROcodone-acetaminophen 5-325 MG tablet Commonly known as: NORCO/VICODIN   hydrOXYzine 25 MG tablet Commonly known as: ATARAX/VISTARIL   losartan 25 MG tablet Commonly known as: COZAAR   oxyCODONE 5 MG immediate release tablet Commonly known as: Roxicodone       TAKE these medications    ALPRAZolam 1 MG tablet Commonly known as: XANAX Take 0.5 tablets (0.5 mg total) by mouth 3 (three) times daily. What changed: how much to take   b complex vitamins  tablet Take 1 tablet by mouth daily. With C   bimatoprost 0.01 % Soln Commonly known as: LUMIGAN Place 1 drop into the right eye at bedtime.   buprenorphine 8 MG Subl SL tablet Commonly known as: SUBUTEX Place 8 mg under the tongue 2 (two) times daily.   Cholecalciferol 50 MCG (2000 UT) Tabs Take 2,000 Units by mouth daily.   cyanocobalamin 2000 MCG tablet Take 2,000 mcg by mouth daily.   docusate sodium 100 MG capsule Commonly known as: Colace Take 1 capsule (100 mg total) by mouth 2 (two) times daily. While taking narcotic pain medicine.   dorzolamide 2 % ophthalmic solution Commonly known as: TRUSOPT Place 1 drop into both eyes 3 (three) times daily.   DULoxetine 30 MG capsule Commonly known as: CYMBALTA Take 30 mg by mouth daily.   fluticasone 50 MCG/ACT nasal spray Commonly known as: FLONASE Place 2 sprays into both nostrils daily.   insulin  glargine 100 UNIT/ML injection Commonly known as: LANTUS Inject 0.1-0.18 mLs (10-18 Units total) into the skin See admin instructions. Per sliding  16 units in the morning and 8-10 units in the evening   ketoconazole 2 % cream Commonly known as: NIZORAL Apply 1 application topically daily.   Linzess 145 MCG Caps capsule Generic drug: linaclotide Take 145 mcg by mouth daily before breakfast.   metFORMIN 850 MG tablet Commonly known as: GLUCOPHAGE Take 850 mg by mouth 2 (two) times daily with a meal.   metoprolol succinate 25 MG 24 hr tablet Commonly known as: TOPROL-XL Take 25 mg by mouth daily.   multivitamin with minerals Tabs tablet Take 1 tablet by mouth daily.   NovoLOG FlexPen 100 UNIT/ML FlexPen Generic drug: insulin aspart Novolog Flexpen U-100 Insulin aspart 100 unit/mL (3 mL) subcutaneous  INJECT INTO THE SKIN BEFORE MEALS PER SLIDING SCALE. MAX DOSE 30 UNITS PER DAY   pantoprazole 40 MG tablet Commonly known as: PROTONIX Take 40 mg by mouth daily.   promethazine 25 MG tablet Commonly known as:  PHENERGAN Take 0.5 tablets (12.5 mg total) by mouth 3 (three) times daily as needed. What changed: how much to take   senna 8.6 MG Tabs tablet Commonly known as: SENOKOT Take 2 tablets (17.2 mg total) by mouth 2 (two) times daily.   triamcinolone cream 0.1 % Commonly known as: KENALOG Apply 1 application topically daily as needed (affected area(s) on skin).        Follow-up Information     Vyas, Dhruv B, MD. Schedule an appointment as soon as possible for a visit in 3 day(s).   Specialty: Internal Medicine Why: Hospital Follow Up Contact information: [place] [address] [phone]         Mordecai Rasmussen, MD. Schedule an appointment as soon as possible for a visit in 3 day(s).   Specialties: Orthopedic Surgery, Sports Medicine Why: Hospital Follow Up for ankle and foot pain Contact information: [address] [place] [address] [phone]                Allergies  Allergen Reactions   Lipitor [Atorvastatin] Other (See Comments)    Aching, MS   Zocor [Simvastatin-High Dose] Other (See Comments)    MS aches   Celexa [Citalopram Hydrobromide] Other (See Comments)    Nausea,blurred vision   Crestor [Rosuvastatin]     Pain, aching muscles   Mobic [Meloxicam] Other (See Comments)    nausea   Prozac [Fluoxetine Hcl] Other (See Comments)    fatigue   Allergies as of 09/15/2020       Reactions   Lipitor [atorvastatin] Other (See Comments)   Aching, MS   Zocor [simvastatin-high Dose] Other (See Comments)   MS aches   Celexa [citalopram Hydrobromide] Other (See Comments)   Nausea,blurred vision   Crestor [rosuvastatin]    Pain, aching muscles   Mobic [meloxicam] Other (See Comments)   nausea   Prozac [fluoxetine Hcl] Other (See Comments)   fatigue        Medication List     STOP taking these medications    aspirin EC 81 MG tablet   gabapentin 400 MG capsule Commonly known as: NEURONTIN   hydrochlorothiazide 25 MG  tablet Commonly known as: HYDRODIURIL   HYDROcodone-acetaminophen 5-325 MG tablet Commonly known as: NORCO/VICODIN   hydrOXYzine 25 MG tablet Commonly known as: ATARAX/VISTARIL   losartan 25 MG tablet Commonly known as: COZAAR   oxyCODONE 5 MG  immediate release tablet Commonly known as: Roxicodone       TAKE these medications    ALPRAZolam 1 MG tablet Commonly known as: XANAX Take 0.5 tablets (0.5 mg total) by mouth 3 (three) times daily. What changed: how much to take   b complex vitamins tablet Take 1 tablet by mouth daily. With C   bimatoprost 0.01 % Soln Commonly known as: LUMIGAN Place 1 drop into the right eye at bedtime.   buprenorphine 8 MG Subl SL tablet Commonly known as: SUBUTEX Place 8 mg under the tongue 2 (two) times daily.   Cholecalciferol 50 MCG (2000 UT) Tabs Take 2,000 Units by mouth daily.   cyanocobalamin 2000 MCG tablet Take 2,000 mcg by mouth daily.   docusate sodium 100 MG capsule Commonly known as: Colace Take 1 capsule (100 mg total) by mouth 2 (two) times daily. While taking narcotic pain medicine.   dorzolamide 2 % ophthalmic solution Commonly known as: TRUSOPT Place 1 drop into both eyes 3 (three) times daily.   DULoxetine 30 MG capsule Commonly known as: CYMBALTA Take 30 mg by mouth daily.   fluticasone 50 MCG/ACT nasal spray Commonly known as: FLONASE Place 2 sprays into both nostrils daily.   insulin glargine 100 UNIT/ML injection Commonly known as: LANTUS Inject 0.1-0.18 mLs (10-18 Units total) into the skin See admin instructions. Per sliding  16 units in the morning and 8-10 units in the evening   ketoconazole 2 % cream Commonly known as: NIZORAL Apply 1 application topically daily.   Linzess 145 MCG Caps capsule Generic drug: linaclotide Take 145 mcg by mouth daily before breakfast.   metFORMIN 850 MG tablet Commonly known as: GLUCOPHAGE Take 850 mg by mouth 2 (two) times daily with a meal.   metoprolol  succinate 25 MG 24 hr tablet Commonly known as: TOPROL-XL Take 25 mg by mouth daily.   multivitamin with minerals Tabs tablet Take 1 tablet by mouth daily.   NovoLOG FlexPen 100 UNIT/ML FlexPen Generic drug: insulin aspart Novolog Flexpen U-100 Insulin aspart 100 unit/mL (3 mL) subcutaneous  INJECT INTO THE SKIN BEFORE MEALS PER SLIDING SCALE. MAX DOSE 30 UNITS PER DAY   pantoprazole 40 MG tablet Commonly known as: PROTONIX Take 40 mg by mouth daily.   promethazine 25 MG tablet Commonly known as: PHENERGAN Take 0.5 tablets (12.5 mg total) by mouth 3 (three) times daily as needed. What changed: how much to take   senna 8.6 MG Tabs tablet Commonly known as: SENOKOT Take 2 tablets (17.2 mg total) by mouth 2 (two) times daily.   triamcinolone cream 0.1 % Commonly known as: KENALOG Apply 1 application topically daily as needed (affected area(s) on skin).        Procedures/Studies: DG Ankle 2 Views Left  Result Date: 09/14/2020 CLINICAL DATA:  Chronic left ankle pain EXAM: LEFT ANKLE - 2 VIEW COMPARISON:  Same day radiographs of the left foot, CT left lower extremity, 10/24/2018 FINDINGS: Redemonstrated chronic fracture deformity of the distal left tibia and fibula, with a fractured cement nail present in a subtalar fusion osteotomy. No acute fracture. There is severe arthrosis of the ankle mortise. Vascular calcinosis. IMPRESSION: 1. Redemonstrated chronic fracture deformity of the distal left tibia and fibula, with a fractured cement nail present in a subtalar fusion osteotomy. 2. Severe arthrosis of the ankle mortise. Electronically Signed   By: Eddie Candle M.D.   On: 09/14/2020 20:19   CT Head Wo Contrast  Result Date: 09/14/2020 CLINICAL DATA:  Head  trauma.  Low blood pressure. EXAM: CT HEAD WITHOUT CONTRAST TECHNIQUE: Contiguous axial images were obtained from the base of the skull through the vertex without intravenous contrast. COMPARISON:  None FINDINGS: Brain: No evidence  of acute infarction, hemorrhage, hydrocephalus, extra-axial collection or mass lesion/mass effect. Signs of atrophy and of chronic microvascular ischemic change Vascular: No hyperdense vessel or unexpected calcification. Skull: Signs of previous RIGHT mastoidectomy. No acute or destructive process related to the calvarium Sinuses/Orbits: No acute finding. Other: None IMPRESSION: 1. No acute intracranial pathology. 2. Signs of atrophy and chronic microvascular ischemic change. 3. Signs of previous RIGHT mastoidectomy. Electronically Signed   By: Zetta Bills M.D.   On: 09/14/2020 13:52   DG Chest Portable 1 View  Result Date: 09/14/2020 CLINICAL DATA:  Cough EXAM: PORTABLE CHEST 1 VIEW COMPARISON:  10/06/2017 FINDINGS: Cardiomegaly. Both lungs are clear. The visualized skeletal structures are unremarkable. IMPRESSION: Cardiomegaly without acute abnormality of the lungs in AP portable projection. Electronically Signed   By: Eddie Candle M.D.   On: 09/14/2020 20:17   DG Foot Complete Left  Result Date: 09/14/2020 CLINICAL DATA:  Foot pain, several falls within the last couple of days. Bilateral foot pain and soreness. EXAM: LEFT FOOT - COMPLETE 3+ VIEW COMPARISON:  CT of the LEFT ankle from August of 2020. Plain film of the LEFT ankle from July of 2016. FINDINGS: Osteopenia. Degenerative changes throughout the midfoot. These changes are marked and limited assessment. Fracture of an implant extending from the tibia through the calcaneus related to bony fusion about the LEFT ankle. Since previous CT imaging the intramedullary device that was secured with locking screws has been replaced by this device. Marked degenerative process in tibia talar joint. No signs of fracture about the midfoot or forefoot. IMPRESSION: Fracture of a cement nail with guidewire extending from the medullary canal of the tibia into the subtalar joint and through the calcaneus at the site of previous hardware. Given this appearance  dedicated ankle imaging may be helpful for further assessment as warranted. Marked degenerative changes without signs of acute fracture. Electronically Signed   By: Zetta Bills M.D.   On: 09/14/2020 17:18   DG Foot Complete Right  Result Date: 09/14/2020 CLINICAL DATA:  Foot pain, falls. EXAM: RIGHT FOOT COMPLETE - 3+ VIEW COMPARISON:  None FINDINGS: Degenerative changes about the ankle and midfoot. Osteopenia. Signs of vascular calcifications soft tissues. No sign of acute fracture or dislocation. Plantar spurring along the plantar surface shin of the calcaneus. IMPRESSION: 1. No sign of acute fracture or dislocation. 2. Osteopenia and degenerative changes. 3. Irregularity of the lateral malleolus is noted on AP view. The patient reportedly has a history of a minimally displaced fracture in this location based on reports from May of 2022 from [facility]. Correlate with any worsening pain in this area. Electronically Signed   By: Zetta Bills M.D.   On: 09/14/2020 17:22     Subjective: Pt says he is feeling better  He has pain in left ankle.  He says he has follow up already arranged with his foot doctor regarding his foot issues.    Discharge Exam: Vitals:   09/15/20 0616 09/15/20 0923  BP: 118/67 (!) 142/55  Pulse: 62 77  Resp: 16 17  Temp: 98 F (36.7 C)   SpO2: 95% 96%   Vitals:   09/15/20 0300 09/15/20 0315 09/15/20 0616 09/15/20 0923  BP: (!) 116/51 (!) 113/50 118/67 (!) 142/55  Pulse: 65 65 62 77  Resp:  17 16 17   Temp:   98 F (36.7 C)   TempSrc:   Oral   SpO2: 92% 93% 95% 96%  Weight:      Height:       General: Pt is alert, awake, not in acute distress, sitting up and eating breakfast, oriented x 3, alert.  Cardiovascular: normal S1/S2 +, no rubs, no gallops Respiratory: CTA bilaterally, no wheezing, no rhonchi Abdominal: Soft, NT, ND, bowel sounds + Extremities: no edema, no cyanosis, mild inflammation seen in left ankle joint and defomities from prior surgery  seen.  He has been ambulating without difficulty.  Pedal pulses good in both feet and they are warm.    The results of significant diagnostics from this hospitalization (including imaging, microbiology, ancillary and laboratory) are listed below for reference.     Microbiology: Recent Results (from the past 240 hour(s))  Resp Panel by RT-PCR (Flu A&B, Covid) Nasopharyngeal Swab     Status: None   Collection Time: 09/14/20  1:40 PM   Specimen: Nasopharyngeal Swab; Nasopharyngeal(NP) swabs in vial transport medium  Result Value Ref Range Status   SARS Coronavirus 2 by RT PCR NEGATIVE NEGATIVE Final    Comment: (NOTE) SARS-CoV-2 target nucleic acids are NOT DETECTED.  The SARS-CoV-2 RNA is generally detectable in upper respiratory specimens during the acute phase of infection. The lowest concentration of SARS-CoV-2 viral copies this assay can detect is 138 copies/mL. A negative result does not preclude SARS-Cov-2 infection and should not be used as the sole basis for treatment or other patient management decisions. A negative result may occur with  improper specimen collection/handling, submission of specimen other than nasopharyngeal swab, presence of viral mutation(s) within the areas targeted by this assay, and inadequate number of viral copies(<138 copies/mL). A negative result must be combined with clinical observations, patient history, and epidemiological information. The expected result is Negative.  Fact Sheet for Patients:  EntrepreneurPulse.com.au  Fact Sheet for Healthcare Providers:  IncredibleEmployment.be  This test is no t yet approved or cleared by the [place] FDA and  has been authorized for detection and/or diagnosis of SARS-CoV-2 by FDA under an Emergency Use Authorization (EUA). This EUA will remain  in effect (meaning this test can be used) for the duration of the COVID-19 declaration under Section 564(b)(1) of the Act,  21 U.S.C.section 360bbb-3(b)(1), unless the authorization is terminated  or revoked sooner.       Influenza A by PCR NEGATIVE NEGATIVE Final   Influenza B by PCR NEGATIVE NEGATIVE Final    Comment: (NOTE) The Xpert Xpress SARS-CoV-2/FLU/RSV plus assay is intended as an aid in the diagnosis of influenza from Nasopharyngeal swab specimens and should not be used as a sole basis for treatment. Nasal washings and aspirates are unacceptable for Xpert Xpress SARS-CoV-2/FLU/RSV testing.  Fact Sheet for Patients: EntrepreneurPulse.com.au  Fact Sheet for Healthcare Providers: IncredibleEmployment.be  This test is not yet approved or cleared by the [place] FDA and has been authorized for detection and/or diagnosis of SARS-CoV-2 by FDA under an Emergency Use Authorization (EUA). This EUA will remain in effect (meaning this test can be used) for the duration of the COVID-19 declaration under Section 564(b)(1) of the Act, 21 U.S.C. section 360bbb-3(b)(1), unless the authorization is terminated or revoked.  Performed at [facility], [address], [place], [place] [address]   Blood culture (routine x 2)     Status: None (Preliminary result)   Collection Time: 09/14/20  5:29 PM  Specimen: BLOOD RIGHT HAND  Result Value Ref Range Status   Specimen Description BLOOD RIGHT HAND  Final   Special Requests   Final    BOTTLES DRAWN AEROBIC AND ANAEROBIC Blood Culture adequate volume Performed at [facility], [address] [place], [place] [address]    Culture PENDING  Incomplete   Report Status PENDING  Incomplete  Blood culture (routine x 2)     Status: None (Preliminary result)   Collection Time: 09/14/20  5:29 PM   Specimen: BLOOD LEFT ARM  Result Value Ref Range Status   Specimen Description BLOOD LEFT ARM  Final   Special Requests   Final    BOTTLES DRAWN AEROBIC AND ANAEROBIC Blood Culture adequate volume Performed at [facility],  [address], [place], [place] [address]    Culture PENDING  Incomplete   Report Status PENDING  Incomplete     Labs: BNP (last 3 results) No results for input(s): BNP in the last 8760 hours. Basic Metabolic Panel: Recent Labs  Lab 09/14/20 1349 09/15/20 0549  NA 134* 134*  K 3.4* 3.2*  CL 98 98  CO2 27 29  GLUCOSE 140* 116*  BUN 17 16  CREATININE 1.51* 1.29*  CALCIUM 7.8* 7.9*  MG  --  1.7   Liver Function Tests: Recent Labs  Lab 09/14/20 1349 09/15/20 0549  AST 18 17  ALT 11 12  ALKPHOS 77 80  BILITOT 0.7 0.7  PROT 5.2* 5.8*  ALBUMIN 2.9* 3.1*   Recent Labs  Lab 09/14/20 1349  LIPASE 27   No results for input(s): AMMONIA in the last 168 hours. CBC: Recent Labs  Lab 09/14/20 1349 09/15/20 0549  WBC 14.6* 10.2  NEUTROABS 12.8* 8.3*  HGB 11.1* 11.5*  HCT 33.5* 34.2*  MCV 91.5 91.9  PLT 195 189   Cardiac Enzymes: No results for input(s): CKTOTAL, CKMB, CKMBINDEX, TROPONINI in the last 168 hours. BNP: Invalid input(s): POCBNP CBG: Recent Labs  Lab 09/14/20 2204 09/15/20 0740  GLUCAP 114* 112*   D-Dimer No results for input(s): DDIMER in the last 72 hours. Hgb A1c No results for input(s): HGBA1C in the last 72 hours. Lipid Profile No results for input(s): CHOL, HDL, LDLCALC, TRIG, CHOLHDL, LDLDIRECT in the last 72 hours. Thyroid function studies No results for input(s): TSH, T4TOTAL, T3FREE, THYROIDAB in the last 72 hours.  Invalid input(s): FREET3 Anemia work up No results for input(s): VITAMINB12, FOLATE, FERRITIN, TIBC, IRON, RETICCTPCT in the last 72 hours. Urinalysis    Component Value Date/Time   COLORURINE YELLOW 09/14/2020 [place] (A) 09/14/2020 1835   LABSPEC 1.015 09/14/2020 1835   PHURINE 5.5 09/14/2020 1835   GLUCOSEU NEGATIVE 09/14/2020 1835   HGBUR NEGATIVE 09/14/2020 1835   BILIRUBINUR NEGATIVE 09/14/2020 1835   KETONESUR NEGATIVE 09/14/2020 [place] NEGATIVE 09/14/2020 1835   NITRITE NEGATIVE  09/14/2020 1835   LEUKOCYTESUR NEGATIVE 09/14/2020 1835   Sepsis Labs Invalid input(s): PROCALCITONIN,  WBC,  LACTICIDVEN Microbiology Recent Results (from the past 240 hour(s))  Resp Panel by RT-PCR (Flu A&B, Covid) Nasopharyngeal Swab     Status: None   Collection Time: 09/14/20  1:40 PM   Specimen: Nasopharyngeal Swab; Nasopharyngeal(NP) swabs in vial transport medium  Result Value Ref Range Status   SARS Coronavirus 2 by RT PCR NEGATIVE NEGATIVE Final    Comment: (NOTE) SARS-CoV-2 target nucleic acids are NOT DETECTED.  The SARS-CoV-2 RNA is generally detectable in upper respiratory specimens during the acute phase of  infection. The lowest concentration of SARS-CoV-2 viral copies this assay can detect is 138 copies/mL. A negative result does not preclude SARS-Cov-2 infection and should not be used as the sole basis for treatment or other patient management decisions. A negative result may occur with  improper specimen collection/handling, submission of specimen other than nasopharyngeal swab, presence of viral mutation(s) within the areas targeted by this assay, and inadequate number of viral copies(<138 copies/mL). A negative result must be combined with clinical observations, patient history, and epidemiological information. The expected result is Negative.  Fact Sheet for Patients:  EntrepreneurPulse.com.au  Fact Sheet for Healthcare Providers:  IncredibleEmployment.be  This test is no t yet approved or cleared by the [place] FDA and  has been authorized for detection and/or diagnosis of SARS-CoV-2 by FDA under an Emergency Use Authorization (EUA). This EUA will remain  in effect (meaning this test can be used) for the duration of the COVID-19 declaration under Section 564(b)(1) of the Act, 21 U.S.C.section 360bbb-3(b)(1), unless the authorization is terminated  or revoked sooner.       Influenza A by PCR NEGATIVE NEGATIVE  Final   Influenza B by PCR NEGATIVE NEGATIVE Final    Comment: (NOTE) The Xpert Xpress SARS-CoV-2/FLU/RSV plus assay is intended as an aid in the diagnosis of influenza from Nasopharyngeal swab specimens and should not be used as a sole basis for treatment. Nasal washings and aspirates are unacceptable for Xpert Xpress SARS-CoV-2/FLU/RSV testing.  Fact Sheet for Patients: EntrepreneurPulse.com.au  Fact Sheet for Healthcare Providers: IncredibleEmployment.be  This test is not yet approved or cleared by the [place] FDA and has been authorized for detection and/or diagnosis of SARS-CoV-2 by FDA under an Emergency Use Authorization (EUA). This EUA will remain in effect (meaning this test can be used) for the duration of the COVID-19 declaration under Section 564(b)(1) of the Act, 21 U.S.C. section 360bbb-3(b)(1), unless the authorization is terminated or revoked.  Performed at [facility], [address] [place], [place] [address]   Blood culture (routine x 2)     Status: None (Preliminary result)   Collection Time: 09/14/20  5:29 PM   Specimen: BLOOD RIGHT HAND  Result Value Ref Range Status   Specimen Description BLOOD RIGHT HAND  Final   Special Requests   Final    BOTTLES DRAWN AEROBIC AND ANAEROBIC Blood Culture adequate volume Performed at [facility], [address], [place], [place] [address]    Culture PENDING  Incomplete   Report Status PENDING  Incomplete  Blood culture (routine x 2)     Status: None (Preliminary result)   Collection Time: 09/14/20  5:29 PM   Specimen: BLOOD LEFT ARM  Result Value Ref Range Status   Specimen Description BLOOD LEFT ARM  Final   Special Requests   Final    BOTTLES DRAWN AEROBIC AND ANAEROBIC Blood Culture adequate volume Performed at [facility], [address], [place], [place] [address]    Culture PENDING  Incomplete   Report Status PENDING  Incomplete   Time coordinating discharge:    SIGNED:  Irwin Brakeman, MD  [facility] 09/15/2020, 12:00 PM How to contact the [facility] Attending or Consulting provider [place] or covering provider during after hours [place], for this patient?  Check the care team in [facility] and look for a) attending/consulting [facility] provider listed and b) the [facility] team listed Log into [email or web address] and use 's universal password to access. If you do not have the password, please  contact the hospital operator. Locate the [facility] provider you are looking for under [facility] and page to a number that you can be directly reached. If you still have difficulty reaching the provider, please page the [facility] (Director on Call) for the Hospitalists listed on amion for assistance.

## 2020-09-15 NOTE — ED Notes (Signed)
Pt A&O x4, able to ambulate with steady gate at this time.

## 2020-09-15 NOTE — H&P (Signed)
TRH H&P    Patient Demographics:    Jason Woods, is a 75 y.o. male  MRN: [id number]  DOB - [birth year]  Admit Date - 09/14/2020  Referring MD/NP/PA: Rolla Plate  Outpatient Primary MD for the patient is Glenda Chroman, MD  Patient coming from: Home  Chief complaint- multiple falls   HPI:    Jason Woods  is a 75 y.o. male, with history of anxiety, depression, chronic opiate use, HLD, GERD, HTN, DMII, and more presents to the ED with a chief complaint of multiple falls.  Patient's sister apparently brought him into the ED, but patient seems foggy in the details.  At this time he is alert and oriented x3-but remains volume about things earlier in the day.  He reports that he was trying to walk to the bathroom when he fell against a wall and rolled his ankle.  He reports that he is rolled his ankle many times.  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  He reports that it was painful, moderate in intensity.  He does report that he was ambulating without his cane, which he is supposed to use.  He does not think he lost consciousness, or hit his head.  He reports that when he rolled his ankle he leaned against the wall and slid down to the ground.  He reports he has been checking his blood glucose at home 3-4 times a day, and its always in a good range.  He reports his last A1c was 6.8.  He reports that he is otherwise been in his normal state of health.  Apparently family had mentioned that he had been coughing, falling, seemed off balance and somnolent.  Patient is a current smoker and reports that his cough is not changed from his normal.  Unfortunately further details regarding history could not be obtained at this time.  Patient smokes a pack per day, declines nicotine patch.  He does not drink alcohol or use illicit drugs.  He is vaccinated for COVID.  He is full code.  In the ED Temp 98.5,  heart rate 63-130, respiratory rate 12-21, blood pressure 123/102, satting at 95% UDS is positive for benzos for which she has a prescription Blood cultures pending Left ankle x-ray shows redemonstrated chronic fracture deformity of the distal left tibia and fibula with fracture segment nail present in  subtalar fusion osteotomy. Chest x-ray is without acute abnormality Patient does have a leukocytosis of 14.6, hemoglobin 11.1 Is slightly hypokalemic at 3.4, he has an AKI with creatinine of 1.5 Negative respiratory panel CT head is without acute intracranial pathology EKG shows a heart rate of 69, sinus rhythm, QTC 456    Review of systems:    In addition to the HPI above,  No Fever-chills, No Headache, No changes with Vision or hearing, No problems swallowing food or Liquids, No Chest pain, chronic cough, admits to shortness of Breath, No Abdominal pain, No Nausea or Vomiting, bowel movements are regular, No Blood in stool or Urine, No dysuria,  No new skin rashes or bruises, No new joints pains-aches,  No new weakness, tingling, numbness in any extremity, No recent weight gain or loss, No polyuria, polydypsia or polyphagia, No significant Mental Stressors.  All other systems reviewed and are negative.    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    Depression    GERD (gastroesophageal reflux disease)    High cholesterol    History of kidney stones    Hypertension    Legally blind    Skin cancer    "frozen off my face, arms" (01/07/2018)   Type II diabetes mellitus ([place])    Vitamin B deficiency 03/09/2017      Past Surgical History:  Procedure Laterality Date   ANKLE FUSION Left 01/12/2018    Procedure: LEFT ANKLE REMOVAL OF EXTERNAL FIXATOR, ANKLE FUSION WITH PHOENIX NAIL;  Surgeon: [name], MD;  Location: [place];  Service: Orthopedics;  Laterality: Left;   ANKLE HARDWARE REMOVAL Left 01/07/2018   REMOVAL OF HARDWARE, TAKE DOWN OF NONUNION, INSERTION OF ANTIBIOTC BEADS/notes 01/07/2018   BIOPSY  04/23/2017   Procedure: BIOPSY;  Surgeon: Rogene Houston, MD;  [facility];  Service: Endoscopy;;  gastric   CATARACT EXTRACTION W/ INTRAOCULAR LENS  IMPLANT, BILATERAL Bilateral    ESOPHAGEAL DILATION N/A 04/23/2017   Procedure: ESOPHAGEAL DILATION;  Surgeon: Rogene Houston, MD;  [facility];  Service: Endoscopy;  Laterality: N/A;   ESOPHAGOGASTRODUODENOSCOPY N/A 04/23/2017   Procedure: ESOPHAGOGASTRODUODENOSCOPY (EGD);  Surgeon: Rogene Houston, MD;  [facility];  Service: Endoscopy;  Laterality: N/A;  9:55-moved to 1040 per Ann   EXTERNAL FIXATION LEG Left 01/07/2018   EXTERNAL FIXATION LEG, TIBIA TO CALCANEUS/notes 01/07/2018   EXTERNAL FIXATION LEG Left 01/07/2018   Procedure: EXTERNAL FIXATION LEG, TIBIA TO CALCANEUS;  Surgeon: [name], MD;  Location: [place];  Service: Orthopedics;  Laterality: Left;   EXTERNAL FIXATION REMOVAL Left 01/12/2018   Procedure: REMOVAL EXTERNAL FIXATION ANKLE;  Surgeon: [name], MD;  Location: [place];  Service: Orthopedics;  Laterality: Left;   FRACTURE SURGERY     HARDWARE REMOVAL Left 01/07/2018   Procedure: LEFT ANKLE REMOVAL OF HARDWARE, TAKE DOWN OF NONUNION, INSERTION OF ANTIBIOTC BEADS;  Surgeon: [name], MD;  Location: [address];  Service: Orthopedics;  Laterality: Left;   HARDWARE REMOVAL Left 12/01/2018   Procedure: Left calcaneus, talus, and tibia removal of deep implants; irrigation and excisional debridement;  Surgeon: Wylene Simmer, MD;  Location: [place];  Service: Orthopedics;  Laterality: Left;   I & D EXTREMITY Left 10/05/2017   Procedure: IRRIGATION AND DEBRIDEMENT QPEN ANKLE FRACTURE;   Surgeon: [name], MD;  Location: [place];  Service: Orthopedics;  Laterality: Left;   INNER EAR SURGERY Right 1962   "scraped the bone; couldn't hear out of it; was infected"   MULTIPLE TOOTH EXTRACTIONS  2018   ORIF ANKLE FRACTURE Left 10/05/2017   Procedure: OPEN REDUCTION INTERNAL FIXATION TRIMALLEOLAR (ORIF)ANKLE FRACTURE;  Surgeon: [name], MD;  Location: [place];  Service: Orthopedics;  Laterality: Left;   WRIST FRACTURE SURGERY Left 2018  Social History:      Social History   Tobacco Use   Smoking status: Every Day    Packs/day: 1.00    Years: 62.00    Pack years: 62.00    Types: Cigarettes   Smokeless tobacco: Never  Substance Use Topics   Alcohol use: Not Currently    Comment:  "nothing since the 1990s"       Family History :     Family History  Problem Relation Age of Onset   Diabetes Mother    Heart attack Father    Diabetes Sister    Diabetes Brother       Home Medications:   Prior to Admission medications   Medication Sig Start Date End Date Taking? Authorizing Provider  ALPRAZolam Duanne Moron) 1 MG tablet Take 1 mg by mouth 3 (three) times daily.    Yes [provider]  ketoconazole (NIZORAL) 2 % cream Apply 1 application topically daily.   Yes [provider]  aspirin EC 81 MG tablet Take 1 tablet (81 mg total) by mouth 2 (two) times daily. Patient not taking: No sig reported 12/06/18   Corky Sing, PA-C  b complex vitamins tablet Take 1 tablet by mouth daily. With C    [provider]  bimatoprost (LUMIGAN) 0.01 % SOLN Place 1 drop into the right eye at bedtime.    [provider]  buprenorphine (SUBUTEX) 8 MG SUBL SL tablet Place 8 mg under the tongue 2 (two) times daily.  09/29/17   [provider]  Cholecalciferol 50 MCG (2000 UT) TABS Take 2,000 Units by mouth daily.    [provider]  cyanocobalamin 2000 MCG tablet Take 2,000 mcg by mouth daily.    [provider]   docusate sodium (COLACE) 100 MG capsule Take 1 capsule (100 mg total) by mouth 2 (two) times daily. While taking narcotic pain medicine. 12/06/18   Corky Sing, PA-C  dorzolamide (TRUSOPT) 2 % ophthalmic solution Place 1 drop into both eyes 3 (three) times daily.  04/15/17   [provider]  DULoxetine (CYMBALTA) 30 MG capsule Take 30 mg by mouth daily.  10/11/17   [provider]  fluticasone (FLONASE) 50 MCG/ACT nasal spray Place 2 sprays into both nostrils daily.     [provider]  gabapentin (NEURONTIN) 400 MG capsule Take 400 mg by mouth 2 (two) times daily.     [provider]  hydrochlorothiazide (HYDRODIURIL) 25 MG tablet Take 25 mg by mouth daily. 10/11/17   [provider]  HYDROcodone-acetaminophen (NORCO/VICODIN) 5-325 MG tablet hydrocodone 5 mg-acetaminophen 325 mg tablet  Take 1 tablet every 4 hours by oral route as needed.    [provider]  hydrOXYzine (ATARAX/VISTARIL) 25 MG tablet Take 25 mg by mouth at bedtime. 06/28/18   [provider]  insulin aspart (NOVOLOG FLEXPEN) 100 UNIT/ML FlexPen Novolog Flexpen U-100 Insulin aspart 100 unit/mL (3 mL) subcutaneous  INJECT INTO THE SKIN BEFORE MEALS PER SLIDING SCALE. MAX DOSE 30 UNITS PER DAY    [provider]  insulin glargine (LANTUS) 100 UNIT/ML injection Inject 0.1 mLs (10 Units total) into the skin at bedtime. Patient taking differently: Inject 10-18 Units into the skin See admin instructions. Per sliding  16 units in the morning and 8-10 units in the evening 10/08/17   Velna Ochs, MD  linaclotide [facility]) 145 MCG CAPS capsule Take 145 mcg by mouth daily before breakfast.    [provider]  losartan (COZAAR)  25 MG tablet Take 1 tablet (25 mg total) by mouth daily. 10/08/17   Velna Ochs, MD  metFORMIN (GLUCOPHAGE) 850 MG tablet Take 850 mg by mouth 2 (two) times daily with a meal.    [provider]  metoprolol succinate  (TOPROL-XL) 25 MG 24 hr tablet Take 25 mg by mouth daily. 06/28/18   [provider]  Multiple Vitamin (MULTIVITAMIN WITH MINERALS) TABS tablet Take 1 tablet by mouth daily.    [provider]  oxyCODONE (ROXICODONE) 5 MG immediate release tablet Take 1 tablet (5 mg total) by mouth every 4 (four) hours as needed for moderate pain or severe pain. 12/06/18   Corky Sing, PA-C  pantoprazole (PROTONIX) 40 MG tablet Take 40 mg by mouth daily.    [provider]  promethazine (PHENERGAN) 25 MG tablet Take 25 mg by mouth 3 (three) times daily as needed. 11/22/18   [provider]  senna (SENOKOT) 8.6 MG TABS tablet Take 2 tablets (17.2 mg total) by mouth 2 (two) times daily. 12/06/18   Corky Sing, PA-C  triamcinolone cream (KENALOG) 0.1 % Apply 1 application topically daily as needed (affected area(s) on skin).  04/15/17   [provider]     Allergies:     Allergies  Allergen Reactions   Lipitor [Atorvastatin] Other (See Comments)    Aching, MS   Zocor [Simvastatin-High Dose] Other (See Comments)    MS aches   Celexa [Citalopram Hydrobromide] Other (See Comments)    Nausea,blurred vision   Crestor [Rosuvastatin]     Pain, aching muscles   Mobic [Meloxicam] Other (See Comments)    nausea   Prozac [Fluoxetine Hcl] Other (See Comments)    fatigue     Physical Exam:   Vitals  Blood pressure (!) 123/102, pulse 67, temperature 98.2 F (36.8 C), temperature source Oral, resp. rate 17, height 5\' 6"  (1.676 m), weight 78 kg, SpO2 95 %.  1.  General: Patient lying supine in bed,  no acute distress   2. Psychiatric: Alert and oriented x 3, mood and behavior normal for situation, pleasant and cooperative with exam   3. Neurologic: Speech and language are normal, face is symmetric, moves all 4 extremities voluntarily, at baseline without acute deficits on limited exam   4. HEENMT:  Head is atraumatic, normocephalic, pupils reactive to  light, neck is supple, trachea is midline, mucous membranes are moist   5. Respiratory : Lungs are clear to auscultation bilaterally without wheezing, rhonchi, rales, no cyanosis, no increase in work of breathing or accessory muscle use   6. Cardiovascular : Heart rate normal, rhythm is regular, no murmurs, rubs or gallops, no peripheral edema, peripheral pulses palpated   7. Gastrointestinal:  Abdomen is soft, nondistended, nontender to palpation bowel sounds active, no masses or organomegaly palpated   8. Skin:  Skin is warm, dry and intact without rashes, acute lesions, or ulcers on limited exam   9.Musculoskeletal:  No acute deformities or trauma, no asymmetry in tone, no peripheral edema, peripheral pulses palpated, no tenderness to palpation in the extremities     Data Review:    CBC Recent Labs  Lab 09/14/20 1349  WBC 14.6*  HGB 11.1*  HCT 33.5*  PLT 195  MCV 91.5  MCH 30.3  MCHC 33.1  RDW 12.8  LYMPHSABS 0.9  MONOABS 0.7  EOSABS 0.0  BASOSABS 0.0   ------------------------------------------------------------------------------------------------------------------  Results for orders placed or performed during the hospital encounter of 09/14/20 (from the  past 48 hour(s))  Resp Panel by RT-PCR (Flu A&B, Covid) Nasopharyngeal Swab     Status: None   Collection Time: 09/14/20  1:40 PM   Specimen: Nasopharyngeal Swab; Nasopharyngeal(NP) swabs in vial transport medium  Result Value Ref Range   SARS Coronavirus 2 by RT PCR NEGATIVE NEGATIVE    Comment: (NOTE) SARS-CoV-2 target nucleic acids are NOT DETECTED.  The SARS-CoV-2 RNA is generally detectable in upper respiratory specimens during the acute phase of infection. The lowest concentration of SARS-CoV-2 viral copies this assay can detect is 138 copies/mL. A negative result does not preclude SARS-Cov-2 infection and should not be used as the sole basis for treatment or other patient management decisions. A  negative result may occur with  improper specimen collection/handling, submission of specimen other than nasopharyngeal swab, presence of viral mutation(s) within the areas targeted by this assay, and inadequate number of viral copies(<138 copies/mL). A negative result must be combined with clinical observations, patient history, and epidemiological information. The expected result is Negative.  Fact Sheet for Patients:  EntrepreneurPulse.com.au  Fact Sheet for Healthcare Providers:  IncredibleEmployment.be  This test is no t yet approved or cleared by the [place] FDA and  has been authorized for detection and/or diagnosis of SARS-CoV-2 by FDA under an Emergency Use Authorization (EUA). This EUA will remain  in effect (meaning this test can be used) for the duration of the COVID-19 declaration under Section 564(b)(1) of the Act, 21 U.S.C.section 360bbb-3(b)(1), unless the authorization is terminated  or revoked sooner.       Influenza A by PCR NEGATIVE NEGATIVE   Influenza B by PCR NEGATIVE NEGATIVE    Comment: (NOTE) The Xpert Xpress SARS-CoV-2/FLU/RSV plus assay is intended as an aid in the diagnosis of influenza from Nasopharyngeal swab specimens and should not be used as a sole basis for treatment. Nasal washings and aspirates are unacceptable for Xpert Xpress SARS-CoV-2/FLU/RSV testing.  Fact Sheet for Patients: EntrepreneurPulse.com.au  Fact Sheet for Healthcare Providers: IncredibleEmployment.be  This test is not yet approved or cleared by the [place] FDA and has been authorized for detection and/or diagnosis of SARS-CoV-2 by FDA under an Emergency Use Authorization (EUA). This EUA will remain in effect (meaning this test can be used) for the duration of the COVID-19 declaration under Section 564(b)(1) of the Act, 21 U.S.C. section 360bbb-3(b)(1), unless the authorization is terminated  or revoked.  Performed at [facility], [address], [place], [place] [address]   Comprehensive metabolic panel     Status: Abnormal   Collection Time: 09/14/20  1:49 PM  Result Value Ref Range   Sodium 134 (L) 135 - 145 mmol/L   Potassium 3.4 (L) 3.5 - 5.1 mmol/L   Chloride 98 98 - 111 mmol/L   CO2 27 22 - 32 mmol/L   Glucose, Bld 140 (H) 70 - 99 mg/dL    Comment: Glucose reference range applies only to samples taken after fasting for at least 8 hours.   BUN 17 8 - 23 mg/dL   Creatinine, Ser 1.51 (H) 0.61 - 1.24 mg/dL   Calcium 7.8 (L) 8.9 - 10.3 mg/dL   Total Protein 5.2 (L) 6.5 - 8.1 g/dL   Albumin 2.9 (L) 3.5 - 5.0 g/dL   AST 18 15 - 41 U/L   ALT 11 0 - 44 U/L   Alkaline Phosphatase 77 38 - 126 U/L   Total Bilirubin 0.7 0.3 - 1.2 mg/dL   GFR, Estimated 48 (L) >60 mL/min  Comment: (NOTE) Calculated using the CKD-EPI Creatinine Equation (2021)    Anion gap 9 5 - 15    Comment: Performed at [facility], [address], [place], [place] [address]  CBC with Differential     Status: Abnormal   Collection Time: 09/14/20  1:49 PM  Result Value Ref Range   WBC 14.6 (H) 4.0 - 10.5 K/uL   RBC 3.66 (L) 4.22 - 5.81 MIL/uL   Hemoglobin 11.1 (L) 13.0 - 17.0 g/dL   HCT 33.5 (L) 39.0 - 52.0 %   MCV 91.5 80.0 - 100.0 fL   MCH 30.3 26.0 - 34.0 pg   MCHC 33.1 30.0 - 36.0 g/dL   RDW 12.8 11.5 - 15.5 %   Platelets 195 150 - 400 K/uL   nRBC 0.0 0.0 - 0.2 %   Neutrophils Relative % 88 %   Neutro Abs 12.8 (H) 1.7 - 7.7 K/uL   Lymphocytes Relative 6 %   Lymphs Abs 0.9 0.7 - 4.0 K/uL   Monocytes Relative 5 %   Monocytes Absolute 0.7 0.1 - 1.0 K/uL   Eosinophils Relative 0 %   Eosinophils Absolute 0.0 0.0 - 0.5 K/uL   Basophils Relative 0 %   Basophils Absolute 0.0 0.0 - 0.1 K/uL   Immature Granulocytes 1 %   Abs Immature Granulocytes 0.08 (H) 0.00 - 0.07 K/uL    Comment: Performed at [facility], [address] [place], [place] [address]  Lipase, blood     Status: None    Collection Time: 09/14/20  1:49 PM  Result Value Ref Range   Lipase 27 11 - 51 U/L    Comment: Performed at [facility], [address] [place], [place] [address]  Blood culture (routine x 2)     Status: None (Preliminary result)   Collection Time: 09/14/20  5:29 PM   Specimen: BLOOD RIGHT HAND  Result Value Ref Range   Specimen Description BLOOD RIGHT HAND    Special Requests      BOTTLES DRAWN AEROBIC AND ANAEROBIC Blood Culture adequate volume Performed at [facility], [address], [place], [place] [address]    Culture PENDING    Report Status PENDING   Blood culture (routine x 2)     Status: None (Preliminary result)   Collection Time: 09/14/20  5:29 PM   Specimen: BLOOD LEFT ARM  Result Value Ref Range   Specimen Description BLOOD LEFT ARM    Special Requests      BOTTLES DRAWN AEROBIC AND ANAEROBIC Blood Culture adequate volume Performed at [facility], [address], [place], [place] [address]    Culture PENDING    Report Status PENDING   Urinalysis, Routine w reflex microscopic Urine, Clean Catch     Status: Abnormal   Collection Time: 09/14/20  6:35 PM  Result Value Ref Range   Color, Urine YELLOW YELLOW   APPearance HAZY (A) CLEAR   Specific Gravity, Urine 1.015 1.005 - 1.030   pH 5.5 5.0 - 8.0   Glucose, UA NEGATIVE NEGATIVE mg/dL   Hgb urine dipstick NEGATIVE NEGATIVE   Bilirubin Urine NEGATIVE NEGATIVE   Ketones, ur NEGATIVE NEGATIVE mg/dL   Protein, ur NEGATIVE NEGATIVE mg/dL   Nitrite NEGATIVE NEGATIVE   Leukocytes,Ua NEGATIVE NEGATIVE    Comment: Microscopic not done on urines with negative protein, blood, leukocytes, nitrite, or glucose < 500 mg/dL. Performed at [facility], [address] [place], [place] [address]   Urine rapid drug screen (hosp performed)  Status: Abnormal   Collection Time: 09/14/20  6:35 PM  Result Value Ref Range   Opiates NONE DETECTED NONE DETECTED   Cocaine NONE DETECTED NONE DETECTED   Benzodiazepines POSITIVE (A)  NONE DETECTED   Amphetamines NONE DETECTED NONE DETECTED   Tetrahydrocannabinol NONE DETECTED NONE DETECTED   Barbiturates NONE DETECTED NONE DETECTED    Comment: (NOTE) DRUG SCREEN FOR MEDICAL PURPOSES ONLY.  IF CONFIRMATION IS NEEDED FOR ANY PURPOSE, NOTIFY LAB WITHIN 5 DAYS.  LOWEST DETECTABLE LIMITS FOR URINE DRUG SCREEN Drug Class                     Cutoff (ng/mL) Amphetamine and metabolites    1000 Barbiturate and metabolites    200 Benzodiazepine                 361 Tricyclics and metabolites     300 Opiates and metabolites        300 Cocaine and metabolites        300 THC                            50 Performed at [facility], [address], [place], [place] [address]   CBG monitoring, ED     Status: Abnormal   Collection Time: 09/14/20 10:04 PM  Result Value Ref Range   Glucose-Capillary 114 (H) 70 - 99 mg/dL    Comment: Glucose reference range applies only to samples taken after fasting for at least 8 hours.    Chemistries  Recent Labs  Lab 09/14/20 1349  NA 134*  K 3.4*  CL 98  CO2 27  GLUCOSE 140*  BUN 17  CREATININE 1.51*  CALCIUM 7.8*  AST 18  ALT 11  ALKPHOS 77  BILITOT 0.7   ------------------------------------------------------------------------------------------------------------------  ------------------------------------------------------------------------------------------------------------------ GFR: Estimated Creatinine Clearance: 41.6 mL/min (A) (by C-G formula based on SCr of 1.51 mg/dL (H)). Liver Function Tests: Recent Labs  Lab 09/14/20 1349  AST 18  ALT 11  ALKPHOS 77  BILITOT 0.7  PROT 5.2*  ALBUMIN 2.9*   Recent Labs  Lab 09/14/20 1349  LIPASE 27   No results for input(s): AMMONIA in the last 168 hours. Coagulation Profile: No results for input(s): INR, PROTIME in the last 168 hours. Cardiac Enzymes: No results for input(s): CKTOTAL, CKMB, CKMBINDEX, TROPONINI in the last 168 hours. BNP (last 3 results) No  results for input(s): PROBNP in the last 8760 hours. HbA1C: No results for input(s): HGBA1C in the last 72 hours. CBG: Recent Labs  Lab 09/14/20 2204  GLUCAP 114*   Lipid Profile: No results for input(s): CHOL, HDL, LDLCALC, TRIG, CHOLHDL, LDLDIRECT in the last 72 hours. Thyroid Function Tests: No results for input(s): TSH, T4TOTAL, FREET4, T3FREE, THYROIDAB in the last 72 hours. Anemia Panel: No results for input(s): VITAMINB12, FOLATE, FERRITIN, TIBC, IRON, RETICCTPCT in the last 72 hours.  --------------------------------------------------------------------------------------------------------------- Urine analysis:    Component Value Date/Time   COLORURINE YELLOW 09/14/2020 1835   APPEARANCEUR HAZY (A) 09/14/2020 1835   LABSPEC 1.015 09/14/2020 1835   PHURINE 5.5 09/14/2020 1835   GLUCOSEU NEGATIVE 09/14/2020 1835   HGBUR NEGATIVE 09/14/2020 1835   BILIRUBINUR NEGATIVE 09/14/2020 [place] 09/14/2020 1835   PROTEINUR NEGATIVE 09/14/2020 1835   NITRITE NEGATIVE 09/14/2020 1835   LEUKOCYTESUR NEGATIVE 09/14/2020 1835      Imaging Results:    DG Ankle 2 Views Left  Result Date: 09/14/2020 CLINICAL  DATA:  Chronic left ankle pain EXAM: LEFT ANKLE - 2 VIEW COMPARISON:  Same day radiographs of the left foot, CT left lower extremity, 10/24/2018 FINDINGS: Redemonstrated chronic fracture deformity of the distal left tibia and fibula, with a fractured cement nail present in a subtalar fusion osteotomy. No acute fracture. There is severe arthrosis of the ankle mortise. Vascular calcinosis. IMPRESSION: 1. Redemonstrated chronic fracture deformity of the distal left tibia and fibula, with a fractured cement nail present in a subtalar fusion osteotomy. 2. Severe arthrosis of the ankle mortise. Electronically Signed   By: Eddie Candle M.D.   On: 09/14/2020 20:19   CT Head Wo Contrast  Result Date: 09/14/2020 CLINICAL DATA:  Head trauma.  Low blood pressure. EXAM: CT HEAD  WITHOUT CONTRAST TECHNIQUE: Contiguous axial images were obtained from the base of the skull through the vertex without intravenous contrast. COMPARISON:  None FINDINGS: Brain: No evidence of acute infarction, hemorrhage, hydrocephalus, extra-axial collection or mass lesion/mass effect. Signs of atrophy and of chronic microvascular ischemic change Vascular: No hyperdense vessel or unexpected calcification. Skull: Signs of previous RIGHT mastoidectomy. No acute or destructive process related to the calvarium Sinuses/Orbits: No acute finding. Other: None IMPRESSION: 1. No acute intracranial pathology. 2. Signs of atrophy and chronic microvascular ischemic change. 3. Signs of previous RIGHT mastoidectomy. Electronically Signed   By: Zetta Bills M.D.   On: 09/14/2020 13:52   DG Chest Portable 1 View  Result Date: 09/14/2020 CLINICAL DATA:  Cough EXAM: PORTABLE CHEST 1 VIEW COMPARISON:  10/06/2017 FINDINGS: Cardiomegaly. Both lungs are clear. The visualized skeletal structures are unremarkable. IMPRESSION: Cardiomegaly without acute abnormality of the lungs in AP portable projection. Electronically Signed   By: Eddie Candle M.D.   On: 09/14/2020 20:17   DG Foot Complete Left  Result Date: 09/14/2020 CLINICAL DATA:  Foot pain, several falls within the last couple of days. Bilateral foot pain and soreness. EXAM: LEFT FOOT - COMPLETE 3+ VIEW COMPARISON:  CT of the LEFT ankle from August of 2020. Plain film of the LEFT ankle from July of 2016. FINDINGS: Osteopenia. Degenerative changes throughout the midfoot. These changes are marked and limited assessment. Fracture of an implant extending from the tibia through the calcaneus related to bony fusion about the LEFT ankle. Since previous CT imaging the intramedullary device that was secured with locking screws has been replaced by this device. Marked degenerative process in tibia talar joint. No signs of fracture about the midfoot or forefoot. IMPRESSION: Fracture of  a cement nail with guidewire extending from the medullary canal of the tibia into the subtalar joint and through the calcaneus at the site of previous hardware. Given this appearance dedicated ankle imaging may be helpful for further assessment as warranted. Marked degenerative changes without signs of acute fracture. Electronically Signed   By: Zetta Bills M.D.   On: 09/14/2020 17:18   DG Foot Complete Right  Result Date: 09/14/2020 CLINICAL DATA:  Foot pain, falls. EXAM: RIGHT FOOT COMPLETE - 3+ VIEW COMPARISON:  None FINDINGS: Degenerative changes about the ankle and midfoot. Osteopenia. Signs of vascular calcifications soft tissues. No sign of acute fracture or dislocation. Plantar spurring along the plantar surface shin of the calcaneus. IMPRESSION: 1. No sign of acute fracture or dislocation. 2. Osteopenia and degenerative changes. 3. Irregularity of the lateral malleolus is noted on AP view. The patient reportedly has a history of a minimally displaced fracture in this location based on reports from May of 2022 from [facility]. Correlate  with any worsening pain in this area. Electronically Signed   By: Zetta Bills M.D.   On: 09/14/2020 17:22      Assessment & Plan:    Active Problems:   GERD (gastroesophageal reflux disease)   Diabetes (HCC)   Acute metabolic encephalopathy   AKI (acute kidney injury) ([place])   Leukocytosis   Hypokalemia   Moderate protein-calorie malnutrition (HCC)   Acute metabolic encephalopathy with multiple falls Most likely secondary to polypharmacy Will continue Subutex but reducing Xanax, holding Norco and oxycodone, holding gabapentin UDS positive only for benzos as prescribed Chest x-ray without signs of infection, UA is not indicative of infection Consult PT Continue to monitor glucose and electrolytes Admit for observation AKI Creatinine baseline 0.82 years ago Creatinine today 1.51, BUN 17 Continue normal saline Hold losartan  hydrochlorothiazide and Protonix Avoid other nephrotoxic agents when possible Trend in the a.m. Leukocytosis Leukocytosis of 14 No sign of infection on chest x-ray or UA Patient reports that his cough is chronic No other infectious symptoms Trend in the a.m. Hypokalemia Replace and recheck Moderate protein calorie malnutrition Protein shakes between meals Tobacco use disorder Counseled on the importance of cessation Smokes a pack per day Declines nicotine patch at this time GERD Holding Protonix in the setting of AKI Hypertension Continue metoprolol Holding hydrochlorothiazide and losartan in the setting of AKI Diabetes mellitus type 2 Continue long-acting insulin, sliding scale coverage, carb modified diet Monitor CBGs    DVT Prophylaxis-Heparin- SCDs   AM Labs Ordered, also please review Full Orders  Family Communication: No family at bedside  Code Status: Full  Admission status: Observation Time spent in minutes : [place] DO

## 2020-09-17 LAB — HEMOGLOBIN A1C
Hgb A1c MFr Bld: 5.8 % — ABNORMAL HIGH (ref 4.8–5.6)
Mean Plasma Glucose: 120 mg/dL

## 2020-09-19 DIAGNOSIS — K219 Gastro-esophageal reflux disease without esophagitis: Secondary | ICD-10-CM | POA: Diagnosis not present

## 2020-09-19 DIAGNOSIS — I10 Essential (primary) hypertension: Secondary | ICD-10-CM | POA: Diagnosis not present

## 2020-09-19 DIAGNOSIS — E119 Type 2 diabetes mellitus without complications: Secondary | ICD-10-CM | POA: Diagnosis not present

## 2020-09-19 LAB — CULTURE, BLOOD (ROUTINE X 2)
Culture: NO GROWTH
Culture: NO GROWTH
Special Requests: ADEQUATE
Special Requests: ADEQUATE

## 2020-09-20 DIAGNOSIS — E1142 Type 2 diabetes mellitus with diabetic polyneuropathy: Secondary | ICD-10-CM | POA: Diagnosis not present

## 2020-09-20 DIAGNOSIS — Z09 Encounter for follow-up examination after completed treatment for conditions other than malignant neoplasm: Secondary | ICD-10-CM | POA: Diagnosis not present

## 2020-09-20 DIAGNOSIS — E876 Hypokalemia: Secondary | ICD-10-CM | POA: Diagnosis not present

## 2020-09-20 DIAGNOSIS — I700 Atherosclerosis of aorta: Secondary | ICD-10-CM | POA: Diagnosis not present

## 2020-09-20 DIAGNOSIS — I10 Essential (primary) hypertension: Secondary | ICD-10-CM | POA: Diagnosis not present

## 2020-09-24 DIAGNOSIS — M25571 Pain in right ankle and joints of right foot: Secondary | ICD-10-CM | POA: Diagnosis not present

## 2020-09-24 DIAGNOSIS — S8261XA Displaced fracture of lateral malleolus of right fibula, initial encounter for closed fracture: Secondary | ICD-10-CM | POA: Diagnosis not present

## 2020-09-24 DIAGNOSIS — M25572 Pain in left ankle and joints of left foot: Secondary | ICD-10-CM | POA: Diagnosis not present

## 2020-09-24 DIAGNOSIS — E1349 Other specified diabetes mellitus with other diabetic neurological complication: Secondary | ICD-10-CM | POA: Diagnosis not present

## 2020-10-02 DIAGNOSIS — M25571 Pain in right ankle and joints of right foot: Secondary | ICD-10-CM | POA: Diagnosis not present

## 2020-10-02 DIAGNOSIS — M14671 Charcot's joint, right ankle and foot: Secondary | ICD-10-CM | POA: Diagnosis not present

## 2020-10-02 DIAGNOSIS — M79671 Pain in right foot: Secondary | ICD-10-CM | POA: Diagnosis not present

## 2020-10-04 DIAGNOSIS — S8291XA Unspecified fracture of right lower leg, initial encounter for closed fracture: Secondary | ICD-10-CM | POA: Diagnosis not present

## 2020-10-04 DIAGNOSIS — I10 Essential (primary) hypertension: Secondary | ICD-10-CM | POA: Diagnosis not present

## 2020-10-04 DIAGNOSIS — Z87891 Personal history of nicotine dependence: Secondary | ICD-10-CM | POA: Diagnosis not present

## 2020-10-04 DIAGNOSIS — Z299 Encounter for prophylactic measures, unspecified: Secondary | ICD-10-CM | POA: Diagnosis not present

## 2020-10-11 DIAGNOSIS — E1165 Type 2 diabetes mellitus with hyperglycemia: Secondary | ICD-10-CM | POA: Diagnosis not present

## 2020-10-11 DIAGNOSIS — I10 Essential (primary) hypertension: Secondary | ICD-10-CM | POA: Diagnosis not present

## 2020-10-11 DIAGNOSIS — S8291XA Unspecified fracture of right lower leg, initial encounter for closed fracture: Secondary | ICD-10-CM | POA: Diagnosis not present

## 2020-10-11 DIAGNOSIS — Z299 Encounter for prophylactic measures, unspecified: Secondary | ICD-10-CM | POA: Diagnosis not present

## 2020-10-11 DIAGNOSIS — E1139 Type 2 diabetes mellitus with other diabetic ophthalmic complication: Secondary | ICD-10-CM | POA: Diagnosis not present

## 2020-10-11 DIAGNOSIS — H548 Legal blindness, as defined in USA: Secondary | ICD-10-CM | POA: Diagnosis not present

## 2020-10-17 DIAGNOSIS — M25532 Pain in left wrist: Secondary | ICD-10-CM | POA: Diagnosis not present

## 2020-10-17 DIAGNOSIS — Z79891 Long term (current) use of opiate analgesic: Secondary | ICD-10-CM | POA: Diagnosis not present

## 2020-10-17 DIAGNOSIS — M79662 Pain in left lower leg: Secondary | ICD-10-CM | POA: Diagnosis not present

## 2020-10-17 DIAGNOSIS — G5603 Carpal tunnel syndrome, bilateral upper limbs: Secondary | ICD-10-CM | POA: Diagnosis not present

## 2020-10-17 DIAGNOSIS — M130 Polyarthritis, unspecified: Secondary | ICD-10-CM | POA: Diagnosis not present

## 2020-10-17 DIAGNOSIS — M25559 Pain in unspecified hip: Secondary | ICD-10-CM | POA: Diagnosis not present

## 2020-10-17 DIAGNOSIS — M545 Low back pain, unspecified: Secondary | ICD-10-CM | POA: Diagnosis not present

## 2020-10-17 DIAGNOSIS — M5010 Cervical disc disorder with radiculopathy, unspecified cervical region: Secondary | ICD-10-CM | POA: Diagnosis not present

## 2020-10-18 DIAGNOSIS — M14671 Charcot's joint, right ankle and foot: Secondary | ICD-10-CM | POA: Diagnosis not present

## 2020-10-18 DIAGNOSIS — S8261XA Displaced fracture of lateral malleolus of right fibula, initial encounter for closed fracture: Secondary | ICD-10-CM | POA: Diagnosis not present

## 2020-10-18 DIAGNOSIS — M25571 Pain in right ankle and joints of right foot: Secondary | ICD-10-CM | POA: Diagnosis not present

## 2020-10-18 DIAGNOSIS — M79671 Pain in right foot: Secondary | ICD-10-CM | POA: Diagnosis not present

## 2020-11-01 DIAGNOSIS — M14671 Charcot's joint, right ankle and foot: Secondary | ICD-10-CM | POA: Diagnosis not present

## 2020-11-15 DIAGNOSIS — M14671 Charcot's joint, right ankle and foot: Secondary | ICD-10-CM | POA: Diagnosis not present

## 2020-11-20 DIAGNOSIS — E119 Type 2 diabetes mellitus without complications: Secondary | ICD-10-CM | POA: Diagnosis not present

## 2020-11-20 DIAGNOSIS — I10 Essential (primary) hypertension: Secondary | ICD-10-CM | POA: Diagnosis not present

## 2020-11-20 DIAGNOSIS — K219 Gastro-esophageal reflux disease without esophagitis: Secondary | ICD-10-CM | POA: Diagnosis not present

## 2020-12-03 DIAGNOSIS — M14671 Charcot's joint, right ankle and foot: Secondary | ICD-10-CM | POA: Diagnosis not present

## 2020-12-06 DIAGNOSIS — M14671 Charcot's joint, right ankle and foot: Secondary | ICD-10-CM | POA: Diagnosis not present

## 2020-12-13 DIAGNOSIS — I700 Atherosclerosis of aorta: Secondary | ICD-10-CM | POA: Diagnosis not present

## 2020-12-13 DIAGNOSIS — Z299 Encounter for prophylactic measures, unspecified: Secondary | ICD-10-CM | POA: Diagnosis not present

## 2020-12-13 DIAGNOSIS — E1165 Type 2 diabetes mellitus with hyperglycemia: Secondary | ICD-10-CM | POA: Diagnosis not present

## 2020-12-13 DIAGNOSIS — F17210 Nicotine dependence, cigarettes, uncomplicated: Secondary | ICD-10-CM | POA: Diagnosis not present

## 2020-12-13 DIAGNOSIS — Z23 Encounter for immunization: Secondary | ICD-10-CM | POA: Diagnosis not present

## 2020-12-13 DIAGNOSIS — I10 Essential (primary) hypertension: Secondary | ICD-10-CM | POA: Diagnosis not present

## 2020-12-19 DIAGNOSIS — Z299 Encounter for prophylactic measures, unspecified: Secondary | ICD-10-CM | POA: Diagnosis not present

## 2020-12-19 DIAGNOSIS — I700 Atherosclerosis of aorta: Secondary | ICD-10-CM | POA: Diagnosis not present

## 2020-12-19 DIAGNOSIS — F17210 Nicotine dependence, cigarettes, uncomplicated: Secondary | ICD-10-CM | POA: Diagnosis not present

## 2020-12-19 DIAGNOSIS — I10 Essential (primary) hypertension: Secondary | ICD-10-CM | POA: Diagnosis not present

## 2020-12-19 DIAGNOSIS — J329 Chronic sinusitis, unspecified: Secondary | ICD-10-CM | POA: Diagnosis not present

## 2020-12-19 DIAGNOSIS — E1165 Type 2 diabetes mellitus with hyperglycemia: Secondary | ICD-10-CM | POA: Diagnosis not present

## 2020-12-20 DIAGNOSIS — E11319 Type 2 diabetes mellitus with unspecified diabetic retinopathy without macular edema: Secondary | ICD-10-CM | POA: Diagnosis not present

## 2020-12-24 DIAGNOSIS — R059 Cough, unspecified: Secondary | ICD-10-CM | POA: Diagnosis not present

## 2020-12-24 DIAGNOSIS — F17210 Nicotine dependence, cigarettes, uncomplicated: Secondary | ICD-10-CM | POA: Diagnosis not present

## 2020-12-24 DIAGNOSIS — Z7984 Long term (current) use of oral hypoglycemic drugs: Secondary | ICD-10-CM | POA: Diagnosis not present

## 2020-12-24 DIAGNOSIS — R062 Wheezing: Secondary | ICD-10-CM | POA: Diagnosis not present

## 2020-12-24 DIAGNOSIS — Z7982 Long term (current) use of aspirin: Secondary | ICD-10-CM | POA: Diagnosis not present

## 2020-12-24 DIAGNOSIS — Z79899 Other long term (current) drug therapy: Secondary | ICD-10-CM | POA: Diagnosis not present

## 2020-12-24 DIAGNOSIS — Z794 Long term (current) use of insulin: Secondary | ICD-10-CM | POA: Diagnosis not present

## 2020-12-24 DIAGNOSIS — E119 Type 2 diabetes mellitus without complications: Secondary | ICD-10-CM | POA: Diagnosis not present

## 2020-12-24 DIAGNOSIS — I10 Essential (primary) hypertension: Secondary | ICD-10-CM | POA: Diagnosis not present

## 2020-12-25 DIAGNOSIS — I700 Atherosclerosis of aorta: Secondary | ICD-10-CM | POA: Diagnosis not present

## 2020-12-25 DIAGNOSIS — Z299 Encounter for prophylactic measures, unspecified: Secondary | ICD-10-CM | POA: Diagnosis not present

## 2020-12-25 DIAGNOSIS — J209 Acute bronchitis, unspecified: Secondary | ICD-10-CM | POA: Diagnosis not present

## 2020-12-25 DIAGNOSIS — J440 Chronic obstructive pulmonary disease with acute lower respiratory infection: Secondary | ICD-10-CM | POA: Diagnosis not present

## 2020-12-25 DIAGNOSIS — I10 Essential (primary) hypertension: Secondary | ICD-10-CM | POA: Diagnosis not present

## 2020-12-27 DIAGNOSIS — M14671 Charcot's joint, right ankle and foot: Secondary | ICD-10-CM | POA: Diagnosis not present

## 2021-01-02 DIAGNOSIS — M14671 Charcot's joint, right ankle and foot: Secondary | ICD-10-CM | POA: Diagnosis not present

## 2021-01-02 DIAGNOSIS — J440 Chronic obstructive pulmonary disease with acute lower respiratory infection: Secondary | ICD-10-CM | POA: Diagnosis not present

## 2021-01-02 DIAGNOSIS — Z299 Encounter for prophylactic measures, unspecified: Secondary | ICD-10-CM | POA: Diagnosis not present

## 2021-01-02 DIAGNOSIS — J209 Acute bronchitis, unspecified: Secondary | ICD-10-CM | POA: Diagnosis not present

## 2021-01-02 DIAGNOSIS — I10 Essential (primary) hypertension: Secondary | ICD-10-CM | POA: Diagnosis not present

## 2021-01-09 DIAGNOSIS — M130 Polyarthritis, unspecified: Secondary | ICD-10-CM | POA: Diagnosis not present

## 2021-01-09 DIAGNOSIS — M545 Low back pain, unspecified: Secondary | ICD-10-CM | POA: Diagnosis not present

## 2021-01-09 DIAGNOSIS — M79662 Pain in left lower leg: Secondary | ICD-10-CM | POA: Diagnosis not present

## 2021-01-09 DIAGNOSIS — M25532 Pain in left wrist: Secondary | ICD-10-CM | POA: Diagnosis not present

## 2021-01-09 DIAGNOSIS — G5603 Carpal tunnel syndrome, bilateral upper limbs: Secondary | ICD-10-CM | POA: Diagnosis not present

## 2021-01-09 DIAGNOSIS — M5010 Cervical disc disorder with radiculopathy, unspecified cervical region: Secondary | ICD-10-CM | POA: Diagnosis not present

## 2021-01-09 DIAGNOSIS — M25559 Pain in unspecified hip: Secondary | ICD-10-CM | POA: Diagnosis not present

## 2021-01-09 DIAGNOSIS — Z79891 Long term (current) use of opiate analgesic: Secondary | ICD-10-CM | POA: Diagnosis not present

## 2021-01-10 DIAGNOSIS — J329 Chronic sinusitis, unspecified: Secondary | ICD-10-CM | POA: Diagnosis not present

## 2021-01-10 DIAGNOSIS — R059 Cough, unspecified: Secondary | ICD-10-CM | POA: Diagnosis not present

## 2021-01-10 DIAGNOSIS — Z299 Encounter for prophylactic measures, unspecified: Secondary | ICD-10-CM | POA: Diagnosis not present

## 2021-01-10 DIAGNOSIS — I10 Essential (primary) hypertension: Secondary | ICD-10-CM | POA: Diagnosis not present

## 2021-01-14 DIAGNOSIS — D3132 Benign neoplasm of left choroid: Secondary | ICD-10-CM | POA: Diagnosis not present

## 2021-01-14 DIAGNOSIS — Z794 Long term (current) use of insulin: Secondary | ICD-10-CM | POA: Diagnosis not present

## 2021-01-14 DIAGNOSIS — H40001 Preglaucoma, unspecified, right eye: Secondary | ICD-10-CM | POA: Diagnosis not present

## 2021-01-14 DIAGNOSIS — E113513 Type 2 diabetes mellitus with proliferative diabetic retinopathy with macular edema, bilateral: Secondary | ICD-10-CM | POA: Diagnosis not present

## 2021-01-14 DIAGNOSIS — Z7984 Long term (current) use of oral hypoglycemic drugs: Secondary | ICD-10-CM | POA: Diagnosis not present

## 2021-01-14 DIAGNOSIS — Z961 Presence of intraocular lens: Secondary | ICD-10-CM | POA: Diagnosis not present

## 2021-01-19 DIAGNOSIS — E11319 Type 2 diabetes mellitus with unspecified diabetic retinopathy without macular edema: Secondary | ICD-10-CM | POA: Diagnosis not present

## 2021-01-21 DIAGNOSIS — E1165 Type 2 diabetes mellitus with hyperglycemia: Secondary | ICD-10-CM | POA: Diagnosis not present

## 2021-01-21 DIAGNOSIS — J440 Chronic obstructive pulmonary disease with acute lower respiratory infection: Secondary | ICD-10-CM | POA: Diagnosis not present

## 2021-01-21 DIAGNOSIS — I10 Essential (primary) hypertension: Secondary | ICD-10-CM | POA: Diagnosis not present

## 2021-01-21 DIAGNOSIS — J209 Acute bronchitis, unspecified: Secondary | ICD-10-CM | POA: Diagnosis not present

## 2021-01-21 DIAGNOSIS — Z299 Encounter for prophylactic measures, unspecified: Secondary | ICD-10-CM | POA: Diagnosis not present

## 2021-01-24 DIAGNOSIS — M14671 Charcot's joint, right ankle and foot: Secondary | ICD-10-CM | POA: Diagnosis not present

## 2021-02-02 DIAGNOSIS — M14671 Charcot's joint, right ankle and foot: Secondary | ICD-10-CM | POA: Diagnosis not present

## 2021-02-17 DIAGNOSIS — Z87891 Personal history of nicotine dependence: Secondary | ICD-10-CM | POA: Diagnosis not present

## 2021-02-17 DIAGNOSIS — Z7189 Other specified counseling: Secondary | ICD-10-CM | POA: Diagnosis not present

## 2021-02-17 DIAGNOSIS — R5383 Other fatigue: Secondary | ICD-10-CM | POA: Diagnosis not present

## 2021-02-17 DIAGNOSIS — Z299 Encounter for prophylactic measures, unspecified: Secondary | ICD-10-CM | POA: Diagnosis not present

## 2021-02-17 DIAGNOSIS — E7800 Pure hypercholesterolemia, unspecified: Secondary | ICD-10-CM | POA: Diagnosis not present

## 2021-02-17 DIAGNOSIS — Z79899 Other long term (current) drug therapy: Secondary | ICD-10-CM | POA: Diagnosis not present

## 2021-02-17 DIAGNOSIS — I10 Essential (primary) hypertension: Secondary | ICD-10-CM | POA: Diagnosis not present

## 2021-02-17 DIAGNOSIS — Z0000 Encounter for general adult medical examination without abnormal findings: Secondary | ICD-10-CM | POA: Diagnosis not present

## 2021-02-19 DIAGNOSIS — E11319 Type 2 diabetes mellitus with unspecified diabetic retinopathy without macular edema: Secondary | ICD-10-CM | POA: Diagnosis not present

## 2021-02-21 DIAGNOSIS — M79671 Pain in right foot: Secondary | ICD-10-CM | POA: Diagnosis not present

## 2021-02-27 DIAGNOSIS — Z299 Encounter for prophylactic measures, unspecified: Secondary | ICD-10-CM | POA: Diagnosis not present

## 2021-02-27 DIAGNOSIS — C4492 Squamous cell carcinoma of skin, unspecified: Secondary | ICD-10-CM | POA: Diagnosis not present

## 2021-02-27 DIAGNOSIS — D485 Neoplasm of uncertain behavior of skin: Secondary | ICD-10-CM | POA: Diagnosis not present

## 2021-02-27 DIAGNOSIS — I10 Essential (primary) hypertension: Secondary | ICD-10-CM | POA: Diagnosis not present

## 2021-02-27 DIAGNOSIS — C4442 Squamous cell carcinoma of skin of scalp and neck: Secondary | ICD-10-CM | POA: Diagnosis not present

## 2021-03-04 ENCOUNTER — Other Ambulatory Visit: Payer: Self-pay

## 2021-03-04 ENCOUNTER — Encounter: Payer: Self-pay | Admitting: Internal Medicine

## 2021-03-04 ENCOUNTER — Ambulatory Visit (INDEPENDENT_AMBULATORY_CARE_PROVIDER_SITE_OTHER): Payer: Medicare Other | Admitting: Internal Medicine

## 2021-03-04 ENCOUNTER — Ambulatory Visit (HOSPITAL_COMMUNITY)
Admission: RE | Admit: 2021-03-04 | Discharge: 2021-03-04 | Disposition: A | Discharge status: Home / Self Care | Payer: Medicare Other | Source: Ambulatory Visit | Attending: Internal Medicine | Admitting: Internal Medicine

## 2021-03-04 DIAGNOSIS — F17210 Nicotine dependence, cigarettes, uncomplicated: Secondary | ICD-10-CM

## 2021-03-04 DIAGNOSIS — M14671 Charcot's joint, right ankle and foot: Secondary | ICD-10-CM | POA: Diagnosis not present

## 2021-03-04 DIAGNOSIS — J449 Chronic obstructive pulmonary disease, unspecified: Secondary | ICD-10-CM | POA: Diagnosis not present

## 2021-03-04 NOTE — Progress Notes (Signed)
Jason Woods, male    DOB: [birth year],     MRN: [id number]   Brief patient profile:  75  yowm active smoker worked in [employer] with onset in his early 76s of sense of pnds /recurrent cough   referred to pulmonary clinic in [place]  03/04/2021 by Dr Woody Seller for recurrent cough       History of Present Illness  03/04/2021  Pulmonary/ 1st office eval/ Jason Woods / [facility]  Chief Complaint  Patient presents with   Consult    Bronchitis reoccurring coughing up yellow mucus. Has sinus problems. States this has been going for years.  Dyspnea:  was walking 76-4 miles per day before started having foot problems  Cough: better right now / worse in spring and fall  Sleep: on side/ bed is flat / no am cough  SABA use: some inhaler may have helped, not sure which inhaler, not sure what symptom was better.  No obvious day to day or daytime variability or ongoing assoc excess/ purulent sputum or mucus plugs or hemoptysis or cp or chest tightness, subjective wheeze or overt  hb symptoms.   Sleeping  without nocturnal  or early am exacerbation  of respiratory  c/o's or need for noct saba. Also denies any obvious fluctuation of symptoms with weather or environmental changes or other aggravating or alleviating factors except as outlined above   No unusual exposure hx or h/o childhood pna/ asthma or knowledge of premature birth.  Current Allergies, Complete Past Medical History, Past Surgical History, Family History, and Social History were reviewed in [employer] record.  ROS  The following are not active complaints unless bolded Hoarseness, sore throat/sense of pnds, dysphagia, dental problems, itching, sneezing,  nasal congestion or discharge of excess mucus /sense of pnds or purulent secretions, ear ache,   fever, chills, sweats, unintended wt loss or wt gain, classically pleuritic or exertional cp,  orthopnea pnd or arm/hand swelling  or leg swelling, presyncope,  palpitations, abdominal pain, anorexia, nausea, vomiting, diarrhea  or change in bowel habits or change in bladder habits, change in stools or change in urine, dysuria, hematuria,  rash, arthralgias, visual complaints, headache, numbness, weakness or ataxia or problems with walking or coordination,  change in mood or  memory.             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 30 tablet    b complex vitamins tablet Take 1 tablet by mouth daily. With C     bimatoprost (LUMIGAN) 0.01 % SOLN Place 1 drop into the right eye at bedtime.     buprenorphine (SUBUTEX) 8 MG SUBL SL tablet Place 8 mg  under the tongue 2 (two) times daily.   1   Cholecalciferol 50 MCG (2000 UT) TABS Take 2,000 Units by mouth daily.     cyanocobalamin 2000 MCG tablet Take 2,000 mcg by mouth daily.     docusate sodium (COLACE) 100 MG capsule Take 1 capsule (100 mg total) by mouth 2 (two) times daily. While taking narcotic pain medicine. 30 capsule 0   dorzolamide (TRUSOPT) 2 % ophthalmic solution Place 1 drop into both eyes 3 (three) times daily.   3   DULoxetine (CYMBALTA) 30 MG capsule Take 30 mg by mouth daily.   90   fluticasone (FLONASE) 50 MCG/ACT nasal spray Place 2 sprays into both nostrils daily.      insulin aspart (NOVOLOG FLEXPEN) 100  UNIT/ML FlexPen Novolog Flexpen U-100 Insulin aspart 100 unit/mL (3 mL) subcutaneous  INJECT INTO THE SKIN BEFORE MEALS PER SLIDING SCALE. MAX DOSE 30 UNITS PER DAY     insulin glargine (LANTUS) 100 UNIT/ML injection Inject 0.1-0.18 mLs (10-18 Units total) into the skin See admin instructions. Per sliding  16 units in the morning and 8-10 units in the evening     ketoconazole (NIZORAL) 2 % cream Apply 1 application topically daily.     linaclotide (LINZESS) 145 MCG CAPS capsule Take 145 mcg by mouth daily before breakfast.     metFORMIN (GLUCOPHAGE) 850 MG tablet Take 850 mg by mouth 2 (two) times daily with a meal.     metoprolol succinate (TOPROL-XL) 25 MG 24 hr tablet Take 25 mg by mouth daily.     Multiple Vitamin (MULTIVITAMIN WITH MINERALS) TABS tablet Take 1 tablet by mouth daily.     pantoprazole (PROTONIX) 40 MG tablet Take 40 mg by mouth daily.     promethazine (PHENERGAN) 25 MG tablet Take 0.5 tablets (12.5 mg total) by mouth 3 (three) times daily as needed. 30 tablet    senna (SENOKOT) 8.6 MG TABS tablet Take 2 tablets (17.2 mg total) by mouth 2 (two) times daily. 30 tablet 0   triamcinolone cream (KENALOG) 0.1 % Apply 1 application topically daily as needed (affected area(s) on skin).   5   No facility-administered medications prior to visit.     Objective:     BP 138/74 (BP Location: Left Arm, Patient Position: Sitting)   Pulse 72   Temp 98 F (36.7 C) (Temporal)   Ht 5\' 7"  (1.702 m)   Wt 174 lb (78.9 kg)   SpO2 98% Comment: ra  BMI 27.25 kg/m   SpO2: 98 % (ra)  Elderly somber wm/ smoker's rattle    HEENT : pt wearing mask not removed for exam due to covid - 19 concerns.    NECK :  without JVD/Nodes/TM/ nl carotid upstrokes bilaterally   LUNGS: no acc muscle use,  Mild barrel  contour chest wall with bilateral  Distant bs s audible wheeze and  without cough on insp or exp maneuvers  and mild  Hyperresonant  to  percussion bilaterally     CV:  RRR  no s3 or  murmur or increase in P2, and no edema   ABD:  soft and nontender with pos end  insp Hoover's  in the supine position. No bruits or organomegaly appreciated, bowel sounds nl  MS:   walking in R boot/ ext warm without deformities, calf tenderness, cyanosis or clubbing No obvious joint restrictions   SKIN: warm and dry without lesions    NEURO:  alert, approp, nl sensorium with  no  motor or cerebellar deficits apparent.       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 smoking  Active smoker - recurrent bronchitic features x his early 43s but limited by legs/ not breathing as of 1st pulmonary eval 03/04/2021      I reviewed the Fletcher curve with the patient that basically indicates  if you quit smoking when your best day FEV1 is still relatively  preserved (as may be  the case here)  it is highly unlikely you will progress to severe disease and informed the patient there was  no medication on the market that has proven to alter the curve/ its downward trajectory  or the likelihood of progression of their disease(unlike other chronic medical conditions such as atheroclerosis where we do think we can change the natural hx with risk reducing meds)    Therefore stopping smoking and maintaining abstinence are  the most important aspects of care, not choice of inhalers or for that matter, doctors.   Treatment other than smoking cessation  is entirely directed by severity of symptoms and focused also on reducing exacerbations, not attempting to change the natural history of the disease.    >>> He says the inhaler he was on seemed to help so fine to continue whatever it is and return here with it in hand if not happy with results    >>> to treat acute flares rec max gerd rx to see if reduces duration of rx or throat symptoms chronically   >>> if not then  ent eval may need to be considered but I suspect this is non-specific rhintis pnds mostly from smoking.      Cigarette smoker Counseled re importance of smoking cessation but did not meet time criteria for separate billing   Low-dose CT lung cancer screening is recommended for patients who are 21-58 years of age with a 20+ pack-year history of smoking, and who are currently smoking or quit <=15 years ago. No coughing up blood  No unintentional weight loss of > 15 pounds in the last 6 months   >> he is at the end of the recommended screening guidelines and I could not even get him to agree to a cxr today so defer this issue to PCP as pulmonary f/u will be prn   Each maintenance medication was reviewed in detail including emphasizing most importantly the difference between maintenance and prns and under what circumstances the prns are to be triggered using an action plan format where appropriate.  Total time for H and P, chart review, counseling, reviewing the various types of inhaler device(s) and generating customized AVS unique to this office visit / same day charting = 45 min        Christinia Gully, MD 03/04/2021

## 2021-03-04 NOTE — Patient Instructions (Addendum)
Protonix 40 mg should be Take 30-60 min before first meal of the day   If you start coughing worse for any reason I would rec you try taking a second dose of protonix 40 mg x  30 min before supper  or pepcid ac 20 mg after supper until cough is gone then resume in am only   GERD (REFLUX)  is an extremely common cause of respiratory symptoms just like yours , many times with no obvious heartburn at all.    It can be treated with medication, but also with lifestyle changes including elevation of the head of your bed (ideally with 6 -8inch blocks under the headboard of your bed),  Smoking cessation, avoidance of late meals, excessive alcohol, and avoid fatty foods, chocolate, peppermint, colas, red wine, and acidic juices such as orange juice.  NO MINT OR MENTHOL PRODUCTS SO NO COUGH DROPS  USE SUGARLESS CANDY INSTEAD (Jolley ranchers or Stover's or Life Savers) or even ice chips will also do - the key is to swallow to prevent all throat clearing. NO OIL BASED VITAMINS - use powdered substitutes.  Avoid fish oil when coughing.   Please remember to go to the  x-ray department  for your tests - we will call you with the results when they are available.   Pulmonary follow up is as needed but please bring all your medications but especially inhaler if not happy with it

## 2021-03-04 NOTE — Assessment & Plan Note (Signed)
Active smoker - recurrent bronchitic features x his early 16s but limited by legs/ not breathing as of 1st pulmonary eval 03/04/2021      I reviewed the Fletcher curve with the patient that basically indicates  if you quit smoking when your best day FEV1 is still relatively  preserved (as may be  the case here)  it is highly unlikely you will progress to severe disease and informed the patient there was  no medication on the market that has proven to alter the curve/ its downward trajectory  or the likelihood of progression of their disease(unlike other chronic medical conditions such as atheroclerosis where we do think we can change the natural hx with risk reducing meds)    Therefore stopping smoking and maintaining abstinence are  the most important aspects of care, not choice of inhalers or for that matter, doctors.   Treatment other than smoking cessation  is entirely directed by severity of symptoms and focused also on reducing exacerbations, not attempting to change the natural history of the disease.    >>> He says the inhaler he was on seemed to help so fine to continue whatever it is and return here with it in hand if not happy with results    >>> to treat acute flares rec max gerd rx to see if reduces duration of rx or throat symptoms chronically   >>> if not then ent eval may need to be considered but I suspect this is non-specific rhintis pnds mostly from smoking.

## 2021-03-05 ENCOUNTER — Encounter: Payer: Self-pay | Admitting: Internal Medicine

## 2021-03-05 DIAGNOSIS — M14671 Charcot's joint, right ankle and foot: Secondary | ICD-10-CM | POA: Diagnosis not present

## 2021-03-05 DIAGNOSIS — F17210 Nicotine dependence, cigarettes, uncomplicated: Secondary | ICD-10-CM | POA: Insufficient documentation

## 2021-03-05 NOTE — Assessment & Plan Note (Signed)
Counseled re importance of smoking cessation but did not meet time criteria for separate billing   Low-dose CT lung cancer screening is recommended for patients who are 28-76 years of age with a 20+ pack-year history of smoking, and who are currently smoking or quit <=15 years ago. No coughing up blood  No unintentional weight loss of > 15 pounds in the last 6 months   >> he is at the end of the recommended screening guidelines and I could not even get him to agree to a cxr today so defer this issue to PCP as pulmonary f/u will be prn          Each maintenance medication was reviewed in detail including emphasizing most importantly the difference between maintenance and prns and under what circumstances the prns are to be triggered using an action plan format where appropriate.  Total time for H and P, chart review, counseling, reviewing the various types of inhaler device(s) and generating customized AVS unique to this office visit / same day charting = 45 min

## 2021-03-11 DIAGNOSIS — R7989 Other specified abnormal findings of blood chemistry: Secondary | ICD-10-CM | POA: Diagnosis not present

## 2021-03-11 DIAGNOSIS — I10 Essential (primary) hypertension: Secondary | ICD-10-CM | POA: Diagnosis not present

## 2021-03-11 DIAGNOSIS — J069 Acute upper respiratory infection, unspecified: Secondary | ICD-10-CM | POA: Diagnosis not present

## 2021-03-11 DIAGNOSIS — Z299 Encounter for prophylactic measures, unspecified: Secondary | ICD-10-CM | POA: Diagnosis not present

## 2021-03-18 ENCOUNTER — Ambulatory Visit (HOSPITAL_COMMUNITY)
Admission: RE | Admit: 2021-03-18 | Discharge: 2021-03-18 | Disposition: A | Discharge status: Home / Self Care | Payer: Medicare Other | Source: Ambulatory Visit | Attending: Internal Medicine | Admitting: Internal Medicine

## 2021-03-18 ENCOUNTER — Other Ambulatory Visit: Payer: Self-pay

## 2021-03-18 DIAGNOSIS — R059 Cough, unspecified: Secondary | ICD-10-CM | POA: Diagnosis not present

## 2021-03-18 DIAGNOSIS — J449 Chronic obstructive pulmonary disease, unspecified: Secondary | ICD-10-CM | POA: Diagnosis not present

## 2021-03-20 DIAGNOSIS — E119 Type 2 diabetes mellitus without complications: Secondary | ICD-10-CM | POA: Diagnosis not present

## 2021-03-20 DIAGNOSIS — Z794 Long term (current) use of insulin: Secondary | ICD-10-CM | POA: Diagnosis not present

## 2021-03-21 DIAGNOSIS — E119 Type 2 diabetes mellitus without complications: Secondary | ICD-10-CM | POA: Diagnosis not present

## 2021-03-21 DIAGNOSIS — K219 Gastro-esophageal reflux disease without esophagitis: Secondary | ICD-10-CM | POA: Diagnosis not present

## 2021-03-26 DIAGNOSIS — I10 Essential (primary) hypertension: Secondary | ICD-10-CM | POA: Diagnosis not present

## 2021-03-26 DIAGNOSIS — C4492 Squamous cell carcinoma of skin, unspecified: Secondary | ICD-10-CM | POA: Diagnosis not present

## 2021-03-26 DIAGNOSIS — E1165 Type 2 diabetes mellitus with hyperglycemia: Secondary | ICD-10-CM | POA: Diagnosis not present

## 2021-03-26 DIAGNOSIS — Z299 Encounter for prophylactic measures, unspecified: Secondary | ICD-10-CM | POA: Diagnosis not present

## 2021-03-26 DIAGNOSIS — J440 Chronic obstructive pulmonary disease with acute lower respiratory infection: Secondary | ICD-10-CM | POA: Diagnosis not present

## 2021-03-26 DIAGNOSIS — Z794 Long term (current) use of insulin: Secondary | ICD-10-CM | POA: Diagnosis not present

## 2021-04-04 DIAGNOSIS — M14671 Charcot's joint, right ankle and foot: Secondary | ICD-10-CM | POA: Diagnosis not present

## 2021-04-10 DIAGNOSIS — M25559 Pain in unspecified hip: Secondary | ICD-10-CM | POA: Diagnosis not present

## 2021-04-10 DIAGNOSIS — M545 Low back pain, unspecified: Secondary | ICD-10-CM | POA: Diagnosis not present

## 2021-04-10 DIAGNOSIS — M130 Polyarthritis, unspecified: Secondary | ICD-10-CM | POA: Diagnosis not present

## 2021-04-10 DIAGNOSIS — M25532 Pain in left wrist: Secondary | ICD-10-CM | POA: Diagnosis not present

## 2021-04-10 DIAGNOSIS — M79662 Pain in left lower leg: Secondary | ICD-10-CM | POA: Diagnosis not present

## 2021-04-10 DIAGNOSIS — G5603 Carpal tunnel syndrome, bilateral upper limbs: Secondary | ICD-10-CM | POA: Diagnosis not present

## 2021-04-22 DIAGNOSIS — D692 Other nonthrombocytopenic purpura: Secondary | ICD-10-CM | POA: Diagnosis not present

## 2021-04-22 DIAGNOSIS — J069 Acute upper respiratory infection, unspecified: Secondary | ICD-10-CM | POA: Diagnosis not present

## 2021-04-22 DIAGNOSIS — Z299 Encounter for prophylactic measures, unspecified: Secondary | ICD-10-CM | POA: Diagnosis not present

## 2021-04-22 DIAGNOSIS — I700 Atherosclerosis of aorta: Secondary | ICD-10-CM | POA: Diagnosis not present

## 2021-04-22 DIAGNOSIS — I10 Essential (primary) hypertension: Secondary | ICD-10-CM | POA: Diagnosis not present

## 2021-04-22 DIAGNOSIS — F17210 Nicotine dependence, cigarettes, uncomplicated: Secondary | ICD-10-CM | POA: Diagnosis not present

## 2021-04-25 DIAGNOSIS — M14671 Charcot's joint, right ankle and foot: Secondary | ICD-10-CM | POA: Diagnosis not present

## 2021-04-25 DIAGNOSIS — E1169 Type 2 diabetes mellitus with other specified complication: Secondary | ICD-10-CM | POA: Diagnosis not present

## 2021-05-05 DIAGNOSIS — M14671 Charcot's joint, right ankle and foot: Secondary | ICD-10-CM | POA: Diagnosis not present

## 2021-05-07 DIAGNOSIS — I10 Essential (primary) hypertension: Secondary | ICD-10-CM | POA: Diagnosis not present

## 2021-05-07 DIAGNOSIS — E1142 Type 2 diabetes mellitus with diabetic polyneuropathy: Secondary | ICD-10-CM | POA: Diagnosis not present

## 2021-05-07 DIAGNOSIS — J069 Acute upper respiratory infection, unspecified: Secondary | ICD-10-CM | POA: Diagnosis not present

## 2021-05-07 DIAGNOSIS — Z299 Encounter for prophylactic measures, unspecified: Secondary | ICD-10-CM | POA: Diagnosis not present

## 2021-05-07 DIAGNOSIS — E1139 Type 2 diabetes mellitus with other diabetic ophthalmic complication: Secondary | ICD-10-CM | POA: Diagnosis not present

## 2021-05-07 DIAGNOSIS — E1165 Type 2 diabetes mellitus with hyperglycemia: Secondary | ICD-10-CM | POA: Diagnosis not present

## 2021-05-09 DIAGNOSIS — M79662 Pain in left lower leg: Secondary | ICD-10-CM | POA: Diagnosis not present

## 2021-05-09 DIAGNOSIS — M25532 Pain in left wrist: Secondary | ICD-10-CM | POA: Diagnosis not present

## 2021-05-09 DIAGNOSIS — G5603 Carpal tunnel syndrome, bilateral upper limbs: Secondary | ICD-10-CM | POA: Diagnosis not present

## 2021-05-09 DIAGNOSIS — Z79891 Long term (current) use of opiate analgesic: Secondary | ICD-10-CM | POA: Diagnosis not present

## 2021-05-09 DIAGNOSIS — M130 Polyarthritis, unspecified: Secondary | ICD-10-CM | POA: Diagnosis not present

## 2021-05-09 DIAGNOSIS — M545 Low back pain, unspecified: Secondary | ICD-10-CM | POA: Diagnosis not present

## 2021-05-09 DIAGNOSIS — M25559 Pain in unspecified hip: Secondary | ICD-10-CM | POA: Diagnosis not present

## 2021-05-15 DIAGNOSIS — R6889 Other general symptoms and signs: Secondary | ICD-10-CM | POA: Diagnosis not present

## 2021-05-15 DIAGNOSIS — I700 Atherosclerosis of aorta: Secondary | ICD-10-CM | POA: Diagnosis not present

## 2021-05-15 DIAGNOSIS — Z743 Need for continuous supervision: Secondary | ICD-10-CM | POA: Diagnosis not present

## 2021-05-15 DIAGNOSIS — R410 Disorientation, unspecified: Secondary | ICD-10-CM | POA: Diagnosis not present

## 2021-05-15 DIAGNOSIS — E119 Type 2 diabetes mellitus without complications: Secondary | ICD-10-CM | POA: Diagnosis not present

## 2021-05-15 DIAGNOSIS — R7881 Bacteremia: Secondary | ICD-10-CM | POA: Diagnosis not present

## 2021-05-15 DIAGNOSIS — R1111 Vomiting without nausea: Secondary | ICD-10-CM | POA: Diagnosis not present

## 2021-05-15 DIAGNOSIS — R0602 Shortness of breath: Secondary | ICD-10-CM | POA: Diagnosis not present

## 2021-05-15 DIAGNOSIS — E1165 Type 2 diabetes mellitus with hyperglycemia: Secondary | ICD-10-CM | POA: Diagnosis not present

## 2021-05-15 DIAGNOSIS — Z792 Long term (current) use of antibiotics: Secondary | ICD-10-CM | POA: Diagnosis not present

## 2021-05-15 DIAGNOSIS — Z20822 Contact with and (suspected) exposure to covid-19: Secondary | ICD-10-CM | POA: Diagnosis not present

## 2021-05-15 DIAGNOSIS — B9689 Other specified bacterial agents as the cause of diseases classified elsewhere: Secondary | ICD-10-CM | POA: Diagnosis not present

## 2021-05-15 DIAGNOSIS — I129 Hypertensive chronic kidney disease with stage 1 through stage 4 chronic kidney disease, or unspecified chronic kidney disease: Secondary | ICD-10-CM | POA: Diagnosis not present

## 2021-05-15 DIAGNOSIS — G9341 Metabolic encephalopathy: Secondary | ICD-10-CM | POA: Diagnosis not present

## 2021-05-15 DIAGNOSIS — E1122 Type 2 diabetes mellitus with diabetic chronic kidney disease: Secondary | ICD-10-CM | POA: Diagnosis not present

## 2021-05-15 DIAGNOSIS — Z7982 Long term (current) use of aspirin: Secondary | ICD-10-CM | POA: Diagnosis not present

## 2021-05-15 DIAGNOSIS — I5189 Other ill-defined heart diseases: Secondary | ICD-10-CM | POA: Diagnosis not present

## 2021-05-15 DIAGNOSIS — Z299 Encounter for prophylactic measures, unspecified: Secondary | ICD-10-CM | POA: Diagnosis not present

## 2021-05-15 DIAGNOSIS — Z87898 Personal history of other specified conditions: Secondary | ICD-10-CM | POA: Diagnosis not present

## 2021-05-15 DIAGNOSIS — R0902 Hypoxemia: Secondary | ICD-10-CM | POA: Diagnosis not present

## 2021-05-15 DIAGNOSIS — E876 Hypokalemia: Secondary | ICD-10-CM | POA: Diagnosis not present

## 2021-05-15 DIAGNOSIS — E871 Hypo-osmolality and hyponatremia: Secondary | ICD-10-CM | POA: Diagnosis not present

## 2021-05-15 DIAGNOSIS — I340 Nonrheumatic mitral (valve) insufficiency: Secondary | ICD-10-CM | POA: Diagnosis not present

## 2021-05-15 DIAGNOSIS — N1832 Chronic kidney disease, stage 3b: Secondary | ICD-10-CM | POA: Diagnosis not present

## 2021-05-15 DIAGNOSIS — Z794 Long term (current) use of insulin: Secondary | ICD-10-CM | POA: Diagnosis not present

## 2021-05-15 DIAGNOSIS — E860 Dehydration: Secondary | ICD-10-CM | POA: Diagnosis not present

## 2021-05-15 DIAGNOSIS — R531 Weakness: Secondary | ICD-10-CM | POA: Diagnosis not present

## 2021-05-15 DIAGNOSIS — S82892B Other fracture of left lower leg, initial encounter for open fracture type I or II: Secondary | ICD-10-CM | POA: Diagnosis not present

## 2021-05-15 DIAGNOSIS — I517 Cardiomegaly: Secondary | ICD-10-CM | POA: Diagnosis not present

## 2021-05-16 DIAGNOSIS — E876 Hypokalemia: Secondary | ICD-10-CM | POA: Diagnosis not present

## 2021-05-16 DIAGNOSIS — Z87898 Personal history of other specified conditions: Secondary | ICD-10-CM | POA: Diagnosis not present

## 2021-05-16 DIAGNOSIS — S82892B Other fracture of left lower leg, initial encounter for open fracture type I or II: Secondary | ICD-10-CM | POA: Diagnosis not present

## 2021-05-16 DIAGNOSIS — G9341 Metabolic encephalopathy: Secondary | ICD-10-CM | POA: Diagnosis not present

## 2021-05-16 DIAGNOSIS — R531 Weakness: Secondary | ICD-10-CM | POA: Diagnosis not present

## 2021-05-20 DIAGNOSIS — R7881 Bacteremia: Secondary | ICD-10-CM | POA: Diagnosis not present

## 2021-05-20 DIAGNOSIS — I340 Nonrheumatic mitral (valve) insufficiency: Secondary | ICD-10-CM | POA: Diagnosis not present

## 2021-05-20 DIAGNOSIS — I5189 Other ill-defined heart diseases: Secondary | ICD-10-CM | POA: Diagnosis not present

## 2021-05-20 DIAGNOSIS — I517 Cardiomegaly: Secondary | ICD-10-CM | POA: Diagnosis not present

## 2021-05-27 DIAGNOSIS — Z794 Long term (current) use of insulin: Secondary | ICD-10-CM | POA: Diagnosis not present

## 2021-05-27 DIAGNOSIS — E876 Hypokalemia: Secondary | ICD-10-CM | POA: Diagnosis not present

## 2021-05-27 DIAGNOSIS — I700 Atherosclerosis of aorta: Secondary | ICD-10-CM | POA: Diagnosis not present

## 2021-05-27 DIAGNOSIS — N1832 Chronic kidney disease, stage 3b: Secondary | ICD-10-CM | POA: Diagnosis not present

## 2021-05-27 DIAGNOSIS — E1122 Type 2 diabetes mellitus with diabetic chronic kidney disease: Secondary | ICD-10-CM | POA: Diagnosis not present

## 2021-05-27 DIAGNOSIS — E871 Hypo-osmolality and hyponatremia: Secondary | ICD-10-CM | POA: Diagnosis not present

## 2021-05-27 DIAGNOSIS — E860 Dehydration: Secondary | ICD-10-CM | POA: Diagnosis not present

## 2021-05-27 DIAGNOSIS — I10 Essential (primary) hypertension: Secondary | ICD-10-CM | POA: Diagnosis not present

## 2021-05-27 DIAGNOSIS — Z09 Encounter for follow-up examination after completed treatment for conditions other than malignant neoplasm: Secondary | ICD-10-CM | POA: Diagnosis not present

## 2021-05-27 DIAGNOSIS — Z9981 Dependence on supplemental oxygen: Secondary | ICD-10-CM | POA: Diagnosis not present

## 2021-05-27 DIAGNOSIS — J9611 Chronic respiratory failure with hypoxia: Secondary | ICD-10-CM | POA: Diagnosis not present

## 2021-06-02 DIAGNOSIS — N1832 Chronic kidney disease, stage 3b: Secondary | ICD-10-CM | POA: Diagnosis not present

## 2021-06-02 DIAGNOSIS — E1122 Type 2 diabetes mellitus with diabetic chronic kidney disease: Secondary | ICD-10-CM | POA: Diagnosis not present

## 2021-06-02 DIAGNOSIS — M14671 Charcot's joint, right ankle and foot: Secondary | ICD-10-CM | POA: Diagnosis not present

## 2021-06-02 DIAGNOSIS — E871 Hypo-osmolality and hyponatremia: Secondary | ICD-10-CM | POA: Diagnosis not present

## 2021-06-02 DIAGNOSIS — I700 Atherosclerosis of aorta: Secondary | ICD-10-CM | POA: Diagnosis not present

## 2021-06-02 DIAGNOSIS — Z794 Long term (current) use of insulin: Secondary | ICD-10-CM | POA: Diagnosis not present

## 2021-06-02 DIAGNOSIS — E876 Hypokalemia: Secondary | ICD-10-CM | POA: Diagnosis not present

## 2021-06-02 DIAGNOSIS — E860 Dehydration: Secondary | ICD-10-CM | POA: Diagnosis not present

## 2021-06-02 DIAGNOSIS — Z9981 Dependence on supplemental oxygen: Secondary | ICD-10-CM | POA: Diagnosis not present

## 2021-06-04 DIAGNOSIS — Z794 Long term (current) use of insulin: Secondary | ICD-10-CM | POA: Diagnosis not present

## 2021-06-04 DIAGNOSIS — E860 Dehydration: Secondary | ICD-10-CM | POA: Diagnosis not present

## 2021-06-04 DIAGNOSIS — Z9981 Dependence on supplemental oxygen: Secondary | ICD-10-CM | POA: Diagnosis not present

## 2021-06-04 DIAGNOSIS — E1122 Type 2 diabetes mellitus with diabetic chronic kidney disease: Secondary | ICD-10-CM | POA: Diagnosis not present

## 2021-06-04 DIAGNOSIS — I700 Atherosclerosis of aorta: Secondary | ICD-10-CM | POA: Diagnosis not present

## 2021-06-04 DIAGNOSIS — N1832 Chronic kidney disease, stage 3b: Secondary | ICD-10-CM | POA: Diagnosis not present

## 2021-06-04 DIAGNOSIS — E876 Hypokalemia: Secondary | ICD-10-CM | POA: Diagnosis not present

## 2021-06-04 DIAGNOSIS — E871 Hypo-osmolality and hyponatremia: Secondary | ICD-10-CM | POA: Diagnosis not present

## 2021-06-09 DIAGNOSIS — N1832 Chronic kidney disease, stage 3b: Secondary | ICD-10-CM | POA: Diagnosis not present

## 2021-06-09 DIAGNOSIS — Z794 Long term (current) use of insulin: Secondary | ICD-10-CM | POA: Diagnosis not present

## 2021-06-09 DIAGNOSIS — E1122 Type 2 diabetes mellitus with diabetic chronic kidney disease: Secondary | ICD-10-CM | POA: Diagnosis not present

## 2021-06-09 DIAGNOSIS — E876 Hypokalemia: Secondary | ICD-10-CM | POA: Diagnosis not present

## 2021-06-09 DIAGNOSIS — E871 Hypo-osmolality and hyponatremia: Secondary | ICD-10-CM | POA: Diagnosis not present

## 2021-06-09 DIAGNOSIS — I700 Atherosclerosis of aorta: Secondary | ICD-10-CM | POA: Diagnosis not present

## 2021-06-09 DIAGNOSIS — E860 Dehydration: Secondary | ICD-10-CM | POA: Diagnosis not present

## 2021-06-09 DIAGNOSIS — Z9981 Dependence on supplemental oxygen: Secondary | ICD-10-CM | POA: Diagnosis not present

## 2021-06-11 DIAGNOSIS — E876 Hypokalemia: Secondary | ICD-10-CM | POA: Diagnosis not present

## 2021-06-11 DIAGNOSIS — E871 Hypo-osmolality and hyponatremia: Secondary | ICD-10-CM | POA: Diagnosis not present

## 2021-06-11 DIAGNOSIS — E860 Dehydration: Secondary | ICD-10-CM | POA: Diagnosis not present

## 2021-06-12 DIAGNOSIS — Z7189 Other specified counseling: Secondary | ICD-10-CM | POA: Diagnosis not present

## 2021-06-12 DIAGNOSIS — Z0000 Encounter for general adult medical examination without abnormal findings: Secondary | ICD-10-CM | POA: Diagnosis not present

## 2021-06-12 DIAGNOSIS — Z794 Long term (current) use of insulin: Secondary | ICD-10-CM | POA: Diagnosis not present

## 2021-06-12 DIAGNOSIS — Z299 Encounter for prophylactic measures, unspecified: Secondary | ICD-10-CM | POA: Diagnosis not present

## 2021-06-12 DIAGNOSIS — E860 Dehydration: Secondary | ICD-10-CM | POA: Diagnosis not present

## 2021-06-12 DIAGNOSIS — E871 Hypo-osmolality and hyponatremia: Secondary | ICD-10-CM | POA: Diagnosis not present

## 2021-06-12 DIAGNOSIS — Z789 Other specified health status: Secondary | ICD-10-CM | POA: Diagnosis not present

## 2021-06-12 DIAGNOSIS — I10 Essential (primary) hypertension: Secondary | ICD-10-CM | POA: Diagnosis not present

## 2021-06-12 DIAGNOSIS — Z9981 Dependence on supplemental oxygen: Secondary | ICD-10-CM | POA: Diagnosis not present

## 2021-06-12 DIAGNOSIS — E876 Hypokalemia: Secondary | ICD-10-CM | POA: Diagnosis not present

## 2021-06-12 DIAGNOSIS — I700 Atherosclerosis of aorta: Secondary | ICD-10-CM | POA: Diagnosis not present

## 2021-06-12 DIAGNOSIS — E1122 Type 2 diabetes mellitus with diabetic chronic kidney disease: Secondary | ICD-10-CM | POA: Diagnosis not present

## 2021-06-12 DIAGNOSIS — N1832 Chronic kidney disease, stage 3b: Secondary | ICD-10-CM | POA: Diagnosis not present

## 2021-06-17 DIAGNOSIS — N1832 Chronic kidney disease, stage 3b: Secondary | ICD-10-CM | POA: Diagnosis not present

## 2021-06-17 DIAGNOSIS — I700 Atherosclerosis of aorta: Secondary | ICD-10-CM | POA: Diagnosis not present

## 2021-06-17 DIAGNOSIS — Z794 Long term (current) use of insulin: Secondary | ICD-10-CM | POA: Diagnosis not present

## 2021-06-17 DIAGNOSIS — E860 Dehydration: Secondary | ICD-10-CM | POA: Diagnosis not present

## 2021-06-17 DIAGNOSIS — Z9981 Dependence on supplemental oxygen: Secondary | ICD-10-CM | POA: Diagnosis not present

## 2021-06-17 DIAGNOSIS — E871 Hypo-osmolality and hyponatremia: Secondary | ICD-10-CM | POA: Diagnosis not present

## 2021-06-17 DIAGNOSIS — E876 Hypokalemia: Secondary | ICD-10-CM | POA: Diagnosis not present

## 2021-06-17 DIAGNOSIS — E1122 Type 2 diabetes mellitus with diabetic chronic kidney disease: Secondary | ICD-10-CM | POA: Diagnosis not present

## 2021-06-19 DIAGNOSIS — K219 Gastro-esophageal reflux disease without esophagitis: Secondary | ICD-10-CM | POA: Diagnosis not present

## 2021-06-19 DIAGNOSIS — E119 Type 2 diabetes mellitus without complications: Secondary | ICD-10-CM | POA: Diagnosis not present

## 2021-06-20 DIAGNOSIS — E860 Dehydration: Secondary | ICD-10-CM | POA: Diagnosis not present

## 2021-06-20 DIAGNOSIS — E11621 Type 2 diabetes mellitus with foot ulcer: Secondary | ICD-10-CM | POA: Diagnosis not present

## 2021-06-20 DIAGNOSIS — Z23 Encounter for immunization: Secondary | ICD-10-CM | POA: Diagnosis not present

## 2021-06-20 DIAGNOSIS — E1122 Type 2 diabetes mellitus with diabetic chronic kidney disease: Secondary | ICD-10-CM | POA: Diagnosis not present

## 2021-06-20 DIAGNOSIS — M79674 Pain in right toe(s): Secondary | ICD-10-CM | POA: Diagnosis not present

## 2021-06-20 DIAGNOSIS — S99921A Unspecified injury of right foot, initial encounter: Secondary | ICD-10-CM | POA: Diagnosis not present

## 2021-06-20 DIAGNOSIS — Z888 Allergy status to other drugs, medicaments and biological substances status: Secondary | ICD-10-CM | POA: Diagnosis not present

## 2021-06-20 DIAGNOSIS — Z7982 Long term (current) use of aspirin: Secondary | ICD-10-CM | POA: Diagnosis not present

## 2021-06-20 DIAGNOSIS — E119 Type 2 diabetes mellitus without complications: Secondary | ICD-10-CM | POA: Diagnosis not present

## 2021-06-20 DIAGNOSIS — E876 Hypokalemia: Secondary | ICD-10-CM | POA: Diagnosis not present

## 2021-06-20 DIAGNOSIS — S91101A Unspecified open wound of right great toe without damage to nail, initial encounter: Secondary | ICD-10-CM | POA: Diagnosis not present

## 2021-06-20 DIAGNOSIS — I700 Atherosclerosis of aorta: Secondary | ICD-10-CM | POA: Diagnosis not present

## 2021-06-20 DIAGNOSIS — X58XXXA Exposure to other specified factors, initial encounter: Secondary | ICD-10-CM | POA: Diagnosis not present

## 2021-06-20 DIAGNOSIS — N1832 Chronic kidney disease, stage 3b: Secondary | ICD-10-CM | POA: Diagnosis not present

## 2021-06-20 DIAGNOSIS — F17210 Nicotine dependence, cigarettes, uncomplicated: Secondary | ICD-10-CM | POA: Diagnosis not present

## 2021-06-20 DIAGNOSIS — I10 Essential (primary) hypertension: Secondary | ICD-10-CM | POA: Diagnosis not present

## 2021-06-20 DIAGNOSIS — E871 Hypo-osmolality and hyponatremia: Secondary | ICD-10-CM | POA: Diagnosis not present

## 2021-06-20 DIAGNOSIS — Z79899 Other long term (current) drug therapy: Secondary | ICD-10-CM | POA: Diagnosis not present

## 2021-06-20 DIAGNOSIS — Z794 Long term (current) use of insulin: Secondary | ICD-10-CM | POA: Diagnosis not present

## 2021-06-20 DIAGNOSIS — S91201A Unspecified open wound of right great toe with damage to nail, initial encounter: Secondary | ICD-10-CM | POA: Diagnosis not present

## 2021-06-20 DIAGNOSIS — Z886 Allergy status to analgesic agent status: Secondary | ICD-10-CM | POA: Diagnosis not present

## 2021-06-20 DIAGNOSIS — Z9981 Dependence on supplemental oxygen: Secondary | ICD-10-CM | POA: Diagnosis not present

## 2021-06-21 DIAGNOSIS — R531 Weakness: Secondary | ICD-10-CM | POA: Diagnosis not present

## 2021-06-21 DIAGNOSIS — E876 Hypokalemia: Secondary | ICD-10-CM | POA: Diagnosis not present

## 2021-06-21 DIAGNOSIS — G9341 Metabolic encephalopathy: Secondary | ICD-10-CM | POA: Diagnosis not present

## 2021-06-21 DIAGNOSIS — S82892B Other fracture of left lower leg, initial encounter for open fracture type I or II: Secondary | ICD-10-CM | POA: Diagnosis not present

## 2021-06-23 DIAGNOSIS — Z299 Encounter for prophylactic measures, unspecified: Secondary | ICD-10-CM | POA: Diagnosis not present

## 2021-06-23 DIAGNOSIS — I10 Essential (primary) hypertension: Secondary | ICD-10-CM | POA: Diagnosis not present

## 2021-06-23 DIAGNOSIS — S99921S Unspecified injury of right foot, sequela: Secondary | ICD-10-CM | POA: Diagnosis not present

## 2021-06-25 DIAGNOSIS — E876 Hypokalemia: Secondary | ICD-10-CM | POA: Diagnosis not present

## 2021-06-25 DIAGNOSIS — N1832 Chronic kidney disease, stage 3b: Secondary | ICD-10-CM | POA: Diagnosis not present

## 2021-06-25 DIAGNOSIS — M79671 Pain in right foot: Secondary | ICD-10-CM | POA: Diagnosis not present

## 2021-06-25 DIAGNOSIS — E1140 Type 2 diabetes mellitus with diabetic neuropathy, unspecified: Secondary | ICD-10-CM | POA: Diagnosis not present

## 2021-06-25 DIAGNOSIS — E860 Dehydration: Secondary | ICD-10-CM | POA: Diagnosis not present

## 2021-06-25 DIAGNOSIS — S90111A Contusion of right great toe without damage to nail, initial encounter: Secondary | ICD-10-CM | POA: Diagnosis not present

## 2021-06-25 DIAGNOSIS — I700 Atherosclerosis of aorta: Secondary | ICD-10-CM | POA: Diagnosis not present

## 2021-06-25 DIAGNOSIS — M79674 Pain in right toe(s): Secondary | ICD-10-CM | POA: Diagnosis not present

## 2021-06-25 DIAGNOSIS — E1122 Type 2 diabetes mellitus with diabetic chronic kidney disease: Secondary | ICD-10-CM | POA: Diagnosis not present

## 2021-06-25 DIAGNOSIS — Z794 Long term (current) use of insulin: Secondary | ICD-10-CM | POA: Diagnosis not present

## 2021-06-25 DIAGNOSIS — E871 Hypo-osmolality and hyponatremia: Secondary | ICD-10-CM | POA: Diagnosis not present

## 2021-06-25 DIAGNOSIS — Z9981 Dependence on supplemental oxygen: Secondary | ICD-10-CM | POA: Diagnosis not present

## 2021-07-01 DIAGNOSIS — L309 Dermatitis, unspecified: Secondary | ICD-10-CM | POA: Diagnosis not present

## 2021-07-01 DIAGNOSIS — L570 Actinic keratosis: Secondary | ICD-10-CM | POA: Diagnosis not present

## 2021-07-03 DIAGNOSIS — M545 Low back pain, unspecified: Secondary | ICD-10-CM | POA: Diagnosis not present

## 2021-07-03 DIAGNOSIS — M5010 Cervical disc disorder with radiculopathy, unspecified cervical region: Secondary | ICD-10-CM | POA: Diagnosis not present

## 2021-07-03 DIAGNOSIS — E876 Hypokalemia: Secondary | ICD-10-CM | POA: Diagnosis not present

## 2021-07-03 DIAGNOSIS — Z794 Long term (current) use of insulin: Secondary | ICD-10-CM | POA: Diagnosis not present

## 2021-07-03 DIAGNOSIS — N1832 Chronic kidney disease, stage 3b: Secondary | ICD-10-CM | POA: Diagnosis not present

## 2021-07-03 DIAGNOSIS — M25532 Pain in left wrist: Secondary | ICD-10-CM | POA: Diagnosis not present

## 2021-07-03 DIAGNOSIS — Z9981 Dependence on supplemental oxygen: Secondary | ICD-10-CM | POA: Diagnosis not present

## 2021-07-03 DIAGNOSIS — M79662 Pain in left lower leg: Secondary | ICD-10-CM | POA: Diagnosis not present

## 2021-07-03 DIAGNOSIS — Z79891 Long term (current) use of opiate analgesic: Secondary | ICD-10-CM | POA: Diagnosis not present

## 2021-07-03 DIAGNOSIS — M14671 Charcot's joint, right ankle and foot: Secondary | ICD-10-CM | POA: Diagnosis not present

## 2021-07-03 DIAGNOSIS — M25559 Pain in unspecified hip: Secondary | ICD-10-CM | POA: Diagnosis not present

## 2021-07-03 DIAGNOSIS — E871 Hypo-osmolality and hyponatremia: Secondary | ICD-10-CM | POA: Diagnosis not present

## 2021-07-03 DIAGNOSIS — E1122 Type 2 diabetes mellitus with diabetic chronic kidney disease: Secondary | ICD-10-CM | POA: Diagnosis not present

## 2021-07-03 DIAGNOSIS — E860 Dehydration: Secondary | ICD-10-CM | POA: Diagnosis not present

## 2021-07-03 DIAGNOSIS — I700 Atherosclerosis of aorta: Secondary | ICD-10-CM | POA: Diagnosis not present

## 2021-07-07 DIAGNOSIS — F17210 Nicotine dependence, cigarettes, uncomplicated: Secondary | ICD-10-CM | POA: Diagnosis not present

## 2021-07-07 DIAGNOSIS — J449 Chronic obstructive pulmonary disease, unspecified: Secondary | ICD-10-CM | POA: Diagnosis not present

## 2021-07-07 DIAGNOSIS — Z299 Encounter for prophylactic measures, unspecified: Secondary | ICD-10-CM | POA: Diagnosis not present

## 2021-07-07 DIAGNOSIS — J069 Acute upper respiratory infection, unspecified: Secondary | ICD-10-CM | POA: Diagnosis not present

## 2021-07-07 DIAGNOSIS — I10 Essential (primary) hypertension: Secondary | ICD-10-CM | POA: Diagnosis not present

## 2021-07-08 DIAGNOSIS — R0602 Shortness of breath: Secondary | ICD-10-CM | POA: Diagnosis not present

## 2021-07-08 DIAGNOSIS — R0989 Other specified symptoms and signs involving the circulatory and respiratory systems: Secondary | ICD-10-CM | POA: Diagnosis not present

## 2021-07-11 DIAGNOSIS — N1832 Chronic kidney disease, stage 3b: Secondary | ICD-10-CM | POA: Diagnosis not present

## 2021-07-11 DIAGNOSIS — E876 Hypokalemia: Secondary | ICD-10-CM | POA: Diagnosis not present

## 2021-07-11 DIAGNOSIS — Z794 Long term (current) use of insulin: Secondary | ICD-10-CM | POA: Diagnosis not present

## 2021-07-11 DIAGNOSIS — E1122 Type 2 diabetes mellitus with diabetic chronic kidney disease: Secondary | ICD-10-CM | POA: Diagnosis not present

## 2021-07-11 DIAGNOSIS — E860 Dehydration: Secondary | ICD-10-CM | POA: Diagnosis not present

## 2021-07-11 DIAGNOSIS — E871 Hypo-osmolality and hyponatremia: Secondary | ICD-10-CM | POA: Diagnosis not present

## 2021-07-11 DIAGNOSIS — I700 Atherosclerosis of aorta: Secondary | ICD-10-CM | POA: Diagnosis not present

## 2021-07-11 DIAGNOSIS — Z9981 Dependence on supplemental oxygen: Secondary | ICD-10-CM | POA: Diagnosis not present

## 2021-07-15 ENCOUNTER — Emergency Department (HOSPITAL_COMMUNITY)
Admission: EM | Admit: 2021-07-15 | Discharge: 2021-07-15 | Disposition: A | Discharge status: Home / Self Care | Payer: Medicare Other | Attending: Emergency Medicine | Admitting: Emergency Medicine

## 2021-07-15 ENCOUNTER — Other Ambulatory Visit: Payer: Self-pay

## 2021-07-15 ENCOUNTER — Encounter (HOSPITAL_COMMUNITY): Payer: Self-pay

## 2021-07-15 ENCOUNTER — Emergency Department (HOSPITAL_COMMUNITY): Payer: Medicare Other

## 2021-07-15 DIAGNOSIS — R0602 Shortness of breath: Secondary | ICD-10-CM | POA: Diagnosis not present

## 2021-07-15 DIAGNOSIS — J449 Chronic obstructive pulmonary disease, unspecified: Secondary | ICD-10-CM | POA: Diagnosis not present

## 2021-07-15 DIAGNOSIS — R911 Solitary pulmonary nodule: Secondary | ICD-10-CM | POA: Diagnosis not present

## 2021-07-15 DIAGNOSIS — R052 Subacute cough: Secondary | ICD-10-CM

## 2021-07-15 DIAGNOSIS — Z794 Long term (current) use of insulin: Secondary | ICD-10-CM | POA: Insufficient documentation

## 2021-07-15 DIAGNOSIS — R059 Cough, unspecified: Secondary | ICD-10-CM | POA: Diagnosis not present

## 2021-07-15 DIAGNOSIS — J441 Chronic obstructive pulmonary disease with (acute) exacerbation: Secondary | ICD-10-CM | POA: Diagnosis not present

## 2021-07-15 DIAGNOSIS — I10 Essential (primary) hypertension: Secondary | ICD-10-CM | POA: Diagnosis not present

## 2021-07-15 DIAGNOSIS — Z79899 Other long term (current) drug therapy: Secondary | ICD-10-CM | POA: Diagnosis not present

## 2021-07-15 DIAGNOSIS — E119 Type 2 diabetes mellitus without complications: Secondary | ICD-10-CM | POA: Insufficient documentation

## 2021-07-15 LAB — CBC WITH DIFFERENTIAL/PLATELET
Abs Immature Granulocytes: 0.03 10*3/uL (ref 0.00–0.07)
Basophils Absolute: 0.1 10*3/uL (ref 0.0–0.1)
Basophils Relative: 1 %
Eosinophils Absolute: 0.1 10*3/uL (ref 0.0–0.5)
Eosinophils Relative: 2 %
HCT: 39.5 % (ref 39.0–52.0)
Hemoglobin: 13.3 g/dL (ref 13.0–17.0)
Immature Granulocytes: 0 %
Lymphocytes Relative: 22 %
Lymphs Abs: 1.7 10*3/uL (ref 0.7–4.0)
MCH: 30.9 pg (ref 26.0–34.0)
MCHC: 33.7 g/dL (ref 30.0–36.0)
MCV: 91.6 fL (ref 80.0–100.0)
Monocytes Absolute: 0.5 10*3/uL (ref 0.1–1.0)
Monocytes Relative: 6 %
Neutro Abs: 5.4 10*3/uL (ref 1.7–7.7)
Neutrophils Relative %: 69 %
Platelets: 308 10*3/uL (ref 150–400)
RBC: 4.31 MIL/uL (ref 4.22–5.81)
RDW: 13.3 % (ref 11.5–15.5)
WBC: 7.7 10*3/uL (ref 4.0–10.5)
nRBC: 0 % (ref 0.0–0.2)

## 2021-07-15 LAB — COMPREHENSIVE METABOLIC PANEL
ALT: 11 U/L (ref 0–44)
AST: 14 U/L — ABNORMAL LOW (ref 15–41)
Albumin: 4 g/dL (ref 3.5–5.0)
Alkaline Phosphatase: 79 U/L (ref 38–126)
Anion gap: 7 (ref 5–15)
BUN: 15 mg/dL (ref 8–23)
CO2: 32 mmol/L (ref 22–32)
Calcium: 8.6 mg/dL — ABNORMAL LOW (ref 8.9–10.3)
Chloride: 96 mmol/L — ABNORMAL LOW (ref 98–111)
Creatinine, Ser: 1.02 mg/dL (ref 0.61–1.24)
GFR, Estimated: >60 mL/min (ref >60)
Glucose, Bld: 82 mg/dL (ref 70–99)
Potassium: 2.9 mmol/L — ABNORMAL LOW (ref 3.5–5.1)
Sodium: 135 mmol/L (ref 135–145)
Total Bilirubin: 0.7 mg/dL (ref 0.3–1.2)
Total Protein: 7.2 g/dL (ref 6.5–8.1)

## 2021-07-15 MED ORDER — POTASSIUM CHLORIDE CRYS ER 20 MEQ PO TBCR
40.0000 meq | EXTENDED_RELEASE_TABLET | Freq: Once | ORAL | Status: AC
  Administered 2021-07-15: 40 meq via ORAL
  Filled 2021-07-15: qty 2

## 2021-07-15 MED ORDER — IPRATROPIUM-ALBUTEROL 0.5-2.5 (3) MG/3ML IN SOLN
3.0000 mL | RESPIRATORY_TRACT | Status: AC
  Administered 2021-07-15 (×3): 3 mL via RESPIRATORY_TRACT
  Filled 2021-07-15: qty 9

## 2021-07-15 MED ORDER — ALBUTEROL SULFATE HFA 108 (90 BASE) MCG/ACT IN AERS: 1.0000 | INHALATION_SPRAY | Freq: Four times a day (QID) | RESPIRATORY_TRACT | 0 refills | Status: AC | PRN

## 2021-07-15 MED ORDER — PREDNISONE 10 MG PO TABS: 10.0000 mg | ORAL_TABLET | Freq: Every day | ORAL | 0 refills | Status: AC

## 2021-07-15 MED ORDER — METHYLPREDNISOLONE SODIUM SUCC 125 MG IJ SOLR
125.0000 mg | Freq: Once | INTRAMUSCULAR | Status: AC
  Administered 2021-07-15: 125 mg via INTRAVENOUS
  Filled 2021-07-15: qty 2

## 2021-07-15 MED ORDER — IOHEXOL 350 MG/ML SOLN
100.0000 mL | Freq: Once | INTRAVENOUS | Status: AC | PRN
Start: 2021-07-15 — End: 2021-07-15
  Administered 2021-07-15: 80 mL via INTRAVENOUS

## 2021-07-15 NOTE — ED Notes (Signed)
ED Provider at bedside. 

## 2021-07-15 NOTE — ED Notes (Signed)
Respiratory aware of neb order and will administer.  ?

## 2021-07-15 NOTE — ED Triage Notes (Signed)
Patient with complaints of cough and congestion for over a month was admitted for pneumonia for 7 days at [facility] and was discharged 2 weeks ago.  ?

## 2021-07-15 NOTE — ED Notes (Signed)
Patient transported to X-ray 

## 2021-07-15 NOTE — ED Notes (Signed)
While ambulating HR remains at 97. O2 remains at 97  ?

## 2021-07-15 NOTE — Discharge Instructions (Addendum)
As we discussed, your work-up in the ER today today was reassuring for acute abnormalities.  I have given you 2 prescriptions for continued management of your symptoms.  Please fill these prescriptions and take as prescribed.  Please call your primary care doctor to follow-up with them in the next few days for continued evaluation and management of your symptoms. ? ?Return if development of any new or worsening symptoms. ?

## 2021-07-15 NOTE — ED Notes (Signed)
Pt wheeled to lobby.  Verbalized understanding discharge instructions, prescriptions, and follow-up. In no acute distress.  ? ?

## 2021-07-15 NOTE — ED Notes (Signed)
Pt noted to be eating crackers and drinking tea.  Pt had been asked by Radiology to hold off until CT resulted, but Pt continued to eat and drink.  Shortly after, this writer asked Pt to stop eating and explained the "why."  Pt verbalized understanding and stopped eating.  ?

## 2021-07-15 NOTE — ED Provider Notes (Signed)
?[place] ?Provider Note ? ? ?CSN: [id number] ?Arrival date & time: 07/15/21  4193 ? ?  ? ?History ? ?Chief Complaint  ?Patient presents with  ? Cough  ? ? ?Jason Woods is a 76 y.o. male. ? ?Patient with history of hypertension, hyperlipidemia, GERD presents today with complaints of shortness of breath. He states that same has been going on for several weeks now, was admitted to [facility] for several days for pneumonia and shortness of breath and placed on IV antibiotics and discharged 2 weeks ago. He states that he never really improved and his symptoms have been worsening since he was discharged. He states that he went home with oxygen to wear at night and has been having to wear it more frequently due to shortness of breath. He also states that he is still having a productive cough and feels to weak to be able to expel his sputum due to coughing for several weeks straight. He denies any fevers, chills, chest pain, nausea, vomiting, or diarrhea. Patient also denies any history of COPD, however states that he smokes 1 pack/day for 'my whole life' and has continued to do so despite being short of breath. Also endorses leg pain, however states that is a chronic issue given that he had an ankle fracture with surgical repair 2 years ago. States that he has been required to wear a plastic below the knee brace for 1 year for management of this fracture. He lives at home alone and ambulates normally with a walker. ? ?The history is provided by the patient. No language interpreter was used.  ?Cough ?Associated symptoms: shortness of breath and wheezing   ?Associated symptoms: no chest pain, no chills and no fever   ? ?  ? ?Home Medications ?Prior to Admission medications   ?Medication Sig Start Date End Date Taking? Authorizing Provider  ?ALPRAZolam (XANAX) 1 MG tablet Take 0.5 tablets (0.5 mg total) by mouth 3 (three) times daily. 09/15/20   Johnson, Clanford L, MD  ?b complex vitamins  tablet Take 1 tablet by mouth daily. With C    [provider]  ?bimatoprost (LUMIGAN) 0.01 % SOLN Place 1 drop into the right eye at bedtime.    [provider]  ?buprenorphine (SUBUTEX) 8 MG SUBL SL tablet Place 8 mg under the tongue 2 (two) times daily.  09/29/17   [provider]  ?Cholecalciferol 50 MCG (2000 UT) TABS Take 2,000 Units by mouth daily.    [provider]  ?cyanocobalamin 2000 MCG tablet Take 2,000 mcg by mouth daily.    [provider]  ?docusate sodium (COLACE) 100 MG capsule Take 1 capsule (100 mg total) by mouth 2 (two) times daily. While taking narcotic pain medicine. 12/06/18   Corky Sing, PA-C  ?dorzolamide (TRUSOPT) 2 % ophthalmic solution Place 1 drop into both eyes 3 (three) times daily.  04/15/17   [provider]  ?DULoxetine (CYMBALTA) 30 MG capsule Take 30 mg by mouth daily.  10/11/17   [provider]  ?fluticasone (FLONASE) 50 MCG/ACT nasal spray Place 2 sprays into both nostrils daily.     [provider]  ?insulin aspart (NOVOLOG FLEXPEN) 100 UNIT/ML FlexPen Novolog Flexpen U-100 Insulin aspart 100 unit/mL (3 mL) subcutaneous ? INJECT INTO THE SKIN BEFORE MEALS PER SLIDING SCALE. MAX DOSE 30 UNITS PER DAY    [provider]  ?insulin glargine (LANTUS) 100 UNIT/ML injection Inject 0.1-0.18 mLs (10-18 Units total) into the skin See  admin instructions. Per sliding  ?16 units in the morning and 8-10 units in the evening 09/15/20   Johnson, Clanford L, MD  ?ketoconazole (NIZORAL) 2 % cream Apply 1 application topically daily.    [provider]  ?linaclotide (LINZESS) 145 MCG CAPS capsule Take 145 mcg by mouth daily before breakfast.    [provider]  ?metFORMIN (GLUCOPHAGE) 850 MG tablet Take 850 mg by mouth 2 (two) times daily with a meal.    [provider]  ?metoprolol succinate (TOPROL-XL) 25 MG 24 hr tablet Take 25 mg by mouth daily. 06/28/18   [provider]   ?Multiple Vitamin (MULTIVITAMIN WITH MINERALS) TABS tablet Take 1 tablet by mouth daily.    [provider]  ?pantoprazole (PROTONIX) 40 MG tablet Take 40 mg by mouth daily.    [provider]  ?promethazine (PHENERGAN) 25 MG tablet Take 0.5 tablets (12.5 mg total) by mouth 3 (three) times daily as needed. 09/15/20   Johnson, Clanford L, MD  ?senna (SENOKOT) 8.6 MG TABS tablet Take 2 tablets (17.2 mg total) by mouth 2 (two) times daily. 12/06/18   Corky Sing, PA-C  ?triamcinolone cream (KENALOG) 0.1 % Apply 1 application topically daily as needed (affected area(s) on skin).  04/15/17   [provider]  ?gabapentin (NEURONTIN) 400 MG capsule Take 400 mg by mouth 2 (two) times daily.   09/15/20  [provider]  ?losartan (COZAAR) 25 MG tablet Take 1 tablet (25 mg total) by mouth daily. 10/08/17 09/15/20  Velna Ochs, MD  ?   ? ?Allergies    ?Lipitor [atorvastatin], Zocor [simvastatin-high dose], Celexa [citalopram hydrobromide], Crestor [rosuvastatin], Mobic [meloxicam], and Prozac [fluoxetine hcl]   ? ?Review of Systems   ?Review of Systems  ?Constitutional:  Negative for chills and fever.  ?Respiratory:  Positive for cough, shortness of breath and wheezing.   ?Cardiovascular:  Negative for chest pain and leg swelling.  ?All other systems reviewed and are negative. ? ?Physical Exam ?Updated Vital Signs ?BP (!) 182/87   Pulse 72   Temp 98.1 ?F (36.7 ?C) (Oral)   Resp 19   Ht '5\' 7"'$  (1.702 m)   Wt 72.6 kg   SpO2 97%   BMI 25.06 kg/m?  ?Physical Exam ?Vitals and nursing note reviewed.  ?Constitutional:   ?   General: He is not in acute distress. ?   Appearance: Normal appearance. He is normal weight. He is not ill-appearing, toxic-appearing or diaphoretic.  ?HENT:  ?   Head: Normocephalic and atraumatic.  ?Eyes:  ?   Extraocular Movements: Extraocular movements intact.  ?   Pupils: Pupils are equal, round, and reactive to light.  ?Cardiovascular:  ?   Rate and Rhythm:  Normal rate and regular rhythm.  ?   Heart sounds: Normal heart sounds.  ?Pulmonary:  ?   Effort: Pulmonary effort is normal. No respiratory distress.  ?   Breath sounds: Wheezing present.  ?Abdominal:  ?   General: Abdomen is flat.  ?   Palpations: Abdomen is soft.  ?   Tenderness: There is no abdominal tenderness.  ?Musculoskeletal:     ?   General: Normal range of motion.  ?   Cervical back: Normal range of motion.  ?Skin: ?   General: Skin is warm and dry.  ?Neurological:  ?   General: No focal deficit present.  ?   Mental Status: He is alert.  ?Psychiatric:     ?   Mood and Affect: Mood  normal.     ?   Behavior: Behavior normal.  ? ? ?ED Results / Procedures / Treatments   ?Labs ?(all labs ordered are listed, but only abnormal results are displayed) ?Labs Reviewed  ?CBC WITH DIFFERENTIAL/PLATELET  ?COMPREHENSIVE METABOLIC PANEL  ? ? ?EKG ?None ? ?Radiology ?DG Chest 2 View ? ?Result Date: 07/15/2021 ?CLINICAL DATA:  Shortness of breath and congestion. EXAM: CHEST - 2 VIEW COMPARISON:  03/18/2021 FINDINGS: The cardiac silhouette, mediastinal and hilar contours are within normal limits. Stable tortuosity and calcification of the thoracic aorta. Stable eventration of the right hemidiaphragm. No acute pulmonary findings, pulmonary lesions or pleural effusions. Stable small radiopaque density in the right chest wall area. The bony structures are intact. IMPRESSION: No acute cardiopulmonary findings. Electronically Signed   By: Marijo Sanes M.D.   On: 07/15/2021 08:56   ? ?Procedures ?Procedures  ? ? ?Medications Ordered in ED ?Medications  ?ipratropium-albuterol (DUONEB) 0.5-2.5 (3) MG/3ML nebulizer solution 3 mL (3 mLs Nebulization Given 07/15/21 1059)  ?methylPREDNISolone sodium succinate (SOLU-MEDROL) 125 mg/2 mL injection 125 mg (125 mg Intravenous Given 07/15/21 1039)  ?iohexol (OMNIPAQUE) 350 MG/ML injection 100 mL (80 mLs Intravenous Contrast Given 07/15/21 1156)  ?potassium chloride SA (KLOR-CON M) CR tablet 40  mEq (40 mEq Oral Given 07/15/21 1329)  ? ? ?ED Course/ Medical Decision Making/ A&P ?  ?                        ?Medical Decision Making ?Amount and/or Complexity of Data Reviewed ?Labs: ordered. ?Radiology: ord

## 2021-07-16 DIAGNOSIS — L03031 Cellulitis of right toe: Secondary | ICD-10-CM | POA: Diagnosis not present

## 2021-07-16 DIAGNOSIS — M79671 Pain in right foot: Secondary | ICD-10-CM | POA: Diagnosis not present

## 2021-07-16 DIAGNOSIS — M79674 Pain in right toe(s): Secondary | ICD-10-CM | POA: Diagnosis not present

## 2021-07-18 DIAGNOSIS — E1122 Type 2 diabetes mellitus with diabetic chronic kidney disease: Secondary | ICD-10-CM | POA: Diagnosis not present

## 2021-07-18 DIAGNOSIS — I700 Atherosclerosis of aorta: Secondary | ICD-10-CM | POA: Diagnosis not present

## 2021-07-18 DIAGNOSIS — E876 Hypokalemia: Secondary | ICD-10-CM | POA: Diagnosis not present

## 2021-07-18 DIAGNOSIS — N1832 Chronic kidney disease, stage 3b: Secondary | ICD-10-CM | POA: Diagnosis not present

## 2021-07-18 DIAGNOSIS — Z794 Long term (current) use of insulin: Secondary | ICD-10-CM | POA: Diagnosis not present

## 2021-07-18 DIAGNOSIS — Z9981 Dependence on supplemental oxygen: Secondary | ICD-10-CM | POA: Diagnosis not present

## 2021-07-18 DIAGNOSIS — E871 Hypo-osmolality and hyponatremia: Secondary | ICD-10-CM | POA: Diagnosis not present

## 2021-07-18 DIAGNOSIS — E860 Dehydration: Secondary | ICD-10-CM | POA: Diagnosis not present

## 2021-07-21 DIAGNOSIS — E871 Hypo-osmolality and hyponatremia: Secondary | ICD-10-CM | POA: Diagnosis not present

## 2021-07-21 DIAGNOSIS — E1122 Type 2 diabetes mellitus with diabetic chronic kidney disease: Secondary | ICD-10-CM | POA: Diagnosis not present

## 2021-07-21 DIAGNOSIS — N1832 Chronic kidney disease, stage 3b: Secondary | ICD-10-CM | POA: Diagnosis not present

## 2021-07-21 DIAGNOSIS — I700 Atherosclerosis of aorta: Secondary | ICD-10-CM | POA: Diagnosis not present

## 2021-07-21 DIAGNOSIS — Z299 Encounter for prophylactic measures, unspecified: Secondary | ICD-10-CM | POA: Diagnosis not present

## 2021-07-21 DIAGNOSIS — F17210 Nicotine dependence, cigarettes, uncomplicated: Secondary | ICD-10-CM | POA: Diagnosis not present

## 2021-07-21 DIAGNOSIS — E860 Dehydration: Secondary | ICD-10-CM | POA: Diagnosis not present

## 2021-07-21 DIAGNOSIS — I10 Essential (primary) hypertension: Secondary | ICD-10-CM | POA: Diagnosis not present

## 2021-07-21 DIAGNOSIS — J069 Acute upper respiratory infection, unspecified: Secondary | ICD-10-CM | POA: Diagnosis not present

## 2021-07-21 DIAGNOSIS — Z794 Long term (current) use of insulin: Secondary | ICD-10-CM | POA: Diagnosis not present

## 2021-07-21 DIAGNOSIS — E876 Hypokalemia: Secondary | ICD-10-CM | POA: Diagnosis not present

## 2021-07-21 DIAGNOSIS — Z9981 Dependence on supplemental oxygen: Secondary | ICD-10-CM | POA: Diagnosis not present

## 2021-07-23 DIAGNOSIS — E1122 Type 2 diabetes mellitus with diabetic chronic kidney disease: Secondary | ICD-10-CM | POA: Diagnosis not present

## 2021-07-23 DIAGNOSIS — E871 Hypo-osmolality and hyponatremia: Secondary | ICD-10-CM | POA: Diagnosis not present

## 2021-07-23 DIAGNOSIS — N1832 Chronic kidney disease, stage 3b: Secondary | ICD-10-CM | POA: Diagnosis not present

## 2021-07-23 DIAGNOSIS — Z9981 Dependence on supplemental oxygen: Secondary | ICD-10-CM | POA: Diagnosis not present

## 2021-07-23 DIAGNOSIS — E860 Dehydration: Secondary | ICD-10-CM | POA: Diagnosis not present

## 2021-07-23 DIAGNOSIS — I700 Atherosclerosis of aorta: Secondary | ICD-10-CM | POA: Diagnosis not present

## 2021-07-23 DIAGNOSIS — Z794 Long term (current) use of insulin: Secondary | ICD-10-CM | POA: Diagnosis not present

## 2021-07-23 DIAGNOSIS — E876 Hypokalemia: Secondary | ICD-10-CM | POA: Diagnosis not present

## 2021-07-24 DIAGNOSIS — L110 Acquired keratosis follicularis: Secondary | ICD-10-CM | POA: Diagnosis not present

## 2021-07-24 DIAGNOSIS — E1140 Type 2 diabetes mellitus with diabetic neuropathy, unspecified: Secondary | ICD-10-CM | POA: Diagnosis not present

## 2021-07-24 DIAGNOSIS — L609 Nail disorder, unspecified: Secondary | ICD-10-CM | POA: Diagnosis not present

## 2021-07-25 DIAGNOSIS — Z9981 Dependence on supplemental oxygen: Secondary | ICD-10-CM | POA: Diagnosis not present

## 2021-07-25 DIAGNOSIS — E860 Dehydration: Secondary | ICD-10-CM | POA: Diagnosis not present

## 2021-07-25 DIAGNOSIS — E871 Hypo-osmolality and hyponatremia: Secondary | ICD-10-CM | POA: Diagnosis not present

## 2021-07-25 DIAGNOSIS — M6701 Short Achilles tendon (acquired), right ankle: Secondary | ICD-10-CM | POA: Diagnosis not present

## 2021-07-25 DIAGNOSIS — N1832 Chronic kidney disease, stage 3b: Secondary | ICD-10-CM | POA: Diagnosis not present

## 2021-07-25 DIAGNOSIS — E1122 Type 2 diabetes mellitus with diabetic chronic kidney disease: Secondary | ICD-10-CM | POA: Diagnosis not present

## 2021-07-25 DIAGNOSIS — I700 Atherosclerosis of aorta: Secondary | ICD-10-CM | POA: Diagnosis not present

## 2021-07-25 DIAGNOSIS — M14671 Charcot's joint, right ankle and foot: Secondary | ICD-10-CM | POA: Diagnosis not present

## 2021-07-25 DIAGNOSIS — E876 Hypokalemia: Secondary | ICD-10-CM | POA: Diagnosis not present

## 2021-07-25 DIAGNOSIS — Z794 Long term (current) use of insulin: Secondary | ICD-10-CM | POA: Diagnosis not present

## 2021-07-30 DIAGNOSIS — Z794 Long term (current) use of insulin: Secondary | ICD-10-CM | POA: Diagnosis not present

## 2021-07-30 DIAGNOSIS — E1122 Type 2 diabetes mellitus with diabetic chronic kidney disease: Secondary | ICD-10-CM | POA: Diagnosis not present

## 2021-07-30 DIAGNOSIS — N1832 Chronic kidney disease, stage 3b: Secondary | ICD-10-CM | POA: Diagnosis not present

## 2021-07-30 DIAGNOSIS — I700 Atherosclerosis of aorta: Secondary | ICD-10-CM | POA: Diagnosis not present

## 2021-07-30 DIAGNOSIS — Z9981 Dependence on supplemental oxygen: Secondary | ICD-10-CM | POA: Diagnosis not present

## 2021-07-31 DIAGNOSIS — E1122 Type 2 diabetes mellitus with diabetic chronic kidney disease: Secondary | ICD-10-CM | POA: Diagnosis not present

## 2021-07-31 DIAGNOSIS — N1832 Chronic kidney disease, stage 3b: Secondary | ICD-10-CM | POA: Diagnosis not present

## 2021-07-31 DIAGNOSIS — I700 Atherosclerosis of aorta: Secondary | ICD-10-CM | POA: Diagnosis not present

## 2021-08-02 DIAGNOSIS — M14671 Charcot's joint, right ankle and foot: Secondary | ICD-10-CM | POA: Diagnosis not present

## 2021-08-04 DIAGNOSIS — E1122 Type 2 diabetes mellitus with diabetic chronic kidney disease: Secondary | ICD-10-CM | POA: Diagnosis not present

## 2021-08-04 DIAGNOSIS — N1832 Chronic kidney disease, stage 3b: Secondary | ICD-10-CM | POA: Diagnosis not present

## 2021-08-04 DIAGNOSIS — Z794 Long term (current) use of insulin: Secondary | ICD-10-CM | POA: Diagnosis not present

## 2021-08-04 DIAGNOSIS — Z9981 Dependence on supplemental oxygen: Secondary | ICD-10-CM | POA: Diagnosis not present

## 2021-08-04 DIAGNOSIS — I700 Atherosclerosis of aorta: Secondary | ICD-10-CM | POA: Diagnosis not present

## 2021-08-11 DIAGNOSIS — I700 Atherosclerosis of aorta: Secondary | ICD-10-CM | POA: Diagnosis not present

## 2021-08-11 DIAGNOSIS — E1122 Type 2 diabetes mellitus with diabetic chronic kidney disease: Secondary | ICD-10-CM | POA: Diagnosis not present

## 2021-08-11 DIAGNOSIS — N1832 Chronic kidney disease, stage 3b: Secondary | ICD-10-CM | POA: Diagnosis not present

## 2021-08-11 DIAGNOSIS — Z794 Long term (current) use of insulin: Secondary | ICD-10-CM | POA: Diagnosis not present

## 2021-08-11 DIAGNOSIS — Z9981 Dependence on supplemental oxygen: Secondary | ICD-10-CM | POA: Diagnosis not present

## 2021-08-12 DIAGNOSIS — Z299 Encounter for prophylactic measures, unspecified: Secondary | ICD-10-CM | POA: Diagnosis not present

## 2021-08-12 DIAGNOSIS — I10 Essential (primary) hypertension: Secondary | ICD-10-CM | POA: Diagnosis not present

## 2021-08-12 DIAGNOSIS — E876 Hypokalemia: Secondary | ICD-10-CM | POA: Diagnosis not present

## 2021-08-12 DIAGNOSIS — E1165 Type 2 diabetes mellitus with hyperglycemia: Secondary | ICD-10-CM | POA: Diagnosis not present

## 2021-08-12 DIAGNOSIS — K5909 Other constipation: Secondary | ICD-10-CM | POA: Diagnosis not present

## 2021-08-12 DIAGNOSIS — F17210 Nicotine dependence, cigarettes, uncomplicated: Secondary | ICD-10-CM | POA: Diagnosis not present

## 2021-08-13 DIAGNOSIS — R531 Weakness: Secondary | ICD-10-CM | POA: Diagnosis not present

## 2021-08-13 DIAGNOSIS — S82892B Other fracture of left lower leg, initial encounter for open fracture type I or II: Secondary | ICD-10-CM | POA: Diagnosis not present

## 2021-08-13 DIAGNOSIS — E876 Hypokalemia: Secondary | ICD-10-CM | POA: Diagnosis not present

## 2021-08-13 DIAGNOSIS — G9341 Metabolic encephalopathy: Secondary | ICD-10-CM | POA: Diagnosis not present

## 2021-08-20 DIAGNOSIS — E119 Type 2 diabetes mellitus without complications: Secondary | ICD-10-CM | POA: Diagnosis not present

## 2021-08-20 DIAGNOSIS — K219 Gastro-esophageal reflux disease without esophagitis: Secondary | ICD-10-CM | POA: Diagnosis not present

## 2021-08-20 DIAGNOSIS — I10 Essential (primary) hypertension: Secondary | ICD-10-CM | POA: Diagnosis not present

## 2021-08-21 DIAGNOSIS — E1122 Type 2 diabetes mellitus with diabetic chronic kidney disease: Secondary | ICD-10-CM | POA: Diagnosis not present

## 2021-08-21 DIAGNOSIS — Z794 Long term (current) use of insulin: Secondary | ICD-10-CM | POA: Diagnosis not present

## 2021-08-21 DIAGNOSIS — I700 Atherosclerosis of aorta: Secondary | ICD-10-CM | POA: Diagnosis not present

## 2021-08-21 DIAGNOSIS — Z9981 Dependence on supplemental oxygen: Secondary | ICD-10-CM | POA: Diagnosis not present

## 2021-08-21 DIAGNOSIS — N1832 Chronic kidney disease, stage 3b: Secondary | ICD-10-CM | POA: Diagnosis not present

## 2021-08-27 DIAGNOSIS — Z9981 Dependence on supplemental oxygen: Secondary | ICD-10-CM | POA: Diagnosis not present

## 2021-08-27 DIAGNOSIS — Z794 Long term (current) use of insulin: Secondary | ICD-10-CM | POA: Diagnosis not present

## 2021-08-27 DIAGNOSIS — E1122 Type 2 diabetes mellitus with diabetic chronic kidney disease: Secondary | ICD-10-CM | POA: Diagnosis not present

## 2021-08-27 DIAGNOSIS — N1832 Chronic kidney disease, stage 3b: Secondary | ICD-10-CM | POA: Diagnosis not present

## 2021-08-27 DIAGNOSIS — I700 Atherosclerosis of aorta: Secondary | ICD-10-CM | POA: Diagnosis not present

## 2021-09-02 DIAGNOSIS — M14671 Charcot's joint, right ankle and foot: Secondary | ICD-10-CM | POA: Diagnosis not present

## 2021-09-08 ENCOUNTER — Telehealth (INDEPENDENT_AMBULATORY_CARE_PROVIDER_SITE_OTHER): Payer: Self-pay | Admitting: *Deleted

## 2021-09-08 NOTE — Telephone Encounter (Signed)
Jason Woods ( pt's sister and on [place]) Calling to check on referral. States Dr. Woody Seller sent over referral for [facility]. She states she will be bringing him for appointment and to call her with appt information and ok to leave detailed voicemail. On her number [phone]. ( I did call patient to see when referral was sent from Dr. Woody Seller and she said last Thursday is when patient asked Dr. Woody Seller to send it. I let patient know office has not had time to process that request that soon but would send a message to referral coordinator for when she received the referral.)

## 2021-09-09 ENCOUNTER — Encounter (INDEPENDENT_AMBULATORY_CARE_PROVIDER_SITE_OTHER): Payer: Self-pay | Admitting: *Deleted

## 2021-09-09 NOTE — Telephone Encounter (Signed)
Spoke to Jason Woods, patient is shceduled 10/02/21 at 230, apt letter also mailed to patient

## 2021-09-11 DIAGNOSIS — E1122 Type 2 diabetes mellitus with diabetic chronic kidney disease: Secondary | ICD-10-CM | POA: Diagnosis not present

## 2021-09-11 DIAGNOSIS — Z794 Long term (current) use of insulin: Secondary | ICD-10-CM | POA: Diagnosis not present

## 2021-09-11 DIAGNOSIS — Z9981 Dependence on supplemental oxygen: Secondary | ICD-10-CM | POA: Diagnosis not present

## 2021-09-11 DIAGNOSIS — N1832 Chronic kidney disease, stage 3b: Secondary | ICD-10-CM | POA: Diagnosis not present

## 2021-09-11 DIAGNOSIS — I700 Atherosclerosis of aorta: Secondary | ICD-10-CM | POA: Diagnosis not present

## 2021-09-12 DIAGNOSIS — F17210 Nicotine dependence, cigarettes, uncomplicated: Secondary | ICD-10-CM | POA: Diagnosis not present

## 2021-09-12 DIAGNOSIS — Z299 Encounter for prophylactic measures, unspecified: Secondary | ICD-10-CM | POA: Diagnosis not present

## 2021-09-12 DIAGNOSIS — S41119A Laceration without foreign body of unspecified upper arm, initial encounter: Secondary | ICD-10-CM | POA: Diagnosis not present

## 2021-09-12 DIAGNOSIS — M25562 Pain in left knee: Secondary | ICD-10-CM | POA: Diagnosis not present

## 2021-09-12 DIAGNOSIS — M25561 Pain in right knee: Secondary | ICD-10-CM | POA: Diagnosis not present

## 2021-09-12 DIAGNOSIS — I10 Essential (primary) hypertension: Secondary | ICD-10-CM | POA: Diagnosis not present

## 2021-09-13 ENCOUNTER — Emergency Department (HOSPITAL_COMMUNITY)
Admission: EM | Admit: 2021-09-13 | Discharge: 2021-09-13 | Disposition: A | Discharge status: Home / Self Care | Payer: Medicare Other | Attending: Emergency Medicine | Admitting: Emergency Medicine

## 2021-09-13 ENCOUNTER — Emergency Department (HOSPITAL_COMMUNITY): Payer: Medicare Other

## 2021-09-13 ENCOUNTER — Encounter (HOSPITAL_COMMUNITY): Payer: Self-pay | Admitting: *Deleted

## 2021-09-13 ENCOUNTER — Other Ambulatory Visit: Payer: Self-pay

## 2021-09-13 DIAGNOSIS — Z794 Long term (current) use of insulin: Secondary | ICD-10-CM | POA: Diagnosis not present

## 2021-09-13 DIAGNOSIS — I4892 Unspecified atrial flutter: Secondary | ICD-10-CM | POA: Diagnosis not present

## 2021-09-13 DIAGNOSIS — Z7984 Long term (current) use of oral hypoglycemic drugs: Secondary | ICD-10-CM | POA: Diagnosis not present

## 2021-09-13 DIAGNOSIS — Z79899 Other long term (current) drug therapy: Secondary | ICD-10-CM | POA: Insufficient documentation

## 2021-09-13 DIAGNOSIS — D649 Anemia, unspecified: Secondary | ICD-10-CM | POA: Insufficient documentation

## 2021-09-13 DIAGNOSIS — R944 Abnormal results of kidney function studies: Secondary | ICD-10-CM | POA: Insufficient documentation

## 2021-09-13 DIAGNOSIS — Z043 Encounter for examination and observation following other accident: Secondary | ICD-10-CM | POA: Diagnosis not present

## 2021-09-13 DIAGNOSIS — S0990XA Unspecified injury of head, initial encounter: Secondary | ICD-10-CM | POA: Diagnosis not present

## 2021-09-13 DIAGNOSIS — M25562 Pain in left knee: Secondary | ICD-10-CM | POA: Diagnosis not present

## 2021-09-13 DIAGNOSIS — R531 Weakness: Secondary | ICD-10-CM | POA: Diagnosis not present

## 2021-09-13 DIAGNOSIS — R739 Hyperglycemia, unspecified: Secondary | ICD-10-CM | POA: Insufficient documentation

## 2021-09-13 DIAGNOSIS — G9341 Metabolic encephalopathy: Secondary | ICD-10-CM | POA: Diagnosis not present

## 2021-09-13 DIAGNOSIS — R296 Repeated falls: Secondary | ICD-10-CM | POA: Diagnosis not present

## 2021-09-13 DIAGNOSIS — E876 Hypokalemia: Secondary | ICD-10-CM | POA: Diagnosis not present

## 2021-09-13 DIAGNOSIS — S82892B Other fracture of left lower leg, initial encounter for open fracture type I or II: Secondary | ICD-10-CM | POA: Diagnosis not present

## 2021-09-13 LAB — CBC WITH DIFFERENTIAL/PLATELET
Abs Immature Granulocytes: 0.01 10*3/uL (ref 0.00–0.07)
Basophils Absolute: 0 10*3/uL (ref 0.0–0.1)
Basophils Relative: 1 %
Eosinophils Absolute: 0.1 10*3/uL (ref 0.0–0.5)
Eosinophils Relative: 2 %
HCT: 34.9 % — ABNORMAL LOW (ref 39.0–52.0)
Hemoglobin: 11.9 g/dL — ABNORMAL LOW (ref 13.0–17.0)
Immature Granulocytes: 0 %
Lymphocytes Relative: 17 %
Lymphs Abs: 1 10*3/uL (ref 0.7–4.0)
MCH: 31.3 pg (ref 26.0–34.0)
MCHC: 34.1 g/dL (ref 30.0–36.0)
MCV: 91.8 fL (ref 80.0–100.0)
Monocytes Absolute: 0.4 10*3/uL (ref 0.1–1.0)
Monocytes Relative: 7 %
Neutro Abs: 4.3 10*3/uL (ref 1.7–7.7)
Neutrophils Relative %: 73 %
Platelets: 181 10*3/uL (ref 150–400)
RBC: 3.8 MIL/uL — ABNORMAL LOW (ref 4.22–5.81)
RDW: 12.8 % (ref 11.5–15.5)
WBC: 5.8 10*3/uL (ref 4.0–10.5)
nRBC: 0 % (ref 0.0–0.2)

## 2021-09-13 LAB — COMPREHENSIVE METABOLIC PANEL
ALT: 12 U/L (ref 0–44)
AST: 21 U/L (ref 15–41)
Albumin: 3.5 g/dL (ref 3.5–5.0)
Alkaline Phosphatase: 72 U/L (ref 38–126)
Anion gap: 8 (ref 5–15)
BUN: 22 mg/dL (ref 8–23)
CO2: 29 mmol/L (ref 22–32)
Calcium: 8.8 mg/dL — ABNORMAL LOW (ref 8.9–10.3)
Chloride: 95 mmol/L — ABNORMAL LOW (ref 98–111)
Creatinine, Ser: 1.33 mg/dL — ABNORMAL HIGH (ref 0.61–1.24)
GFR, Estimated: 55 mL/min — ABNORMAL LOW (ref >60)
Glucose, Bld: 223 mg/dL — ABNORMAL HIGH (ref 70–99)
Potassium: 4.2 mmol/L (ref 3.5–5.1)
Sodium: 132 mmol/L — ABNORMAL LOW (ref 135–145)
Total Bilirubin: 0.5 mg/dL (ref 0.3–1.2)
Total Protein: 6.5 g/dL (ref 6.5–8.1)

## 2021-09-13 LAB — CBG MONITORING, ED: Glucose-Capillary: 247 mg/dL — ABNORMAL HIGH (ref 70–99)

## 2021-09-13 LAB — TROPONIN I (HIGH SENSITIVITY)
Troponin I (High Sensitivity): 21 ng/L — ABNORMAL HIGH (ref <18)
Troponin I (High Sensitivity): 20 ng/L — ABNORMAL HIGH (ref <18)

## 2021-09-13 LAB — BRAIN NATRIURETIC PEPTIDE: B Natriuretic Peptide: 232 pg/mL — ABNORMAL HIGH (ref 0.0–100.0)

## 2021-09-13 LAB — MAGNESIUM: Magnesium: 1.6 mg/dL — ABNORMAL LOW (ref 1.7–2.4)

## 2021-09-13 MED ORDER — APIXABAN 5 MG PO TABS
5.0000 mg | ORAL_TABLET | Freq: Two times a day (BID) | ORAL | Status: DC
  Filled 2021-09-13: qty 1

## 2021-09-13 NOTE — ED Notes (Signed)
CBG 247 

## 2021-09-13 NOTE — ED Notes (Signed)
Patient refused to take eliquis or take home the 30 day supply voucher.

## 2021-09-13 NOTE — ED Provider Notes (Signed)
[facility] EMERGENCY DEPARTMENT Provider Note   CSN: [id number] Arrival date & time: 09/13/21  1000     History  Chief Complaint  Patient presents with   Fall    Jason Woods is a 76 y.o. male.   Fall    Patient presents due to fall.  He has been falling often in the last month, most recently fell yesterday.  He states healing on his left knee and has been having pain there.  Denies hitting his head or losing consciousness.  He thinks his blood pressure has been low, denies any recent changes to blood pressure.  Has not missed any doses of his medicine.  Patient's sister is concerned about his health.  She states he has been very weak and falling, states he is not very forthcoming about his actual problems but he has been feeling weak.  Home Medications Prior to Admission medications   Medication Sig Start Date End Date Taking? Authorizing Provider  albuterol (VENTOLIN HFA) 108 (90 Base) MCG/ACT inhaler Inhale 1-2 puffs into the lungs every 6 (six) hours as needed for wheezing or shortness of breath. 07/15/21  Yes Smoot, Sarah A, PA-C  ALPRAZolam (XANAX) 1 MG tablet Take 0.5 tablets (0.5 mg total) by mouth 3 (three) times daily. 09/15/20  Yes Johnson, Clanford L, MD  b complex vitamins tablet Take 1 tablet by mouth daily. With C   Yes [provider]  bimatoprost (LUMIGAN) 0.01 % SOLN Place 1 drop into the right eye at bedtime.   Yes [provider]  BREO ELLIPTA 100-25 MCG/ACT AEPB Inhale 1 puff into the lungs daily. 09/02/21  Yes [provider]  buprenorphine (SUBUTEX) 2 MG SUBL SL tablet Place 2 mg under the tongue daily. 09/12/21  Yes [provider]  cetirizine (ZYRTEC) 10 MG tablet Take 10 mg by mouth daily. 09/06/21  Yes [provider]  Cholecalciferol 50 MCG (2000 UT) TABS Take 2,000 Units by mouth daily.   Yes [provider]  cyanocobalamin 2000 MCG tablet Take 2,000 mcg by mouth daily.   Yes [provider]  docusate sodium (COLACE) 100 MG capsule Take 1 capsule (100 mg total) by mouth 2 (two) times daily. While taking narcotic pain medicine. 12/06/18  Yes Corky Sing, PA-C  dorzolamide (TRUSOPT) 2 % ophthalmic solution Place 1 drop into both eyes 3 (three) times daily.  04/15/17  Yes [provider]  DULoxetine (CYMBALTA) 30 MG capsule Take 30 mg by mouth daily.  10/11/17  Yes [provider]  fluticasone (FLONASE) 50 MCG/ACT nasal spray Place 2 sprays into both nostrils daily.    Yes [provider]  gabapentin (NEURONTIN) 400 MG capsule Take 400 mg by mouth 2 (two) times daily. 09/06/21  Yes [provider]  HUMALOG KWIKPEN 100 UNIT/ML KwikPen Inject 5 Units into the skin in the morning, at noon, and at bedtime. 09/06/21  Yes [provider]  hydrochlorothiazide (HYDRODIURIL) 25 MG tablet Take 25 mg by mouth daily. 09/06/21  Yes [provider]  hydrOXYzine (ATARAX) 25 MG tablet Take 25 mg by mouth at bedtime. 09/06/21  Yes [provider]  insulin glargine (LANTUS) 100 UNIT/ML injection Inject 0.1-0.18 mLs (10-18 Units total) into the skin See admin instructions. Per sliding  16 units in the morning and 8-10 units in the evening 09/15/20  Yes Johnson, Clanford L, MD  ipratropium-albuterol (DUONEB) 0.5-2.5 (3) MG/3ML SOLN Take 3 mLs by nebulization every 6 (six) hours as needed for shortness  of breath. 09/03/21  Yes [provider]  ketoconazole (NIZORAL) 2 % cream Apply 1 application topically daily.   Yes [provider]  linaclotide (LINZESS) 145 MCG CAPS capsule Take 145 mcg by mouth daily before breakfast.   Yes [provider]  losartan (COZAAR) 100 MG tablet Take 100 mg by mouth daily. 09/06/21  Yes [provider]  metFORMIN (GLUCOPHAGE) 850 MG tablet Take 850 mg by mouth 2 (two) times daily with a meal.   Yes [provider]  metoprolol succinate (TOPROL-XL) 25 MG 24 hr tablet Take 25  mg by mouth daily. 06/28/18  Yes [provider]  Multiple Vitamin (MULTIVITAMIN WITH MINERALS) TABS tablet Take 1 tablet by mouth daily.   Yes [provider]  oxyCODONE-acetaminophen (PERCOCET) 10-325 MG tablet Take 1 tablet by mouth 2 (two) times daily as needed for pain. 08/16/21  Yes [provider]  pantoprazole (PROTONIX) 40 MG tablet Take 40 mg by mouth daily.   Yes [provider]  rosuvastatin (CRESTOR) 5 MG tablet Take 5 mg by mouth once a week. 09/06/21  Yes [provider]  SYMBICORT 160-4.5 MCG/ACT inhaler Inhale 2 puffs into the lungs in the morning and at bedtime. 07/14/21  Yes [provider]  traZODone (DESYREL) 100 MG tablet Take 200 mg by mouth at bedtime as needed for sleep. 09/08/21  Yes [provider]  triamcinolone cream (KENALOG) 0.1 % Apply 1 application topically daily as needed (affected area(s) on skin).  04/15/17  Yes [provider]  TRULANCE 3 MG TABS Take 1 tablet by mouth daily. 08/25/21  Yes [provider]      Allergies    Lipitor [atorvastatin], Zocor [simvastatin-high dose], Celexa [citalopram hydrobromide], Crestor [rosuvastatin], Mobic [meloxicam], and Prozac [fluoxetine hcl]    Review of Systems   Review of Systems  Physical Exam Updated Vital Signs BP (!) 153/79   Pulse 62   Temp (!) 97.5 F (36.4 C) (Oral)   Resp 14   Ht '5\' 7"'$  (1.702 m)   Wt 76.7 kg   SpO2 100%   BMI 26.47 kg/m  Physical Exam Vitals and nursing note reviewed. Exam conducted with a chaperone present.  Constitutional:      Appearance: Normal appearance.  HENT:     Head: Normocephalic and atraumatic.     Comments: No hematoma, periorbital ecchymosis, battle sign, or scalp lacerations Eyes:     General: No scleral icterus.       Right eye: No discharge.        Left eye: No discharge.     Extraocular Movements: Extraocular movements intact.     Pupils: Pupils are equal, round, and reactive to light.   Cardiovascular:     Rate and Rhythm: Normal rate and regular rhythm.     Pulses: Normal pulses.     Heart sounds: Normal heart sounds. No murmur heard.    No friction rub. No gallop.  Pulmonary:     Effort: Pulmonary effort is normal. No respiratory distress.     Breath sounds: Normal breath sounds.  Abdominal:     General: Abdomen is flat. Bowel sounds are normal. There is no distension.     Palpations: Abdomen is soft.     Tenderness: There is no abdominal tenderness.  Musculoskeletal:        General: Tenderness present.  Skin:    General: Skin is warm and dry.     Coloration: Skin is not jaundiced.  Neurological:  Mental Status: He is alert. Mental status is at baseline.     Coordination: Coordination normal.     Comments: Cranial nerves III through XII are grossly intact.  Grip strength equal bilaterally, able to raise both lower extremities without difficulty.     ED Results / Procedures / Treatments   Labs (all labs ordered are listed, but only abnormal results are displayed) Labs Reviewed  CBC WITH DIFFERENTIAL/PLATELET - Abnormal; Notable for the following components:      Result Value   RBC 3.80 (*)    Hemoglobin 11.9 (*)    HCT 34.9 (*)    All other components within normal limits  COMPREHENSIVE METABOLIC PANEL - Abnormal; Notable for the following components:   Sodium 132 (*)    Chloride 95 (*)    Glucose, Bld 223 (*)    Creatinine, Ser 1.33 (*)    Calcium 8.8 (*)    GFR, Estimated 55 (*)    All other components within normal limits  MAGNESIUM - Abnormal; Notable for the following components:   Magnesium 1.6 (*)    All other components within normal limits  BRAIN NATRIURETIC PEPTIDE - Abnormal; Notable for the following components:   B Natriuretic Peptide 232.0 (*)    All other components within normal limits  CBG MONITORING, ED - Abnormal; Notable for the following components:   Glucose-Capillary 247 (*)    All other components within normal limits   TROPONIN I (HIGH SENSITIVITY) - Abnormal; Notable for the following components:   Troponin I (High Sensitivity) 21 (*)    All other components within normal limits  TROPONIN I (HIGH SENSITIVITY) - Abnormal; Notable for the following components:   Troponin I (High Sensitivity) 20 (*)    All other components within normal limits  URINALYSIS, ROUTINE W REFLEX MICROSCOPIC    EKG EKG Interpretation  Date/Time:  Saturday September 13 2021 12:01:37 EDT Ventricular Rate:  78 PR Interval:    QRS Duration: 99 QT Interval:  476 QTC Calculation: 543 R Axis:   43 Text Interpretation: Atrial flutter with predominant 4:1 AV block Inferoposterior infarct, old Lateral leads are also involved Prolonged QT interval Baseline wander in lead(s) V6 Confirmed by Fredia Sorrow [phone]) on 09/13/2021 2:01:04 PM  Radiology DG Pelvis 1-2 Views  Result Date: 09/13/2021 CLINICAL DATA:  Fall EXAM: PELVIS - 1-2 VIEW COMPARISON:  03/20/2011 FINDINGS: There is no evidence of pelvic fracture or diastasis. No pelvic bone lesions are seen. IMPRESSION: Negative. Electronically Signed   By: Franchot Gallo M.D.   On: 09/13/2021 13:02   DG Chest 2 View  Result Date: 09/13/2021 CLINICAL DATA:  Fall EXAM: CHEST - 2 VIEW COMPARISON:  07/15/2021 FINDINGS: Heart size upper normal. Vascularity normal. Lungs clear without infiltrate or effusion. Metal foreign body in the right anterior chest unchanged. Mild elevation right hemidiaphragm unchanged. IMPRESSION: No active cardiopulmonary disease. Electronically Signed   By: Franchot Gallo M.D.   On: 09/13/2021 13:01   CT Head Wo Contrast  Result Date: 09/13/2021 CLINICAL DATA:  Patient fell yesterday. EXAM: CT HEAD WITHOUT CONTRAST CT CERVICAL SPINE WITHOUT CONTRAST TECHNIQUE: Multidetector CT imaging of the head and cervical spine was performed following the standard protocol without intravenous contrast. Multiplanar CT image reconstructions of the cervical spine were also generated.  RADIATION DOSE REDUCTION: This exam was performed according to the departmental dose-optimization program which includes automated exposure control, adjustment of the mA and/or kV according to patient size and/or use of iterative reconstruction technique. COMPARISON:  MR  cervical spine 06/29/2017 FINDINGS: CT HEAD FINDINGS Brain: There is no evidence for acute hemorrhage, hydrocephalus, mass lesion, or abnormal extra-axial fluid collection. No definite CT evidence for acute infarction. Diffuse loss of parenchymal volume is consistent with atrophy. Patchy low attenuation in the deep hemispheric and periventricular white matter is nonspecific, but likely reflects chronic microvascular ischemic demyelination. Vascular: No hyperdense vessel or unexpected calcification. Skull: No evidence for fracture. No worrisome lytic or sclerotic lesion. Sinuses/Orbits: Surgical changes noted right mastoid air cells. The visualized paranasal sinuses and mastoid air cells are clear. Visualized portions of the globes and intraorbital fat are unremarkable. Other: None. CT CERVICAL SPINE FINDINGS Alignment: Normal. Skull base and vertebrae: No acute fracture. No primary bone lesion or focal pathologic process. Soft tissues and spinal canal: No prevertebral fluid or swelling. No visible canal hematoma. Disc levels: Marked loss of disc height noted C3-4, C5-6, and C6-7 with endplate spurring at the same levels. Diffuse bilateral facet osteoarthritis evident. Upper chest: Unremarkable. Other: None. IMPRESSION: 1. No acute intracranial abnormality. 2. Atrophy with chronic small vessel ischemic disease. 3. No evidence for cervical spine fracture or traumatic subluxation. Electronically Signed   By: Misty Stanley M.D.   On: 09/13/2021 12:52   CT Cervical Spine Wo Contrast  Result Date: 09/13/2021 CLINICAL DATA:  Patient fell yesterday. EXAM: CT HEAD WITHOUT CONTRAST CT CERVICAL SPINE WITHOUT CONTRAST TECHNIQUE: Multidetector CT imaging of  the head and cervical spine was performed following the standard protocol without intravenous contrast. Multiplanar CT image reconstructions of the cervical spine were also generated. RADIATION DOSE REDUCTION: This exam was performed according to the departmental dose-optimization program which includes automated exposure control, adjustment of the mA and/or kV according to patient size and/or use of iterative reconstruction technique. COMPARISON:  MR cervical spine 06/29/2017 FINDINGS: CT HEAD FINDINGS Brain: There is no evidence for acute hemorrhage, hydrocephalus, mass lesion, or abnormal extra-axial fluid collection. No definite CT evidence for acute infarction. Diffuse loss of parenchymal volume is consistent with atrophy. Patchy low attenuation in the deep hemispheric and periventricular white matter is nonspecific, but likely reflects chronic microvascular ischemic demyelination. Vascular: No hyperdense vessel or unexpected calcification. Skull: No evidence for fracture. No worrisome lytic or sclerotic lesion. Sinuses/Orbits: Surgical changes noted right mastoid air cells. The visualized paranasal sinuses and mastoid air cells are clear. Visualized portions of the globes and intraorbital fat are unremarkable. Other: None. CT CERVICAL SPINE FINDINGS Alignment: Normal. Skull base and vertebrae: No acute fracture. No primary bone lesion or focal pathologic process. Soft tissues and spinal canal: No prevertebral fluid or swelling. No visible canal hematoma. Disc levels: Marked loss of disc height noted C3-4, C5-6, and C6-7 with endplate spurring at the same levels. Diffuse bilateral facet osteoarthritis evident. Upper chest: Unremarkable. Other: None. IMPRESSION: 1. No acute intracranial abnormality. 2. Atrophy with chronic small vessel ischemic disease. 3. No evidence for cervical spine fracture or traumatic subluxation. Electronically Signed   By: Misty Stanley M.D.   On: 09/13/2021 12:52   DG Knee Complete 4  Views Left  Result Date: 09/13/2021 CLINICAL DATA:  Fall yesterday.  Left knee pain. EXAM: LEFT KNEE - COMPLETE 4+ VIEW COMPARISON:  None Available. FINDINGS: No evidence of fracture or dislocation. Probable small knee joint effusion noted. No evidence of arthropathy or other focal bone abnormality. Diffuse peripheral vascular calcification noted. IMPRESSION: Probable small knee joint effusion. No evidence of fracture or dislocation. Electronically Signed   By: Marlaine Hind M.D.   On: 09/13/2021 11:24  Procedures Procedures    Medications Ordered in ED Medications  apixaban (ELIQUIS) tablet 5 mg (5 mg Oral Patient Refused/Not Given 09/13/21 1611)    ED Course/ Medical Decision Making/ A&P                           Medical Decision Making Amount and/or Complexity of Data Reviewed Labs: ordered. Radiology: ordered.  Risk Prescription drug management.   Patient presents due to fall.  He has been falling recently per collateral obtained by sister.  On exam I do not see any overt signs of trauma.  Patient is in immobilization to right lower extremity due to fracture.  He is not having any chest pain or shortness of breath, denies feeling dizzy or having any headaches.  He states he fell yesterday but is having pain in his left knee.  I ordered blood work-up for various etiology for weakness or recent falls.  Patient does not have a leukocytosis, he is stable anemia with low hemoglobin of 11.9.  He has a slight increase in creatinine at 1.33 and he is hyperglycemic but not in DKA.  Magnesium is mildly decreased at 1.6.  Negative delta troponin, initial was elevated 21 but ultimately delta troponins are flat.  BNP is also slightly elevated.  I ordered and viewed imaging.  There are no acute processes.  Patient is in atrial flutter on the EKG based interpretation.  I spoke with Dr. Carolyne Littles with cardiology to confirm and this is his impression as well.  We discussed the patient, does not feel  patient needs admission for atrial flutter alone.  He advices to start the patient on Eliquis given his [place] Vasc is high and patient to be seen in cardiology outpatient office.    I discussed extensively with the patient that I would prefer to bring him into the hospital due to new onset AFL and frequent falls at home.  We engaged in shared decision-making regarding Eliquis, ultimately I feel he needs to be on it due to the risk of stroke.  We did discuss the risk of falls, patient is very clear that if he falls, hit his head or is in an accident needs to return to the ED for evaluation of the risk of not doing so could involve intracranial bleed, death or significant injury.  We also discussed he should not be on any anti-inflammatory medicine.  Again, I expressed to the patient I prefer to admit him.  Patient states she feels safe at home and does not desire admission.  He states he is ready to leave.  Prior to discharge patient states he is not going to take the Eliquis.  We discussed risks of not being on blood thinners include strokes, blood clots, complications that could result in death or long-term immobilization.  He verbalized understanding.    He states he does not wish to follow-up with cardiology or a flutter clinic and states he is leaving.  I have put in ambulatory referral to cardiology and atrial fibrillation clinic.  I also put in home health assessment to evaluate the patient with a face-to-face given I am concerned about his gait stability.  I have put in ambulatory referrals to cardiology and atrial fibrillation clinic.  Information provided regarding atrial flutter as well as apixaban.    Discussed HPI, physical exam and plan of care for this patient with attending Fredia Sorrow. The attending physician evaluated this patient as part of a  shared visit and agrees with plan of care.         Final Clinical Impression(s) / ED Diagnoses Final diagnoses:  Atrial flutter,  unspecified type ([place])    Rx / DC Orders ED Discharge Orders          Ordered    Ambulatory referral to Cardiology       Comments: If you have not heard from the Cardiology office within the next 72 hours please call [phone].   09/13/21 1519    Amb referral to AFIB Clinic        09/13/21 1546              Sherrill Raring, PA-C 09/13/21 1854    Fredia Sorrow, MD 09/20/21 [phone]

## 2021-09-19 DIAGNOSIS — Z794 Long term (current) use of insulin: Secondary | ICD-10-CM | POA: Diagnosis not present

## 2021-09-19 DIAGNOSIS — I700 Atherosclerosis of aorta: Secondary | ICD-10-CM | POA: Diagnosis not present

## 2021-09-19 DIAGNOSIS — E1122 Type 2 diabetes mellitus with diabetic chronic kidney disease: Secondary | ICD-10-CM | POA: Diagnosis not present

## 2021-09-19 DIAGNOSIS — N1832 Chronic kidney disease, stage 3b: Secondary | ICD-10-CM | POA: Diagnosis not present

## 2021-09-19 DIAGNOSIS — Z9981 Dependence on supplemental oxygen: Secondary | ICD-10-CM | POA: Diagnosis not present

## 2021-09-25 DIAGNOSIS — Z79891 Long term (current) use of opiate analgesic: Secondary | ICD-10-CM | POA: Diagnosis not present

## 2021-09-25 DIAGNOSIS — M25559 Pain in unspecified hip: Secondary | ICD-10-CM | POA: Diagnosis not present

## 2021-09-25 DIAGNOSIS — M545 Low back pain, unspecified: Secondary | ICD-10-CM | POA: Diagnosis not present

## 2021-09-25 DIAGNOSIS — M25532 Pain in left wrist: Secondary | ICD-10-CM | POA: Diagnosis not present

## 2021-09-25 DIAGNOSIS — M5010 Cervical disc disorder with radiculopathy, unspecified cervical region: Secondary | ICD-10-CM | POA: Diagnosis not present

## 2021-09-25 DIAGNOSIS — M79662 Pain in left lower leg: Secondary | ICD-10-CM | POA: Diagnosis not present

## 2021-10-02 ENCOUNTER — Encounter (INDEPENDENT_AMBULATORY_CARE_PROVIDER_SITE_OTHER): Payer: Self-pay | Admitting: Gastroenterology

## 2021-10-02 ENCOUNTER — Ambulatory Visit (INDEPENDENT_AMBULATORY_CARE_PROVIDER_SITE_OTHER): Payer: Medicare Other | Admitting: Gastroenterology

## 2021-10-02 VITALS — BP 121/69 | HR 70 | Temp 98.3°F | Ht 67.0 in | Wt 168.0 lb

## 2021-10-02 DIAGNOSIS — K5900 Constipation, unspecified: Secondary | ICD-10-CM | POA: Diagnosis not present

## 2021-10-02 DIAGNOSIS — K219 Gastro-esophageal reflux disease without esophagitis: Secondary | ICD-10-CM | POA: Diagnosis not present

## 2021-10-02 DIAGNOSIS — Z1211 Encounter for screening for malignant neoplasm of colon: Secondary | ICD-10-CM | POA: Diagnosis not present

## 2021-10-02 DIAGNOSIS — R1310 Dysphagia, unspecified: Secondary | ICD-10-CM

## 2021-10-02 DIAGNOSIS — M14671 Charcot's joint, right ankle and foot: Secondary | ICD-10-CM | POA: Diagnosis not present

## 2021-10-02 NOTE — Patient Instructions (Addendum)
As we discussed, given your new onset of atrial flutter, an irregular heartbeat that can put you at higher risk for adverse cardiac events, blood clots and strokes, it is important that you follow up with the A fib clinic you were previously referred to. We cannot proceed with scheduling upper endoscopy and colonoscopy until you are stable from a cardiac standpoint.  Please reach out to [place] once you have been seen by the A fib clinic in hopes that we can get you scheduled for these procedures if they clear you as safe to do so.  In regards to constipation, we will check thyroid function, continue trulance and add benefiber (or generic) 1T three times per day with meals. Make sure to continue with plenty of water intake, diet high in fruits, veggies and whole grains  In regards to to swallowing, please take small bites, chew thoroughly, sips of liquids between bites and avoid thicker, dryer foods.  Follow up in 3 months

## 2021-10-02 NOTE — Progress Notes (Signed)
Referring Provider: Glenda Chroman, MD Primary Care Physician:  Glenda Chroman, MD Primary GI Physician: new  Chief Complaint  Patient presents with   Constipation    Patient referred for constipation.taking trulance daily and works off and on.  Also having trouble swallowing. States he had his esophagus stretched about 4 years ago and thinks it needs to be again.    HPI:   Jason Woods is a 76 y.o. male with past medical history of anxiety, arthritis, Depression, GERD, high cholesterol, HTN, DM Type II, B12 deficiency,   Patient presenting today as a new patient for constipation, dysphagia and to schedule colonoscopy  Most recent labs done 09/13/21 with hgb 11.9, appears to be 9-11 at baseline over the past 2 years with MCV 91.8, sodium 132, potassium 4.2, chloride 95, LFTs WNL, magnesium 1.6   Notably, recent ED visit at the end of June with presence of new onset Atrial flutter with 4:1 AV block noted on EKG, pt advised to start eliquis and follow with a fib clinic, however, patient declined both of these recommendations, appears he was still referred to A fib clinic but never followed up with them.   Today, Patient and his sister state that they think he has had an irregular heartbeat for a while, though upon review, appears to have been in a NSR on previous EGKs until most recently one. Patient states that he has had his esophagus stretched in the past, he is having issues with foods getting stuck from sternal notch down to mid chest almost daily. Reports he had episode of food impaction within the past few years where he had to have an endoscopy to remove food, this was reportedly done at [facility] but no records in chart regarding this? He has some issues with pills getting stuck as well but usually these will pass with water. He denies changes in appetite or weight loss. No issues with heartburn currently, on daily protonix '40mg'$ . Denies abdominal pain.  Also with episodes of constipation,  sometimes he can go 4-5 days without a BM then may go twice a day for a few days then will go back to a few days of constipation. He is on trulance for the past 1-2 months, he is taking this daily without much improvement in his constipation. He drinks about 3 bottles of water per day. Diet consists of oatmeal or cereal in the morning. Gets a decent amount of fruits and veggies in his diet. Denies rectal bleeding or black stools. Constipation has been ongoing for the past 2 years. He takes percocet PRN for back pain maybe a few times per month but is on subutex daily. He has never had a colonoscopy and is interested in getting this scheduled.   NSAID use: usually just tylenol  Social hx: occasional tobacco use  Fam hx: no liver disease or CRC  Last Colonoscopy:never Last Endoscopy:04/23/17 (no urgent EGD for food impaction listed in chart) Web in the proximal esophagus. - Benign-appearing esophageal stenosis. Dilated. - 2 cm hiatal hernia. - A medium amount of food (residue) in the stomach. It was broken up with snare. - Discoloration patches mucosa in the prepyloric region of the stomach and pylorus suggestive of intestinal metaplasia. Biopsied. - Normal duodenal bulb and second portion of the duodenum.  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    Depression    GERD (gastroesophageal reflux disease)    High cholesterol    History of kidney stones    Hypertension    Legally blind    Skin cancer    "frozen off my face, arms" (01/07/2018)   Type II diabetes mellitus ([place])    Vitamin B deficiency 03/09/2017    Past Surgical History:  Procedure Laterality Date   ANKLE FUSION Left 01/12/2018   Procedure: LEFT ANKLE REMOVAL OF  EXTERNAL FIXATOR, ANKLE FUSION WITH PHOENIX NAIL;  Surgeon: [name], MD;  Location: [place];  Service: Orthopedics;  Laterality: Left;   ANKLE HARDWARE REMOVAL Left 01/07/2018   REMOVAL OF HARDWARE, TAKE DOWN OF NONUNION, INSERTION OF ANTIBIOTC BEADS/notes 01/07/2018   BIOPSY  04/23/2017   Procedure: BIOPSY;  Surgeon: Rogene Houston, MD;  [facility];  Service: Endoscopy;;  gastric   CATARACT EXTRACTION W/ INTRAOCULAR LENS  IMPLANT, BILATERAL Bilateral    ESOPHAGEAL DILATION N/A 04/23/2017   Procedure: ESOPHAGEAL DILATION;  Surgeon: Rogene Houston, MD;  [facility];  Service: Endoscopy;  Laterality: N/A;   ESOPHAGOGASTRODUODENOSCOPY N/A 04/23/2017   Procedure: ESOPHAGOGASTRODUODENOSCOPY (EGD);  Surgeon: Rogene Houston, MD;  [facility];  Service: Endoscopy;  Laterality: N/A;  9:55-moved to 1040 per Ann   EXTERNAL FIXATION LEG Left 01/07/2018   EXTERNAL FIXATION LEG, TIBIA TO CALCANEUS/notes 01/07/2018   EXTERNAL FIXATION LEG Left 01/07/2018   Procedure: EXTERNAL FIXATION LEG, TIBIA TO CALCANEUS;  Surgeon: [name], MD;  Location: [place];  Service: Orthopedics;  Laterality: Left;   EXTERNAL FIXATION REMOVAL Left 01/12/2018   Procedure: REMOVAL EXTERNAL FIXATION ANKLE;  Surgeon: [name], MD;  Location: [place];  Service: Orthopedics;  Laterality: Left;   FRACTURE SURGERY     HARDWARE REMOVAL Left 01/07/2018   Procedure: LEFT ANKLE REMOVAL OF HARDWARE, TAKE DOWN OF NONUNION, INSERTION OF ANTIBIOTC BEADS;  Surgeon: [name], MD;  Location: [place];  Service: Orthopedics;  Laterality: Left;   HARDWARE REMOVAL Left 12/01/2018   Procedure: Left calcaneus, talus, and tibia removal of deep implants; irrigation and excisional debridement;  Surgeon: Wylene Simmer, MD;  Location: [place];  Service: Orthopedics;  Laterality: Left;   I & D EXTREMITY Left 10/05/2017   Procedure: IRRIGATION AND DEBRIDEMENT QPEN ANKLE FRACTURE;  Surgeon: [name], MD;   Location: [place];  Service: Orthopedics;  Laterality: Left;   INNER EAR SURGERY Right 1962   "scraped the bone; couldn't hear out of it; was infected"   MULTIPLE TOOTH EXTRACTIONS  2018   ORIF ANKLE FRACTURE Left 10/05/2017   Procedure: OPEN REDUCTION INTERNAL FIXATION TRIMALLEOLAR (ORIF)ANKLE FRACTURE;  Surgeon: [name], MD;  Location: [place];  Service: Orthopedics;  Laterality: Left;   WRIST FRACTURE SURGERY Left 2018    Current Outpatient Medications  Medication Sig Dispense Refill   albuterol (VENTOLIN HFA) 108 (90 Base) MCG/ACT inhaler Inhale 1-2 puffs into the lungs every 6 (six) hours as needed for wheezing or shortness of breath. 1 each 0   ALPRAZolam (XANAX) 1 MG tablet Take 0.5 tablets (0.5 mg total) by mouth 3 (three) times daily. 30 tablet    b complex vitamins tablet Take 1 tablet by mouth daily. With C     bimatoprost (LUMIGAN) 0.01 % SOLN Place 1  drop into the right eye at bedtime.     BREO ELLIPTA 100-25 MCG/ACT AEPB Inhale 1 puff into the lungs daily.     buprenorphine (SUBUTEX) 2 MG SUBL SL tablet Place 2 mg under the tongue daily.     cetirizine (ZYRTEC) 10 MG tablet Take 10 mg by mouth daily.     Cholecalciferol 50 MCG (2000 UT) TABS Take 2,000 Units by mouth daily.     cyanocobalamin 2000 MCG tablet Take 2,000 mcg by mouth daily.     docusate sodium (COLACE) 100 MG capsule Take 1 capsule (100 mg total) by mouth 2 (two) times daily. While taking narcotic pain medicine. 30 capsule 0   dorzolamide (TRUSOPT) 2 % ophthalmic solution Place 1 drop into both eyes 3 (three) times daily.   3   DULoxetine (CYMBALTA) 30 MG capsule Take 30 mg by mouth daily.   90   fluticasone (FLONASE) 50 MCG/ACT nasal spray Place 2 sprays into both nostrils daily.      gabapentin (NEURONTIN) 400 MG capsule Take 400 mg by mouth 2 (two) times daily.     HUMALOG KWIKPEN 100 UNIT/ML KwikPen Inject 5 Units into the skin in the morning, at noon, and at bedtime.     hydrochlorothiazide (HYDRODIURIL)  25 MG tablet Take 25 mg by mouth daily.     hydrOXYzine (ATARAX) 25 MG tablet Take 25 mg by mouth at bedtime.     insulin glargine (LANTUS) 100 UNIT/ML injection Inject 0.1-0.18 mLs (10-18 Units total) into the skin See admin instructions. Per sliding  16 units in the morning and 8-10 units in the evening     ipratropium-albuterol (DUONEB) 0.5-2.5 (3) MG/3ML SOLN Take 3 mLs by nebulization every 6 (six) hours as needed for shortness of breath.     ketoconazole (NIZORAL) 2 % cream Apply 1 application topically daily.     losartan (COZAAR) 100 MG tablet Take 100 mg by mouth daily.     metFORMIN (GLUCOPHAGE) 850 MG tablet Take 850 mg by mouth 2 (two) times daily with a meal.     metoprolol succinate (TOPROL-XL) 25 MG 24 hr tablet Take 25 mg by mouth daily.     Multiple Vitamin (MULTIVITAMIN WITH MINERALS) TABS tablet Take 1 tablet by mouth daily.     oxyCODONE-acetaminophen (PERCOCET) 10-325 MG tablet Take 1 tablet by mouth 2 (two) times daily as needed for pain.     pantoprazole (PROTONIX) 40 MG tablet Take 40 mg by mouth daily.     rosuvastatin (CRESTOR) 5 MG tablet Take 5 mg by mouth once a week.     SYMBICORT 160-4.5 MCG/ACT inhaler Inhale 2 puffs into the lungs in the morning and at bedtime.     traZODone (DESYREL) 100 MG tablet Take 200 mg by mouth at bedtime as needed for sleep.     triamcinolone cream (KENALOG) 0.1 % Apply 1 application topically daily as needed (affected area(s) on skin).   5   TRULANCE 3 MG TABS Take 1 tablet by mouth daily.     No current facility-administered medications for this visit.    Allergies as of 10/02/2021 - Review Complete 10/02/2021  Allergen Reaction Noted   Lipitor [atorvastatin] Other (See Comments) 03/09/2017   Zocor [simvastatin-high dose] Other (See Comments) 03/09/2017   Celexa [citalopram hydrobromide] Other (See Comments) 03/09/2017   Crestor [rosuvastatin]  03/09/2017   Mobic [meloxicam] Other (See Comments) 03/09/2017   Prozac [fluoxetine  hcl] Other (See Comments) 03/09/2017    Family History  Problem  Relation Age of Onset   Diabetes Mother    Heart attack Father    Diabetes Sister    Diabetes Brother     Social History   Socioeconomic History   Marital status: Widowed    Spouse name: Not on file   Number of children: Not on file   Years of education: Not on file   Highest education level: Not on file  Occupational History   Not on file  Tobacco Use   Smoking status: Every Day    Packs/day: 1.00    Years: 62.00    Total pack years: 62.00    Types: Cigarettes    Passive exposure: Current   Smokeless tobacco: Never  Vaping Use   Vaping Use: Never used  Substance and Sexual Activity   Alcohol use: Not Currently    Comment:  "nothing since the 1990s"   Drug use: Not Currently    Comment:   "nothing since 1980s"   Sexual activity: Not Currently  Other Topics Concern   Not on file  Social History Narrative   Not on file   Social Determinants of Health   Financial Resource Strain: Not on file  Food Insecurity: Not on file  Transportation Needs: Not on file  Physical Activity: Not on file  Stress: Not on file  Social Connections: Not on file   Review of systems General: negative for malaise, night sweats, fever, chills, weight loss Neck: Negative for lumps, goiter, pain and significant neck swelling Resp: Negative for cough, wheezing, dyspnea at rest CV: Negative for chest pain, leg swelling, palpitations, orthopnea GI: denies melena, hematochezia, nausea, vomiting, diarrhea, odyonophagia, early satiety or unintentional weight loss. +constipation +dysphagia  MSK: Negative for joint pain or swelling, back pain, and muscle pain. Derm: Negative for itching or rash Psych: Denies depression, anxiety, memory loss, confusion. No homicidal or suicidal ideation.  Heme: Negative for prolonged bleeding, bruising easily, and swollen nodes. Endocrine: Negative for cold or heat intolerance, polyuria, polydipsia  and goiter. Neuro: negative for tremor, gait imbalance, syncope and seizures. The remainder of the review of systems is noncontributory.  Physical Exam: BP 121/69 (BP Location: Left Arm, Patient Position: Sitting, Cuff Size: Normal)   Pulse 70   Temp 98.3 F (36.8 C) (Oral)   Ht '5\' 7"'$  (1.702 m)   Wt 168 lb (76.2 kg) Comment: per patient. wearing a boot and did not want to weight with it on  BMI 26.31 kg/m  General:   Alert and oriented. No distress noted. Pleasant and cooperative.  Head:  Normocephalic and atraumatic. Eyes:  Conjuctiva clear without scleral icterus. Mouth:  Oral mucosa pink and moist. Good dentition. No lesions. Heart: Normal rate and rhythm, s1 and s2 heart sounds present.  Lungs: Clear lung sounds in all lobes. Respirations equal and unlabored. Abdomen:  +BS, soft, non-tender and non-distended. No rebound or guarding. No HSM or masses noted. Derm: No palmar erythema or jaundice Msk:  Symmetrical without gross deformities. Normal posture. Extremities:  Without edema. Neurologic:  Alert and  oriented x4 Psych:  Alert and cooperative. Normal mood and affect.  Invalid input(s): "6 MONTHS"   ASSESSMENT: Jason Woods is a 76 y.o. male presenting today as a new patient for constipation, dysphagia and to schedule colonoscopy.  Constipation: x2 years, on trulance for the past 1-2 months without much improvement. Can go up to 4-5 days without BM, then will go a few days having 2 BMs per day, then back to being constipated for  a few days. Denies rectal bleeding, melena, or weight loss. No recent thyroid testing done. Will check TSH. Can continue on trulance at this time, may consider motegrity if no improvement with addition of fiber as constipation likely secondary to opiate use. Will add benefiber TID, should increase water intake, fruits, veggies, whole grains.   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 He denies issues with pill dysphagia, GERD well controlled on pantoprazole. 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  Given patient has never had a colonoscopy, discussed proceeding with colonoscopy for colon cancer screening to which patient is amenable to.  While the above endoscopic procedures are being recommended, I had a thorough discussion with the patient and his sister regarding his new onset Atrial flutter. Previous EKGs over the past few years with presence of NSR. This is a new cardiac complication. Patient has not followed up with the a fib clinic and is not currently on a blood thinner. Given this new finding, he will have to be evaluated by cardiology with the afib clinic and cleared by them prior to [place] scheduling any procedures. Patient and his sister advise that patient has had a leaky heart valve for many years. I again, discussed with them that patient has new onset of an irregular heart rhythm, putting him at higher risk for developing blood clots, stroke and other cardiac related complications. It would be unsafe for him to be sedated and undergo endoscopic procedures at this time without cardiology evaluation/management. Once he sees cardiology and is cleared by them, we can proceed with ordering EGD/Colonoscopy. Patient and his sister verbalized understanding.  PLAN:  Will need cardiology eval (a fib clinic)prior to colonoscopy/EGD being scheduled 2. TSH 3. Be mindful that opiate pain meds are likely contributing to constipation 4. 1T Benefiber TID with meals 5. Increase water intake, fruits, veggies, whole grains.  6. Chewing precautions (small bites, chew thoroughly, avoid thicker dryer foods, sips between bites) 7. continue trulance, consider motegrity if addition of fiber dose not provide  results  All questions were answered, patient verbalized understanding and is in agreement with plan as outlined above.   Follow Up: TBD  Owynn Mosqueda L. Alver Sorrow, MSN, APRN, AGNP-C Adult-Gerontology Nurse Practitioner [facility]

## 2021-10-03 LAB — TSH: TSH: 0.8 mIU/L (ref 0.40–4.50)

## 2021-10-04 DIAGNOSIS — Z1211 Encounter for screening for malignant neoplasm of colon: Secondary | ICD-10-CM | POA: Insufficient documentation

## 2021-10-13 DIAGNOSIS — E876 Hypokalemia: Secondary | ICD-10-CM | POA: Diagnosis not present

## 2021-10-13 DIAGNOSIS — S82892B Other fracture of left lower leg, initial encounter for open fracture type I or II: Secondary | ICD-10-CM | POA: Diagnosis not present

## 2021-10-13 DIAGNOSIS — R531 Weakness: Secondary | ICD-10-CM | POA: Diagnosis not present

## 2021-10-13 DIAGNOSIS — G9341 Metabolic encephalopathy: Secondary | ICD-10-CM | POA: Diagnosis not present

## 2021-10-16 ENCOUNTER — Ambulatory Visit (HOSPITAL_COMMUNITY)
Admission: RE | Admit: 2021-10-16 | Discharge: 2021-10-16 | Disposition: A | Discharge status: Home / Self Care | Payer: Medicare Other | Source: Ambulatory Visit | Attending: Physician Assistant | Admitting: Physician Assistant

## 2021-10-16 ENCOUNTER — Encounter (HOSPITAL_COMMUNITY): Payer: Self-pay | Admitting: Physician Assistant

## 2021-10-16 VITALS — BP 140/78 | HR 77 | Ht 67.0 in | Wt 162.4 lb

## 2021-10-16 DIAGNOSIS — I10 Essential (primary) hypertension: Secondary | ICD-10-CM | POA: Diagnosis not present

## 2021-10-16 DIAGNOSIS — E119 Type 2 diabetes mellitus without complications: Secondary | ICD-10-CM | POA: Diagnosis not present

## 2021-10-16 DIAGNOSIS — Z7901 Long term (current) use of anticoagulants: Secondary | ICD-10-CM | POA: Diagnosis not present

## 2021-10-16 DIAGNOSIS — Z79899 Other long term (current) drug therapy: Secondary | ICD-10-CM | POA: Diagnosis not present

## 2021-10-16 DIAGNOSIS — H547 Unspecified visual loss: Secondary | ICD-10-CM | POA: Diagnosis not present

## 2021-10-16 DIAGNOSIS — J449 Chronic obstructive pulmonary disease, unspecified: Secondary | ICD-10-CM | POA: Insufficient documentation

## 2021-10-16 DIAGNOSIS — K5900 Constipation, unspecified: Secondary | ICD-10-CM | POA: Diagnosis not present

## 2021-10-16 DIAGNOSIS — I484 Atypical atrial flutter: Secondary | ICD-10-CM | POA: Diagnosis not present

## 2021-10-16 DIAGNOSIS — F17210 Nicotine dependence, cigarettes, uncomplicated: Secondary | ICD-10-CM | POA: Diagnosis not present

## 2021-10-16 DIAGNOSIS — D6869 Other thrombophilia: Secondary | ICD-10-CM | POA: Diagnosis not present

## 2021-10-16 MED ORDER — APIXABAN 5 MG PO TABS: 5.0000 mg | ORAL_TABLET | Freq: Two times a day (BID) | ORAL | 3 refills | Status: DC

## 2021-10-16 NOTE — Patient Instructions (Signed)
Start Eliquis 5mg twice a day 

## 2021-10-16 NOTE — Progress Notes (Signed)
Primary Care Physician: Glenda Chroman, MD Primary Cardiologist: none Primary Electrophysiologist: none Referring Physician: [facility]   [facility] is a 76 y.o. male with a history of HTN, DM, COPD, tobacco abuse, blindness, atrial flutter who presents for consultation in the [facility].  The patient was initially diagnosed with atrial flutter after presenting to the ED after a fall. No head trauma or LOC but patient's sister did report he has had several falls. His atrial flutter was rate controlled. Patient has a CHADS2VASC score of 4. Patient declined to start anticoagulation at that time. Patient remains in rate controlled atrial flutter today. He is unaware of his arrhythmia. His primary concern is his constipation, followed by GI.   Today, he denies symptoms of palpitations, chest pain, shortness of breath, orthopnea, PND, lower extremity edema, dizziness, presyncope, syncope, snoring, daytime somnolence, bleeding, or neurologic sequela. The patient is tolerating medications without difficulties and is otherwise without complaint today.    Atrial Fibrillation Risk Factors:  he does not have symptoms or diagnosis of sleep apnea. he does not have a history of rheumatic fever. The patient does not have a history of early familial atrial fibrillation or other arrhythmias.  he has a BMI of Body mass index is 25.44 kg/m.[name] Filed Weights   10/16/21 1117  Weight: 73.7 kg    Family History  Problem Relation Age of Onset   Diabetes Mother    Heart attack Father    Diabetes Sister    Diabetes Brother      Atrial Fibrillation Management history:  Previous antiarrhythmic drugs: none Previous cardioversions: none Previous ablations: none CHADS2VASC score: 4 Anticoagulation history: none   Past Medical History:  Diagnosis Date   Anxiety    "about all my life" (01/07/2018)   Arthritis    "shoulders, back, hips" (01/07/2018)   CAP  (community acquired pneumonia) 04/2017   Chronic back pain    "upper and lower" (01/07/2018)   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    Depression    GERD (gastroesophageal reflux disease)    High cholesterol    History of kidney stones    Hypertension    Legally blind    Skin cancer    "frozen off my face, arms" (01/07/2018)   Type II diabetes mellitus ([place])    Vitamin B deficiency 03/09/2017   Past Surgical History:  Procedure Laterality Date   ANKLE FUSION Left 01/12/2018   Procedure: LEFT ANKLE REMOVAL OF EXTERNAL FIXATOR, ANKLE FUSION WITH PHOENIX NAIL;  Surgeon: [name], MD;  Location: [facility];  Service: Orthopedics;  Laterality: Left;   ANKLE HARDWARE REMOVAL Left 01/07/2018   REMOVAL OF HARDWARE, TAKE DOWN OF NONUNION, INSERTION OF ANTIBIOTC BEADS/notes 01/07/2018   BIOPSY  04/23/2017   Procedure: BIOPSY;  Surgeon: Rogene Houston, MD;  [facility];  Service: Endoscopy;;  gastric   CATARACT EXTRACTION W/ INTRAOCULAR LENS  IMPLANT, BILATERAL Bilateral    ESOPHAGEAL DILATION N/A 04/23/2017   Procedure: ESOPHAGEAL DILATION;  Surgeon: Rogene Houston, MD;  [facility];  Service: Endoscopy;  Laterality: N/A;   ESOPHAGOGASTRODUODENOSCOPY N/A 04/23/2017   Procedure: ESOPHAGOGASTRODUODENOSCOPY (EGD);  Surgeon: Rogene Houston, MD;  [facility];  Service: Endoscopy;  Laterality: N/A;  9:55-moved to 1040 per Ann   EXTERNAL FIXATION LEG Left 01/07/2018   EXTERNAL FIXATION LEG, TIBIA TO CALCANEUS/notes 01/07/2018   EXTERNAL FIXATION LEG Left 01/07/2018   Procedure: EXTERNAL  FIXATION LEG, TIBIA TO CALCANEUS;  Surgeon: [name], MD;  Location: [facility];  Service: Orthopedics;  Laterality: Left;   EXTERNAL FIXATION REMOVAL Left 01/12/2018   Procedure: REMOVAL EXTERNAL FIXATION ANKLE;  Surgeon: [name], MD;  Location: [place];  Service: Orthopedics;  Laterality: Left;   FRACTURE SURGERY     HARDWARE REMOVAL Left 01/07/2018   Procedure: LEFT  ANKLE REMOVAL OF HARDWARE, TAKE DOWN OF NONUNION, INSERTION OF ANTIBIOTC BEADS;  Surgeon: [name], MD;  Location: [place] [facility];  Service: Orthopedics;  Laterality: Left;   HARDWARE REMOVAL Left 12/01/2018   Procedure: Left calcaneus, talus, and tibia removal of deep implants; irrigation and excisional debridement;  Surgeon: Wylene Simmer, MD;  Location: [place];  Service: Orthopedics;  Laterality: Left;   I & D EXTREMITY Left 10/05/2017   Procedure: IRRIGATION AND DEBRIDEMENT QPEN ANKLE FRACTURE;  Surgeon: [name], MD;  Location: [place];  Service: Orthopedics;  Laterality: Left;   INNER EAR SURGERY Right 1962   "scraped the bone; couldn't hear out of it; was infected"   MULTIPLE TOOTH EXTRACTIONS  2018   ORIF ANKLE FRACTURE Left 10/05/2017   Procedure: OPEN REDUCTION INTERNAL FIXATION TRIMALLEOLAR (ORIF)ANKLE FRACTURE;  Surgeon: [name], MD;  Location: [facility];  Service: Orthopedics;  Laterality: Left;   WRIST FRACTURE SURGERY Left 2018    Current Outpatient Medications  Medication Sig Dispense Refill   albuterol (VENTOLIN HFA) 108 (90 Base) MCG/ACT inhaler Inhale 1-2 puffs into the lungs every 6 (six) hours as needed for wheezing or shortness of breath. 1 each 0   ALPRAZolam (XANAX) 1 MG tablet Take 0.5 tablets (0.5 mg total) by mouth 3 (three) times daily. 30 tablet    b complex vitamins tablet Take 1 tablet by mouth daily. With C     bimatoprost (LUMIGAN) 0.01 % SOLN Place 1 drop into the right eye at bedtime.     buprenorphine (SUBUTEX) 2 MG SUBL SL tablet Place 2 mg under the tongue daily.     cetirizine (ZYRTEC) 10 MG tablet Take 10 mg by mouth daily.     Cholecalciferol 50 MCG (2000 UT) TABS Take 2,000 Units by mouth daily.     Continuous Blood Gluc Receiver (FREESTYLE LIBRE 2 READER) DEVI daily.     Continuous Blood Gluc Sensor (FREESTYLE LIBRE 2 SENSOR) MISC SMARTSIG:Via Meter Every 10 Days     cyanocobalamin 2000 MCG tablet Take 2,000 mcg by mouth daily.     docusate sodium  (COLACE) 100 MG capsule Take 1 capsule (100 mg total) by mouth 2 (two) times daily. While taking narcotic pain medicine. 30 capsule 0   dorzolamide (TRUSOPT) 2 % ophthalmic solution Place 1 drop into both eyes 3 (three) times daily.   3   DULoxetine (CYMBALTA) 30 MG capsule Take 30 mg by mouth daily.   90   fluticasone (FLONASE) 50 MCG/ACT nasal spray Place 2 sprays into both nostrils daily.      gabapentin (NEURONTIN) 400 MG capsule Take 400 mg by mouth 2 (two) times daily.     HUMALOG KWIKPEN 100 UNIT/ML KwikPen Inject 5 Units into the skin in the morning, at noon, and at bedtime.     hydrochlorothiazide (HYDRODIURIL) 25 MG tablet Take 25 mg by mouth daily.     hydrOXYzine (ATARAX) 25 MG tablet Take 25 mg by mouth at bedtime.     insulin glargine (LANTUS) 100 UNIT/ML injection Inject 0.1-0.18 mLs (10-18 Units total) into the skin See admin  instructions. Per sliding  16 units in the morning and 8-10 units in the evening     ipratropium-albuterol (DUONEB) 0.5-2.5 (3) MG/3ML SOLN Take 3 mLs by nebulization every 6 (six) hours as needed for shortness of breath.     ketoconazole (NIZORAL) 2 % cream Apply 1 application topically daily.     losartan (COZAAR) 100 MG tablet Take 100 mg by mouth daily.     metFORMIN (GLUCOPHAGE) 850 MG tablet Take 850 mg by mouth 2 (two) times daily with a meal.     metoprolol succinate (TOPROL-XL) 25 MG 24 hr tablet Take 25 mg by mouth daily.     Multiple Vitamin (MULTIVITAMIN WITH MINERALS) TABS tablet Take 1 tablet by mouth daily.     oxyCODONE-acetaminophen (PERCOCET) 10-325 MG tablet Take 1 tablet by mouth 2 (two) times daily as needed for pain.     pantoprazole (PROTONIX) 40 MG tablet Take 40 mg by mouth daily.     rosuvastatin (CRESTOR) 5 MG tablet Take 5 mg by mouth once a week.     SYMBICORT 160-4.5 MCG/ACT inhaler Inhale 2 puffs into the lungs in the morning and at bedtime.     traZODone (DESYREL) 100 MG tablet Take 200 mg by mouth at bedtime as needed for  sleep.     triamcinolone cream (KENALOG) 0.1 % Apply 1 application topically daily as needed (affected area(s) on skin).   5   TRULANCE 3 MG TABS Take 1 tablet by mouth daily.     No current facility-administered medications for this encounter.    Allergies  Allergen Reactions   Lipitor [Atorvastatin] Other (See Comments)    Aching, MS   Zocor [Simvastatin-High Dose] Other (See Comments)    MS aches   Celexa [Citalopram Hydrobromide] Other (See Comments)    Nausea,blurred vision   Crestor [Rosuvastatin]     Pain, aching muscles   Mobic [Meloxicam] Other (See Comments)    nausea   Prozac [Fluoxetine Hcl] Other (See Comments)    fatigue    Social History   Socioeconomic History   Marital status: Widowed    Spouse name: Not on file   Number of children: Not on file   Years of education: Not on file   Highest education level: Not on file  Occupational History   Not on file  Tobacco Use   Smoking status: Every Day    Packs/day: 1.00    Years: 62.00    Total pack years: 62.00    Types: Cigarettes    Passive exposure: Current   Smokeless tobacco: Never   Tobacco comments:    1 pack a month 10/16/21  Vaping Use   Vaping Use: Never used  Substance and Sexual Activity   Alcohol use: Not Currently    Comment:  "nothing since the 1990s"   Drug use: Not Currently    Comment:   "nothing since 1980s"   Sexual activity: Not Currently  Other Topics Concern   Not on file  Social History Narrative   Not on file   Social Determinants of Health   Financial Resource Strain: Not on file  Food Insecurity: Not on file  Transportation Needs: Not on file  Physical Activity: Not on file  Stress: Not on file  Social Connections: Not on file  Intimate Partner Violence: Not on file     ROS- All systems are reviewed and negative except as per the HPI above.  Physical Exam: Vitals:   10/16/21 1117  BP: 140/78  Pulse: 77  Weight: 73.7 kg  Height: '5\' 7"'$  (1.702 m)    GEN-  The patient is a well appearing elderly male, alert and oriented x 3 today.   Head- normocephalic, atraumatic Eyes-  Sclera clear, conjunctiva pink Ears- hearing intact Oropharynx- clear Neck- supple  Lungs- Clear to ausculation bilaterally, normal work of breathing Heart- Regular rate and rhythm, no murmurs, rubs or gallops  GI- soft, NT, ND, + BS Extremities- no clubbing, cyanosis, or edema MS- no significant deformity or atrophy Skin- no rash or lesion Psych- euthymic mood, full affect Neuro- strength and sensation are intact  Wt Readings from Last 3 Encounters:  10/16/21 73.7 kg  10/02/21 76.2 kg  09/13/21 76.7 kg    EKG today demonstrates  Atrial flutter with 4:1 block Vent. rate 76 BPM PR interval * ms QRS duration 96 ms QT/QTcB 390/438 ms  Epic records are reviewed at length today  CHA2DS2-VASc Score = 4  The patient's score is based upon: CHF History: 0 HTN History: 1 Diabetes History: 1 Stroke History: 0 Vascular Disease History: 0 Age Score: 2 Gender Score: 0        ASSESSMENT AND PLAN: 1. Atypical atrial flutter The patient's CHA2DS2-VASc score is 4, indicating a 4.8% annual risk of stroke.   General education about atrial flutter provided and questions answered. We also discussed his stroke risk and the risks and benefits of anticoagulation. Start Eliquis 5 mg BID. Fall precautions discussed. Check echocardiogram Patient is rate controlled and asymptomatic. He is very eager to have a colonoscopy. Will not pursue rhythm control at this time as he would not be able to hold anticoagulation for 3 weeks before DCCV and 4 weeks after which would delay his colonoscopy. Will refer to establish care with a primary cardiologist in [facility] for follow up and cardiac clearance.  Continue Toprol 25 mg daily  2. Secondary Hypercoagulable State (ICD10:  D68.69) The patient is at significant risk for stroke/thromboembolism based upon his CHA2DS2-VASc Score of 4.  Start  Apixaban (Eliquis).   3. HTN Stable, no changes today.  4. Constipation Plans per GI   Follow up to establish care with cardiology in [place].    [facility] [address] [place], [place] [address] [phone] 10/16/2021 11:28 AM

## 2021-10-20 DIAGNOSIS — E1122 Type 2 diabetes mellitus with diabetic chronic kidney disease: Secondary | ICD-10-CM | POA: Diagnosis not present

## 2021-10-20 DIAGNOSIS — I4891 Unspecified atrial fibrillation: Secondary | ICD-10-CM | POA: Diagnosis not present

## 2021-10-20 DIAGNOSIS — J449 Chronic obstructive pulmonary disease, unspecified: Secondary | ICD-10-CM | POA: Diagnosis not present

## 2021-10-20 DIAGNOSIS — Z9181 History of falling: Secondary | ICD-10-CM | POA: Diagnosis not present

## 2021-10-20 DIAGNOSIS — I700 Atherosclerosis of aorta: Secondary | ICD-10-CM | POA: Diagnosis not present

## 2021-10-20 DIAGNOSIS — S51811D Laceration without foreign body of right forearm, subsequent encounter: Secondary | ICD-10-CM | POA: Diagnosis not present

## 2021-10-20 DIAGNOSIS — N1832 Chronic kidney disease, stage 3b: Secondary | ICD-10-CM | POA: Diagnosis not present

## 2021-10-20 DIAGNOSIS — Z794 Long term (current) use of insulin: Secondary | ICD-10-CM | POA: Diagnosis not present

## 2021-10-20 DIAGNOSIS — Z9981 Dependence on supplemental oxygen: Secondary | ICD-10-CM | POA: Diagnosis not present

## 2021-10-24 ENCOUNTER — Encounter (HOSPITAL_COMMUNITY): Payer: Self-pay | Admitting: Emergency Medicine

## 2021-10-24 ENCOUNTER — Other Ambulatory Visit: Payer: Self-pay

## 2021-10-24 ENCOUNTER — Emergency Department (HOSPITAL_COMMUNITY)
Admission: EM | Admit: 2021-10-24 | Discharge: 2021-10-24 | Disposition: A | Discharge status: Home / Self Care | Payer: Medicare Other | Attending: Emergency Medicine | Admitting: Emergency Medicine

## 2021-10-24 ENCOUNTER — Emergency Department (HOSPITAL_COMMUNITY): Payer: Medicare Other

## 2021-10-24 DIAGNOSIS — Z7901 Long term (current) use of anticoagulants: Secondary | ICD-10-CM | POA: Diagnosis not present

## 2021-10-24 DIAGNOSIS — Z794 Long term (current) use of insulin: Secondary | ICD-10-CM | POA: Diagnosis not present

## 2021-10-24 DIAGNOSIS — W19XXXA Unspecified fall, initial encounter: Secondary | ICD-10-CM | POA: Insufficient documentation

## 2021-10-24 DIAGNOSIS — Z79899 Other long term (current) drug therapy: Secondary | ICD-10-CM | POA: Insufficient documentation

## 2021-10-24 DIAGNOSIS — Z7984 Long term (current) use of oral hypoglycemic drugs: Secondary | ICD-10-CM | POA: Diagnosis not present

## 2021-10-24 DIAGNOSIS — S0093XA Contusion of unspecified part of head, initial encounter: Secondary | ICD-10-CM | POA: Insufficient documentation

## 2021-10-24 DIAGNOSIS — S0990XA Unspecified injury of head, initial encounter: Secondary | ICD-10-CM

## 2021-10-24 DIAGNOSIS — G319 Degenerative disease of nervous system, unspecified: Secondary | ICD-10-CM | POA: Diagnosis not present

## 2021-10-24 NOTE — ED Provider Notes (Signed)
[facility] Provider Note   CSN: [id number] Arrival date & time: 10/24/21  0543     History  Chief Complaint  Patient presents with   [facility] is a 76 y.o. male.  Patient presents to the emergency department for evaluation after a fall.  Patient got up to go to the bathroom, lost his balance and fell backwards.  He hit the back of his head on the ground.  No loss of consciousness but he does take Eliquis.  Denies any other injury.       Home Medications Prior to Admission medications   Medication Sig Start Date End Date Taking? Authorizing Provider  albuterol (VENTOLIN HFA) 108 (90 Base) MCG/ACT inhaler Inhale 1-2 puffs into the lungs every 6 (six) hours as needed for wheezing or shortness of breath. 07/15/21   Smoot, Sarah A, PA-C  ALPRAZolam Duanne Moron) 1 MG tablet Take 0.5 tablets (0.5 mg total) by mouth 3 (three) times daily. 09/15/20   Johnson, Clanford L, MD  apixaban (ELIQUIS) 5 MG TABS tablet Take 1 tablet (5 mg total) by mouth 2 (two) times daily. 10/16/21   Fenton, Clint R, PA  b complex vitamins tablet Take 1 tablet by mouth daily. With C    [provider]  bimatoprost (LUMIGAN) 0.01 % SOLN Place 1 drop into the right eye at bedtime.    [provider]  buprenorphine (SUBUTEX) 2 MG SUBL SL tablet Place 2 mg under the tongue daily. 09/12/21   [provider]  cetirizine (ZYRTEC) 10 MG tablet Take 10 mg by mouth daily. 09/06/21   [provider]  Cholecalciferol 50 MCG (2000 UT) TABS Take 2,000 Units by mouth daily.    [provider]  Continuous Blood Gluc Receiver (FREESTYLE LIBRE 2 READER) DEVI daily. 09/30/21   [provider]  Continuous Blood Gluc Sensor (FREESTYLE LIBRE 2 SENSOR) MISC SMARTSIG:Via Meter Every 10 Days 09/29/21   [provider]  cyanocobalamin 2000 MCG tablet Take 2,000 mcg by mouth daily.    [provider]  docusate sodium (COLACE) 100 MG capsule  Take 1 capsule (100 mg total) by mouth 2 (two) times daily. While taking narcotic pain medicine. 12/06/18   Corky Sing, PA-C  dorzolamide (TRUSOPT) 2 % ophthalmic solution Place 1 drop into both eyes 3 (three) times daily.  04/15/17   [provider]  DULoxetine (CYMBALTA) 30 MG capsule Take 30 mg by mouth daily.  10/11/17   [provider]  fluticasone (FLONASE) 50 MCG/ACT nasal spray Place 2 sprays into both nostrils daily.     [provider]  gabapentin (NEURONTIN) 400 MG capsule Take 400 mg by mouth 2 (two) times daily. 09/06/21   [provider]  HUMALOG KWIKPEN 100 UNIT/ML KwikPen Inject 5 Units into the skin in the morning, at noon, and at bedtime. 09/06/21   [provider]  hydrochlorothiazide (HYDRODIURIL) 25 MG tablet Take 25 mg by mouth daily. 09/06/21   [provider]  hydrOXYzine (ATARAX) 25 MG tablet Take 25 mg by mouth at bedtime. 09/06/21   [provider]  insulin glargine (LANTUS) 100 UNIT/ML injection Inject 0.1-0.18 mLs (10-18 Units total) into the skin See admin instructions. Per sliding  16 units in the morning and 8-10 units in the evening 09/15/20   Johnson, Clanford L, MD  ipratropium-albuterol (DUONEB) 0.5-2.5 (3) MG/3ML SOLN Take 3 mLs by nebulization every 6 (six) hours as needed for shortness of breath. 09/03/21  [provider]  ketoconazole (NIZORAL) 2 % cream Apply 1 application topically daily.    [provider]  losartan (COZAAR) 100 MG tablet Take 100 mg by mouth daily. 09/06/21   [provider]  metFORMIN (GLUCOPHAGE) 850 MG tablet Take 850 mg by mouth 2 (two) times daily with a meal.    [provider]  metoprolol succinate (TOPROL-XL) 25 MG 24 hr tablet Take 25 mg by mouth daily. 06/28/18   [provider]  Multiple Vitamin (MULTIVITAMIN WITH MINERALS) TABS tablet Take 1 tablet by mouth daily.    [provider]  oxyCODONE-acetaminophen  (PERCOCET) 10-325 MG tablet Take 1 tablet by mouth 2 (two) times daily as needed for pain. 08/16/21   [provider]  pantoprazole (PROTONIX) 40 MG tablet Take 40 mg by mouth daily.    [provider]  rosuvastatin (CRESTOR) 5 MG tablet Take 5 mg by mouth once a week. 09/06/21   [provider]  SYMBICORT 160-4.5 MCG/ACT inhaler Inhale 2 puffs into the lungs in the morning and at bedtime. 07/14/21   [provider]  traZODone (DESYREL) 100 MG tablet Take 200 mg by mouth at bedtime as needed for sleep. 09/08/21   [provider]  triamcinolone cream (KENALOG) 0.1 % Apply 1 application topically daily as needed (affected area(s) on skin).  04/15/17   [provider]  TRULANCE 3 MG TABS Take 1 tablet by mouth daily. 08/25/21   [provider]      Allergies    Lipitor [atorvastatin], Zocor [simvastatin-high dose], Celexa [citalopram hydrobromide], Crestor [rosuvastatin], Mobic [meloxicam], and Prozac [fluoxetine hcl]    Review of Systems   Review of Systems  Physical Exam Updated Vital Signs BP (!) 150/72 (BP Location: Right Arm)   Pulse 70   Temp 98 F (36.7 C) (Oral)   Resp 18   Ht '5\' 7"'$  (1.702 m)   Wt 74 kg   SpO2 99%   BMI 25.55 kg/m  Physical Exam Vitals and nursing note reviewed.  Constitutional:      General: He is not in acute distress.    Appearance: He is well-developed.  HENT:     Head: Normocephalic. Contusion present.      Mouth/Throat:     Mouth: Mucous membranes are moist.  Eyes:     General: Vision grossly intact. Gaze aligned appropriately.     Extraocular Movements: Extraocular movements intact.     Conjunctiva/sclera: Conjunctivae normal.  Cardiovascular:     Rate and Rhythm: Normal rate and regular rhythm.     Pulses: Normal pulses.     Heart sounds: Normal heart sounds, S1 normal and S2 normal. No murmur heard.    No friction rub. No gallop.  Pulmonary:     Effort: Pulmonary effort is normal. No  respiratory distress.     Breath sounds: Normal breath sounds.  Abdominal:     Palpations: Abdomen is soft.     Tenderness: There is no abdominal tenderness. There is no guarding or rebound.     Hernia: No hernia is present.  Musculoskeletal:        General: No swelling.     Cervical back: Full passive range of motion without pain, normal range of motion and neck supple. No pain with movement, spinous process tenderness or muscular tenderness. Normal range of motion.     Right lower leg: No edema.     Left lower leg: No edema.  Skin:    General: Skin is  warm and dry.     Capillary Refill: Capillary refill takes less than 2 seconds.     Findings: No ecchymosis, erythema, lesion or wound.  Neurological:     Mental Status: He is alert and oriented to person, place, and time.     GCS: GCS eye subscore is 4. GCS verbal subscore is 5. GCS motor subscore is 6.     Cranial Nerves: Cranial nerves 2-12 are intact.     Sensory: Sensation is intact.     Motor: Motor function is intact. No weakness or abnormal muscle tone.     Coordination: Coordination is intact.  Psychiatric:        Mood and Affect: Mood normal.        Speech: Speech normal.        Behavior: Behavior normal.     ED Results / Procedures / Treatments   Labs (all labs ordered are listed, but only abnormal results are displayed) Labs Reviewed - No data to display  EKG None  Radiology CT HEAD WO CONTRAST (5MM)  Result Date: 10/24/2021 CLINICAL DATA:  Head trauma. Status post fall. Patient is on blood thinners. EXAM: CT HEAD WITHOUT CONTRAST TECHNIQUE: Contiguous axial images were obtained from the base of the skull through the vertex without intravenous contrast. RADIATION DOSE REDUCTION: This exam was performed according to the departmental dose-optimization program which includes automated exposure control, adjustment of the mA and/or kV according to patient size and/or use of iterative reconstruction technique. COMPARISON:   09/13/2021 FINDINGS: Brain: No evidence of acute infarction, hemorrhage, hydrocephalus, extra-axial collection or mass lesion/mass effect. There is diffuse prominence of the CSF spaces overlying the cerebral and cerebellar hemispheres compatible with brain atrophy. There is mild diffuse low-attenuation within the subcortical and periventricular white matter compatible with chronic microvascular disease. Vascular: No hyperdense vessel or unexpected calcification. Skull: Normal. Negative for fracture or focal lesion. Sinuses/Orbits: No acute finding. Other: None. IMPRESSION: 1. No acute intracranial abnormalities. 2. Chronic small vessel ischemic disease and brain atrophy. Electronically Signed   By: Kerby Moors M.D.   On: 10/24/2021 06:24    Procedures Procedures    Medications Ordered in ED Medications - No data to display  ED Course/ Medical Decision Making/ A&P                           Medical Decision Making Amount and/or Complexity of Data Reviewed Radiology: ordered.   Presents to the emergency department for evaluation of minor head injury.  Patient concerned because he is on a DOAC.  He hit his head this morning after he lost his balance getting out of bed.  No loss of consciousness.  Patient awake, alert, normal neurologic exam.  CT head unremarkable.        Final Clinical Impression(s) / ED Diagnoses Final diagnoses:  Injury of head, initial encounter    Rx / DC Orders ED Discharge Orders     None         Marquavious Nazar, Gwenyth Allegra, MD 10/24/21 [phone]

## 2021-10-24 NOTE — ED Triage Notes (Signed)
Pt here after falling and hitting back of his head on floor. Wife denies LOC. States pt is on Eliquis.

## 2021-10-28 ENCOUNTER — Ambulatory Visit (INDEPENDENT_AMBULATORY_CARE_PROVIDER_SITE_OTHER): Payer: Medicare Other

## 2021-10-28 ENCOUNTER — Ambulatory Visit (HOSPITAL_COMMUNITY): Payer: Medicare Other

## 2021-10-28 DIAGNOSIS — I4891 Unspecified atrial fibrillation: Secondary | ICD-10-CM | POA: Diagnosis not present

## 2021-10-28 DIAGNOSIS — Z9181 History of falling: Secondary | ICD-10-CM | POA: Diagnosis not present

## 2021-10-28 DIAGNOSIS — I700 Atherosclerosis of aorta: Secondary | ICD-10-CM | POA: Diagnosis not present

## 2021-10-28 DIAGNOSIS — I484 Atypical atrial flutter: Secondary | ICD-10-CM

## 2021-10-28 DIAGNOSIS — Z794 Long term (current) use of insulin: Secondary | ICD-10-CM | POA: Diagnosis not present

## 2021-10-28 DIAGNOSIS — J449 Chronic obstructive pulmonary disease, unspecified: Secondary | ICD-10-CM | POA: Diagnosis not present

## 2021-10-28 DIAGNOSIS — S51811D Laceration without foreign body of right forearm, subsequent encounter: Secondary | ICD-10-CM | POA: Diagnosis not present

## 2021-10-28 DIAGNOSIS — N1832 Chronic kidney disease, stage 3b: Secondary | ICD-10-CM | POA: Diagnosis not present

## 2021-10-28 DIAGNOSIS — Z9981 Dependence on supplemental oxygen: Secondary | ICD-10-CM | POA: Diagnosis not present

## 2021-10-28 DIAGNOSIS — E1122 Type 2 diabetes mellitus with diabetic chronic kidney disease: Secondary | ICD-10-CM | POA: Diagnosis not present

## 2021-10-28 LAB — ECHOCARDIOGRAM COMPLETE
AR max vel: 2.14 cm2
AV Peak grad: 4.3 mmHg
Ao pk vel: 1.04 m/s
Area-P 1/2: 3.5 cm2
Calc EF: 39.9 %
MV M vel: 5.18 m/s
MV Peak grad: 107.1 mmHg
Radius: 0.3 cm
S' Lateral: 3.63 cm
Single Plane A2C EF: 33.5 %
Single Plane A4C EF: 44.1 %

## 2021-10-31 ENCOUNTER — Encounter (HOSPITAL_COMMUNITY): Payer: Self-pay | Admitting: *Deleted

## 2021-10-31 DIAGNOSIS — F17210 Nicotine dependence, cigarettes, uncomplicated: Secondary | ICD-10-CM | POA: Diagnosis not present

## 2021-10-31 DIAGNOSIS — Z299 Encounter for prophylactic measures, unspecified: Secondary | ICD-10-CM | POA: Diagnosis not present

## 2021-10-31 DIAGNOSIS — H548 Legal blindness, as defined in USA: Secondary | ICD-10-CM | POA: Diagnosis not present

## 2021-10-31 DIAGNOSIS — E1139 Type 2 diabetes mellitus with other diabetic ophthalmic complication: Secondary | ICD-10-CM | POA: Diagnosis not present

## 2021-10-31 DIAGNOSIS — E1142 Type 2 diabetes mellitus with diabetic polyneuropathy: Secondary | ICD-10-CM | POA: Diagnosis not present

## 2021-10-31 DIAGNOSIS — I10 Essential (primary) hypertension: Secondary | ICD-10-CM | POA: Diagnosis not present

## 2021-11-02 DIAGNOSIS — M14671 Charcot's joint, right ankle and foot: Secondary | ICD-10-CM | POA: Diagnosis not present

## 2021-11-12 DIAGNOSIS — N1832 Chronic kidney disease, stage 3b: Secondary | ICD-10-CM | POA: Diagnosis not present

## 2021-11-12 DIAGNOSIS — E1122 Type 2 diabetes mellitus with diabetic chronic kidney disease: Secondary | ICD-10-CM | POA: Diagnosis not present

## 2021-11-12 DIAGNOSIS — S51811D Laceration without foreign body of right forearm, subsequent encounter: Secondary | ICD-10-CM | POA: Diagnosis not present

## 2021-11-12 DIAGNOSIS — I4891 Unspecified atrial fibrillation: Secondary | ICD-10-CM | POA: Diagnosis not present

## 2021-11-12 DIAGNOSIS — I700 Atherosclerosis of aorta: Secondary | ICD-10-CM | POA: Diagnosis not present

## 2021-11-12 DIAGNOSIS — Z9981 Dependence on supplemental oxygen: Secondary | ICD-10-CM | POA: Diagnosis not present

## 2021-11-12 DIAGNOSIS — Z794 Long term (current) use of insulin: Secondary | ICD-10-CM | POA: Diagnosis not present

## 2021-11-12 DIAGNOSIS — J449 Chronic obstructive pulmonary disease, unspecified: Secondary | ICD-10-CM | POA: Diagnosis not present

## 2021-11-12 DIAGNOSIS — Z9181 History of falling: Secondary | ICD-10-CM | POA: Diagnosis not present

## 2021-11-13 DIAGNOSIS — G9341 Metabolic encephalopathy: Secondary | ICD-10-CM | POA: Diagnosis not present

## 2021-11-13 DIAGNOSIS — S82892B Other fracture of left lower leg, initial encounter for open fracture type I or II: Secondary | ICD-10-CM | POA: Diagnosis not present

## 2021-11-13 DIAGNOSIS — R531 Weakness: Secondary | ICD-10-CM | POA: Diagnosis not present

## 2021-11-13 DIAGNOSIS — E876 Hypokalemia: Secondary | ICD-10-CM | POA: Diagnosis not present

## 2021-11-14 ENCOUNTER — Emergency Department (HOSPITAL_COMMUNITY): Payer: Medicare Other

## 2021-11-14 ENCOUNTER — Inpatient Hospital Stay (HOSPITAL_COMMUNITY)
Admission: EM | Admit: 2021-11-14 | Discharge: 2021-11-16 | DRG: 193 | Disposition: A | Discharge status: Home / Self Care | Payer: Medicare Other | Attending: Internal Medicine | Admitting: Internal Medicine

## 2021-11-14 ENCOUNTER — Other Ambulatory Visit: Payer: Self-pay

## 2021-11-14 ENCOUNTER — Encounter (HOSPITAL_COMMUNITY): Payer: Self-pay

## 2021-11-14 DIAGNOSIS — Z8249 Family history of ischemic heart disease and other diseases of the circulatory system: Secondary | ICD-10-CM

## 2021-11-14 DIAGNOSIS — I10 Essential (primary) hypertension: Secondary | ICD-10-CM | POA: Diagnosis not present

## 2021-11-14 DIAGNOSIS — Z79891 Long term (current) use of opiate analgesic: Secondary | ICD-10-CM

## 2021-11-14 DIAGNOSIS — A419 Sepsis, unspecified organism: Secondary | ICD-10-CM | POA: Diagnosis not present

## 2021-11-14 DIAGNOSIS — M19011 Primary osteoarthritis, right shoulder: Secondary | ICD-10-CM | POA: Diagnosis not present

## 2021-11-14 DIAGNOSIS — R9431 Abnormal electrocardiogram [ECG] [EKG]: Secondary | ICD-10-CM | POA: Diagnosis not present

## 2021-11-14 DIAGNOSIS — Z794 Long term (current) use of insulin: Secondary | ICD-10-CM | POA: Diagnosis not present

## 2021-11-14 DIAGNOSIS — M19012 Primary osteoarthritis, left shoulder: Secondary | ICD-10-CM | POA: Diagnosis not present

## 2021-11-14 DIAGNOSIS — E539 Vitamin B deficiency, unspecified: Secondary | ICD-10-CM | POA: Diagnosis present

## 2021-11-14 DIAGNOSIS — E876 Hypokalemia: Secondary | ICD-10-CM | POA: Diagnosis present

## 2021-11-14 DIAGNOSIS — J9601 Acute respiratory failure with hypoxia: Secondary | ICD-10-CM | POA: Diagnosis not present

## 2021-11-14 DIAGNOSIS — M545 Low back pain, unspecified: Secondary | ICD-10-CM | POA: Diagnosis not present

## 2021-11-14 DIAGNOSIS — F17210 Nicotine dependence, cigarettes, uncomplicated: Secondary | ICD-10-CM | POA: Diagnosis present

## 2021-11-14 DIAGNOSIS — M160 Bilateral primary osteoarthritis of hip: Secondary | ICD-10-CM | POA: Diagnosis present

## 2021-11-14 DIAGNOSIS — Z981 Arthrodesis status: Secondary | ICD-10-CM

## 2021-11-14 DIAGNOSIS — F419 Anxiety disorder, unspecified: Secondary | ICD-10-CM | POA: Diagnosis present

## 2021-11-14 DIAGNOSIS — T502X5A Adverse effect of carbonic-anhydrase inhibitors, benzothiadiazides and other diuretics, initial encounter: Secondary | ICD-10-CM | POA: Diagnosis present

## 2021-11-14 DIAGNOSIS — J154 Pneumonia due to other streptococci: Principal | ICD-10-CM | POA: Diagnosis present

## 2021-11-14 DIAGNOSIS — Z79899 Other long term (current) drug therapy: Secondary | ICD-10-CM

## 2021-11-14 DIAGNOSIS — G319 Degenerative disease of nervous system, unspecified: Secondary | ICD-10-CM | POA: Diagnosis not present

## 2021-11-14 DIAGNOSIS — K219 Gastro-esophageal reflux disease without esophagitis: Secondary | ICD-10-CM | POA: Diagnosis not present

## 2021-11-14 DIAGNOSIS — Z20822 Contact with and (suspected) exposure to covid-19: Secondary | ICD-10-CM | POA: Diagnosis present

## 2021-11-14 DIAGNOSIS — S199XXA Unspecified injury of neck, initial encounter: Secondary | ICD-10-CM | POA: Diagnosis not present

## 2021-11-14 DIAGNOSIS — J449 Chronic obstructive pulmonary disease, unspecified: Secondary | ICD-10-CM | POA: Diagnosis present

## 2021-11-14 DIAGNOSIS — J189 Pneumonia, unspecified organism: Secondary | ICD-10-CM | POA: Diagnosis present

## 2021-11-14 DIAGNOSIS — H548 Legal blindness, as defined in USA: Secondary | ICD-10-CM | POA: Diagnosis present

## 2021-11-14 DIAGNOSIS — Z833 Family history of diabetes mellitus: Secondary | ICD-10-CM

## 2021-11-14 DIAGNOSIS — Z85828 Personal history of other malignant neoplasm of skin: Secondary | ICD-10-CM

## 2021-11-14 DIAGNOSIS — E7800 Pure hypercholesterolemia, unspecified: Secondary | ICD-10-CM | POA: Diagnosis not present

## 2021-11-14 DIAGNOSIS — I484 Atypical atrial flutter: Secondary | ICD-10-CM | POA: Diagnosis not present

## 2021-11-14 DIAGNOSIS — R6520 Severe sepsis without septic shock: Secondary | ICD-10-CM | POA: Diagnosis not present

## 2021-11-14 DIAGNOSIS — J440 Chronic obstructive pulmonary disease with acute lower respiratory infection: Secondary | ICD-10-CM | POA: Diagnosis present

## 2021-11-14 DIAGNOSIS — R918 Other nonspecific abnormal finding of lung field: Secondary | ICD-10-CM | POA: Diagnosis not present

## 2021-11-14 DIAGNOSIS — G9341 Metabolic encephalopathy: Secondary | ICD-10-CM | POA: Diagnosis present

## 2021-11-14 DIAGNOSIS — I4891 Unspecified atrial fibrillation: Secondary | ICD-10-CM | POA: Diagnosis present

## 2021-11-14 DIAGNOSIS — Z8701 Personal history of pneumonia (recurrent): Secondary | ICD-10-CM

## 2021-11-14 DIAGNOSIS — R296 Repeated falls: Secondary | ICD-10-CM | POA: Diagnosis present

## 2021-11-14 DIAGNOSIS — E663 Overweight: Secondary | ICD-10-CM | POA: Diagnosis present

## 2021-11-14 DIAGNOSIS — F32A Depression, unspecified: Secondary | ICD-10-CM | POA: Diagnosis not present

## 2021-11-14 DIAGNOSIS — E119 Type 2 diabetes mellitus without complications: Secondary | ICD-10-CM | POA: Diagnosis not present

## 2021-11-14 DIAGNOSIS — Z87442 Personal history of urinary calculi: Secondary | ICD-10-CM

## 2021-11-14 DIAGNOSIS — M47819 Spondylosis without myelopathy or radiculopathy, site unspecified: Secondary | ICD-10-CM | POA: Diagnosis present

## 2021-11-14 DIAGNOSIS — Z7951 Long term (current) use of inhaled steroids: Secondary | ICD-10-CM

## 2021-11-14 DIAGNOSIS — E871 Hypo-osmolality and hyponatremia: Secondary | ICD-10-CM | POA: Diagnosis not present

## 2021-11-14 DIAGNOSIS — Z888 Allergy status to other drugs, medicaments and biological substances status: Secondary | ICD-10-CM

## 2021-11-14 DIAGNOSIS — R059 Cough, unspecified: Secondary | ICD-10-CM | POA: Diagnosis not present

## 2021-11-14 DIAGNOSIS — Z7901 Long term (current) use of anticoagulants: Secondary | ICD-10-CM

## 2021-11-14 DIAGNOSIS — Z7984 Long term (current) use of oral hypoglycemic drugs: Secondary | ICD-10-CM

## 2021-11-14 DIAGNOSIS — S0990XA Unspecified injury of head, initial encounter: Secondary | ICD-10-CM | POA: Diagnosis not present

## 2021-11-14 DIAGNOSIS — Z6826 Body mass index (BMI) 26.0-26.9, adult: Secondary | ICD-10-CM

## 2021-11-14 LAB — CBC WITH DIFFERENTIAL/PLATELET
Abs Immature Granulocytes: 0.02 10*3/uL (ref 0.00–0.07)
Basophils Absolute: 0 10*3/uL (ref 0.0–0.1)
Basophils Relative: 0 %
Eosinophils Absolute: 0.1 10*3/uL (ref 0.0–0.5)
Eosinophils Relative: 1 %
HCT: 40.5 % (ref 39.0–52.0)
Hemoglobin: 13.7 g/dL (ref 13.0–17.0)
Immature Granulocytes: 0 %
Lymphocytes Relative: 6 %
Lymphs Abs: 0.4 10*3/uL — ABNORMAL LOW (ref 0.7–4.0)
MCH: 30.2 pg (ref 26.0–34.0)
MCHC: 33.8 g/dL (ref 30.0–36.0)
MCV: 89.4 fL (ref 80.0–100.0)
Monocytes Absolute: 0.4 10*3/uL (ref 0.1–1.0)
Monocytes Relative: 7 %
Neutro Abs: 5.4 10*3/uL (ref 1.7–7.7)
Neutrophils Relative %: 86 %
Platelets: 182 10*3/uL (ref 150–400)
RBC: 4.53 MIL/uL (ref 4.22–5.81)
RDW: 13.5 % (ref 11.5–15.5)
WBC: 6.3 10*3/uL (ref 4.0–10.5)
nRBC: 0 % (ref 0.0–0.2)

## 2021-11-14 LAB — LACTIC ACID, PLASMA
Lactic Acid, Venous: 2 mmol/L (ref 0.5–1.9)
Lactic Acid, Venous: 2.1 mmol/L (ref 0.5–1.9)

## 2021-11-14 LAB — RESP PANEL BY RT-PCR (FLU A&B, COVID) ARPGX2
Influenza A by PCR: NEGATIVE
Influenza B by PCR: NEGATIVE
SARS Coronavirus 2 by RT PCR: NEGATIVE

## 2021-11-14 LAB — COMPREHENSIVE METABOLIC PANEL
ALT: 11 U/L (ref 0–44)
AST: 15 U/L (ref 15–41)
Albumin: 3.1 g/dL — ABNORMAL LOW (ref 3.5–5.0)
Alkaline Phosphatase: 79 U/L (ref 38–126)
Anion gap: 9 (ref 5–15)
BUN: 15 mg/dL (ref 8–23)
CO2: 28 mmol/L (ref 22–32)
Calcium: 8.3 mg/dL — ABNORMAL LOW (ref 8.9–10.3)
Chloride: 94 mmol/L — ABNORMAL LOW (ref 98–111)
Creatinine, Ser: 1.17 mg/dL (ref 0.61–1.24)
GFR, Estimated: >60 mL/min (ref >60)
Glucose, Bld: 137 mg/dL — ABNORMAL HIGH (ref 70–99)
Potassium: 3.5 mmol/L (ref 3.5–5.1)
Sodium: 131 mmol/L — ABNORMAL LOW (ref 135–145)
Total Bilirubin: 1.1 mg/dL (ref 0.3–1.2)
Total Protein: 6 g/dL — ABNORMAL LOW (ref 6.5–8.1)

## 2021-11-14 LAB — APTT: aPTT: 34 seconds (ref 24–36)

## 2021-11-14 LAB — HEMOGLOBIN A1C
Hgb A1c MFr Bld: 6.6 % — ABNORMAL HIGH (ref 4.8–5.6)
Mean Plasma Glucose: 142.72 mg/dL

## 2021-11-14 LAB — URINALYSIS, ROUTINE W REFLEX MICROSCOPIC
Bilirubin Urine: NEGATIVE
Glucose, UA: NEGATIVE mg/dL
Hgb urine dipstick: NEGATIVE
Ketones, ur: NEGATIVE mg/dL
Leukocytes,Ua: NEGATIVE
Nitrite: NEGATIVE
Protein, ur: NEGATIVE mg/dL
Specific Gravity, Urine: 1.013 (ref 1.005–1.030)
pH: 8 (ref 5.0–8.0)

## 2021-11-14 LAB — CBG MONITORING, ED: Glucose-Capillary: 166 mg/dL — ABNORMAL HIGH (ref 70–99)

## 2021-11-14 LAB — GLUCOSE, CAPILLARY
Glucose-Capillary: 183 mg/dL — ABNORMAL HIGH (ref 70–99)
Glucose-Capillary: 119 mg/dL — ABNORMAL HIGH (ref 70–99)

## 2021-11-14 LAB — BRAIN NATRIURETIC PEPTIDE: B Natriuretic Peptide: 222 pg/mL — ABNORMAL HIGH (ref 0.0–100.0)

## 2021-11-14 LAB — PROTIME-INR
INR: 1.2 (ref 0.8–1.2)
Prothrombin Time: 15.2 seconds (ref 11.4–15.2)

## 2021-11-14 LAB — MRSA NEXT GEN BY PCR, NASAL: MRSA by PCR Next Gen: NOT DETECTED

## 2021-11-14 MED ORDER — GUAIFENESIN-DM 100-10 MG/5ML PO SYRP
5.0000 mL | ORAL_SOLUTION | ORAL | Status: DC | PRN
  Administered 2021-11-14: 5 mL via ORAL
  Filled 2021-11-14: qty 5

## 2021-11-14 MED ORDER — ONDANSETRON HCL 4 MG PO TABS: 4.0000 mg | ORAL_TABLET | Freq: Four times a day (QID) | ORAL | Status: DC | PRN

## 2021-11-14 MED ORDER — MOMETASONE FURO-FORMOTEROL FUM 200-5 MCG/ACT IN AERO: 2.0000 | INHALATION_SPRAY | Freq: Two times a day (BID) | RESPIRATORY_TRACT | Status: DC

## 2021-11-14 MED ORDER — LACTATED RINGERS IV BOLUS (SEPSIS)
1000.0000 mL | Freq: Once | INTRAVENOUS | Status: AC
  Administered 2021-11-14: 1000 mL via INTRAVENOUS

## 2021-11-14 MED ORDER — PANTOPRAZOLE SODIUM 40 MG PO TBEC
40.0000 mg | DELAYED_RELEASE_TABLET | Freq: Every day | ORAL | Status: DC
  Administered 2021-11-14 – 2021-11-16 (×3): 40 mg via ORAL
  Filled 2021-11-14 (×3): qty 1

## 2021-11-14 MED ORDER — METRONIDAZOLE 500 MG/100ML IV SOLN
500.0000 mg | Freq: Once | INTRAVENOUS | Status: DC
  Administered 2021-11-14: 500 mg via INTRAVENOUS
  Filled 2021-11-14: qty 100

## 2021-11-14 MED ORDER — ACETAMINOPHEN 650 MG RE SUPP: 650.0000 mg | Freq: Four times a day (QID) | RECTAL | Status: DC | PRN

## 2021-11-14 MED ORDER — SODIUM CHLORIDE 0.9 % IV SOLN
500.0000 mg | Freq: Once | INTRAVENOUS | Status: AC
  Administered 2021-11-14: 500 mg via INTRAVENOUS
  Filled 2021-11-14: qty 5

## 2021-11-14 MED ORDER — METOPROLOL SUCCINATE ER 25 MG PO TB24
25.0000 mg | ORAL_TABLET | Freq: Every day | ORAL | Status: DC
  Administered 2021-11-14 – 2021-11-16 (×3): 25 mg via ORAL
  Filled 2021-11-14 (×3): qty 1

## 2021-11-14 MED ORDER — ONDANSETRON HCL 4 MG/2ML IJ SOLN: 4.0000 mg | Freq: Four times a day (QID) | INTRAMUSCULAR | Status: DC | PRN

## 2021-11-14 MED ORDER — LORATADINE 10 MG PO TABS
10.0000 mg | ORAL_TABLET | Freq: Every day | ORAL | Status: DC
  Administered 2021-11-14 – 2021-11-16 (×3): 10 mg via ORAL
  Filled 2021-11-14 (×3): qty 1

## 2021-11-14 MED ORDER — LATANOPROST 0.005 % OP SOLN
1.0000 [drp] | Freq: Every day | OPHTHALMIC | Status: DC
  Administered 2021-11-14 – 2021-11-15 (×2): 1 [drp] via OPHTHALMIC
  Filled 2021-11-14: qty 2.5

## 2021-11-14 MED ORDER — ACETAMINOPHEN 650 MG RE SUPP
650.0000 mg | Freq: Once | RECTAL | Status: AC
  Administered 2021-11-14: 650 mg via RECTAL
  Filled 2021-11-14: qty 1

## 2021-11-14 MED ORDER — APIXABAN 5 MG PO TABS
5.0000 mg | ORAL_TABLET | Freq: Two times a day (BID) | ORAL | Status: DC
  Administered 2021-11-14 – 2021-11-16 (×5): 5 mg via ORAL
  Filled 2021-11-14 (×5): qty 1

## 2021-11-14 MED ORDER — INSULIN ASPART 100 UNIT/ML IJ SOLN: 0.0000 [IU] | Freq: Every day | INTRAMUSCULAR | Status: DC

## 2021-11-14 MED ORDER — FLUTICASONE FUROATE-VILANTEROL 100-25 MCG/ACT IN AEPB
1.0000 | INHALATION_SPRAY | Freq: Every day | RESPIRATORY_TRACT | Status: DC
  Administered 2021-11-15 – 2021-11-16 (×2): 1 via RESPIRATORY_TRACT
  Filled 2021-11-14: qty 28

## 2021-11-14 MED ORDER — DORZOLAMIDE HCL 2 % OP SOLN
1.0000 [drp] | Freq: Three times a day (TID) | OPHTHALMIC | Status: DC
  Administered 2021-11-14 – 2021-11-16 (×6): 1 [drp] via OPHTHALMIC
  Filled 2021-11-14 (×2): qty 10

## 2021-11-14 MED ORDER — SODIUM CHLORIDE 0.9 % IV SOLN: INTRAVENOUS | Status: AC

## 2021-11-14 MED ORDER — SODIUM CHLORIDE 0.9 % IV SOLN
1.0000 g | INTRAVENOUS | Status: DC
  Administered 2021-11-14 – 2021-11-15 (×2): 1 g via INTRAVENOUS
  Filled 2021-11-14 (×2): qty 10

## 2021-11-14 MED ORDER — SODIUM CHLORIDE 0.9 % IV SOLN: 2.0000 g | Freq: Two times a day (BID) | INTRAVENOUS | Status: DC

## 2021-11-14 MED ORDER — ACETAMINOPHEN 325 MG PO TABS
650.0000 mg | ORAL_TABLET | Freq: Four times a day (QID) | ORAL | Status: DC | PRN
  Administered 2021-11-16: 650 mg via ORAL
  Filled 2021-11-14: qty 2

## 2021-11-14 MED ORDER — LOSARTAN POTASSIUM 50 MG PO TABS
100.0000 mg | ORAL_TABLET | Freq: Every day | ORAL | Status: DC
  Administered 2021-11-14 – 2021-11-16 (×3): 100 mg via ORAL
  Filled 2021-11-14: qty 2
  Filled 2021-11-14: qty 4
  Filled 2021-11-14: qty 2

## 2021-11-14 MED ORDER — LACTATED RINGERS IV SOLN: INTRAVENOUS | Status: DC

## 2021-11-14 MED ORDER — INSULIN ASPART 100 UNIT/ML IJ SOLN
0.0000 [IU] | Freq: Three times a day (TID) | INTRAMUSCULAR | Status: DC
  Administered 2021-11-14: 3 [IU] via SUBCUTANEOUS
  Administered 2021-11-15: 2 [IU] via SUBCUTANEOUS
  Administered 2021-11-15 – 2021-11-16 (×2): 5 [IU] via SUBCUTANEOUS
  Administered 2021-11-16: 2 [IU] via SUBCUTANEOUS

## 2021-11-14 MED ORDER — LACTATED RINGERS IV BOLUS (SEPSIS)
500.0000 mL | Freq: Once | INTRAVENOUS | Status: AC
  Administered 2021-11-14: 500 mL via INTRAVENOUS

## 2021-11-14 MED ORDER — VANCOMYCIN HCL IN DEXTROSE 1-5 GM/200ML-% IV SOLN
1000.0000 mg | Freq: Once | INTRAVENOUS | Status: AC
  Administered 2021-11-14: 1000 mg via INTRAVENOUS
  Filled 2021-11-14: qty 200

## 2021-11-14 MED ORDER — VANCOMYCIN HCL 1250 MG/250ML IV SOLN: 1250.0000 mg | INTRAVENOUS | Status: DC

## 2021-11-14 MED ORDER — ROSUVASTATIN CALCIUM 10 MG PO TABS
5.0000 mg | ORAL_TABLET | ORAL | Status: DC
  Administered 2021-11-14: 5 mg via ORAL
  Filled 2021-11-14: qty 1

## 2021-11-14 MED ORDER — FLUTICASONE PROPIONATE 50 MCG/ACT NA SUSP
2.0000 | Freq: Every day | NASAL | Status: DC
  Administered 2021-11-15 – 2021-11-16 (×2): 2 via NASAL
  Filled 2021-11-14 (×2): qty 16

## 2021-11-14 MED ORDER — SODIUM CHLORIDE 0.9 % IV SOLN
2.0000 g | Freq: Once | INTRAVENOUS | Status: DC
  Administered 2021-11-14: 2 g via INTRAVENOUS
  Filled 2021-11-14: qty 12.5

## 2021-11-14 MED ORDER — VANCOMYCIN HCL IN DEXTROSE 1-5 GM/200ML-% IV SOLN
1000.0000 mg | Freq: Once | INTRAVENOUS | Status: AC
Start: 2021-11-14 — End: 2021-11-14
  Administered 2021-11-14: 1000 mg via INTRAVENOUS
  Filled 2021-11-14: qty 200

## 2021-11-14 MED ORDER — IPRATROPIUM-ALBUTEROL 0.5-2.5 (3) MG/3ML IN SOLN: 3.0000 mL | Freq: Four times a day (QID) | RESPIRATORY_TRACT | Status: DC | PRN

## 2021-11-14 MED ORDER — SODIUM CHLORIDE 0.9 % IV SOLN
500.0000 mg | INTRAVENOUS | Status: DC
  Administered 2021-11-15 – 2021-11-16 (×2): 500 mg via INTRAVENOUS
  Filled 2021-11-14 (×2): qty 5

## 2021-11-14 MED ORDER — ALBUTEROL SULFATE (2.5 MG/3ML) 0.083% IN NEBU: 3.0000 mL | INHALATION_SOLUTION | Freq: Four times a day (QID) | RESPIRATORY_TRACT | Status: DC | PRN

## 2021-11-14 NOTE — H&P (Addendum)
History and Physical    Jason Woods BHA:[id number] DOB: Aug 17, 1945 DOA: 11/14/2021  PCP: Glenda Chroman, MD   Patient coming from: Home  Chief Complaint: Confusion/cough  HPI: Jason Woods is a 76 y.o. male with medical history significant for anxiety/depression, recurrent falls, chronic opiate use, dyslipidemia, GERD, hypertension, type 2 diabetes, atrial flutter on Eliquis, and COPD with ongoing tobacco abuse who was brought to the ED today via EMS on account of worsening confusion as well as ongoing cough.  Unfortunately no further significant history could be obtained as there are no family members at bedside and they cannot be reached.  He was apparently treated for pneumonia about 3 months ago and was noted to have recurrent falls about 2 weeks ago.  He was recently started on Eliquis for anticoagulation given noted documentation of atrial flutter.   ED Course: Vital signs with temperature 103.6 F.  No leukocytosis and lactic acid of 2.0.  He was noted to have an initial oxygen saturation of 85% requiring 4 L nasal cannula initiation.  He was also started on multiple antibiotics with chest x-ray demonstrating right-sided pneumonia.  CT head and neck were obtained on account of recent falls and were negative for any trauma.  Foley catheter placed to monitor temperature.  Review of Systems: Could not be obtained given patient condition.  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    Depression    GERD (gastroesophageal reflux disease)    High cholesterol    History of kidney stones    Hypertension    Legally blind    Skin cancer    "frozen off my face, arms" (01/07/2018)   Type II diabetes mellitus ([place])    Vitamin B deficiency  03/09/2017    Past Surgical History:  Procedure Laterality Date   ANKLE FUSION Left 01/12/2018   Procedure: LEFT ANKLE REMOVAL OF EXTERNAL FIXATOR, ANKLE FUSION WITH PHOENIX NAIL;  Surgeon: [name], MD;  Location: [place];  Service: Orthopedics;  Laterality: Left;   ANKLE HARDWARE REMOVAL Left 01/07/2018   REMOVAL OF HARDWARE, TAKE DOWN OF NONUNION, INSERTION OF ANTIBIOTC BEADS/notes 01/07/2018   BIOPSY  04/23/2017   Procedure: BIOPSY;  Surgeon: Rogene Houston, MD;  [facility];  Service: Endoscopy;;  gastric   CATARACT EXTRACTION W/ INTRAOCULAR LENS  IMPLANT, BILATERAL Bilateral    ESOPHAGEAL DILATION N/A 04/23/2017   Procedure: ESOPHAGEAL DILATION;  Surgeon: Rogene Houston, MD;  [facility];  Service: Endoscopy;  Laterality: N/A;   ESOPHAGOGASTRODUODENOSCOPY N/A 04/23/2017   Procedure: ESOPHAGOGASTRODUODENOSCOPY (EGD);  Surgeon: Rogene Houston, MD;  [facility];  Service: Endoscopy;  Laterality: N/A;  9:55-moved to 1040 per Ann   EXTERNAL FIXATION LEG Left 01/07/2018   EXTERNAL FIXATION LEG, TIBIA TO CALCANEUS/notes 01/07/2018   EXTERNAL FIXATION LEG Left 01/07/2018   Procedure: EXTERNAL FIXATION LEG, TIBIA TO CALCANEUS;  Surgeon: [name], MD;  Location: [place];  Service: Orthopedics;  Laterality: Left;   EXTERNAL FIXATION REMOVAL Left 01/12/2018   Procedure: REMOVAL EXTERNAL FIXATION ANKLE;  Surgeon: [name], MD;  Location: [place];  Service: Orthopedics;  Laterality: Left;   FRACTURE SURGERY     HARDWARE  REMOVAL Left 01/07/2018   Procedure: LEFT ANKLE REMOVAL OF HARDWARE, TAKE DOWN OF NONUNION, INSERTION OF ANTIBIOTC BEADS;  Surgeon: [name], MD;  Location: [place];  Service: Orthopedics;  Laterality: Left;   HARDWARE REMOVAL Left 12/01/2018   Procedure: Left calcaneus, talus, and tibia removal of deep implants; irrigation and excisional debridement;  Surgeon: Wylene Simmer, MD;  Location: [place];  Service: Orthopedics;  Laterality:  Left;   I & D EXTREMITY Left 10/05/2017   Procedure: IRRIGATION AND DEBRIDEMENT QPEN ANKLE FRACTURE;  Surgeon: [name], MD;  Location: [place];  Service: Orthopedics;  Laterality: Left;   INNER EAR SURGERY Right 1962   "scraped the bone; couldn't hear out of it; was infected"   MULTIPLE TOOTH EXTRACTIONS  2018   ORIF ANKLE FRACTURE Left 10/05/2017   Procedure: OPEN REDUCTION INTERNAL FIXATION TRIMALLEOLAR (ORIF)ANKLE FRACTURE;  Surgeon: [name], MD;  Location: [place];  Service: Orthopedics;  Laterality: Left;   WRIST FRACTURE SURGERY Left 2018     reports that he has been smoking cigarettes. He has a 62.00 pack-year smoking history. He has been exposed to tobacco smoke. He has never used smokeless tobacco. He reports that he does not currently use alcohol. He reports that he does not currently use drugs.  Allergies  Allergen Reactions   Lipitor [Atorvastatin] Other (See Comments)    Aching, MS   Zocor [Simvastatin-High Dose] Other (See Comments)    MS aches   Celexa [Citalopram Hydrobromide] Other (See Comments)    Nausea,blurred vision   Crestor [Rosuvastatin]     Pain, aching muscles   Mobic [Meloxicam] Other (See Comments)    nausea   Prozac [Fluoxetine Hcl] Other (See Comments)    fatigue    Family History  Problem Relation Age of Onset   Diabetes Mother    Heart attack Father    Diabetes Sister    Diabetes Brother     Prior to Admission medications   Medication Sig Start Date End Date Taking? Authorizing Provider  albuterol (VENTOLIN HFA) 108 (90 Base) MCG/ACT inhaler Inhale 1-2 puffs into the lungs every 6 (six) hours as needed for wheezing or shortness of breath. 07/15/21  Yes Smoot, Sarah A, PA-C  ALPRAZolam (XANAX) 1 MG tablet Take 0.5 tablets (0.5 mg total) by mouth 3 (three) times daily. 09/15/20  Yes Johnson, Clanford L, MD  apixaban (ELIQUIS) 5 MG TABS tablet Take 1 tablet (5 mg total) by mouth 2 (two) times daily. 10/16/21  Yes Fenton, Clint R, PA  b  complex vitamins tablet Take 1 tablet by mouth daily. With C   Yes [provider]  bimatoprost (LUMIGAN) 0.01 % SOLN Place 1 drop into the right eye at bedtime.   Yes [provider]  BREO ELLIPTA 100-25 MCG/ACT AEPB Inhale 1 puff into the lungs daily. 10/31/21  Yes [provider]  buprenorphine (SUBUTEX) 2 MG SUBL SL tablet Place 2 mg under the tongue daily. 09/12/21  Yes [provider]  cetirizine (ZYRTEC) 10 MG tablet Take 10 mg by mouth daily. 09/06/21  Yes [provider]  Cholecalciferol 50 MCG (2000 UT) TABS Take 2,000 Units by mouth daily.   Yes [provider]  cyanocobalamin 2000 MCG tablet Take 2,000 mcg by mouth daily.   Yes [provider]  docusate sodium (COLACE) 100 MG capsule Take 1 capsule (100 mg total) by mouth 2 (two) times daily. While taking narcotic pain medicine. 12/06/18  Yes Corky Sing, PA-C  dorzolamide (TRUSOPT) 2 % ophthalmic solution Place 1 drop into both eyes 3 (three) times daily.  04/15/17  Yes [provider]  DULoxetine (CYMBALTA) 30 MG capsule Take 30 mg by mouth daily.  10/11/17  Yes [provider]  fluticasone (FLONASE) 50 MCG/ACT nasal spray Place 2 sprays into both nostrils daily.    Yes [provider]  gabapentin (NEURONTIN) 400 MG capsule Take 400 mg by mouth 2 (two) times daily. 09/06/21  Yes [provider]  HUMALOG KWIKPEN 100 UNIT/ML KwikPen Inject 5 Units into the skin in the morning, at noon, and at bedtime. 09/06/21  Yes [provider]  hydrochlorothiazide (HYDRODIURIL) 25 MG tablet Take 25 mg by mouth daily. 09/06/21  Yes [provider]  hydrOXYzine (ATARAX) 25 MG tablet Take 25 mg by mouth at bedtime. 09/06/21  Yes [provider]  insulin glargine (LANTUS) 100 UNIT/ML injection Inject 0.1-0.18 mLs (10-18 Units total) into the skin See admin instructions. Per sliding  16 units in the morning and 8-10 units in the  evening 09/15/20  Yes Johnson, Clanford L, MD  ipratropium-albuterol (DUONEB) 0.5-2.5 (3) MG/3ML SOLN Take 3 mLs by nebulization every 6 (six) hours as needed for shortness of breath. 09/03/21  Yes [provider]  ketoconazole (NIZORAL) 2 % cream Apply 1 application topically daily.   Yes [provider]  losartan (COZAAR) 100 MG tablet Take 100 mg by mouth daily. 09/06/21  Yes [provider]  metFORMIN (GLUCOPHAGE) 850 MG tablet Take 850 mg by mouth 2 (two) times daily with a meal.   Yes [provider]  metoprolol succinate (TOPROL-XL) 25 MG 24 hr tablet Take 25 mg by mouth daily. 06/28/18  Yes [provider]  Multiple Vitamin (MULTIVITAMIN WITH MINERALS) TABS tablet Take 1 tablet by mouth daily.   Yes [provider]  oxyCODONE-acetaminophen (PERCOCET) 10-325 MG tablet Take 1 tablet by mouth 2 (two) times daily as needed for pain. 08/16/21  Yes [provider]  pantoprazole (PROTONIX) 40 MG tablet Take 40 mg by mouth daily.   Yes [provider]  rosuvastatin (CRESTOR) 5 MG tablet Take 5 mg by mouth once a week. 09/06/21  Yes [provider]  SYMBICORT 160-4.5 MCG/ACT inhaler Inhale 2 puffs into the lungs in the morning and at bedtime. 07/14/21  Yes [provider]  traZODone (DESYREL) 100 MG tablet Take 200 mg by mouth at bedtime as needed for sleep. 09/08/21  Yes [provider]  triamcinolone cream (KENALOG) 0.1 % Apply 1 application topically daily as needed (affected area(s) on skin).  04/15/17  Yes [provider]  TRULANCE 3 MG TABS Take 1 tablet by mouth daily. 08/25/21  Yes [provider]  Continuous Blood Gluc Receiver (FREESTYLE LIBRE 2 READER) DEVI daily. 09/30/21   [provider]  Continuous Blood Gluc Sensor (FREESTYLE LIBRE 2 SENSOR) MISC SMARTSIG:Via Meter Every 10 Days 09/29/21   [provider]    Physical Exam: Vitals:   11/14/21 0952 11/14/21 1100  11/14/21 1200 11/14/21 1215  BP: 111/72 (!) 128/56 (!) 106/46   Pulse: 82 89 82 84  Resp: (!) '26 16 19 15  '$ Temp: (!) 103.6 F (39.8 C)   99.7 F (37.6 C)  TempSrc: Rectal     SpO2: 94% 96% 96% 98%  Weight: 77.1 kg     Height: '5\' 7"'$  (1.702 m)       Constitutional: NAD, calm, comfortable, confused Vitals:   11/14/21 0952 11/14/21 1100 11/14/21 1200  11/14/21 1215  BP: 111/72 (!) 128/56 (!) 106/46   Pulse: 82 89 82 84  Resp: (!) '26 16 19 15  '$ Temp: (!) 103.6 F (39.8 C)   99.7 F (37.6 C)  TempSrc: Rectal     SpO2: 94% 96% 96% 98%  Weight: 77.1 kg     Height: '5\' 7"'$  (1.702 m)      Eyes: lids and conjunctivae normal Neck: normal, supple Respiratory: clear to auscultation bilaterally. Normal respiratory effort. No accessory muscle use.  Currently on 4 L nasal cannula Cardiovascular: Regular rate and rhythm, no murmurs. Abdomen: no tenderness, no distention. Bowel sounds positive.  Musculoskeletal:  No edema. Skin: no rashes, lesions, ulcers.  Psychiatric: Flat affect  Labs on Admission: I have personally reviewed following labs and imaging studies  CBC: Recent Labs  Lab 11/14/21 1008  WBC 6.3  NEUTROABS 5.4  HGB 13.7  HCT 40.5  MCV 89.4  PLT 616   Basic Metabolic Panel: Recent Labs  Lab 11/14/21 1008  NA 131*  K 3.5  CL 94*  CO2 28  GLUCOSE 137*  BUN 15  CREATININE 1.17  CALCIUM 8.3*   GFR: Estimated Creatinine Clearance: 50.2 mL/min (by C-G formula based on SCr of 1.17 mg/dL). Liver Function Tests: Recent Labs  Lab 11/14/21 1008  AST 15  ALT 11  ALKPHOS 79  BILITOT 1.1  PROT 6.0*  ALBUMIN 3.1*   No results for input(s): "LIPASE", "AMYLASE" in the last 168 hours. No results for input(s): "AMMONIA" in the last 168 hours. Coagulation Profile: Recent Labs  Lab 11/14/21 1008  INR 1.2   Cardiac Enzymes: No results for input(s): "CKTOTAL", "CKMB", "CKMBINDEX", "TROPONINI" in the last 168 hours. BNP (last 3 results) No results for input(s):  "PROBNP" in the last 8760 hours. HbA1C: No results for input(s): "HGBA1C" in the last 72 hours. CBG: No results for input(s): "GLUCAP" in the last 168 hours. Lipid Profile: No results for input(s): "CHOL", "HDL", "LDLCALC", "TRIG", "CHOLHDL", "LDLDIRECT" in the last 72 hours. Thyroid Function Tests: No results for input(s): "TSH", "T4TOTAL", "FREET4", "T3FREE", "THYROIDAB" in the last 72 hours. Anemia Panel: No results for input(s): "VITAMINB12", "FOLATE", "FERRITIN", "TIBC", "IRON", "RETICCTPCT" in the last 72 hours. Urine analysis:    Component Value Date/Time   COLORURINE YELLOW 11/14/2021 [place] 11/14/2021 1216   LABSPEC 1.013 11/14/2021 1216   PHURINE 8.0 11/14/2021 1216   GLUCOSEU NEGATIVE 11/14/2021 1216   HGBUR NEGATIVE 11/14/2021 1216   BILIRUBINUR NEGATIVE 11/14/2021 1216   KETONESUR NEGATIVE 11/14/2021 1216   PROTEINUR NEGATIVE 11/14/2021 1216   NITRITE NEGATIVE 11/14/2021 1216   LEUKOCYTESUR NEGATIVE 11/14/2021 1216    Radiological Exams on Admission: CT Head Wo Contrast  Result Date: 11/14/2021 CLINICAL DATA:  Neck trauma EXAM: CT HEAD WITHOUT CONTRAST CT CERVICAL SPINE WITHOUT CONTRAST TECHNIQUE: Multidetector CT imaging of the head and cervical spine was performed following the standard protocol without intravenous contrast. Multiplanar CT image reconstructions of the cervical spine were also generated. RADIATION DOSE REDUCTION: This exam was performed according to the departmental dose-optimization program which includes automated exposure control, adjustment of the mA and/or kV according to patient size and/or use of iterative reconstruction technique. COMPARISON:  CT head and cervical spine dated September 13, 2021 FINDINGS: CT HEAD FINDINGS Brain: Mild generalized atrophy. No evidence of acute infarction, hemorrhage, hydrocephalus, extra-axial collection or mass lesion/mass effect. Vascular: No hyperdense vessel or unexpected calcification. Skull:  Normal. Negative for fracture or focal lesion. Sinuses/Orbits: No acute finding. Other:  None. CT CERVICAL SPINE FINDINGS Alignment: Normal. Skull base and vertebrae: No acute fracture. No primary bone lesion or focal pathologic process. Soft tissues and spinal canal: No prevertebral fluid or swelling. No visible canal hematoma. Disc levels: Moderate multilevel degenerative disc disease, unchanged when compared with prior exam. Upper chest: Mild linear opacities of the right lung apex, likely due to scarring or atelectasis. Other: None. IMPRESSION: 1. No acute intracranial abnormality. 2. No evidence of acute cervical spine fracture or traumatic malalignment. Electronically Signed   By: Yetta Glassman M.D.   On: 11/14/2021 11:41   CT Cervical Spine Wo Contrast  Result Date: 11/14/2021 CLINICAL DATA:  Neck trauma EXAM: CT HEAD WITHOUT CONTRAST CT CERVICAL SPINE WITHOUT CONTRAST TECHNIQUE: Multidetector CT imaging of the head and cervical spine was performed following the standard protocol without intravenous contrast. Multiplanar CT image reconstructions of the cervical spine were also generated. RADIATION DOSE REDUCTION: This exam was performed according to the departmental dose-optimization program which includes automated exposure control, adjustment of the mA and/or kV according to patient size and/or use of iterative reconstruction technique. COMPARISON:  CT head and cervical spine dated September 13, 2021 FINDINGS: CT HEAD FINDINGS Brain: Mild generalized atrophy. No evidence of acute infarction, hemorrhage, hydrocephalus, extra-axial collection or mass lesion/mass effect. Vascular: No hyperdense vessel or unexpected calcification. Skull: Normal. Negative for fracture or focal lesion. Sinuses/Orbits: No acute finding. Other: None. CT CERVICAL SPINE FINDINGS Alignment: Normal. Skull base and vertebrae: No acute fracture. No primary bone lesion or focal pathologic process. Soft tissues and spinal canal: No  prevertebral fluid or swelling. No visible canal hematoma. Disc levels: Moderate multilevel degenerative disc disease, unchanged when compared with prior exam. Upper chest: Mild linear opacities of the right lung apex, likely due to scarring or atelectasis. Other: None. IMPRESSION: 1. No acute intracranial abnormality. 2. No evidence of acute cervical spine fracture or traumatic malalignment. Electronically Signed   By: Yetta Glassman M.D.   On: 11/14/2021 11:41   DG Chest Port 1 View  Result Date: 11/14/2021 CLINICAL DATA:  Questionable sepsis - evaluate for abnormality. Altered mental status. Cough. EXAM: PORTABLE CHEST 1 VIEW COMPARISON:  Chest radiographs 09/13/2021 FINDINGS: The patient is mildly rotated to the right with unchanged cardiomediastinal silhouette. There are new patchy airspace opacities throughout the right lung, greatest in the mid upper lung. The left lung is clear. No sizable pleural effusion or pneumothorax is identified. No acute osseous abnormality is seen. IMPRESSION: New right lung opacities concerning for pneumonia. Electronically Signed   By: Logan Bores M.D.   On: 11/14/2021 10:15    EKG: Independently reviewed. SR 82bpm.  Assessment/Plan Principal Problem:   Acute hypoxemic respiratory failure (HCC) Active Problems:   High cholesterol   Hypertension   Gastroesophageal reflux disease   PNA (pneumonia)   COPD  GOLD ? / still smoking    Cigarette smoker   Atypical atrial flutter (HCC)    Acute hypoxemic respiratory failure secondary to community-acquired pneumonia New right lung opacities noted on imaging Follow-up blood cultures Strep pneumonia and Legionella antigen Continue on Rocephin and azithromycin Antitussives  Acute metabolic encephalopathy secondary to above vs polypharmacy Continue to monitor carefully while being treated Hold any narcotics and medications for anxiety/depression for now CT head and neck with no acute findings  Mild  hyponatremia Likely related to home HCTZ use Discontinue HCTZ for now  History of COPD with ongoing tobacco abuse DuoNebs as needed with no acute bronchospasms noted Likely will require home oxygen  on discharge Counseled on cessation of tobacco abuse  History of atrial flutter CHA2DS2-VASc score of 4 for which patient is on Eliquis Toprol 25 mg daily Telemetry monitoring May need to consider discontinuation of Eliquis if recurrent falls are noted and patient not being discharged to a safe environment  Type 2 diabetes Continue SSI and carb modified diet  Dyslipidemia Continue statin  Hypertension Continue home medications with the exception of HCTZ as noted above  Anxiety/depression Hold home medications until in the follow-up with improves  GERD PPI  Chronic opiate use Hold opiates until confusion improves  Debility with recurrent falls likely related to polypharmacy Fall precautions PT/OT evaluation  Overweight BMI 26.63   DVT prophylaxis: Eliquis Code Status: Full Family Communication: Tried calling sister with no response; none at bedside Disposition Plan:Admit for pneumonia treatment Consults called:None Admission status: Obs, Tele  Severity of Illness: The appropriate patient status for this patient is OBSERVATION. Observation status is judged to be reasonable and necessary in order to provide the required intensity of service to ensure the patient's safety. The patient's presenting symptoms, physical exam findings, and initial radiographic and laboratory data in the context of their medical condition is felt to place them at decreased risk for further clinical deterioration. Furthermore, it is anticipated that the patient will be medically stable for discharge from the hospital within 2 midnights of admission.    Jason Jipson D Manuella Ghazi DO [facility]  If 7PM-7AM, please contact night-coverage [email or web address]  11/14/2021, 1:18 PM

## 2021-11-14 NOTE — ED Notes (Signed)
Per DO, Pratik Shah, pt's temperature does not have to be monitored via temperature foley--states that monitoring temperature rectally as needed is appropriate.

## 2021-11-14 NOTE — ED Provider Notes (Signed)
[facility] EMERGENCY DEPARTMENT Provider Note   CSN: [id number] Arrival date & time: 11/14/21  4098     History Chief Complaint  Patient presents with   Altered Mental Status    Jason Woods is a 76 y.o. male with h/o COPD, DM presents to the ED for evaluation of Altered mental status.  Patient lives at home with a another male resident and questionably has a day nurse?[name]  His sister went to go visit him today when she noticed that he was acting differently.  She called EMS.  EMS reports that he was febrile in the 103 Fahrenheit, hypotensive, and in normal sinus rhythm despite his history of A-fib.  Per EMS, patient did have of questionable fall, patient is on blood thinners.  Upon discussion with the patient, he denies any chest pain or shortness of breath.  Denies any abdominal pain, nausea, vomiting.  Denies any head, neck, or back pain.  Denies any extremity pain.  He is alert and oriented to person and place however is only oriented to the year and not the month.   EMS reports that they gave 258ml of fluids and 1 g of Rocephin.  They report that on arrival he was 85% on room air and they gave him 4 L and went to 96%.    Altered Mental Status      Home Medications Prior to Admission medications   Medication Sig Start Date End Date Taking? Authorizing Provider  albuterol (VENTOLIN HFA) 108 (90 Base) MCG/ACT inhaler Inhale 1-2 puffs into the lungs every 6 (six) hours as needed for wheezing or shortness of breath. 07/15/21   Smoot, Sarah A, PA-C  ALPRAZolam Duanne Moron) 1 MG tablet Take 0.5 tablets (0.5 mg total) by mouth 3 (three) times daily. 09/15/20   Johnson, Clanford L, MD  apixaban (ELIQUIS) 5 MG TABS tablet Take 1 tablet (5 mg total) by mouth 2 (two) times daily. 10/16/21   Fenton, Clint R, PA  b complex vitamins tablet Take 1 tablet by mouth daily. With C    [provider]  bimatoprost (LUMIGAN) 0.01 % SOLN Place 1 drop into the right eye at bedtime.    [provider]  buprenorphine (SUBUTEX) 2 MG SUBL SL tablet Place 2 mg under the tongue daily. 09/12/21   [provider]  cetirizine (ZYRTEC) 10 MG tablet Take 10 mg by mouth daily. 09/06/21   [provider]  Cholecalciferol 50 MCG (2000 UT) TABS Take 2,000 Units by mouth daily.    [provider]  Continuous Blood Gluc Receiver (FREESTYLE LIBRE 2 READER) DEVI daily. 09/30/21   [provider]  Continuous Blood Gluc Sensor (FREESTYLE LIBRE 2 SENSOR) MISC SMARTSIG:Via Meter Every 10 Days 09/29/21   [provider]  cyanocobalamin 2000 MCG tablet Take 2,000 mcg by mouth daily.    [provider]  docusate sodium (COLACE) 100 MG capsule Take 1 capsule (100 mg total) by mouth 2 (two) times daily. While taking narcotic pain medicine. 12/06/18   Corky Sing, PA-C  dorzolamide (TRUSOPT) 2 % ophthalmic solution Place 1 drop into both eyes 3 (three) times daily.  04/15/17   [provider]  DULoxetine (CYMBALTA) 30 MG capsule Take 30 mg by mouth daily.  10/11/17   [provider]  fluticasone (FLONASE) 50 MCG/ACT nasal spray Place 2 sprays into both nostrils daily.     [provider]  gabapentin (NEURONTIN) 400 MG capsule Take 400 mg by mouth 2 (two) times  daily. 09/06/21   [provider]  HUMALOG KWIKPEN 100 UNIT/ML KwikPen Inject 5 Units into the skin in the morning, at noon, and at bedtime. 09/06/21   [provider]  hydrochlorothiazide (HYDRODIURIL) 25 MG tablet Take 25 mg by mouth daily. 09/06/21   [provider]  hydrOXYzine (ATARAX) 25 MG tablet Take 25 mg by mouth at bedtime. 09/06/21   [provider]  insulin glargine (LANTUS) 100 UNIT/ML injection Inject 0.1-0.18 mLs (10-18 Units total) into the skin See admin instructions. Per sliding  16 units in the morning and 8-10 units in the evening 09/15/20   Johnson, Clanford L, MD  ipratropium-albuterol (DUONEB) 0.5-2.5 (3) MG/3ML SOLN  Take 3 mLs by nebulization every 6 (six) hours as needed for shortness of breath. 09/03/21   [provider]  ketoconazole (NIZORAL) 2 % cream Apply 1 application topically daily.    [provider]  losartan (COZAAR) 100 MG tablet Take 100 mg by mouth daily. 09/06/21   [provider]  metFORMIN (GLUCOPHAGE) 850 MG tablet Take 850 mg by mouth 2 (two) times daily with a meal.    [provider]  metoprolol succinate (TOPROL-XL) 25 MG 24 hr tablet Take 25 mg by mouth daily. 06/28/18   [provider]  Multiple Vitamin (MULTIVITAMIN WITH MINERALS) TABS tablet Take 1 tablet by mouth daily.    [provider]  oxyCODONE-acetaminophen (PERCOCET) 10-325 MG tablet Take 1 tablet by mouth 2 (two) times daily as needed for pain. 08/16/21   [provider]  pantoprazole (PROTONIX) 40 MG tablet Take 40 mg by mouth daily.    [provider]  rosuvastatin (CRESTOR) 5 MG tablet Take 5 mg by mouth once a week. 09/06/21   [provider]  SYMBICORT 160-4.5 MCG/ACT inhaler Inhale 2 puffs into the lungs in the morning and at bedtime. 07/14/21   [provider]  traZODone (DESYREL) 100 MG tablet Take 200 mg by mouth at bedtime as needed for sleep. 09/08/21   [provider]  triamcinolone cream (KENALOG) 0.1 % Apply 1 application topically daily as needed (affected area(s) on skin).  04/15/17   [provider]  TRULANCE 3 MG TABS Take 1 tablet by mouth daily. 08/25/21   [provider]      Allergies    Lipitor [atorvastatin], Zocor [simvastatin-high dose], Celexa [citalopram hydrobromide], Crestor [rosuvastatin], Mobic [meloxicam], and Prozac [fluoxetine hcl]    Review of Systems   Review of Systems  Unable to perform ROS: Mental status change    Physical Exam Updated Vital Signs Ht $Remove'5\' 7"'RrnNJDh$  (1.702 m)   Wt 77.1 kg   BMI 26.63 kg/m  Physical Exam Constitutional:      Appearance: He is not  toxic-appearing.     Comments: Chronically ill-appearing.  Alert.  Not toxic appearing  HENT:     Head: Normocephalic and atraumatic.     Comments: No step-offs or deformities.  No signs of trauma.  Negative battle signs or raccoon eyes.    Mouth/Throat:     Mouth: Mucous membranes are dry.     Comments: Significantly dry mucous membranes Eyes:     Pupils: Pupils are equal, round, and reactive to light.  Neck:     Comments: No step-offs or deformities.  No signs of trauma. Cardiovascular:     Rate and Rhythm: Normal rate and regular rhythm.  Pulmonary:     Effort: Pulmonary effort is normal. No respiratory distress.  Abdominal:  Tenderness: There is no abdominal tenderness. There is no guarding or rebound.  Musculoskeletal:     Cervical back: No tenderness.     Comments: No obvious signs of trauma to the extremities.  Patient moving all 4 extremities spontaneously without pain. Faint surgical scars noted to the left ankle. Palpable pulses bilaterally.   Neurological:     General: No focal deficit present.     Mental Status: He is alert.     Comments: Alert.  Patient oriented to person and place and year however not month.  Pleasant.  No focal deficit noted.     ED Results / Procedures / Treatments   Labs (all labs ordered are listed, but only abnormal results are displayed) Labs Reviewed  LACTIC ACID, PLASMA - Abnormal; Notable for the following components:      Result Value   Lactic Acid, Venous 2.0 (*)    All other components within normal limits  LACTIC ACID, PLASMA - Abnormal; Notable for the following components:   Lactic Acid, Venous 2.1 (*)    All other components within normal limits  COMPREHENSIVE METABOLIC PANEL - Abnormal; Notable for the following components:   Sodium 131 (*)    Chloride 94 (*)    Glucose, Bld 137 (*)    Calcium 8.3 (*)    Total Protein 6.0 (*)    Albumin 3.1 (*)    All other components within normal limits  CBC WITH DIFFERENTIAL/PLATELET  - Abnormal; Notable for the following components:   Lymphs Abs 0.4 (*)    All other components within normal limits  BRAIN NATRIURETIC PEPTIDE - Abnormal; Notable for the following components:   B Natriuretic Peptide 222.0 (*)    All other components within normal limits  HEMOGLOBIN A1C - Abnormal; Notable for the following components:   Hgb A1c MFr Bld 6.6 (*)    All other components within normal limits  GLUCOSE, CAPILLARY - Abnormal; Notable for the following components:   Glucose-Capillary 183 (*)    All other components within normal limits  CBG MONITORING, ED - Abnormal; Notable for the following components:   Glucose-Capillary 166 (*)    All other components within normal limits  RESP PANEL BY RT-PCR (FLU A&B, COVID) ARPGX2  CULTURE, BLOOD (ROUTINE X 2)  CULTURE, BLOOD (ROUTINE X 2)  MRSA NEXT GEN BY PCR, NASAL  URINE CULTURE  RESP PANEL BY RT-PCR (FLU A&B, COVID) ARPGX2  PROTIME-INR  APTT  URINALYSIS, ROUTINE W REFLEX MICROSCOPIC  STREP PNEUMONIAE URINARY ANTIGEN  LEGIONELLA PNEUMOPHILA SEROGP 1 UR AG  MAGNESIUM  BASIC METABOLIC PANEL  CBC    EKG None  Radiology No results found.  Procedures .Critical Care  Performed by: Sherrell Puller, PA-C Authorized by: Sherrell Puller, PA-C   Critical care provider statement:    Critical care time (minutes):  30   Critical care was necessary to treat or prevent imminent or life-threatening deterioration of the following conditions:  Sepsis   Critical care was time spent personally by me on the following activities:  Development of treatment plan with patient or surrogate, discussions with consultants, evaluation of patient's response to treatment, examination of patient, ordering and review of laboratory studies, ordering and review of radiographic studies, ordering and performing treatments and interventions, pulse oximetry, re-evaluation of patient's condition, review of old charts and obtaining history from patient or  surrogate   Care discussed with: admitting provider      Medications Ordered in ED Medications  lactated ringers infusion (has no administration  in time range)  lactated ringers bolus 1,000 mL (has no administration in time range)    And  lactated ringers bolus 1,000 mL (has no administration in time range)    And  lactated ringers bolus 500 mL (has no administration in time range)  ceFEPIme (MAXIPIME) 2 g in sodium chloride 0.9 % 100 mL IVPB (has no administration in time range)  metroNIDAZOLE (FLAGYL) IVPB 500 mg (has no administration in time range)  vancomycin (VANCOCIN) IVPB 1000 mg/200 mL premix (has no administration in time range)  acetaminophen (TYLENOL) suppository 650 mg (has no administration in time range)    ED Course/ Medical Decision Making/ A&P                           Medical Decision Making Amount and/or Complexity of Data Reviewed Labs: ordered. Radiology: ordered. ECG/medicine tests: ordered.  Risk OTC drugs. Prescription drug management. Decision regarding hospitalization.   76 year old male presents the emergency room for evaluation of altered mental status.  On initial presentation, patient was febrile, hypotensive, met sepsis criteria.  Vital signs initially showed fever at 103.6.  Blood pressure was 111/72 all however was in the 37T systolically with EMS.  New oxygen requirement.  Was 85% on room air when EMS arrived and is now 94% on 4 L.  No increased work of breath.  Physical exam as noted above.  PR Tylenol ordered.  On previous chart evaluation, patient had an echo earlier this month and showed an EF of 50 to 55%.  Sepsis fluids ordered.  Started off broad although suspect there is may be some pulmonary involvement.  When chest x-ray resulted and shows possible right-sided pneumonia, I canceled the cefepime and Flagyl.  Patient already had 1 g of Rocephin via EMS.  Will order azithromycin for CAP coverage.  Patient's blood pressure continues to  improve with fluids.  He is pleasant.  Has no complaints.  I independently reviewed and interpreted the patient's labs.  CMP shows mildly decreased sodium at 131.  Mild decrease in chloride 94.  Elevated glucose at 137.  Mild decrease in calcium, protein, and albumin.  CBC without leukocytosis or anemia.  APTT and PT/INR normal.  BNP slightly elevated at 222.  Blood cultures pending.  COVID and flu negative.  Urinalysis unremarkable.  Lactic acid initially elevated at 2.0 with repeat at 2.1.  I did order some CT imaging given the patient had a question fall and he is on blood thinners.  CT cervical spine and head read as  1. No acute intracranial abnormality.  2. No evidence of acute cervical spine fracture or traumatic  malalignment.   Given the patient's new oxygen requirement, fever, and improving blood pressures, I do think this patient meets criteria for admission.  Discussed this with Dr. Manuella Ghazi who will admit the patient.  I discussed this case with my attending physician who cosigned this note including patient's presenting symptoms, physical exam, and planned diagnostics and interventions. Attending physician stated agreement with plan or made changes to plan which were implemented.   Attending physician assessed patient at bedside.  Final Clinical Impression(s) / ED Diagnoses Final diagnoses:  Sepsis with acute respiratory failure, due to unspecified organism, unspecified whether hypoxia or hypercapnia present, unspecified whether septic shock present ([place])  Pneumonia of right lung due to infectious organism, unspecified part of lung  Acute respiratory failure with hypoxia ([place])    Rx / DC Orders ED Discharge Orders  None         Sherrell Puller, Hershal Coria 11/14/21 Arvin Collard, MD 11/17/21 [phone]

## 2021-11-14 NOTE — Progress Notes (Addendum)
Pharmacy Antibiotic Note  Jason Woods is a 76 y.o. male admitted on 11/14/2021 with cough and AMS. Sepsis protocol initiated.  Pharmacy has been consulted for vancomycin and cefepime dosing.  Plan: Vancomycin 2000 mg IV x1, followed by vancomycin 1.25 mg IV Q24h (eAUC 541, goal AUC 400-550, Scr 1.17, Vd 50.1). Cefepime 2gm IV q12h.  Trend WBC, fever, renal function F/u cultures, clinical progress, levels as indicated De-escalate when able   Height: '5\' 7"'$  (170.2 cm) Weight: 77.1 kg (170 lb) IBW/kg (Calculated) : 66.1  Temp (24hrs), Avg:103.6 F (39.8 C), Min:103.6 F (39.8 C), Max:103.6 F (39.8 C)  Recent Labs  Lab 11/14/21 0953 11/14/21 1008  WBC  --  6.3  CREATININE  --  1.17  LATICACIDVEN 2.0*  --     Estimated Creatinine Clearance: 50.2 mL/min (by C-G formula based on SCr of 1.17 mg/dL).    Allergies  Allergen Reactions   Lipitor [Atorvastatin] Other (See Comments)    Aching, MS   Zocor [Simvastatin-High Dose] Other (See Comments)    MS aches   Celexa [Citalopram Hydrobromide] Other (See Comments)    Nausea,blurred vision   Crestor [Rosuvastatin]     Pain, aching muscles   Mobic [Meloxicam] Other (See Comments)    nausea   Prozac [Fluoxetine Hcl] Other (See Comments)    fatigue    Antimicrobials this admission: Cefepime 8/25 >> Vancomycin 8/25 >>   Microbiology results: 08/25 BCx: pending 08/25 UCx: pending  08/25 MRSA PCR: pending 08/25 RT-PCR: pending  Thank you for allowing pharmacy to be a part of this patient's care.  Narda Amber 11/14/2021 10:47 AM

## 2021-11-14 NOTE — Progress Notes (Signed)
[facility] following sepsis protocol.  

## 2021-11-14 NOTE — ED Triage Notes (Signed)
Pt arrived via [facility] from home with complaints of altered mental status. Family called and reported pt has had bad cough throughout the night. EMS states pt was 85% on room air when they arrived.

## 2021-11-14 NOTE — ED Notes (Signed)
Patient transported to CT 

## 2021-11-15 DIAGNOSIS — I10 Essential (primary) hypertension: Secondary | ICD-10-CM | POA: Diagnosis present

## 2021-11-15 DIAGNOSIS — E7800 Pure hypercholesterolemia, unspecified: Secondary | ICD-10-CM | POA: Diagnosis present

## 2021-11-15 DIAGNOSIS — Z20822 Contact with and (suspected) exposure to covid-19: Secondary | ICD-10-CM | POA: Diagnosis present

## 2021-11-15 DIAGNOSIS — J9601 Acute respiratory failure with hypoxia: Secondary | ICD-10-CM | POA: Diagnosis not present

## 2021-11-15 DIAGNOSIS — M545 Low back pain, unspecified: Secondary | ICD-10-CM | POA: Diagnosis present

## 2021-11-15 DIAGNOSIS — M19012 Primary osteoarthritis, left shoulder: Secondary | ICD-10-CM | POA: Diagnosis present

## 2021-11-15 DIAGNOSIS — G9341 Metabolic encephalopathy: Secondary | ICD-10-CM | POA: Diagnosis present

## 2021-11-15 DIAGNOSIS — J189 Pneumonia, unspecified organism: Secondary | ICD-10-CM | POA: Diagnosis present

## 2021-11-15 DIAGNOSIS — F32A Depression, unspecified: Secondary | ICD-10-CM | POA: Diagnosis present

## 2021-11-15 DIAGNOSIS — E119 Type 2 diabetes mellitus without complications: Secondary | ICD-10-CM | POA: Diagnosis present

## 2021-11-15 DIAGNOSIS — M19011 Primary osteoarthritis, right shoulder: Secondary | ICD-10-CM | POA: Diagnosis present

## 2021-11-15 DIAGNOSIS — F17210 Nicotine dependence, cigarettes, uncomplicated: Secondary | ICD-10-CM | POA: Diagnosis present

## 2021-11-15 DIAGNOSIS — E871 Hypo-osmolality and hyponatremia: Secondary | ICD-10-CM | POA: Diagnosis present

## 2021-11-15 DIAGNOSIS — F419 Anxiety disorder, unspecified: Secondary | ICD-10-CM | POA: Diagnosis present

## 2021-11-15 DIAGNOSIS — K219 Gastro-esophageal reflux disease without esophagitis: Secondary | ICD-10-CM | POA: Diagnosis present

## 2021-11-15 DIAGNOSIS — E539 Vitamin B deficiency, unspecified: Secondary | ICD-10-CM | POA: Diagnosis present

## 2021-11-15 DIAGNOSIS — I484 Atypical atrial flutter: Secondary | ICD-10-CM | POA: Diagnosis present

## 2021-11-15 DIAGNOSIS — E876 Hypokalemia: Secondary | ICD-10-CM | POA: Diagnosis present

## 2021-11-15 DIAGNOSIS — E663 Overweight: Secondary | ICD-10-CM | POA: Diagnosis present

## 2021-11-15 DIAGNOSIS — I4891 Unspecified atrial fibrillation: Secondary | ICD-10-CM | POA: Diagnosis present

## 2021-11-15 DIAGNOSIS — Z6826 Body mass index (BMI) 26.0-26.9, adult: Secondary | ICD-10-CM | POA: Diagnosis not present

## 2021-11-15 DIAGNOSIS — J440 Chronic obstructive pulmonary disease with acute lower respiratory infection: Secondary | ICD-10-CM | POA: Diagnosis present

## 2021-11-15 DIAGNOSIS — J154 Pneumonia due to other streptococci: Secondary | ICD-10-CM | POA: Diagnosis present

## 2021-11-15 DIAGNOSIS — Z794 Long term (current) use of insulin: Secondary | ICD-10-CM | POA: Diagnosis not present

## 2021-11-15 LAB — CBC
HCT: 34.1 % — ABNORMAL LOW (ref 39.0–52.0)
Hemoglobin: 11.6 g/dL — ABNORMAL LOW (ref 13.0–17.0)
MCH: 30.5 pg (ref 26.0–34.0)
MCHC: 34 g/dL (ref 30.0–36.0)
MCV: 89.7 fL (ref 80.0–100.0)
Platelets: 163 10*3/uL (ref 150–400)
RBC: 3.8 MIL/uL — ABNORMAL LOW (ref 4.22–5.81)
RDW: 13.8 % (ref 11.5–15.5)
WBC: 10.8 10*3/uL — ABNORMAL HIGH (ref 4.0–10.5)
nRBC: 0 % (ref 0.0–0.2)

## 2021-11-15 LAB — BASIC METABOLIC PANEL
Anion gap: 5 (ref 5–15)
BUN: 16 mg/dL (ref 8–23)
CO2: 28 mmol/L (ref 22–32)
Calcium: 7.8 mg/dL — ABNORMAL LOW (ref 8.9–10.3)
Chloride: 98 mmol/L (ref 98–111)
Creatinine, Ser: 1.06 mg/dL (ref 0.61–1.24)
GFR, Estimated: >60 mL/min (ref >60)
Glucose, Bld: 129 mg/dL — ABNORMAL HIGH (ref 70–99)
Potassium: 3.4 mmol/L — ABNORMAL LOW (ref 3.5–5.1)
Sodium: 131 mmol/L — ABNORMAL LOW (ref 135–145)

## 2021-11-15 LAB — GLUCOSE, CAPILLARY
Glucose-Capillary: 116 mg/dL — ABNORMAL HIGH (ref 70–99)
Glucose-Capillary: 148 mg/dL — ABNORMAL HIGH (ref 70–99)
Glucose-Capillary: 208 mg/dL — ABNORMAL HIGH (ref 70–99)
Glucose-Capillary: 111 mg/dL — ABNORMAL HIGH (ref 70–99)
Glucose-Capillary: 134 mg/dL — ABNORMAL HIGH (ref 70–99)

## 2021-11-15 LAB — MAGNESIUM: Magnesium: 1.6 mg/dL — ABNORMAL LOW (ref 1.7–2.4)

## 2021-11-15 LAB — URINE CULTURE: Culture: NO GROWTH

## 2021-11-15 LAB — STREP PNEUMONIAE URINARY ANTIGEN: Strep Pneumo Urinary Antigen: NEGATIVE

## 2021-11-15 MED ORDER — BUPRENORPHINE HCL 2 MG SL SUBL
2.0000 mg | SUBLINGUAL_TABLET | Freq: Every day | SUBLINGUAL | Status: DC
  Administered 2021-11-15 – 2021-11-16 (×2): 2 mg via SUBLINGUAL
  Filled 2021-11-15 (×2): qty 1

## 2021-11-15 MED ORDER — MELATONIN 3 MG PO TABS
6.0000 mg | ORAL_TABLET | Freq: Once | ORAL | Status: AC
  Administered 2021-11-15: 6 mg via ORAL
  Filled 2021-11-15: qty 2

## 2021-11-15 MED ORDER — OXYCODONE-ACETAMINOPHEN 5-325 MG PO TABS: 2.0000 | ORAL_TABLET | Freq: Two times a day (BID) | ORAL | Status: DC | PRN

## 2021-11-15 MED ORDER — TRAZODONE HCL 50 MG PO TABS
200.0000 mg | ORAL_TABLET | Freq: Every evening | ORAL | Status: DC | PRN
  Administered 2021-11-15: 200 mg via ORAL
  Filled 2021-11-15: qty 4

## 2021-11-15 MED ORDER — POTASSIUM CHLORIDE CRYS ER 20 MEQ PO TBCR
40.0000 meq | EXTENDED_RELEASE_TABLET | Freq: Once | ORAL | Status: AC
  Administered 2021-11-15: 40 meq via ORAL
  Filled 2021-11-15: qty 2

## 2021-11-15 MED ORDER — ALPRAZOLAM 0.5 MG PO TABS
0.5000 mg | ORAL_TABLET | Freq: Three times a day (TID) | ORAL | Status: DC
  Administered 2021-11-15 – 2021-11-16 (×4): 0.5 mg via ORAL
  Filled 2021-11-15 (×4): qty 1

## 2021-11-15 MED ORDER — GABAPENTIN 400 MG PO CAPS
400.0000 mg | ORAL_CAPSULE | Freq: Two times a day (BID) | ORAL | Status: DC
  Administered 2021-11-15 – 2021-11-16 (×3): 400 mg via ORAL
  Filled 2021-11-15 (×3): qty 1

## 2021-11-15 MED ORDER — MAGNESIUM SULFATE 2 GM/50ML IV SOLN
2.0000 g | Freq: Once | INTRAVENOUS | Status: AC
  Administered 2021-11-15: 2 g via INTRAVENOUS
  Filled 2021-11-15: qty 50

## 2021-11-15 MED ORDER — DULOXETINE HCL 30 MG PO CPEP
30.0000 mg | ORAL_CAPSULE | Freq: Every day | ORAL | Status: DC
  Administered 2021-11-15 – 2021-11-16 (×2): 30 mg via ORAL
  Filled 2021-11-15 (×2): qty 1

## 2021-11-15 NOTE — TOC Progression Note (Signed)
  Transition of Care [facility]) Screening Note   Patient Details  Name: Jason Woods Date of Birth: Aug 11, 1945   Transition of Care [facility]) CM/SW Contact:    Shade Flood, LCSW Phone Number: 11/15/2021, 12:02 PM    Transition of Care Department [facility]) has reviewed patient and no TOC needs have been identified at this time. We will continue to monitor patient advancement through interdisciplinary progression rounds. If new patient transition needs arise, please place a TOC consult.

## 2021-11-15 NOTE — Progress Notes (Addendum)
PROGRESS NOTE    Jason Woods  INO:[id number] DOB: 06-01-1945 DOA: 11/14/2021 PCP: Glenda Chroman, MD   Brief Narrative:    Jason Woods is a 76 y.o. male with medical history significant for anxiety/depression, recurrent falls, chronic opiate use, dyslipidemia, GERD, hypertension, type 2 diabetes, atrial flutter on Eliquis, and COPD with ongoing tobacco abuse who was brought to the ED today via EMS on account of worsening confusion as well as ongoing cough.  Patient was admitted for community-acquired pneumonia with acute hypoxemic respiratory failure.  He was started empirically on Rocephin and azithromycin.  Assessment & Plan:   Principal Problem:   Acute hypoxemic respiratory failure (HCC) Active Problems:   High cholesterol   Hypertension   Gastroesophageal reflux disease   PNA (pneumonia)   COPD  GOLD ? / still smoking    Cigarette smoker   Atypical atrial flutter (HCC)   Pneumonia  Assessment and Plan:  Acute hypoxemic respiratory failure secondary to community-acquired pneumonia New right lung opacities noted on imaging Follow-up blood cultures Strep pneumonia and Legionella antigen pending Continue on Rocephin and azithromycin Antitussives Wean oxygen as tolerated   Acute metabolic encephalopathy secondary to above vs polypharmacy Continue to monitor carefully while being treated Hold any narcotics and medications for anxiety/depression for now CT head and neck with no acute findings   Mild hyponatremia-persistent Likely related to home HCTZ use Discontinue HCTZ for now Continue to monitor BMP   History of COPD with ongoing tobacco abuse DuoNebs as needed with no acute bronchospasms noted Likely will require home oxygen on discharge Counseled on cessation of tobacco abuse   History of atrial flutter CHA2DS2-VASc score of 4 for which patient is on Eliquis Toprol 25 mg daily Telemetry monitoring May need to consider discontinuation of Eliquis if  recurrent falls are noted and patient not being discharged to a safe environment   Type 2 diabetes Continue SSI and carb modified diet Hemoglobin A1c 6.6%   Dyslipidemia Continue statin   Hypertension Continue home medications with the exception of HCTZ as noted above   Anxiety/depression Resume Home medications   GERD PPI   Chronic opiate use Resume home opiates   Debility with recurrent falls likely related to polypharmacy Fall precautions PT/OT evaluation pending  Hypomagnesemia/hypokalemia Replete and reevaluate in a.m.   Overweight BMI 26.63    DVT prophylaxis: Eliquis Code Status: Full Family Communication: Sister at bedside 8/26 Disposition Plan:  Status is: Inpatient Remains inpatient appropriate because: Need for IV antibiotics.   Consultants:  None  Procedures:  None  Antimicrobials:  Anti-infectives (From admission, onward)    Start     Dose/Rate Route Frequency Ordered Stop   11/15/21 1000  vancomycin (VANCOREADY) IVPB 1250 mg/250 mL  Status:  Discontinued        1,250 mg 166.7 mL/hr over 90 Minutes Intravenous Every 24 hours 11/14/21 1052 11/14/21 1325   11/15/21 1000  azithromycin (ZITHROMAX) 500 mg in sodium chloride 0.9 % 250 mL IVPB        500 mg 250 mL/hr over 60 Minutes Intravenous Every 24 hours 11/14/21 1326 11/19/21 0959   11/14/21 2230  ceFEPIme (MAXIPIME) 2 g in sodium chloride 0.9 % 100 mL IVPB  Status:  Discontinued        2 g 200 mL/hr over 30 Minutes Intravenous Every 12 hours 11/14/21 1039 11/14/21 1325   11/14/21 2200  cefTRIAXone (ROCEPHIN) 1 g in sodium chloride 0.9 % 100 mL IVPB  1 g 200 mL/hr over 30 Minutes Intravenous Every 24 hours 11/14/21 1326     11/14/21 1115  vancomycin (VANCOCIN) IVPB 1000 mg/200 mL premix        1,000 mg 200 mL/hr over 60 Minutes Intravenous  Once 11/14/21 1010 11/14/21 1331   11/14/21 1045  azithromycin (ZITHROMAX) 500 mg in sodium chloride 0.9 % 250 mL IVPB        500 mg 250 mL/hr  over 60 Minutes Intravenous  Once 11/14/21 1034 11/14/21 1155   11/14/21 1000  ceFEPIme (MAXIPIME) 2 g in sodium chloride 0.9 % 100 mL IVPB  Status:  Discontinued        2 g 200 mL/hr over 30 Minutes Intravenous  Once 11/14/21 0955 11/14/21 1034   11/14/21 1000  metroNIDAZOLE (FLAGYL) IVPB 500 mg  Status:  Discontinued        500 mg 100 mL/hr over 60 Minutes Intravenous  Once 11/14/21 0955 11/14/21 1033   11/14/21 1000  vancomycin (VANCOCIN) IVPB 1000 mg/200 mL premix        1,000 mg 200 mL/hr over 60 Minutes Intravenous  Once 11/14/21 0955 11/14/21 1134       Subjective: Patient seen and evaluated today with no new acute complaints or concerns. No acute concerns or events noted overnight.  He was still noted to be febrile overnight.  Objective: Vitals:   11/14/21 1525 11/14/21 2143 11/15/21 0400 11/15/21 0735  BP: (!) 134/54 (!) 109/54 (!) 141/58 (!) 130/59  Pulse: 79 64 73 72  Resp: '19 19 20 18  '$ Temp: 97.9 F (36.6 C) 98.6 F (37 C) 98.4 F (36.9 C) 98.4 F (36.9 C)  TempSrc: Oral Oral    SpO2: 99% 95% 98% 96%  Weight: 76.7 kg     Height: '5\' 7"'$  (1.702 m)       Intake/Output Summary (Last 24 hours) at 11/15/2021 1302 Last data filed at 11/15/2021 0400 Gross per 24 hour  Intake 240 ml  Output 1575 ml  Net -1335 ml   Filed Weights   11/14/21 0952 11/14/21 1525  Weight: 77.1 kg 76.7 kg    Examination:  General exam: Appears calm and comfortable  Respiratory system: Clear to auscultation. Respiratory effort normal.  Currently on 4 L nasal cannula. Cardiovascular system: S1 & S2 heard, RRR.  Gastrointestinal system: Abdomen is soft Central nervous system: Alert and awake Extremities: No edema Skin: No significant lesions noted Psychiatry: Flat affect.    Data Reviewed: I have personally reviewed following labs and imaging studies  CBC: Recent Labs  Lab 11/14/21 1008 11/15/21 0658  WBC 6.3 10.8*  NEUTROABS 5.4  --   HGB 13.7 11.6*  HCT 40.5 34.1*  MCV  89.4 89.7  PLT 182 696   Basic Metabolic Panel: Recent Labs  Lab 11/14/21 1008 11/15/21 0658  NA 131* 131*  K 3.5 3.4*  CL 94* 98  CO2 28 28  GLUCOSE 137* 129*  BUN 15 16  CREATININE 1.17 1.06  CALCIUM 8.3* 7.8*  MG  --  1.6*   GFR: Estimated Creatinine Clearance: 55.4 mL/min (by C-G formula based on SCr of 1.06 mg/dL). Liver Function Tests: Recent Labs  Lab 11/14/21 1008  AST 15  ALT 11  ALKPHOS 79  BILITOT 1.1  PROT 6.0*  ALBUMIN 3.1*   No results for input(s): "LIPASE", "AMYLASE" in the last 168 hours. No results for input(s): "AMMONIA" in the last 168 hours. Coagulation Profile: Recent Labs  Lab 11/14/21 1008  INR 1.2  Cardiac Enzymes: No results for input(s): "CKTOTAL", "CKMB", "CKMBINDEX", "TROPONINI" in the last 168 hours. BNP (last 3 results) No results for input(s): "PROBNP" in the last 8760 hours. HbA1C: Recent Labs    11/14/21 1042  HGBA1C 6.6*   CBG: Recent Labs  Lab 11/14/21 1635 11/14/21 2142 11/15/21 0738 11/15/21 0746 11/15/21 1121  GLUCAP 183* 119* 116* 148* 208*   Lipid Profile: No results for input(s): "CHOL", "HDL", "LDLCALC", "TRIG", "CHOLHDL", "LDLDIRECT" in the last 72 hours. Thyroid Function Tests: No results for input(s): "TSH", "T4TOTAL", "FREET4", "T3FREE", "THYROIDAB" in the last 72 hours. Anemia Panel: No results for input(s): "VITAMINB12", "FOLATE", "FERRITIN", "TIBC", "IRON", "RETICCTPCT" in the last 72 hours. Sepsis Labs: Recent Labs  Lab 11/14/21 0953 11/14/21 1225  LATICACIDVEN 2.0* 2.1*    Recent Results (from the past 240 hour(s))  Blood Culture (routine x 2)     Status: None (Preliminary result)   Collection Time: 11/14/21  9:53 AM   Specimen: BLOOD LEFT FOREARM  Result Value Ref Range Status   Specimen Description BLOOD LEFT FOREARM  Final   Special Requests   Final    BOTTLES DRAWN AEROBIC ONLY Blood Culture results may not be optimal due to an inadequate volume of blood received in culture  bottles Performed at [facility], [address], [place], [place] [address]    Culture PENDING  Incomplete   Report Status PENDING  Incomplete  Urine Culture     Status: None   Collection Time: 11/14/21  9:53 AM   Specimen: Urine, Catheterized  Result Value Ref Range Status   Specimen Description   Final    URINE, CATHETERIZED Performed at [facility], [address], [place], [place] [address]    Special Requests   Final    NONE Performed at [facility], [address] [place], [place] [address]    Culture   Final    NO GROWTH Performed at [facility], [place] [address], [place], [place] [address]    Report Status 11/15/2021 FINAL  Final  Blood Culture (routine x 2)     Status: None (Preliminary result)   Collection Time: 11/14/21  9:58 AM   Specimen: BLOOD RIGHT HAND  Result Value Ref Range Status   Specimen Description BLOOD RIGHT HAND  Final   Special Requests   Final    BOTTLES DRAWN AEROBIC AND ANAEROBIC Blood Culture adequate volume Performed at [facility] [place], [address], [place], [place] [address]    Culture PENDING  Incomplete   Report Status PENDING  Incomplete  Resp Panel by RT-PCR (Flu A&B, Covid) Anterior Nasal Swab     Status: None   Collection Time: 11/14/21 11:16 AM   Specimen: Anterior Nasal Swab  Result Value Ref Range Status   SARS Coronavirus 2 by RT PCR NEGATIVE NEGATIVE Final    Comment: (NOTE) SARS-CoV-2 target nucleic acids are NOT DETECTED.  The SARS-CoV-2 RNA is generally detectable in upper respiratory specimens during the acute phase of infection. The lowest concentration of SARS-CoV-2 viral copies this assay can detect is 138 copies/mL. A negative result does not preclude SARS-Cov-2 infection and should not be used as the sole basis for treatment or other patient management decisions. A negative result may occur with  improper specimen collection/handling, submission of specimen other than nasopharyngeal swab, presence of viral  mutation(s) within the areas targeted by this assay, and inadequate number of viral copies(<138 copies/mL). A negative result must be combined with clinical observations, patient history, and epidemiological  information. The expected result is Negative.  Fact Sheet for Patients:  EntrepreneurPulse.com.au  Fact Sheet for Healthcare Providers:  IncredibleEmployment.be  This test is no t yet approved or cleared by the [place] FDA and  has been authorized for detection and/or diagnosis of SARS-CoV-2 by FDA under an Emergency Use Authorization (EUA). This EUA will remain  in effect (meaning this test can be used) for the duration of the COVID-19 declaration under Section 564(b)(1) of the Act, 21 U.S.C.section 360bbb-3(b)(1), unless the authorization is terminated  or revoked sooner.       Influenza A by PCR NEGATIVE NEGATIVE Final   Influenza B by PCR NEGATIVE NEGATIVE Final    Comment: (NOTE) The Xpert Xpress SARS-CoV-2/FLU/RSV plus assay is intended as an aid in the diagnosis of influenza from Nasopharyngeal swab specimens and should not be used as a sole basis for treatment. Nasal washings and aspirates are unacceptable for Xpert Xpress SARS-CoV-2/FLU/RSV testing.  Fact Sheet for Patients: EntrepreneurPulse.com.au  Fact Sheet for Healthcare Providers: IncredibleEmployment.be  This test is not yet approved or cleared by the [place] FDA and has been authorized for detection and/or diagnosis of SARS-CoV-2 by FDA under an Emergency Use Authorization (EUA). This EUA will remain in effect (meaning this test can be used) for the duration of the COVID-19 declaration under Section 564(b)(1) of the Act, 21 U.S.C. section 360bbb-3(b)(1), unless the authorization is terminated or revoked.  Performed at [facility], [address], [place], [place] [address]   MRSA Next Gen by PCR, Nasal     Status: None    Collection Time: 11/14/21 11:54 AM   Specimen: Nasal Mucosa; Nasal Swab  Result Value Ref Range Status   MRSA by PCR Next Gen NOT DETECTED NOT DETECTED Final    Comment: (NOTE) The GeneXpert MRSA Assay (FDA approved for NASAL specimens only), is one component of a comprehensive MRSA colonization surveillance program. It is not intended to diagnose MRSA infection nor to guide or monitor treatment for MRSA infections. Test performance is not FDA approved in patients less than 46 years old. Performed at [facility], [address] [place], [place] [address]          Radiology Studies: CT Head Wo Contrast  Result Date: 11/14/2021 CLINICAL DATA:  Neck trauma EXAM: CT HEAD WITHOUT CONTRAST CT CERVICAL SPINE WITHOUT CONTRAST TECHNIQUE: Multidetector CT imaging of the head and cervical spine was performed following the standard protocol without intravenous contrast. Multiplanar CT image reconstructions of the cervical spine were also generated. RADIATION DOSE REDUCTION: This exam was performed according to the departmental dose-optimization program which includes automated exposure control, adjustment of the mA and/or kV according to patient size and/or use of iterative reconstruction technique. COMPARISON:  CT head and cervical spine dated September 13, 2021 FINDINGS: CT HEAD FINDINGS Brain: Mild generalized atrophy. No evidence of acute infarction, hemorrhage, hydrocephalus, extra-axial collection or mass lesion/mass effect. Vascular: No hyperdense vessel or unexpected calcification. Skull: Normal. Negative for fracture or focal lesion. Sinuses/Orbits: No acute finding. Other: None. CT CERVICAL SPINE FINDINGS Alignment: Normal. Skull base and vertebrae: No acute fracture. No primary bone lesion or focal pathologic process. Soft tissues and spinal canal: No prevertebral fluid or swelling. No visible canal hematoma. Disc levels: Moderate multilevel degenerative disc disease, unchanged when compared with  prior exam. Upper chest: Mild linear opacities of the right lung apex, likely due to scarring or atelectasis. Other: None. IMPRESSION: 1. No acute intracranial abnormality. 2. No evidence of acute cervical spine fracture  or traumatic malalignment. Electronically Signed   By: Yetta Glassman M.D.   On: 11/14/2021 11:41   CT Cervical Spine Wo Contrast  Result Date: 11/14/2021 CLINICAL DATA:  Neck trauma EXAM: CT HEAD WITHOUT CONTRAST CT CERVICAL SPINE WITHOUT CONTRAST TECHNIQUE: Multidetector CT imaging of the head and cervical spine was performed following the standard protocol without intravenous contrast. Multiplanar CT image reconstructions of the cervical spine were also generated. RADIATION DOSE REDUCTION: This exam was performed according to the departmental dose-optimization program which includes automated exposure control, adjustment of the mA and/or kV according to patient size and/or use of iterative reconstruction technique. COMPARISON:  CT head and cervical spine dated September 13, 2021 FINDINGS: CT HEAD FINDINGS Brain: Mild generalized atrophy. No evidence of acute infarction, hemorrhage, hydrocephalus, extra-axial collection or mass lesion/mass effect. Vascular: No hyperdense vessel or unexpected calcification. Skull: Normal. Negative for fracture or focal lesion. Sinuses/Orbits: No acute finding. Other: None. CT CERVICAL SPINE FINDINGS Alignment: Normal. Skull base and vertebrae: No acute fracture. No primary bone lesion or focal pathologic process. Soft tissues and spinal canal: No prevertebral fluid or swelling. No visible canal hematoma. Disc levels: Moderate multilevel degenerative disc disease, unchanged when compared with prior exam. Upper chest: Mild linear opacities of the right lung apex, likely due to scarring or atelectasis. Other: None. IMPRESSION: 1. No acute intracranial abnormality. 2. No evidence of acute cervical spine fracture or traumatic malalignment. Electronically Signed   By:  Yetta Glassman M.D.   On: 11/14/2021 11:41   DG Chest Port 1 View  Result Date: 11/14/2021 CLINICAL DATA:  Questionable sepsis - evaluate for abnormality. Altered mental status. Cough. EXAM: PORTABLE CHEST 1 VIEW COMPARISON:  Chest radiographs 09/13/2021 FINDINGS: The patient is mildly rotated to the right with unchanged cardiomediastinal silhouette. There are new patchy airspace opacities throughout the right lung, greatest in the mid upper lung. The left lung is clear. No sizable pleural effusion or pneumothorax is identified. No acute osseous abnormality is seen. IMPRESSION: New right lung opacities concerning for pneumonia. Electronically Signed   By: Logan Bores M.D.   On: 11/14/2021 10:15        Scheduled Meds:  ALPRAZolam  0.5 mg Oral TID   apixaban  5 mg Oral BID   buprenorphine  2 mg Sublingual Daily   dorzolamide  1 drop Both Eyes TID   DULoxetine  30 mg Oral Daily   fluticasone  2 spray Each Nare Daily   fluticasone furoate-vilanterol  1 puff Inhalation Daily   gabapentin  400 mg Oral BID   insulin aspart  0-15 Units Subcutaneous TID WC   insulin aspart  0-5 Units Subcutaneous QHS   latanoprost  1 drop Right Eye QHS   loratadine  10 mg Oral Daily   losartan  100 mg Oral Daily   metoprolol succinate  25 mg Oral Daily   pantoprazole  40 mg Oral Daily   rosuvastatin  5 mg Oral Weekly   Continuous Infusions:  azithromycin 500 mg (11/15/21 1004)   cefTRIAXone (ROCEPHIN)  IV 1 g (11/14/21 2221)   magnesium sulfate bolus IVPB 2 g (11/15/21 1218)     LOS: 0 days    Time spent: 35 minutes    Dameka Younker Darleen Crocker, DO [facility]  If 7PM-7AM, please contact night-coverage [email or web address] 11/15/2021, 1:02 PM

## 2021-11-16 DIAGNOSIS — J9601 Acute respiratory failure with hypoxia: Secondary | ICD-10-CM | POA: Diagnosis not present

## 2021-11-16 LAB — CBC
HCT: 32.9 % — ABNORMAL LOW (ref 39.0–52.0)
Hemoglobin: 11.1 g/dL — ABNORMAL LOW (ref 13.0–17.0)
MCH: 30.4 pg (ref 26.0–34.0)
MCHC: 33.7 g/dL (ref 30.0–36.0)
MCV: 90.1 fL (ref 80.0–100.0)
Platelets: 171 10*3/uL (ref 150–400)
RBC: 3.65 MIL/uL — ABNORMAL LOW (ref 4.22–5.81)
RDW: 13.8 % (ref 11.5–15.5)
WBC: 9 10*3/uL (ref 4.0–10.5)
nRBC: 0 % (ref 0.0–0.2)

## 2021-11-16 LAB — BASIC METABOLIC PANEL
Anion gap: 6 (ref 5–15)
BUN: 16 mg/dL (ref 8–23)
CO2: 26 mmol/L (ref 22–32)
Calcium: 7.9 mg/dL — ABNORMAL LOW (ref 8.9–10.3)
Chloride: 100 mmol/L (ref 98–111)
Creatinine, Ser: 1.02 mg/dL (ref 0.61–1.24)
GFR, Estimated: >60 mL/min (ref >60)
Glucose, Bld: 129 mg/dL — ABNORMAL HIGH (ref 70–99)
Potassium: 3.3 mmol/L — ABNORMAL LOW (ref 3.5–5.1)
Sodium: 132 mmol/L — ABNORMAL LOW (ref 135–145)

## 2021-11-16 LAB — MAGNESIUM: Magnesium: 1.7 mg/dL (ref 1.7–2.4)

## 2021-11-16 LAB — GLUCOSE, CAPILLARY
Glucose-Capillary: 143 mg/dL — ABNORMAL HIGH (ref 70–99)
Glucose-Capillary: 208 mg/dL — ABNORMAL HIGH (ref 70–99)

## 2021-11-16 MED ORDER — AMOXICILLIN-POT CLAVULANATE 500-125 MG PO TABS: 1.0000 | ORAL_TABLET | Freq: Three times a day (TID) | ORAL | 0 refills | Status: AC

## 2021-11-16 MED ORDER — POTASSIUM CHLORIDE CRYS ER 20 MEQ PO TBCR
40.0000 meq | EXTENDED_RELEASE_TABLET | Freq: Once | ORAL | Status: AC
Start: 2021-11-16 — End: 2021-11-16
  Administered 2021-11-16: 40 meq via ORAL
  Filled 2021-11-16: qty 2

## 2021-11-16 NOTE — Discharge Summary (Signed)
Physician Discharge Summary  Jason Woods CVE:[id number] DOB: Mar 13, 1946 DOA: 11/14/2021  PCP: Glenda Chroman, MD  Admit date: 11/14/2021  Discharge date: 11/16/2021  Admitted From:Home  Disposition:  Home  Recommendations for Outpatient Follow-up:  Follow up with PCP in 1-2 weeks Discontinue HCTZ given some mild hyponatremia Continue Augmentin as prescribed to complete course of treatment for pneumonia for 4 more days Continue other home medications as prior  Home Health: Yes with PT  Equipment/Devices: Has home equipment  Discharge Condition:Stable  CODE STATUS: Full  Diet recommendation: Heart Healthy/carb modified  Brief/Interim Summary: Jason Woods is a 76 y.o. male with medical history significant for anxiety/depression, recurrent falls, chronic opiate use, dyslipidemia, GERD, hypertension, type 2 diabetes, atrial flutter on Eliquis, and COPD with ongoing tobacco abuse who was brought to the ED today via EMS on account of worsening confusion as well as ongoing cough.  Patient was admitted for community-acquired pneumonia with acute hypoxemic respiratory failure.  He was started empirically on Rocephin and azithromycin.  He was treated for a period of a couple days with significant improvement noted and no further fever.  He has no further oxygen requirement and is in stable condition for discharge with home health PT.  He will remain on oral Augmentin as prescribed above.  Discharge Diagnoses:  Principal Problem:   Acute hypoxemic respiratory failure (HCC) Active Problems:   High cholesterol   Hypertension   Gastroesophageal reflux disease   PNA (pneumonia)   COPD  GOLD ? / still smoking    Cigarette smoker   Atypical atrial flutter (HCC)   Pneumonia  Principal discharge diagnosis: Acute hypoxemic respiratory failure secondary to community-acquired pneumonia with associated acute metabolic encephalopathy.  Mild hyponatremia secondary to HCTZ use.  Discharge  Instructions  Discharge Instructions     Diet - low sodium heart healthy   Complete by: As directed    Increase activity slowly   Complete by: As directed       Allergies as of 11/16/2021       Reactions   Lipitor [atorvastatin] Other (See Comments)   Aching, MS   Zocor [simvastatin-high Dose] Other (See Comments)   MS aches   Celexa [citalopram Hydrobromide] Other (See Comments)   Nausea,blurred vision   Crestor [rosuvastatin]    Pain, aching muscles   Mobic [meloxicam] Other (See Comments)   nausea   Prozac [fluoxetine Hcl] Other (See Comments)   fatigue        Medication List     STOP taking these medications    hydrochlorothiazide 25 MG tablet Commonly known as: HYDRODIURIL       TAKE these medications    albuterol 108 (90 Base) MCG/ACT inhaler Commonly known as: VENTOLIN HFA Inhale 1-2 puffs into the lungs every 6 (six) hours as needed for wheezing or shortness of breath.   ALPRAZolam 1 MG tablet Commonly known as: XANAX Take 0.5 tablets (0.5 mg total) by mouth 3 (three) times daily.   amoxicillin-clavulanate 500-125 MG tablet Commonly known as: Augmentin Take 1 tablet (500 mg total) by mouth 3 (three) times daily for 4 days.   apixaban 5 MG Tabs tablet Commonly known as: Eliquis Take 1 tablet (5 mg total) by mouth 2 (two) times daily.   b complex vitamins tablet Take 1 tablet by mouth daily. With C   bimatoprost 0.01 % Soln Commonly known as: LUMIGAN Place 1 drop into the right eye at bedtime.   Breo Ellipta 100-25 MCG/ACT Aepb Generic drug: fluticasone  furoate-vilanterol Inhale 1 puff into the lungs daily.   buprenorphine 2 MG Subl SL tablet Commonly known as: SUBUTEX Place 2 mg under the tongue daily.   cetirizine 10 MG tablet Commonly known as: ZYRTEC Take 10 mg by mouth daily.   Cholecalciferol 50 MCG (2000 UT) Tabs Take 2,000 Units by mouth daily.   cyanocobalamin 2000 MCG tablet Take 2,000 mcg by mouth daily.   docusate  sodium 100 MG capsule Commonly known as: Colace Take 1 capsule (100 mg total) by mouth 2 (two) times daily. While taking narcotic pain medicine.   dorzolamide 2 % ophthalmic solution Commonly known as: TRUSOPT Place 1 drop into both eyes 3 (three) times daily.   DULoxetine 30 MG capsule Commonly known as: CYMBALTA Take 30 mg by mouth daily.   fluticasone 50 MCG/ACT nasal spray Commonly known as: FLONASE Place 2 sprays into both nostrils daily.   FreeStyle Green 2 Reader [place] daily.   FreeStyle Libre 2 Sensor Misc SMARTSIG:Via Meter Every 10 Days   gabapentin 400 MG capsule Commonly known as: NEURONTIN Take 400 mg by mouth 2 (two) times daily.   HumaLOG KwikPen 100 UNIT/ML KwikPen Generic drug: insulin lispro Inject 5 Units into the skin in the morning, at noon, and at bedtime.   hydrOXYzine 25 MG tablet Commonly known as: ATARAX Take 25 mg by mouth at bedtime.   insulin glargine 100 UNIT/ML injection Commonly known as: LANTUS Inject 0.1-0.18 mLs (10-18 Units total) into the skin See admin instructions. Per sliding  16 units in the morning and 8-10 units in the evening   ipratropium-albuterol 0.5-2.5 (3) MG/3ML Soln Commonly known as: DUONEB Take 3 mLs by nebulization every 6 (six) hours as needed for shortness of breath.   ketoconazole 2 % cream Commonly known as: NIZORAL Apply 1 application topically daily.   losartan 100 MG tablet Commonly known as: COZAAR Take 100 mg by mouth daily.   metFORMIN 850 MG tablet Commonly known as: GLUCOPHAGE Take 850 mg by mouth 2 (two) times daily with a meal.   metoprolol succinate 25 MG 24 hr tablet Commonly known as: TOPROL-XL Take 25 mg by mouth daily.   multivitamin with minerals Tabs tablet Take 1 tablet by mouth daily.   oxyCODONE-acetaminophen 10-325 MG tablet Commonly known as: PERCOCET Take 1 tablet by mouth 2 (two) times daily as needed for pain.   pantoprazole 40 MG tablet Commonly known as:  PROTONIX Take 40 mg by mouth daily.   rosuvastatin 5 MG tablet Commonly known as: CRESTOR Take 5 mg by mouth once a week.   Symbicort 160-4.5 MCG/ACT inhaler Generic drug: budesonide-formoterol Inhale 2 puffs into the lungs in the morning and at bedtime.   traZODone 100 MG tablet Commonly known as: DESYREL Take 200 mg by mouth at bedtime as needed for sleep.   triamcinolone cream 0.1 % Commonly known as: KENALOG Apply 1 application topically daily as needed (affected area(s) on skin).   Trulance 3 MG Tabs Generic drug: Plecanatide Take 1 tablet by mouth daily.        Follow-up Information     Vyas, Costella Hatcher, MD. Schedule an appointment as soon as possible for a visit.   Specialty: Internal Medicine Contact information: [place] [address] [phone]                Allergies  Allergen Reactions   Lipitor [Atorvastatin] Other (See Comments)    Aching, MS   Zocor [Simvastatin-High Dose] Other (See Comments)  MS aches   Celexa [Citalopram Hydrobromide] Other (See Comments)    Nausea,blurred vision   Crestor [Rosuvastatin]     Pain, aching muscles   Mobic [Meloxicam] Other (See Comments)    nausea   Prozac [Fluoxetine Hcl] Other (See Comments)    fatigue    Consultations: None   Procedures/Studies: CT Head Wo Contrast  Result Date: 11/14/2021 CLINICAL DATA:  Neck trauma EXAM: CT HEAD WITHOUT CONTRAST CT CERVICAL SPINE WITHOUT CONTRAST TECHNIQUE: Multidetector CT imaging of the head and cervical spine was performed following the standard protocol without intravenous contrast. Multiplanar CT image reconstructions of the cervical spine were also generated. RADIATION DOSE REDUCTION: This exam was performed according to the departmental dose-optimization program which includes automated exposure control, adjustment of the mA and/or kV according to patient size and/or use of iterative reconstruction technique. COMPARISON:  CT head and cervical  spine dated September 13, 2021 FINDINGS: CT HEAD FINDINGS Brain: Mild generalized atrophy. No evidence of acute infarction, hemorrhage, hydrocephalus, extra-axial collection or mass lesion/mass effect. Vascular: No hyperdense vessel or unexpected calcification. Skull: Normal. Negative for fracture or focal lesion. Sinuses/Orbits: No acute finding. Other: None. CT CERVICAL SPINE FINDINGS Alignment: Normal. Skull base and vertebrae: No acute fracture. No primary bone lesion or focal pathologic process. Soft tissues and spinal canal: No prevertebral fluid or swelling. No visible canal hematoma. Disc levels: Moderate multilevel degenerative disc disease, unchanged when compared with prior exam. Upper chest: Mild linear opacities of the right lung apex, likely due to scarring or atelectasis. Other: None. IMPRESSION: 1. No acute intracranial abnormality. 2. No evidence of acute cervical spine fracture or traumatic malalignment. Electronically Signed   By: Yetta Glassman M.D.   On: 11/14/2021 11:41   CT Cervical Spine Wo Contrast  Result Date: 11/14/2021 CLINICAL DATA:  Neck trauma EXAM: CT HEAD WITHOUT CONTRAST CT CERVICAL SPINE WITHOUT CONTRAST TECHNIQUE: Multidetector CT imaging of the head and cervical spine was performed following the standard protocol without intravenous contrast. Multiplanar CT image reconstructions of the cervical spine were also generated. RADIATION DOSE REDUCTION: This exam was performed according to the departmental dose-optimization program which includes automated exposure control, adjustment of the mA and/or kV according to patient size and/or use of iterative reconstruction technique. COMPARISON:  CT head and cervical spine dated September 13, 2021 FINDINGS: CT HEAD FINDINGS Brain: Mild generalized atrophy. No evidence of acute infarction, hemorrhage, hydrocephalus, extra-axial collection or mass lesion/mass effect. Vascular: No hyperdense vessel or unexpected calcification. Skull: Normal.  Negative for fracture or focal lesion. Sinuses/Orbits: No acute finding. Other: None. CT CERVICAL SPINE FINDINGS Alignment: Normal. Skull base and vertebrae: No acute fracture. No primary bone lesion or focal pathologic process. Soft tissues and spinal canal: No prevertebral fluid or swelling. No visible canal hematoma. Disc levels: Moderate multilevel degenerative disc disease, unchanged when compared with prior exam. Upper chest: Mild linear opacities of the right lung apex, likely due to scarring or atelectasis. Other: None. IMPRESSION: 1. No acute intracranial abnormality. 2. No evidence of acute cervical spine fracture or traumatic malalignment. Electronically Signed   By: Yetta Glassman M.D.   On: 11/14/2021 11:41   DG Chest Port 1 View  Result Date: 11/14/2021 CLINICAL DATA:  Questionable sepsis - evaluate for abnormality. Altered mental status. Cough. EXAM: PORTABLE CHEST 1 VIEW COMPARISON:  Chest radiographs 09/13/2021 FINDINGS: The patient is mildly rotated to the right with unchanged cardiomediastinal silhouette. There are new patchy airspace opacities throughout the right lung, greatest in the mid upper lung. The  left lung is clear. No sizable pleural effusion or pneumothorax is identified. No acute osseous abnormality is seen. IMPRESSION: New right lung opacities concerning for pneumonia. Electronically Signed   By: Logan Bores M.D.   On: 11/14/2021 10:15   ECHOCARDIOGRAM COMPLETE  Result Date: 10/28/2021    ECHOCARDIOGRAM REPORT   Patient Name:   TODDY BOYD Date of Exam: 10/28/2021 Medical Rec #:  [id number]         Height:       67.0 in Accession #:    [id number]        Weight:       163.1 lb Date of Birth:  [birth year]        BSA:          1.855 m Patient Age:    45 years          BP:           121/69 mmHg Patient Gender: M                 HR:           79 bpm. Exam Location:  [facility] Procedure: 2D Echo, Cardiac Doppler and Color Doppler Indications:    I48.4 Atypical atrial flutter   History:        Patient has prior history of Echocardiogram examinations, most                 recent 05/20/2021. COPD, Arrythmias:Atrial Flutter; Risk                 Factors:Hypertension, Diabetes, Dyslipidemia and Current Smoker.  Sonographer:    Jeneen Montgomery RDMS, RVT, RDCS Referring Phys: [id number] CL[place] R F[place]  Sonographer Comments: Global longitudinal strain was attempted. IMPRESSIONS  1. Left ventricular ejection fraction, by estimation, is 50 to 55%. The left ventricle has low normal function. The left ventricle demonstrates global hypokinesis. There is mild asymmetric left ventricular hypertrophy of the septal segment. Left ventricular diastolic parameters are indeterminate. The average left ventricular global longitudinal strain is -14.1 %. The global longitudinal strain is abnormal.  2. RV-RA gradient normal at 13 mmHg suggesting normal RVSP in the setting of normal CVP. Right ventricular systolic function is normal. The right ventricular size is normal.  3. Left atrial size was upper normal.  4. The mitral valve is degenerative. Mild mitral valve regurgitation.  5. The aortic valve is tricuspid. There is mild calcification of the aortic valve. Aortic valve regurgitation is not visualized. Aortic valve sclerosis/calcification is present, without any evidence of aortic stenosis.  6. Unable to estimate CVP. Comparison(s): Prior images unable to be directly viewed. FINDINGS  Left Ventricle: Left ventricular ejection fraction, by estimation, is 50 to 55%. The left ventricle has low normal function. The left ventricle demonstrates global hypokinesis. The average left ventricular global longitudinal strain is -14.1 %. The global longitudinal strain is abnormal. The left ventricular internal cavity size was normal in size. There is mild asymmetric left ventricular hypertrophy of the septal segment. Left ventricular diastolic parameters are indeterminate. Right Ventricle: RV-RA gradient normal at 13 mmHg  suggesting normal RVSP in the setting of normal CVP. The right ventricular size is normal. No increase in right ventricular wall thickness. Right ventricular systolic function is normal. Left Atrium: Left atrial size was upper normal. Right Atrium: Right atrial size was normal in size. Pericardium: There is no evidence of pericardial effusion. Mitral Valve: The mitral valve is degenerative in appearance. There is mild calcification of  the mitral valve leaflet(s). Mild mitral valve regurgitation. Tricuspid Valve: The tricuspid valve is grossly normal. Tricuspid valve regurgitation is trivial. Aortic Valve: The aortic valve is tricuspid. There is mild calcification of the aortic valve. There is moderate aortic valve annular calcification. Aortic valve regurgitation is not visualized. Aortic valve sclerosis/calcification is present, without any  evidence of aortic stenosis. Aortic valve peak gradient measures 4.3 mmHg. Pulmonic Valve: The pulmonic valve was grossly normal. Pulmonic valve regurgitation is trivial. Aorta: The aortic root is normal in size and structure. Venous: Unable to estimate CVP. The inferior vena cava was not well visualized. IAS/Shunts: No atrial level shunt detected by color flow Doppler.  LEFT VENTRICLE PLAX 2D LVIDd:         4.18 cm      Diastology LVIDs:         3.63 cm      LV e' medial:    8.56 cm/s LV PW:         0.75 cm      LV E/e' medial:  13.8 LV IVS:        1.23 cm      LV e' lateral:   11.20 cm/s LVOT diam:     1.80 cm      LV E/e' lateral: 10.5 LV SV:         39 LV SV Index:   21           2D Longitudinal Strain LVOT Area:     2.54 cm     2D Strain GLS Avg:     -14.1 %  LV Volumes (MOD) LV vol d, MOD A2C: 127.5 ml LV vol d, MOD A4C: 97.8 ml LV vol s, MOD A2C: 84.8 ml LV vol s, MOD A4C: 54.6 ml LV SV MOD A2C:     42.7 ml LV SV MOD A4C:     97.8 ml LV SV MOD BP:      47.0 ml RIGHT VENTRICLE RV S prime:     7.51 cm/s TAPSE (M-mode): 1.2 cm LEFT ATRIUM             Index        RIGHT  ATRIUM           Index LA diam:        3.70 cm 2.00 cm/m   RA Area:     13.40 cm LA Vol (A2C):   84.3 ml 45.46 ml/m  RA Volume:   30.20 ml  16.28 ml/m LA Vol (A4C):   56.3 ml 30.36 ml/m LA Biplane Vol: 72.4 ml 39.04 ml/m  AORTIC VALVE AV Area (Vmax): 2.14 cm AV Vmax:        104.00 cm/s AV Peak Grad:   4.3 mmHg LVOT Vmax:      87.50 cm/s LVOT Vmean:     54.750 cm/s LVOT VTI:       0.153 m  AORTA Ao Root diam: 3.40 cm MITRAL VALVE                  TRICUSPID VALVE MV Area (PHT): 3.50 cm       TR Peak grad:   13.4 mmHg MV Decel Time: 217 msec       TR Vmax:        183.00 cm/s MR Peak grad:    107.1 mmHg MR Mean grad:    59.0 mmHg    SHUNTS MR Vmax:         517.50 cm/s  Systemic VTI:  0.15 m MR Vmean:        363.0 cm/s   Systemic Diam: 1.80 cm MR PISA:         0.57 cm MR PISA Eff ROA: 3 mm MR PISA Radius:  0.30 cm MV E velocity: 118.00 cm/s MV A velocity: 53.55 cm/s MV E/A ratio:  2.20 Rozann Lesches MD Electronically signed by Rozann Lesches MD Signature Date/Time: 10/28/2021/5:09:15 PM    Final    CT HEAD WO CONTRAST (5MM)  Result Date: 10/24/2021 CLINICAL DATA:  Head trauma. Status post fall. Patient is on blood thinners. EXAM: CT HEAD WITHOUT CONTRAST TECHNIQUE: Contiguous axial images were obtained from the base of the skull through the vertex without intravenous contrast. RADIATION DOSE REDUCTION: This exam was performed according to the departmental dose-optimization program which includes automated exposure control, adjustment of the mA and/or kV according to patient size and/or use of iterative reconstruction technique. COMPARISON:  09/13/2021 FINDINGS: Brain: No evidence of acute infarction, hemorrhage, hydrocephalus, extra-axial collection or mass lesion/mass effect. There is diffuse prominence of the CSF spaces overlying the cerebral and cerebellar hemispheres compatible with brain atrophy. There is mild diffuse low-attenuation within the subcortical and periventricular white matter compatible  with chronic microvascular disease. Vascular: No hyperdense vessel or unexpected calcification. Skull: Normal. Negative for fracture or focal lesion. Sinuses/Orbits: No acute finding. Other: None. IMPRESSION: 1. No acute intracranial abnormalities. 2. Chronic small vessel ischemic disease and brain atrophy. Electronically Signed   By: Kerby Moors M.D.   On: 10/24/2021 06:24     Discharge Exam: Vitals:   11/16/21 1023 11/16/21 1343  BP:  123/79  Pulse: (!) 112 72  Resp:  17  Temp:  98.2 F (36.8 C)  SpO2:  97%   Vitals:   11/16/21 0819 11/16/21 1021 11/16/21 1023 11/16/21 1343  BP:  138/83  123/79  Pulse:  (!) 49 (!) 112 72  Resp:  16  17  Temp:  98.5 F (36.9 C)  98.2 F (36.8 C)  TempSrc:  Oral  Oral  SpO2: 93% 98%  97%  Weight:      Height:        General: Pt is alert, awake, not in acute distress Cardiovascular: RRR, S1/S2 +, no rubs, no gallops Respiratory: CTA bilaterally, no wheezing, no rhonchi Abdominal: Soft, NT, ND, bowel sounds + Extremities: no edema, no cyanosis    The results of significant diagnostics from this hospitalization (including imaging, microbiology, ancillary and laboratory) are listed below for reference.     Microbiology: Recent Results (from the past 240 hour(s))  Blood Culture (routine x 2)     Status: None (Preliminary result)   Collection Time: 11/14/21  9:53 AM   Specimen: BLOOD LEFT FOREARM  Result Value Ref Range Status   Specimen Description BLOOD LEFT FOREARM  Final   Special Requests   Final    BOTTLES DRAWN AEROBIC ONLY Blood Culture results may not be optimal due to an inadequate volume of blood received in culture bottles   Culture   Final    NO GROWTH 1 DAY Performed at [facility], [address] [place], [place] [address]    Report Status PENDING  Incomplete  Urine Culture     Status: None   Collection Time: 11/14/21  9:53 AM   Specimen: Urine, Catheterized  Result Value Ref Range Status   Specimen Description    Final    URINE, CATHETERIZED Performed at [facility], [address], [place],  [place] [address]    Special Requests   Final    NONE Performed at [facility], [address], [place], [place] [address]    Culture   Final    NO GROWTH Performed at [facility], [place] [address], [place], [place] [address]    Report Status 11/15/2021 FINAL  Final  Blood Culture (routine x 2)     Status: None (Preliminary result)   Collection Time: 11/14/21  9:58 AM   Specimen: BLOOD RIGHT HAND  Result Value Ref Range Status   Specimen Description BLOOD RIGHT HAND  Final   Special Requests   Final    BOTTLES DRAWN AEROBIC AND ANAEROBIC Blood Culture adequate volume   Culture   Final    NO GROWTH 1 DAY Performed at [facility], [address] [place] [address] [place], [place] [address]    Report Status PENDING  Incomplete  Resp Panel by RT-PCR (Flu A&B, Covid) Anterior Nasal Swab     Status: None   Collection Time: 11/14/21 11:16 AM   Specimen: Anterior Nasal Swab  Result Value Ref Range Status   SARS Coronavirus 2 by RT PCR NEGATIVE NEGATIVE Final    Comment: (NOTE) SARS-CoV-2 target nucleic acids are NOT DETECTED.  The SARS-CoV-2 RNA is generally detectable in upper respiratory specimens during the acute phase of infection. The lowest concentration of SARS-CoV-2 viral copies this assay can detect is 138 copies/mL. A negative result does not preclude SARS-Cov-2 infection and should not be used as the sole basis for treatment or other patient management decisions. A negative result may occur with  improper specimen collection/handling, submission of specimen other than nasopharyngeal swab, presence of viral mutation(s) within the areas targeted by this assay, and inadequate number of viral copies(<138 copies/mL). A negative result must be combined with clinical observations, patient history, and epidemiological information. The expected result is Negative.  Fact Sheet for Patients:   EntrepreneurPulse.com.au  Fact Sheet for Healthcare Providers:  IncredibleEmployment.be  This test is no t yet approved or cleared by the [place] FDA and  has been authorized for detection and/or diagnosis of SARS-CoV-2 by FDA under an Emergency Use Authorization (EUA). This EUA will remain  in effect (meaning this test can be used) for the duration of the COVID-19 declaration under Section 564(b)(1) of the Act, 21 U.S.C.section 360bbb-3(b)(1), unless the authorization is terminated  or revoked sooner.       Influenza A by PCR NEGATIVE NEGATIVE Final   Influenza B by PCR NEGATIVE NEGATIVE Final    Comment: (NOTE) The Xpert Xpress SARS-CoV-2/FLU/RSV plus assay is intended as an aid in the diagnosis of influenza from Nasopharyngeal swab specimens and should not be used as a sole basis for treatment. Nasal washings and aspirates are unacceptable for Xpert Xpress SARS-CoV-2/FLU/RSV testing.  Fact Sheet for Patients: EntrepreneurPulse.com.au  Fact Sheet for Healthcare Providers: IncredibleEmployment.be  This test is not yet approved or cleared by the [place] FDA and has been authorized for detection and/or diagnosis of SARS-CoV-2 by FDA under an Emergency Use Authorization (EUA). This EUA will remain in effect (meaning this test can be used) for the duration of the COVID-19 declaration under Section 564(b)(1) of the Act, 21 U.S.C. section 360bbb-3(b)(1), unless the authorization is terminated or revoked.  Performed at [facility], [address], [place], [place] [address]   MRSA Next Gen by PCR, Nasal     Status: None   Collection Time: 11/14/21 11:54 AM   Specimen: Nasal Mucosa; Nasal Swab  Result Value Ref Range Status   MRSA by PCR Next Gen NOT DETECTED NOT DETECTED Final    Comment: (NOTE) The GeneXpert MRSA Assay (FDA approved for NASAL specimens only), is one component of a  comprehensive MRSA colonization surveillance program. It is not intended to diagnose MRSA infection nor to guide or monitor treatment for MRSA infections. Test performance is not FDA approved in patients less than 68 years old. Performed at [facility], [address], [place], [place] [address]      Labs: BNP (last 3 results) Recent Labs    09/13/21 1220 11/14/21 0955  BNP 232.0* 902.4*   Basic Metabolic Panel: Recent Labs  Lab 11/14/21 1008 11/15/21 0658 11/16/21 0554  NA 131* 131* 132*  K 3.5 3.4* 3.3*  CL 94* 98 100  CO2 '28 28 26  '$ GLUCOSE 137* 129* 129*  BUN '15 16 16  '$ CREATININE 1.17 1.06 1.02  CALCIUM 8.3* 7.8* 7.9*  MG  --  1.6* 1.7   Liver Function Tests: Recent Labs  Lab 11/14/21 1008  AST 15  ALT 11  ALKPHOS 79  BILITOT 1.1  PROT 6.0*  ALBUMIN 3.1*   No results for input(s): "LIPASE", "AMYLASE" in the last 168 hours. No results for input(s): "AMMONIA" in the last 168 hours. CBC: Recent Labs  Lab 11/14/21 1008 11/15/21 0658 11/16/21 0554  WBC 6.3 10.8* 9.0  NEUTROABS 5.4  --   --   HGB 13.7 11.6* 11.1*  HCT 40.5 34.1* 32.9*  MCV 89.4 89.7 90.1  PLT 182 163 171   Cardiac Enzymes: No results for input(s): "CKTOTAL", "CKMB", "CKMBINDEX", "TROPONINI" in the last 168 hours. BNP: Invalid input(s): "POCBNP" CBG: Recent Labs  Lab 11/15/21 1121 11/15/21 1735 11/15/21 2203 11/16/21 0754 11/16/21 1128  GLUCAP 208* 111* 134* 143* 208*   D-Dimer No results for input(s): "DDIMER" in the last 72 hours. Hgb A1c Recent Labs    11/14/21 1042  HGBA1C 6.6*   Lipid Profile No results for input(s): "CHOL", "HDL", "LDLCALC", "TRIG", "CHOLHDL", "LDLDIRECT" in the last 72 hours. Thyroid function studies No results for input(s): "TSH", "T4TOTAL", "T3FREE", "THYROIDAB" in the last 72 hours.  Invalid input(s): "FREET3" Anemia work up No results for input(s): "VITAMINB12", "FOLATE", "FERRITIN", "TIBC", "IRON", "RETICCTPCT" in the last 72  hours. Urinalysis    Component Value Date/Time   COLORURINE YELLOW 11/14/2021 [place] 11/14/2021 1216   LABSPEC 1.013 11/14/2021 1216   PHURINE 8.0 11/14/2021 1216   GLUCOSEU NEGATIVE 11/14/2021 1216   HGBUR NEGATIVE 11/14/2021 1216   BILIRUBINUR NEGATIVE 11/14/2021 1216   KETONESUR NEGATIVE 11/14/2021 1216   PROTEINUR NEGATIVE 11/14/2021 1216   NITRITE NEGATIVE 11/14/2021 1216   LEUKOCYTESUR NEGATIVE 11/14/2021 1216   Sepsis Labs Recent Labs  Lab 11/14/21 1008 11/15/21 0658 11/16/21 0554  WBC 6.3 10.8* 9.0   Microbiology Recent Results (from the past 240 hour(s))  Blood Culture (routine x 2)     Status: None (Preliminary result)   Collection Time: 11/14/21  9:53 AM   Specimen: BLOOD LEFT FOREARM  Result Value Ref Range Status   Specimen Description BLOOD LEFT FOREARM  Final   Special Requests   Final    BOTTLES DRAWN AEROBIC ONLY Blood Culture results may not be optimal due to an inadequate volume of blood received in culture bottles   Culture   Final    NO GROWTH 1 DAY Performed at [facility], [address] [place], [place] [address]    Report Status PENDING  Incomplete  Urine Culture     Status: None   Collection Time: 11/14/21  9:53 AM   Specimen: Urine, Catheterized  Result Value Ref Range Status   Specimen Description   Final    URINE, CATHETERIZED Performed at [facility], [address], [place], [place] [address]    Special Requests   Final    NONE Performed at [facility], [address], [place], [place] [address]    Culture   Final    NO GROWTH Performed at [facility], [place] [address], [place], [place] [address]    Report Status 11/15/2021 FINAL  Final  Blood Culture (routine x 2)     Status: None (Preliminary result)   Collection Time: 11/14/21  9:58 AM   Specimen: BLOOD RIGHT HAND  Result Value Ref Range Status   Specimen Description BLOOD RIGHT HAND  Final   Special Requests   Final    BOTTLES DRAWN AEROBIC AND  ANAEROBIC Blood Culture adequate volume   Culture   Final    NO GROWTH 1 DAY Performed at [facility], [address], [place], [place] [address]    Report Status PENDING  Incomplete  Resp Panel by RT-PCR (Flu A&B, Covid) Anterior Nasal Swab     Status: None   Collection Time: 11/14/21 11:16 AM   Specimen: Anterior Nasal Swab  Result Value Ref Range Status   SARS Coronavirus 2 by RT PCR NEGATIVE NEGATIVE Final    Comment: (NOTE) SARS-CoV-2 target nucleic acids are NOT DETECTED.  The SARS-CoV-2 RNA is generally detectable in upper respiratory specimens during the acute phase of infection. The lowest concentration of SARS-CoV-2 viral copies this assay can detect is 138 copies/mL. A negative result does not preclude SARS-Cov-2 infection and should not be used as the sole basis for treatment or other patient management decisions. A negative result may occur with  improper specimen collection/handling, submission of specimen other than nasopharyngeal swab, presence of viral mutation(s) within the areas targeted by this assay, and inadequate number of viral copies(<138 copies/mL). A negative result must be combined with clinical observations, patient history, and epidemiological information. The expected result is Negative.  Fact Sheet for Patients:  EntrepreneurPulse.com.au  Fact Sheet for Healthcare Providers:  IncredibleEmployment.be  This test is no t yet approved or cleared by the [place] FDA and  has been authorized for detection and/or diagnosis of SARS-CoV-2 by FDA under an Emergency Use Authorization (EUA). This EUA will remain  in effect (meaning this test can be used) for the duration of the COVID-19 declaration under Section 564(b)(1) of the Act, 21 U.S.C.section 360bbb-3(b)(1), unless the authorization is terminated  or revoked sooner.       Influenza A by PCR NEGATIVE NEGATIVE Final   Influenza B by PCR NEGATIVE NEGATIVE  Final    Comment: (NOTE) The Xpert Xpress SARS-CoV-2/FLU/RSV plus assay is intended as an aid in the diagnosis of influenza from Nasopharyngeal swab specimens and should not be used as a sole basis for treatment. Nasal washings and aspirates are unacceptable for Xpert Xpress SARS-CoV-2/FLU/RSV testing.  Fact Sheet for Patients: EntrepreneurPulse.com.au  Fact Sheet for Healthcare Providers: IncredibleEmployment.be  This test is not yet approved or cleared by the [place] FDA and has been authorized for detection and/or diagnosis of SARS-CoV-2 by FDA under an Emergency Use Authorization (EUA). This EUA will remain in effect (meaning this test can be used) for the duration of the COVID-19 declaration under Section 564(b)(1) of the Act, 21  U.S.C. section 360bbb-3(b)(1), unless the authorization is terminated or revoked.  Performed at [facility], [address], [place], [place] [address]   MRSA Next Gen by PCR, Nasal     Status: None   Collection Time: 11/14/21 11:54 AM   Specimen: Nasal Mucosa; Nasal Swab  Result Value Ref Range Status   MRSA by PCR Next Gen NOT DETECTED NOT DETECTED Final    Comment: (NOTE) The GeneXpert MRSA Assay (FDA approved for NASAL specimens only), is one component of a comprehensive MRSA colonization surveillance program. It is not intended to diagnose MRSA infection nor to guide or monitor treatment for MRSA infections. Test performance is not FDA approved in patients less than 74 years old. Performed at [facility], [address] [place], [place] [address]      Time coordinating discharge: 35 minutes  SIGNED:   Rodena Goldmann, DO [facility] 11/16/2021, 2:00 PM  If 7PM-7AM, please contact night-coverage [email or web address]

## 2021-11-16 NOTE — TOC Transition Note (Signed)
Transition of Care [facility]) - CM/SW Discharge Note   Patient Details  Name: LAVONE WEISEL MRN: [id number] Date of Birth: 1945-06-12  [facility] CM/SW Contact:  Boneta Lucks, RN Phone Number: 11/16/2021, 2:07 PM   Clinical Narrative:   Patient discharging home. Patient is requesting HHPT. Georgina Snell with [facility] accepted the referral. MD ordered.    Final next level of care: [place] Barriers to Discharge: Barriers Resolved   Patient Goals and CMS Choice Patient states their goals for this hospitalization and ongoing recovery are:: to go home. CMS Medicare.gov Compare Post Acute Care list provided to:: Patient Choice offered to / list presented to : Patient  Discharge Placement            Patient and family notified of of transfer: 11/16/21  Discharge Plan and Services      HH Arranged: PT [facility] Agency: [place] Date [place]: 11/16/21 Time [place] Agency Contacted: [phone]    Readmission Risk Interventions    11/16/2021    2:05 PM  Readmission Risk Prevention Plan  Transportation Screening Complete  Home Care Screening Complete  Medication Review (RN CM) Complete

## 2021-11-16 NOTE — Evaluation (Signed)
Physical Therapy Evaluation Patient Details Name: Jason Woods MRN: [id number] DOB: 09/26/1945 Today's Date: 11/16/2021  History of Present Illness  Per Dr. Earnest Conroy, "Jason Woods is a 76 y.o. male with medical history significant for anxiety/depression, recurrent falls, chronic opiate use, dyslipidemia, GERD, hypertension, type 2 diabetes, atrial flutter on Eliquis, and COPD with ongoing tobacco abuse who was brought to the ED today via EMS on account of worsening confusion as well as ongoing cough.  Unfortunately no further significant history could be obtained as there are no family members at bedside and they cannot be reached.  He was apparently treated for pneumonia about 3 months ago and was noted to have recurrent falls about 2 weeks ago.  He was recently started on Eliquis for anticoagulation given noted documentation of atrial flutter"  Clinical Impression  PT lives with his brother.  States he does not have stairs, normally uses a quad cane but has a walker. Pt is Mod I with assistive device; needs no skilled PT at this time.        Recommendations for follow up therapy are one component of a multi-disciplinary discharge planning process, led by the attending physician.  Recommendations may be updated based on patient status, additional functional criteria and insurance authorization.  Follow Up Recommendations No PT follow up      Assistance Recommended at Discharge None  Patient can return home with the following       Equipment Recommendations None recommended by PT  Recommendations for Other Services    none   Functional Status Assessment Patient has not had a recent decline in their functional status     Precautions / Restrictions Precautions Precautions: Fall Restrictions Weight Bearing Restrictions: No      Mobility  Bed Mobility Overal bed mobility: Independent                  Transfers Overall transfer level: Independent                       Ambulation/Gait Ambulation/Gait assistance: Independent Gait Distance (Feet): 400 Feet Assistive device: Rolling walker (2 wheels) Gait Pattern/deviations: WFL(Within Functional Limits)                Pertinent Vitals/Pain  None verbalized     Home Living Family/patient expects to be discharged to:: Private residence Living Arrangements: Other relatives               Home Equipment: Conservation officer, nature (2 wheels);Cane - single point;Wheelchair - manual;BSC/3in1 Additional Comments: Daugher lives near and is availble for assistance.    Prior Function                          Extremity/Trunk Assessment        Lower Extremity Assessment Lower Extremity Assessment: Overall WFL for tasks assessed       Communication      Cognition Arousal/Alertness: Awake/alert   Overall Cognitive Status: Within Functional Limits for tasks assessed                                                 Assessment/Plan    PT Assessment Patient does not need any further PT services     PT Treatment Interventions      PT Goals (Current goals  can be found in the Care Plan section)  Acute Rehab PT Goals Patient Stated Goal: to go home PT Goal Formulation: With patient Potential to Achieve Goals: Good            AM-PAC PT "6 Clicks" Mobility  Outcome Measure Help needed turning from your back to your side while in a flat bed without using bedrails?: None Help needed moving from lying on your back to sitting on the side of a flat bed without using bedrails?: None Help needed moving to and from a bed to a chair (including a wheelchair)?: None Help needed standing up from a chair using your arms (e.g., wheelchair or bedside chair)?: None Help needed to walk in hospital room?: None Help needed climbing 3-5 steps with a railing? : A Little 6 Click Score: 23    End of Session Equipment Utilized During Treatment: Gait belt Activity  Tolerance: Patient tolerated treatment well Patient left: in bed;with call bell/phone within reach Nurse Communication: Mobility status PT Visit Diagnosis: Unsteadiness on feet (R26.81)    Time: 1130-1157 PT Time Calculation (min) (ACUTE ONLY): 27 min   Charges:   PT Evaluation $PT Eval Low Complexity: 1 Low          Rayetta Humphrey, PT CLT [phone]  11/16/2021, 11:57 AM

## 2021-11-16 NOTE — Plan of Care (Signed)
  Problem: Education: Goal: Ability to describe self-care measures that may prevent or decrease complications (Diabetes Survival Skills Education) will improve 11/16/2021 0443 by Zadie Rhine, RN Outcome: Progressing 11/16/2021 0442 by Zadie Rhine, RN Outcome: Progressing Goal: Individualized Educational Video(s) Outcome: Progressing   Problem: Fluid Volume: Goal: Ability to maintain a balanced intake and output will improve 11/16/2021 0443 by Zadie Rhine, RN Outcome: Progressing 11/16/2021 0442 by Zadie Rhine, RN Outcome: Progressing

## 2021-11-16 NOTE — Plan of Care (Signed)
  Problem: Education: Goal: Ability to describe self-care measures that may prevent or decrease complications (Diabetes Survival Skills Education) will improve Outcome: Progressing   Problem: Coping: Goal: Ability to adjust to condition or change in health will improve Outcome: Progressing   Problem: Fluid Volume: Goal: Ability to maintain a balanced intake and output will improve Outcome: Progressing   

## 2021-11-16 NOTE — Progress Notes (Signed)
Pt alert  and oriented upon DC.  He was encouraged to use his walker at home. Daughter driving pt home. Jason Corona Edd Fabian

## 2021-11-17 LAB — LEGIONELLA PNEUMOPHILA SEROGP 1 UR AG: L. pneumophila Serogp 1 Ur Ag: NEGATIVE

## 2021-11-19 LAB — CULTURE, BLOOD (ROUTINE X 2)
Culture: NO GROWTH
Culture: NO GROWTH
Special Requests: ADEQUATE

## 2021-11-20 DIAGNOSIS — K219 Gastro-esophageal reflux disease without esophagitis: Secondary | ICD-10-CM | POA: Diagnosis not present

## 2021-11-20 DIAGNOSIS — I10 Essential (primary) hypertension: Secondary | ICD-10-CM | POA: Diagnosis not present

## 2021-11-21 DIAGNOSIS — I509 Heart failure, unspecified: Secondary | ICD-10-CM | POA: Diagnosis not present

## 2021-11-21 DIAGNOSIS — I088 Other rheumatic multiple valve diseases: Secondary | ICD-10-CM | POA: Diagnosis not present

## 2021-11-21 DIAGNOSIS — M19011 Primary osteoarthritis, right shoulder: Secondary | ICD-10-CM | POA: Diagnosis not present

## 2021-11-21 DIAGNOSIS — H548 Legal blindness, as defined in USA: Secondary | ICD-10-CM | POA: Diagnosis not present

## 2021-11-21 DIAGNOSIS — I4891 Unspecified atrial fibrillation: Secondary | ICD-10-CM | POA: Diagnosis not present

## 2021-11-21 DIAGNOSIS — J440 Chronic obstructive pulmonary disease with acute lower respiratory infection: Secondary | ICD-10-CM | POA: Diagnosis not present

## 2021-11-21 DIAGNOSIS — M479 Spondylosis, unspecified: Secondary | ICD-10-CM | POA: Diagnosis not present

## 2021-11-21 DIAGNOSIS — J441 Chronic obstructive pulmonary disease with (acute) exacerbation: Secondary | ICD-10-CM | POA: Diagnosis not present

## 2021-11-21 DIAGNOSIS — E7800 Pure hypercholesterolemia, unspecified: Secondary | ICD-10-CM | POA: Diagnosis not present

## 2021-11-21 DIAGNOSIS — J9601 Acute respiratory failure with hypoxia: Secondary | ICD-10-CM | POA: Diagnosis not present

## 2021-11-21 DIAGNOSIS — I484 Atypical atrial flutter: Secondary | ICD-10-CM | POA: Diagnosis not present

## 2021-11-21 DIAGNOSIS — K219 Gastro-esophageal reflux disease without esophagitis: Secondary | ICD-10-CM | POA: Diagnosis not present

## 2021-11-21 DIAGNOSIS — I110 Hypertensive heart disease with heart failure: Secondary | ICD-10-CM | POA: Diagnosis not present

## 2021-11-21 DIAGNOSIS — G9341 Metabolic encephalopathy: Secondary | ICD-10-CM | POA: Diagnosis not present

## 2021-11-21 DIAGNOSIS — M545 Low back pain, unspecified: Secondary | ICD-10-CM | POA: Diagnosis not present

## 2021-11-21 DIAGNOSIS — M19012 Primary osteoarthritis, left shoulder: Secondary | ICD-10-CM | POA: Diagnosis not present

## 2021-11-21 DIAGNOSIS — E119 Type 2 diabetes mellitus without complications: Secondary | ICD-10-CM | POA: Diagnosis not present

## 2021-11-21 DIAGNOSIS — G8929 Other chronic pain: Secondary | ICD-10-CM | POA: Diagnosis not present

## 2021-11-21 DIAGNOSIS — F32A Depression, unspecified: Secondary | ICD-10-CM | POA: Diagnosis not present

## 2021-11-21 DIAGNOSIS — M160 Bilateral primary osteoarthritis of hip: Secondary | ICD-10-CM | POA: Diagnosis not present

## 2021-11-21 DIAGNOSIS — F17210 Nicotine dependence, cigarettes, uncomplicated: Secondary | ICD-10-CM | POA: Diagnosis not present

## 2021-11-23 DIAGNOSIS — Z720 Tobacco use: Secondary | ICD-10-CM | POA: Diagnosis not present

## 2021-11-23 DIAGNOSIS — I509 Heart failure, unspecified: Secondary | ICD-10-CM | POA: Diagnosis not present

## 2021-11-23 DIAGNOSIS — E119 Type 2 diabetes mellitus without complications: Secondary | ICD-10-CM | POA: Diagnosis not present

## 2021-11-23 DIAGNOSIS — R079 Chest pain, unspecified: Secondary | ICD-10-CM | POA: Diagnosis not present

## 2021-11-23 DIAGNOSIS — R410 Disorientation, unspecified: Secondary | ICD-10-CM | POA: Diagnosis not present

## 2021-11-23 DIAGNOSIS — T424X1A Poisoning by benzodiazepines, accidental (unintentional), initial encounter: Secondary | ICD-10-CM | POA: Diagnosis not present

## 2021-11-23 DIAGNOSIS — F17210 Nicotine dependence, cigarettes, uncomplicated: Secondary | ICD-10-CM | POA: Diagnosis not present

## 2021-11-23 DIAGNOSIS — I110 Hypertensive heart disease with heart failure: Secondary | ICD-10-CM | POA: Diagnosis not present

## 2021-11-26 DIAGNOSIS — I088 Other rheumatic multiple valve diseases: Secondary | ICD-10-CM | POA: Diagnosis not present

## 2021-11-26 DIAGNOSIS — G9341 Metabolic encephalopathy: Secondary | ICD-10-CM | POA: Diagnosis not present

## 2021-11-26 DIAGNOSIS — M19012 Primary osteoarthritis, left shoulder: Secondary | ICD-10-CM | POA: Diagnosis not present

## 2021-11-26 DIAGNOSIS — E7800 Pure hypercholesterolemia, unspecified: Secondary | ICD-10-CM | POA: Diagnosis not present

## 2021-11-26 DIAGNOSIS — I484 Atypical atrial flutter: Secondary | ICD-10-CM | POA: Diagnosis not present

## 2021-11-26 DIAGNOSIS — M545 Low back pain, unspecified: Secondary | ICD-10-CM | POA: Diagnosis not present

## 2021-11-26 DIAGNOSIS — I110 Hypertensive heart disease with heart failure: Secondary | ICD-10-CM | POA: Diagnosis not present

## 2021-11-26 DIAGNOSIS — J441 Chronic obstructive pulmonary disease with (acute) exacerbation: Secondary | ICD-10-CM | POA: Diagnosis not present

## 2021-11-26 DIAGNOSIS — M479 Spondylosis, unspecified: Secondary | ICD-10-CM | POA: Diagnosis not present

## 2021-11-26 DIAGNOSIS — D6869 Other thrombophilia: Secondary | ICD-10-CM | POA: Diagnosis not present

## 2021-11-26 DIAGNOSIS — Z299 Encounter for prophylactic measures, unspecified: Secondary | ICD-10-CM | POA: Diagnosis not present

## 2021-11-26 DIAGNOSIS — M19011 Primary osteoarthritis, right shoulder: Secondary | ICD-10-CM | POA: Diagnosis not present

## 2021-11-26 DIAGNOSIS — J9601 Acute respiratory failure with hypoxia: Secondary | ICD-10-CM | POA: Diagnosis not present

## 2021-11-26 DIAGNOSIS — I509 Heart failure, unspecified: Secondary | ICD-10-CM | POA: Diagnosis not present

## 2021-11-26 DIAGNOSIS — I10 Essential (primary) hypertension: Secondary | ICD-10-CM | POA: Diagnosis not present

## 2021-11-26 DIAGNOSIS — H548 Legal blindness, as defined in USA: Secondary | ICD-10-CM | POA: Diagnosis not present

## 2021-11-26 DIAGNOSIS — J440 Chronic obstructive pulmonary disease with acute lower respiratory infection: Secondary | ICD-10-CM | POA: Diagnosis not present

## 2021-11-26 DIAGNOSIS — K219 Gastro-esophageal reflux disease without esophagitis: Secondary | ICD-10-CM | POA: Diagnosis not present

## 2021-11-26 DIAGNOSIS — G8929 Other chronic pain: Secondary | ICD-10-CM | POA: Diagnosis not present

## 2021-11-26 DIAGNOSIS — E1165 Type 2 diabetes mellitus with hyperglycemia: Secondary | ICD-10-CM | POA: Diagnosis not present

## 2021-11-26 DIAGNOSIS — I4891 Unspecified atrial fibrillation: Secondary | ICD-10-CM | POA: Diagnosis not present

## 2021-11-26 DIAGNOSIS — M160 Bilateral primary osteoarthritis of hip: Secondary | ICD-10-CM | POA: Diagnosis not present

## 2021-11-26 DIAGNOSIS — E119 Type 2 diabetes mellitus without complications: Secondary | ICD-10-CM | POA: Diagnosis not present

## 2021-11-26 DIAGNOSIS — F32A Depression, unspecified: Secondary | ICD-10-CM | POA: Diagnosis not present

## 2021-11-26 DIAGNOSIS — F17210 Nicotine dependence, cigarettes, uncomplicated: Secondary | ICD-10-CM | POA: Diagnosis not present

## 2021-11-28 DIAGNOSIS — I110 Hypertensive heart disease with heart failure: Secondary | ICD-10-CM | POA: Diagnosis not present

## 2021-11-28 DIAGNOSIS — M545 Low back pain, unspecified: Secondary | ICD-10-CM | POA: Diagnosis not present

## 2021-11-28 DIAGNOSIS — M160 Bilateral primary osteoarthritis of hip: Secondary | ICD-10-CM | POA: Diagnosis not present

## 2021-11-28 DIAGNOSIS — J441 Chronic obstructive pulmonary disease with (acute) exacerbation: Secondary | ICD-10-CM | POA: Diagnosis not present

## 2021-11-28 DIAGNOSIS — J9601 Acute respiratory failure with hypoxia: Secondary | ICD-10-CM | POA: Diagnosis not present

## 2021-11-28 DIAGNOSIS — F17210 Nicotine dependence, cigarettes, uncomplicated: Secondary | ICD-10-CM | POA: Diagnosis not present

## 2021-11-28 DIAGNOSIS — M19012 Primary osteoarthritis, left shoulder: Secondary | ICD-10-CM | POA: Diagnosis not present

## 2021-11-28 DIAGNOSIS — I509 Heart failure, unspecified: Secondary | ICD-10-CM | POA: Diagnosis not present

## 2021-11-28 DIAGNOSIS — G8929 Other chronic pain: Secondary | ICD-10-CM | POA: Diagnosis not present

## 2021-11-28 DIAGNOSIS — J440 Chronic obstructive pulmonary disease with acute lower respiratory infection: Secondary | ICD-10-CM | POA: Diagnosis not present

## 2021-11-28 DIAGNOSIS — M19011 Primary osteoarthritis, right shoulder: Secondary | ICD-10-CM | POA: Diagnosis not present

## 2021-11-28 DIAGNOSIS — F32A Depression, unspecified: Secondary | ICD-10-CM | POA: Diagnosis not present

## 2021-11-28 DIAGNOSIS — K219 Gastro-esophageal reflux disease without esophagitis: Secondary | ICD-10-CM | POA: Diagnosis not present

## 2021-11-28 DIAGNOSIS — E7800 Pure hypercholesterolemia, unspecified: Secondary | ICD-10-CM | POA: Diagnosis not present

## 2021-11-28 DIAGNOSIS — H548 Legal blindness, as defined in USA: Secondary | ICD-10-CM | POA: Diagnosis not present

## 2021-11-28 DIAGNOSIS — I4891 Unspecified atrial fibrillation: Secondary | ICD-10-CM | POA: Diagnosis not present

## 2021-11-28 DIAGNOSIS — M479 Spondylosis, unspecified: Secondary | ICD-10-CM | POA: Diagnosis not present

## 2021-11-28 DIAGNOSIS — E119 Type 2 diabetes mellitus without complications: Secondary | ICD-10-CM | POA: Diagnosis not present

## 2021-11-28 DIAGNOSIS — G9341 Metabolic encephalopathy: Secondary | ICD-10-CM | POA: Diagnosis not present

## 2021-11-28 DIAGNOSIS — I088 Other rheumatic multiple valve diseases: Secondary | ICD-10-CM | POA: Diagnosis not present

## 2021-11-28 DIAGNOSIS — I484 Atypical atrial flutter: Secondary | ICD-10-CM | POA: Diagnosis not present

## 2021-12-04 ENCOUNTER — Ambulatory Visit: Payer: Medicare Other | Admitting: Cardiology

## 2021-12-04 DIAGNOSIS — M545 Low back pain, unspecified: Secondary | ICD-10-CM | POA: Diagnosis not present

## 2021-12-04 DIAGNOSIS — E119 Type 2 diabetes mellitus without complications: Secondary | ICD-10-CM | POA: Diagnosis not present

## 2021-12-04 DIAGNOSIS — H548 Legal blindness, as defined in USA: Secondary | ICD-10-CM | POA: Diagnosis not present

## 2021-12-04 DIAGNOSIS — I484 Atypical atrial flutter: Secondary | ICD-10-CM | POA: Diagnosis not present

## 2021-12-04 DIAGNOSIS — G9341 Metabolic encephalopathy: Secondary | ICD-10-CM | POA: Diagnosis not present

## 2021-12-04 DIAGNOSIS — M479 Spondylosis, unspecified: Secondary | ICD-10-CM | POA: Diagnosis not present

## 2021-12-04 DIAGNOSIS — J9601 Acute respiratory failure with hypoxia: Secondary | ICD-10-CM | POA: Diagnosis not present

## 2021-12-04 DIAGNOSIS — I509 Heart failure, unspecified: Secondary | ICD-10-CM | POA: Diagnosis not present

## 2021-12-04 DIAGNOSIS — I088 Other rheumatic multiple valve diseases: Secondary | ICD-10-CM | POA: Diagnosis not present

## 2021-12-04 DIAGNOSIS — I4891 Unspecified atrial fibrillation: Secondary | ICD-10-CM | POA: Diagnosis not present

## 2021-12-04 DIAGNOSIS — J440 Chronic obstructive pulmonary disease with acute lower respiratory infection: Secondary | ICD-10-CM | POA: Diagnosis not present

## 2021-12-04 DIAGNOSIS — G8929 Other chronic pain: Secondary | ICD-10-CM | POA: Diagnosis not present

## 2021-12-04 DIAGNOSIS — M19011 Primary osteoarthritis, right shoulder: Secondary | ICD-10-CM | POA: Diagnosis not present

## 2021-12-04 DIAGNOSIS — K219 Gastro-esophageal reflux disease without esophagitis: Secondary | ICD-10-CM | POA: Diagnosis not present

## 2021-12-04 DIAGNOSIS — F17210 Nicotine dependence, cigarettes, uncomplicated: Secondary | ICD-10-CM | POA: Diagnosis not present

## 2021-12-04 DIAGNOSIS — M160 Bilateral primary osteoarthritis of hip: Secondary | ICD-10-CM | POA: Diagnosis not present

## 2021-12-04 DIAGNOSIS — F32A Depression, unspecified: Secondary | ICD-10-CM | POA: Diagnosis not present

## 2021-12-04 DIAGNOSIS — M19012 Primary osteoarthritis, left shoulder: Secondary | ICD-10-CM | POA: Diagnosis not present

## 2021-12-04 DIAGNOSIS — I110 Hypertensive heart disease with heart failure: Secondary | ICD-10-CM | POA: Diagnosis not present

## 2021-12-04 DIAGNOSIS — E7800 Pure hypercholesterolemia, unspecified: Secondary | ICD-10-CM | POA: Diagnosis not present

## 2021-12-04 DIAGNOSIS — J441 Chronic obstructive pulmonary disease with (acute) exacerbation: Secondary | ICD-10-CM | POA: Diagnosis not present

## 2021-12-09 DIAGNOSIS — F32A Depression, unspecified: Secondary | ICD-10-CM | POA: Diagnosis not present

## 2021-12-09 DIAGNOSIS — I110 Hypertensive heart disease with heart failure: Secondary | ICD-10-CM | POA: Diagnosis not present

## 2021-12-09 DIAGNOSIS — M19011 Primary osteoarthritis, right shoulder: Secondary | ICD-10-CM | POA: Diagnosis not present

## 2021-12-09 DIAGNOSIS — M19012 Primary osteoarthritis, left shoulder: Secondary | ICD-10-CM | POA: Diagnosis not present

## 2021-12-09 DIAGNOSIS — J440 Chronic obstructive pulmonary disease with acute lower respiratory infection: Secondary | ICD-10-CM | POA: Diagnosis not present

## 2021-12-09 DIAGNOSIS — G9341 Metabolic encephalopathy: Secondary | ICD-10-CM | POA: Diagnosis not present

## 2021-12-09 DIAGNOSIS — I4891 Unspecified atrial fibrillation: Secondary | ICD-10-CM | POA: Diagnosis not present

## 2021-12-09 DIAGNOSIS — K219 Gastro-esophageal reflux disease without esophagitis: Secondary | ICD-10-CM | POA: Diagnosis not present

## 2021-12-09 DIAGNOSIS — J9601 Acute respiratory failure with hypoxia: Secondary | ICD-10-CM | POA: Diagnosis not present

## 2021-12-09 DIAGNOSIS — I509 Heart failure, unspecified: Secondary | ICD-10-CM | POA: Diagnosis not present

## 2021-12-09 DIAGNOSIS — M545 Low back pain, unspecified: Secondary | ICD-10-CM | POA: Diagnosis not present

## 2021-12-09 DIAGNOSIS — J441 Chronic obstructive pulmonary disease with (acute) exacerbation: Secondary | ICD-10-CM | POA: Diagnosis not present

## 2021-12-09 DIAGNOSIS — H548 Legal blindness, as defined in USA: Secondary | ICD-10-CM | POA: Diagnosis not present

## 2021-12-09 DIAGNOSIS — F17210 Nicotine dependence, cigarettes, uncomplicated: Secondary | ICD-10-CM | POA: Diagnosis not present

## 2021-12-09 DIAGNOSIS — E119 Type 2 diabetes mellitus without complications: Secondary | ICD-10-CM | POA: Diagnosis not present

## 2021-12-09 DIAGNOSIS — I088 Other rheumatic multiple valve diseases: Secondary | ICD-10-CM | POA: Diagnosis not present

## 2021-12-09 DIAGNOSIS — E7800 Pure hypercholesterolemia, unspecified: Secondary | ICD-10-CM | POA: Diagnosis not present

## 2021-12-09 DIAGNOSIS — M160 Bilateral primary osteoarthritis of hip: Secondary | ICD-10-CM | POA: Diagnosis not present

## 2021-12-09 DIAGNOSIS — M479 Spondylosis, unspecified: Secondary | ICD-10-CM | POA: Diagnosis not present

## 2021-12-09 DIAGNOSIS — I484 Atypical atrial flutter: Secondary | ICD-10-CM | POA: Diagnosis not present

## 2021-12-09 DIAGNOSIS — G8929 Other chronic pain: Secondary | ICD-10-CM | POA: Diagnosis not present

## 2021-12-11 ENCOUNTER — Other Ambulatory Visit (INDEPENDENT_AMBULATORY_CARE_PROVIDER_SITE_OTHER): Payer: Self-pay

## 2021-12-11 ENCOUNTER — Telehealth (INDEPENDENT_AMBULATORY_CARE_PROVIDER_SITE_OTHER): Payer: Self-pay

## 2021-12-11 ENCOUNTER — Ambulatory Visit: Payer: Medicare Other | Attending: Cardiology | Admitting: Cardiology

## 2021-12-11 ENCOUNTER — Encounter: Payer: Self-pay | Admitting: Cardiology

## 2021-12-11 VITALS — BP 128/70 | HR 78 | Ht 67.0 in | Wt 158.2 lb

## 2021-12-11 DIAGNOSIS — J9601 Acute respiratory failure with hypoxia: Secondary | ICD-10-CM | POA: Diagnosis not present

## 2021-12-11 DIAGNOSIS — I088 Other rheumatic multiple valve diseases: Secondary | ICD-10-CM | POA: Diagnosis not present

## 2021-12-11 DIAGNOSIS — M160 Bilateral primary osteoarthritis of hip: Secondary | ICD-10-CM | POA: Diagnosis not present

## 2021-12-11 DIAGNOSIS — G9341 Metabolic encephalopathy: Secondary | ICD-10-CM | POA: Diagnosis not present

## 2021-12-11 DIAGNOSIS — I110 Hypertensive heart disease with heart failure: Secondary | ICD-10-CM | POA: Diagnosis not present

## 2021-12-11 DIAGNOSIS — I484 Atypical atrial flutter: Secondary | ICD-10-CM

## 2021-12-11 DIAGNOSIS — E119 Type 2 diabetes mellitus without complications: Secondary | ICD-10-CM | POA: Diagnosis not present

## 2021-12-11 DIAGNOSIS — I509 Heart failure, unspecified: Secondary | ICD-10-CM | POA: Diagnosis not present

## 2021-12-11 DIAGNOSIS — H548 Legal blindness, as defined in USA: Secondary | ICD-10-CM | POA: Diagnosis not present

## 2021-12-11 DIAGNOSIS — J441 Chronic obstructive pulmonary disease with (acute) exacerbation: Secondary | ICD-10-CM | POA: Diagnosis not present

## 2021-12-11 DIAGNOSIS — I10 Essential (primary) hypertension: Secondary | ICD-10-CM | POA: Diagnosis not present

## 2021-12-11 DIAGNOSIS — R1310 Dysphagia, unspecified: Secondary | ICD-10-CM

## 2021-12-11 DIAGNOSIS — E1140 Type 2 diabetes mellitus with diabetic neuropathy, unspecified: Secondary | ICD-10-CM | POA: Diagnosis not present

## 2021-12-11 DIAGNOSIS — G8929 Other chronic pain: Secondary | ICD-10-CM | POA: Diagnosis not present

## 2021-12-11 DIAGNOSIS — K219 Gastro-esophageal reflux disease without esophagitis: Secondary | ICD-10-CM

## 2021-12-11 DIAGNOSIS — F17210 Nicotine dependence, cigarettes, uncomplicated: Secondary | ICD-10-CM | POA: Diagnosis not present

## 2021-12-11 DIAGNOSIS — L609 Nail disorder, unspecified: Secondary | ICD-10-CM | POA: Diagnosis not present

## 2021-12-11 DIAGNOSIS — M545 Low back pain, unspecified: Secondary | ICD-10-CM | POA: Diagnosis not present

## 2021-12-11 DIAGNOSIS — M19011 Primary osteoarthritis, right shoulder: Secondary | ICD-10-CM | POA: Diagnosis not present

## 2021-12-11 DIAGNOSIS — M479 Spondylosis, unspecified: Secondary | ICD-10-CM | POA: Diagnosis not present

## 2021-12-11 DIAGNOSIS — M19012 Primary osteoarthritis, left shoulder: Secondary | ICD-10-CM | POA: Diagnosis not present

## 2021-12-11 DIAGNOSIS — L110 Acquired keratosis follicularis: Secondary | ICD-10-CM | POA: Diagnosis not present

## 2021-12-11 DIAGNOSIS — F32A Depression, unspecified: Secondary | ICD-10-CM | POA: Diagnosis not present

## 2021-12-11 DIAGNOSIS — Z1211 Encounter for screening for malignant neoplasm of colon: Secondary | ICD-10-CM

## 2021-12-11 DIAGNOSIS — E7800 Pure hypercholesterolemia, unspecified: Secondary | ICD-10-CM | POA: Diagnosis not present

## 2021-12-11 DIAGNOSIS — D6869 Other thrombophilia: Secondary | ICD-10-CM | POA: Diagnosis not present

## 2021-12-11 DIAGNOSIS — J440 Chronic obstructive pulmonary disease with acute lower respiratory infection: Secondary | ICD-10-CM | POA: Diagnosis not present

## 2021-12-11 DIAGNOSIS — I4891 Unspecified atrial fibrillation: Secondary | ICD-10-CM | POA: Diagnosis not present

## 2021-12-11 NOTE — Progress Notes (Signed)
Clinical Summary Jason Woods is a 76 y.o.male seen today for follow up of the following medical problems. Last seen by PA Fenton, this is our first visit together.  1.Aflutter - seen in afib/aflutter clinic - aflutter was incidental finding during an ER visit after a fall, has not been symptomatic - patient declined anticoag at the time initially, later started on eliquis '5mg'$  bid - flutter is paroxsymal, was in atypical flutter by 10/16/21 EKG. 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    Depression    GERD (gastroesophageal reflux disease)    High cholesterol    History of kidney stones    Hypertension    Legally blind    Skin cancer    "frozen off my face, arms" (01/07/2018)   Type II diabetes mellitus ([place])    Vitamin B deficiency 03/09/2017     Allergies  Allergen Reactions   Lipitor [Atorvastatin] Other (See Comments)    Aching, MS   Zocor [Simvastatin-High Dose] Other (See Comments)    MS aches   Celexa [Citalopram Hydrobromide] Other (See Comments)    Nausea,blurred vision   Crestor [Rosuvastatin]     Pain, aching muscles   Mobic [Meloxicam] Other (See Comments)    nausea   Prozac [Fluoxetine Hcl] Other (See Comments)    fatigue     Current Outpatient Medications  Medication Sig Dispense Refill   albuterol (VENTOLIN HFA) 108 (90 Base) MCG/ACT inhaler Inhale 1-2 puffs into the lungs every 6 (six) hours as needed for wheezing or shortness of breath. 1 each 0   ALPRAZolam (XANAX) 1 MG tablet Take 0.5 tablets (0.5 mg total) by mouth 3 (three) times daily. 30  tablet    apixaban (ELIQUIS) 5 MG TABS tablet Take 1 tablet (5 mg total) by mouth 2 (two) times daily. 60 tablet 3   b complex vitamins tablet Take 1 tablet by mouth daily. With C     bimatoprost (LUMIGAN) 0.01 % SOLN Place 1 drop into the right eye at bedtime.     BREO ELLIPTA 100-25 MCG/ACT AEPB Inhale 1 puff into the lungs daily.     buprenorphine (SUBUTEX) 2 MG SUBL SL tablet Place 2 mg under the tongue daily.     cetirizine (ZYRTEC) 10 MG tablet Take 10 mg by mouth daily.     Cholecalciferol 50 MCG (2000 UT) TABS Take 2,000 Units by mouth daily.     Continuous Blood Gluc Receiver (FREESTYLE LIBRE 2 READER) DEVI daily.     Continuous Blood Gluc Sensor (FREESTYLE LIBRE 2 SENSOR) MISC SMARTSIG:Via Meter Every 10 Days     cyanocobalamin 2000 MCG tablet Take 2,000 mcg by mouth daily.     docusate sodium (COLACE) 100 MG capsule Take 1 capsule (100 mg total) by mouth 2 (two) times daily. While taking narcotic pain medicine. 30 capsule 0   dorzolamide (TRUSOPT) 2 % ophthalmic solution Place 1 drop into both eyes 3 (three) times daily.   3   DULoxetine (CYMBALTA) 30 MG capsule Take 30 mg  by mouth daily.   90   fluticasone (FLONASE) 50 MCG/ACT nasal spray Place 2 sprays into both nostrils daily.      gabapentin (NEURONTIN) 400 MG capsule Take 400 mg by mouth 2 (two) times daily.     HUMALOG KWIKPEN 100 UNIT/ML KwikPen Inject 5 Units into the skin in the morning, at noon, and at bedtime.     hydrOXYzine (ATARAX) 25 MG tablet Take 25 mg by mouth at bedtime.     insulin glargine (LANTUS) 100 UNIT/ML injection Inject 0.1-0.18 mLs (10-18 Units total) into the skin See admin instructions. Per sliding  16 units in the morning and 8-10 units in the evening     ipratropium-albuterol (DUONEB) 0.5-2.5 (3) MG/3ML SOLN Take 3 mLs by nebulization every 6 (six) hours as needed for shortness of breath.     ketoconazole (NIZORAL) 2 % cream Apply 1 application topically daily.     losartan (COZAAR) 100 MG tablet Take  100 mg by mouth daily.     metFORMIN (GLUCOPHAGE) 850 MG tablet Take 850 mg by mouth 2 (two) times daily with a meal.     metoprolol succinate (TOPROL-XL) 25 MG 24 hr tablet Take 25 mg by mouth daily.     Multiple Vitamin (MULTIVITAMIN WITH MINERALS) TABS tablet Take 1 tablet by mouth daily.     oxyCODONE-acetaminophen (PERCOCET) 10-325 MG tablet Take 1 tablet by mouth 2 (two) times daily as needed for pain.     pantoprazole (PROTONIX) 40 MG tablet Take 40 mg by mouth daily.     rosuvastatin (CRESTOR) 5 MG tablet Take 5 mg by mouth once a week.     SYMBICORT 160-4.5 MCG/ACT inhaler Inhale 2 puffs into the lungs in the morning and at bedtime.     traZODone (DESYREL) 100 MG tablet Take 200 mg by mouth at bedtime as needed for sleep.     triamcinolone cream (KENALOG) 0.1 % Apply 1 application topically daily as needed (affected area(s) on skin).   5   TRULANCE 3 MG TABS Take 1 tablet by mouth daily.     No current facility-administered medications for this visit.     Past Surgical History:  Procedure Laterality Date   ANKLE FUSION Left 01/12/2018   Procedure: LEFT ANKLE REMOVAL OF EXTERNAL FIXATOR, ANKLE FUSION WITH PHOENIX NAIL;  Surgeon: [name], MD;  Location: [facility];  Service: Orthopedics;  Laterality: Left;   ANKLE HARDWARE REMOVAL Left 01/07/2018   REMOVAL OF HARDWARE, TAKE DOWN OF NONUNION, INSERTION OF ANTIBIOTC BEADS/notes 01/07/2018   BIOPSY  04/23/2017   Procedure: BIOPSY;  Surgeon: Rogene Houston, MD;  [facility];  Service: Endoscopy;;  gastric   CATARACT EXTRACTION W/ INTRAOCULAR LENS  IMPLANT, BILATERAL Bilateral    ESOPHAGEAL DILATION N/A 04/23/2017   Procedure: ESOPHAGEAL DILATION;  Surgeon: Rogene Houston, MD;  [facility];  Service: Endoscopy;  Laterality: N/A;   ESOPHAGOGASTRODUODENOSCOPY N/A 04/23/2017   Procedure: ESOPHAGOGASTRODUODENOSCOPY (EGD);  Surgeon: Rogene Houston, MD;  [facility];  Service: Endoscopy;  Laterality: N/A;   9:55-moved to 1040 per Ann   EXTERNAL FIXATION LEG Left 01/07/2018   EXTERNAL FIXATION LEG, TIBIA TO CALCANEUS/notes 01/07/2018   EXTERNAL FIXATION LEG Left 01/07/2018   Procedure: EXTERNAL FIXATION LEG, TIBIA TO CALCANEUS;  Surgeon: [name], MD;  Location: [facility];  Service: Orthopedics;  Laterality: Left;   EXTERNAL FIXATION REMOVAL Left 01/12/2018   Procedure: REMOVAL EXTERNAL FIXATION ANKLE;  Surgeon: [name],  MD;  Location: [facility];  Service: Orthopedics;  Laterality: Left;   FRACTURE SURGERY     HARDWARE REMOVAL Left 01/07/2018   Procedure: LEFT ANKLE REMOVAL OF HARDWARE, TAKE DOWN OF NONUNION, INSERTION OF ANTIBIOTC BEADS;  Surgeon: [name], MD;  Location: [facility];  Service: Orthopedics;  Laterality: Left;   HARDWARE REMOVAL Left 12/01/2018   Procedure: Left calcaneus, talus, and tibia removal of deep implants; irrigation and excisional debridement;  Surgeon: Wylene Simmer, MD;  Location: [facility];  Service: Orthopedics;  Laterality: Left;   I & D EXTREMITY Left 10/05/2017   Procedure: IRRIGATION AND DEBRIDEMENT QPEN ANKLE FRACTURE;  Surgeon: [name], MD;  Location: [facility];  Service: Orthopedics;  Laterality: Left;   INNER EAR SURGERY Right 1962   "scraped the bone; couldn't hear out of it; was infected"   MULTIPLE TOOTH EXTRACTIONS  2018   ORIF ANKLE FRACTURE Left 10/05/2017   Procedure: OPEN REDUCTION INTERNAL FIXATION TRIMALLEOLAR (ORIF)ANKLE FRACTURE;  Surgeon: [name], MD;  Location: [facility];  Service: Orthopedics;  Laterality: Left;   WRIST FRACTURE SURGERY Left 2018     Allergies  Allergen Reactions   Lipitor [Atorvastatin] Other (See Comments)    Aching, MS   Zocor [Simvastatin-High Dose] Other (See Comments)    MS aches   Celexa [Citalopram Hydrobromide] Other (See Comments)    Nausea,blurred vision   Crestor [Rosuvastatin]     Pain, aching muscles   Mobic [Meloxicam] Other (See Comments)    nausea   Prozac [Fluoxetine Hcl] Other (See Comments)     fatigue      Family History  Problem Relation Age of Onset   Diabetes Mother    Heart attack Father    Diabetes Sister    Diabetes Brother      Social History Mr. Koppen reports that he has been smoking cigarettes. He has a 62.00 pack-year smoking history. He has been exposed to tobacco smoke. He has never used smokeless tobacco. Mr. Luzier reports that he does not currently use alcohol.   Review of Systems CONSTITUTIONAL: No weight loss, fever, chills, weakness or fatigue.  HEENT: Eyes: No visual loss, blurred vision, double vision or yellow sclerae.No hearing loss, sneezing, congestion, runny nose or sore throat.  SKIN: No rash or itching.  CARDIOVASCULAR: per hpi RESPIRATORY: No shortness of breath, cough or sputum.  GASTROINTESTINAL: No anorexia, nausea, vomiting or diarrhea. No abdominal pain or blood.  GENITOURINARY: No burning on urination, no polyuria NEUROLOGICAL: No headache, dizziness, syncope, paralysis, ataxia, numbness or tingling in the extremities. No change in bowel or bladder control.  MUSCULOSKELETAL: No muscle, back pain, joint pain or stiffness.  LYMPHATICS: No enlarged nodes. No history of splenectomy.  PSYCHIATRIC: No history of depression or anxiety.  ENDOCRINOLOGIC: No reports of sweating, cold or heat intolerance. No polyuria or polydipsia.  [name]   Physical Examination Vitals:   12/11/21 1058  BP: (!) 142/78  Pulse: 78  SpO2: 94%   Filed Weights   12/11/21 1058  Weight: 158 lb 3.2 oz (71.8 kg)    Gen: resting comfortably, no acute distress HEENT: no scleral icterus, pupils equal round and reactive, no palptable cervical adenopathy,  CV: RRR, no mr/g no jvd Resp: Clear to auscultation bilaterally GI: abdomen is soft, non-tender, non-distended, normal bowel sounds, no hepatosplenomegaly MSK: extremities are warm, no edema.  Skin: warm, no rash Neuro:  no focal deficits Psych: appropriate affect   Diagnostic Studies 10/2021  echo IMPRESSIONS  1. Left ventricular ejection fraction, by estimation, is 50 to 55%. The  left ventricle has low normal function. The left ventricle demonstrates  global hypokinesis. There is mild asymmetric left ventricular hypertrophy  of the septal segment. Left  ventricular diastolic parameters are indeterminate. The average left  ventricular global longitudinal strain is -14.1 %. The global longitudinal  strain is abnormal.   2. RV-RA gradient normal at 13 mmHg suggesting normal RVSP in the setting  of normal CVP. Right ventricular systolic function is normal. The right  ventricular size is normal.   3. Left atrial size was upper normal.   4. The mitral valve is degenerative. Mild mitral valve regurgitation.   5. The aortic valve is tricuspid. There is mild calcification of the  aortic valve. Aortic valve regurgitation is not visualized. Aortic valve  sclerosis/calcification is present, without any evidence of aortic  stenosis.   6. Unable to estimate CVP.    Assessment and Plan  Paroxysmal aflutter/acquired thrombophilia -has not been symptomatic at any point - flutter is paroxysmal, in SR today by EKG - continue toprol, continue eliquis for stroke prevention  2. HTN -at goal by manual recheck, continue current meds  3. Preop evaluation - ok for endoscopy, can hold eliquis 48 hours prior and resume day after.         Arnoldo Lenis, M.D.

## 2021-12-11 NOTE — Patient Instructions (Signed)
Medication Instructions:  Your physician recommends that you continue on your current medications as directed. Please refer to the Current Medication list given to you today.   Labwork: None  Testing/Procedures: None  Follow-Up: Follow up with Dr. Branch in 6 months.   Any Other Special Instructions Will Be Listed Below (If Applicable).     If you need a refill on your cardiac medications before your next appointment, please call your pharmacy.  

## 2021-12-11 NOTE — Telephone Encounter (Signed)
I have left Mr Kirksey a voicemail to please return my call to schedule

## 2021-12-11 NOTE — Telephone Encounter (Signed)
-----   Message from Gabriel Rung, NP sent at 12/11/2021  2:28 PM EDT ----- Both EGD and Colonoscopy please! Thanks!  ----- Message ----- From: Margaree Mackintosh, CMA Sent: 12/11/2021   1:13 PM EDT To: Gabriel Rung, NP  Burt Knack looking back on his last office visit he was supposed to schedule Tcs/Egd both is it for Egd or both? Thanks  ----- Message ----- From: Gabriel Rung, NP Sent: 12/11/2021  12:58 PM EDT To: Margaree Mackintosh, CMA; #  FYI  ----- Message ----- From: Arnoldo Lenis, MD Sent: 12/11/2021  11:29 AM EDT To: Gabriel Rung, NP  Ok for endscopy, can hold eliquis 48 hrs prior and resume day after    Zandra Abts MD

## 2021-12-11 NOTE — Telephone Encounter (Signed)
Mr Pinheiro is scheduled for a tcs/egd w/[facility] on Tuesday 02/10/22 and he is aware

## 2021-12-12 ENCOUNTER — Other Ambulatory Visit (INDEPENDENT_AMBULATORY_CARE_PROVIDER_SITE_OTHER): Payer: Self-pay

## 2021-12-12 ENCOUNTER — Encounter (INDEPENDENT_AMBULATORY_CARE_PROVIDER_SITE_OTHER): Payer: Self-pay

## 2021-12-12 ENCOUNTER — Telehealth (INDEPENDENT_AMBULATORY_CARE_PROVIDER_SITE_OTHER): Payer: Self-pay

## 2021-12-12 MED ORDER — PEG 3350-KCL-NA BICARB-NACL 420 G PO SOLR: 4000.0000 mL | ORAL | 0 refills | Status: AC

## 2021-12-12 NOTE — Telephone Encounter (Signed)
Jason Woods Jason Woods, CMA  ?

## 2021-12-14 DIAGNOSIS — S82892B Other fracture of left lower leg, initial encounter for open fracture type I or II: Secondary | ICD-10-CM | POA: Diagnosis not present

## 2021-12-14 DIAGNOSIS — E876 Hypokalemia: Secondary | ICD-10-CM | POA: Diagnosis not present

## 2021-12-14 DIAGNOSIS — G9341 Metabolic encephalopathy: Secondary | ICD-10-CM | POA: Diagnosis not present

## 2021-12-14 DIAGNOSIS — R531 Weakness: Secondary | ICD-10-CM | POA: Diagnosis not present

## 2021-12-18 DIAGNOSIS — Z79891 Long term (current) use of opiate analgesic: Secondary | ICD-10-CM | POA: Diagnosis not present

## 2021-12-18 DIAGNOSIS — M25532 Pain in left wrist: Secondary | ICD-10-CM | POA: Diagnosis not present

## 2021-12-18 DIAGNOSIS — M79662 Pain in left lower leg: Secondary | ICD-10-CM | POA: Diagnosis not present

## 2021-12-18 DIAGNOSIS — M25559 Pain in unspecified hip: Secondary | ICD-10-CM | POA: Diagnosis not present

## 2021-12-18 DIAGNOSIS — M545 Low back pain, unspecified: Secondary | ICD-10-CM | POA: Diagnosis not present

## 2021-12-18 DIAGNOSIS — M5010 Cervical disc disorder with radiculopathy, unspecified cervical region: Secondary | ICD-10-CM | POA: Diagnosis not present

## 2022-01-05 ENCOUNTER — Ambulatory Visit (INDEPENDENT_AMBULATORY_CARE_PROVIDER_SITE_OTHER): Payer: Medicare Other | Admitting: Gastroenterology

## 2022-01-05 ENCOUNTER — Encounter (INDEPENDENT_AMBULATORY_CARE_PROVIDER_SITE_OTHER): Payer: Self-pay | Admitting: Gastroenterology

## 2022-01-06 DIAGNOSIS — L570 Actinic keratosis: Secondary | ICD-10-CM | POA: Diagnosis not present

## 2022-01-13 DIAGNOSIS — S82892B Other fracture of left lower leg, initial encounter for open fracture type I or II: Secondary | ICD-10-CM | POA: Diagnosis not present

## 2022-01-13 DIAGNOSIS — E876 Hypokalemia: Secondary | ICD-10-CM | POA: Diagnosis not present

## 2022-01-13 DIAGNOSIS — R531 Weakness: Secondary | ICD-10-CM | POA: Diagnosis not present

## 2022-01-13 DIAGNOSIS — G9341 Metabolic encephalopathy: Secondary | ICD-10-CM | POA: Diagnosis not present

## 2022-01-18 ENCOUNTER — Other Ambulatory Visit (HOSPITAL_COMMUNITY): Payer: Self-pay | Admitting: Physician Assistant

## 2022-01-19 DIAGNOSIS — M19072 Primary osteoarthritis, left ankle and foot: Secondary | ICD-10-CM | POA: Diagnosis not present

## 2022-01-19 DIAGNOSIS — M14671 Charcot's joint, right ankle and foot: Secondary | ICD-10-CM | POA: Diagnosis not present

## 2022-01-19 DIAGNOSIS — M25562 Pain in left knee: Secondary | ICD-10-CM | POA: Diagnosis not present

## 2022-01-19 DIAGNOSIS — M79605 Pain in left leg: Secondary | ICD-10-CM | POA: Diagnosis not present

## 2022-01-19 NOTE — Telephone Encounter (Signed)
Prescription refill request for Eliquis received. Indication: A Flutter Last office visit: 12/11/21  Zandra Abts MD Scr: 1.24 on 11/23/21 Age: 76 Weight: 71.8kg  Based on above findings Eliquis '5mg'$  twice daily is the appropriate dose.  Refill approved.

## 2022-01-28 DIAGNOSIS — J9611 Chronic respiratory failure with hypoxia: Secondary | ICD-10-CM | POA: Diagnosis not present

## 2022-01-28 DIAGNOSIS — I700 Atherosclerosis of aorta: Secondary | ICD-10-CM | POA: Diagnosis not present

## 2022-01-28 DIAGNOSIS — Z299 Encounter for prophylactic measures, unspecified: Secondary | ICD-10-CM | POA: Diagnosis not present

## 2022-01-28 DIAGNOSIS — J449 Chronic obstructive pulmonary disease, unspecified: Secondary | ICD-10-CM | POA: Diagnosis not present

## 2022-01-28 DIAGNOSIS — Z23 Encounter for immunization: Secondary | ICD-10-CM | POA: Diagnosis not present

## 2022-01-28 DIAGNOSIS — I10 Essential (primary) hypertension: Secondary | ICD-10-CM | POA: Diagnosis not present

## 2022-02-03 ENCOUNTER — Telehealth (INDEPENDENT_AMBULATORY_CARE_PROVIDER_SITE_OTHER): Payer: Self-pay | Admitting: *Deleted

## 2022-02-03 NOTE — Telephone Encounter (Signed)
Noted. I have sent endo a message to cancel for now.

## 2022-02-03 NOTE — Telephone Encounter (Signed)
Patient left voicemail. Patient has covid and needs to cancel egd and tcs that is scheduled for 21st. Will call back to reschedule when feeling better.   [phone]

## 2022-02-04 ENCOUNTER — Encounter (HOSPITAL_COMMUNITY): Admission: RE | Admit: 2022-02-04 | Payer: Medicare Other | Source: Ambulatory Visit

## 2022-02-10 ENCOUNTER — Ambulatory Visit (HOSPITAL_COMMUNITY): Admission: RE | Admit: 2022-02-10 | Payer: Medicare Other | Source: Home / Self Care | Admitting: Gastroenterology

## 2022-02-10 ENCOUNTER — Encounter (HOSPITAL_COMMUNITY): Admission: RE | Payer: Self-pay | Source: Home / Self Care

## 2022-02-10 SURGERY — COLONOSCOPY WITH PROPOFOL
Anesthesia: Monitor Anesthesia Care

## 2022-02-13 DIAGNOSIS — G9341 Metabolic encephalopathy: Secondary | ICD-10-CM | POA: Diagnosis not present

## 2022-02-13 DIAGNOSIS — E876 Hypokalemia: Secondary | ICD-10-CM | POA: Diagnosis not present

## 2022-02-13 DIAGNOSIS — S82892B Other fracture of left lower leg, initial encounter for open fracture type I or II: Secondary | ICD-10-CM | POA: Diagnosis not present

## 2022-02-13 DIAGNOSIS — R531 Weakness: Secondary | ICD-10-CM | POA: Diagnosis not present

## 2022-02-17 DIAGNOSIS — Z794 Long term (current) use of insulin: Secondary | ICD-10-CM | POA: Diagnosis not present

## 2022-02-17 DIAGNOSIS — E113513 Type 2 diabetes mellitus with proliferative diabetic retinopathy with macular edema, bilateral: Secondary | ICD-10-CM | POA: Diagnosis not present

## 2022-02-17 DIAGNOSIS — Z961 Presence of intraocular lens: Secondary | ICD-10-CM | POA: Diagnosis not present

## 2022-02-17 DIAGNOSIS — D3132 Benign neoplasm of left choroid: Secondary | ICD-10-CM | POA: Diagnosis not present

## 2022-02-17 DIAGNOSIS — Z7984 Long term (current) use of oral hypoglycemic drugs: Secondary | ICD-10-CM | POA: Diagnosis not present

## 2022-02-17 DIAGNOSIS — H40001 Preglaucoma, unspecified, right eye: Secondary | ICD-10-CM | POA: Diagnosis not present

## 2022-02-18 DIAGNOSIS — Z0000 Encounter for general adult medical examination without abnormal findings: Secondary | ICD-10-CM | POA: Diagnosis not present

## 2022-02-18 DIAGNOSIS — E11610 Type 2 diabetes mellitus with diabetic neuropathic arthropathy: Secondary | ICD-10-CM | POA: Diagnosis not present

## 2022-02-18 DIAGNOSIS — I10 Essential (primary) hypertension: Secondary | ICD-10-CM | POA: Diagnosis not present

## 2022-02-18 DIAGNOSIS — R5383 Other fatigue: Secondary | ICD-10-CM | POA: Diagnosis not present

## 2022-02-18 DIAGNOSIS — Z79899 Other long term (current) drug therapy: Secondary | ICD-10-CM | POA: Diagnosis not present

## 2022-02-18 DIAGNOSIS — F17210 Nicotine dependence, cigarettes, uncomplicated: Secondary | ICD-10-CM | POA: Diagnosis not present

## 2022-02-18 DIAGNOSIS — E1142 Type 2 diabetes mellitus with diabetic polyneuropathy: Secondary | ICD-10-CM | POA: Diagnosis not present

## 2022-02-18 DIAGNOSIS — Z299 Encounter for prophylactic measures, unspecified: Secondary | ICD-10-CM | POA: Diagnosis not present

## 2022-02-18 DIAGNOSIS — E7800 Pure hypercholesterolemia, unspecified: Secondary | ICD-10-CM | POA: Diagnosis not present

## 2022-03-02 DIAGNOSIS — Z79891 Long term (current) use of opiate analgesic: Secondary | ICD-10-CM | POA: Diagnosis not present

## 2022-03-02 DIAGNOSIS — M25532 Pain in left wrist: Secondary | ICD-10-CM | POA: Diagnosis not present

## 2022-03-02 DIAGNOSIS — M79662 Pain in left lower leg: Secondary | ICD-10-CM | POA: Diagnosis not present

## 2022-03-02 DIAGNOSIS — M545 Low back pain, unspecified: Secondary | ICD-10-CM | POA: Diagnosis not present

## 2022-03-02 DIAGNOSIS — M5010 Cervical disc disorder with radiculopathy, unspecified cervical region: Secondary | ICD-10-CM | POA: Diagnosis not present

## 2022-03-09 DIAGNOSIS — E1165 Type 2 diabetes mellitus with hyperglycemia: Secondary | ICD-10-CM | POA: Diagnosis not present

## 2022-03-09 DIAGNOSIS — J069 Acute upper respiratory infection, unspecified: Secondary | ICD-10-CM | POA: Diagnosis not present

## 2022-03-09 DIAGNOSIS — F17210 Nicotine dependence, cigarettes, uncomplicated: Secondary | ICD-10-CM | POA: Diagnosis not present

## 2022-03-09 DIAGNOSIS — Z299 Encounter for prophylactic measures, unspecified: Secondary | ICD-10-CM | POA: Diagnosis not present

## 2022-03-09 DIAGNOSIS — I10 Essential (primary) hypertension: Secondary | ICD-10-CM | POA: Diagnosis not present

## 2022-03-15 DIAGNOSIS — G9341 Metabolic encephalopathy: Secondary | ICD-10-CM | POA: Diagnosis not present

## 2022-03-15 DIAGNOSIS — E876 Hypokalemia: Secondary | ICD-10-CM | POA: Diagnosis not present

## 2022-03-15 DIAGNOSIS — S82892B Other fracture of left lower leg, initial encounter for open fracture type I or II: Secondary | ICD-10-CM | POA: Diagnosis not present

## 2022-03-15 DIAGNOSIS — R531 Weakness: Secondary | ICD-10-CM | POA: Diagnosis not present

## 2022-03-31 DIAGNOSIS — Z7189 Other specified counseling: Secondary | ICD-10-CM | POA: Diagnosis not present

## 2022-03-31 DIAGNOSIS — Z1389 Encounter for screening for other disorder: Secondary | ICD-10-CM | POA: Diagnosis not present

## 2022-03-31 DIAGNOSIS — Z136 Encounter for screening for cardiovascular disorders: Secondary | ICD-10-CM | POA: Diagnosis not present

## 2022-03-31 DIAGNOSIS — Z299 Encounter for prophylactic measures, unspecified: Secondary | ICD-10-CM | POA: Diagnosis not present

## 2022-03-31 DIAGNOSIS — Z0000 Encounter for general adult medical examination without abnormal findings: Secondary | ICD-10-CM | POA: Diagnosis not present

## 2022-03-31 DIAGNOSIS — I700 Atherosclerosis of aorta: Secondary | ICD-10-CM | POA: Diagnosis not present

## 2022-03-31 DIAGNOSIS — I10 Essential (primary) hypertension: Secondary | ICD-10-CM | POA: Diagnosis not present

## 2022-03-31 DIAGNOSIS — F17210 Nicotine dependence, cigarettes, uncomplicated: Secondary | ICD-10-CM | POA: Diagnosis not present

## 2022-04-09 DIAGNOSIS — L110 Acquired keratosis follicularis: Secondary | ICD-10-CM | POA: Diagnosis not present

## 2022-04-09 DIAGNOSIS — E1140 Type 2 diabetes mellitus with diabetic neuropathy, unspecified: Secondary | ICD-10-CM | POA: Diagnosis not present

## 2022-04-09 DIAGNOSIS — L609 Nail disorder, unspecified: Secondary | ICD-10-CM | POA: Diagnosis not present

## 2022-04-15 DIAGNOSIS — S82892B Other fracture of left lower leg, initial encounter for open fracture type I or II: Secondary | ICD-10-CM | POA: Diagnosis not present

## 2022-04-15 DIAGNOSIS — R531 Weakness: Secondary | ICD-10-CM | POA: Diagnosis not present

## 2022-04-15 DIAGNOSIS — G9341 Metabolic encephalopathy: Secondary | ICD-10-CM | POA: Diagnosis not present

## 2022-04-15 DIAGNOSIS — E876 Hypokalemia: Secondary | ICD-10-CM | POA: Diagnosis not present

## 2022-04-27 DIAGNOSIS — E1141 Type 2 diabetes mellitus with diabetic mononeuropathy: Secondary | ICD-10-CM | POA: Diagnosis not present

## 2022-05-06 DIAGNOSIS — R55 Syncope and collapse: Secondary | ICD-10-CM | POA: Diagnosis not present

## 2022-05-06 DIAGNOSIS — G8929 Other chronic pain: Secondary | ICD-10-CM | POA: Diagnosis not present

## 2022-05-06 DIAGNOSIS — I951 Orthostatic hypotension: Secondary | ICD-10-CM | POA: Diagnosis not present

## 2022-05-06 DIAGNOSIS — E861 Hypovolemia: Secondary | ICD-10-CM | POA: Diagnosis not present

## 2022-05-06 DIAGNOSIS — E785 Hyperlipidemia, unspecified: Secondary | ICD-10-CM | POA: Diagnosis not present

## 2022-05-06 DIAGNOSIS — E871 Hypo-osmolality and hyponatremia: Secondary | ICD-10-CM | POA: Diagnosis not present

## 2022-05-06 DIAGNOSIS — E7800 Pure hypercholesterolemia, unspecified: Secondary | ICD-10-CM | POA: Diagnosis not present

## 2022-05-06 DIAGNOSIS — G9340 Encephalopathy, unspecified: Secondary | ICD-10-CM | POA: Diagnosis not present

## 2022-05-06 DIAGNOSIS — Z7982 Long term (current) use of aspirin: Secondary | ICD-10-CM | POA: Diagnosis not present

## 2022-05-06 DIAGNOSIS — Z79899 Other long term (current) drug therapy: Secondary | ICD-10-CM | POA: Diagnosis not present

## 2022-05-06 DIAGNOSIS — E11649 Type 2 diabetes mellitus with hypoglycemia without coma: Secondary | ICD-10-CM | POA: Diagnosis not present

## 2022-05-06 DIAGNOSIS — I959 Hypotension, unspecified: Secondary | ICD-10-CM | POA: Diagnosis not present

## 2022-05-06 DIAGNOSIS — I10 Essential (primary) hypertension: Secondary | ICD-10-CM | POA: Diagnosis not present

## 2022-05-06 DIAGNOSIS — E876 Hypokalemia: Secondary | ICD-10-CM | POA: Diagnosis not present

## 2022-05-06 DIAGNOSIS — G9341 Metabolic encephalopathy: Secondary | ICD-10-CM | POA: Diagnosis not present

## 2022-05-06 DIAGNOSIS — F17210 Nicotine dependence, cigarettes, uncomplicated: Secondary | ICD-10-CM | POA: Diagnosis not present

## 2022-05-06 DIAGNOSIS — E162 Hypoglycemia, unspecified: Secondary | ICD-10-CM | POA: Diagnosis not present

## 2022-05-06 DIAGNOSIS — E860 Dehydration: Secondary | ICD-10-CM | POA: Diagnosis not present

## 2022-05-06 DIAGNOSIS — K219 Gastro-esophageal reflux disease without esophagitis: Secondary | ICD-10-CM | POA: Diagnosis not present

## 2022-05-06 DIAGNOSIS — J449 Chronic obstructive pulmonary disease, unspecified: Secondary | ICD-10-CM | POA: Diagnosis not present

## 2022-05-06 DIAGNOSIS — R112 Nausea with vomiting, unspecified: Secondary | ICD-10-CM | POA: Diagnosis not present

## 2022-05-06 DIAGNOSIS — E869 Volume depletion, unspecified: Secondary | ICD-10-CM | POA: Diagnosis not present

## 2022-05-06 DIAGNOSIS — R0902 Hypoxemia: Secondary | ICD-10-CM | POA: Diagnosis not present

## 2022-05-06 DIAGNOSIS — Z794 Long term (current) use of insulin: Secondary | ICD-10-CM | POA: Diagnosis not present

## 2022-05-10 ENCOUNTER — Encounter (HOSPITAL_COMMUNITY): Payer: Self-pay

## 2022-05-10 ENCOUNTER — Emergency Department (HOSPITAL_COMMUNITY)
Admission: EM | Admit: 2022-05-10 | Discharge: 2022-05-10 | Disposition: A | Discharge status: Home / Self Care | Payer: 59 | Attending: Emergency Medicine | Admitting: Emergency Medicine

## 2022-05-10 ENCOUNTER — Emergency Department (HOSPITAL_COMMUNITY): Payer: 59

## 2022-05-10 ENCOUNTER — Other Ambulatory Visit: Payer: Self-pay

## 2022-05-10 DIAGNOSIS — Z7984 Long term (current) use of oral hypoglycemic drugs: Secondary | ICD-10-CM | POA: Insufficient documentation

## 2022-05-10 DIAGNOSIS — S300XXA Contusion of lower back and pelvis, initial encounter: Secondary | ICD-10-CM | POA: Insufficient documentation

## 2022-05-10 DIAGNOSIS — W01198A Fall on same level from slipping, tripping and stumbling with subsequent striking against other object, initial encounter: Secondary | ICD-10-CM | POA: Insufficient documentation

## 2022-05-10 DIAGNOSIS — Z7901 Long term (current) use of anticoagulants: Secondary | ICD-10-CM | POA: Diagnosis not present

## 2022-05-10 DIAGNOSIS — Y92000 Kitchen of unspecified non-institutional (private) residence as  the place of occurrence of the external cause: Secondary | ICD-10-CM | POA: Diagnosis not present

## 2022-05-10 DIAGNOSIS — S0990XA Unspecified injury of head, initial encounter: Secondary | ICD-10-CM

## 2022-05-10 DIAGNOSIS — E119 Type 2 diabetes mellitus without complications: Secondary | ICD-10-CM | POA: Insufficient documentation

## 2022-05-10 DIAGNOSIS — Z794 Long term (current) use of insulin: Secondary | ICD-10-CM | POA: Insufficient documentation

## 2022-05-10 DIAGNOSIS — S0003XA Contusion of scalp, initial encounter: Secondary | ICD-10-CM | POA: Insufficient documentation

## 2022-05-10 DIAGNOSIS — S3992XA Unspecified injury of lower back, initial encounter: Secondary | ICD-10-CM | POA: Diagnosis not present

## 2022-05-10 DIAGNOSIS — M47812 Spondylosis without myelopathy or radiculopathy, cervical region: Secondary | ICD-10-CM | POA: Diagnosis not present

## 2022-05-10 LAB — CBG MONITORING, ED: Glucose-Capillary: 181 mg/dL — ABNORMAL HIGH (ref 70–99)

## 2022-05-10 MED ORDER — HYDROCODONE-ACETAMINOPHEN 5-325 MG PO TABS
1.0000 | ORAL_TABLET | Freq: Once | ORAL | Status: AC
  Administered 2022-05-10: 1 via ORAL
  Filled 2022-05-10: qty 1

## 2022-05-10 NOTE — ED Triage Notes (Signed)
Putting away some groceries, stepped on pajamas, struck his left hip and low back

## 2022-05-10 NOTE — Discharge Instructions (Signed)
You are seen today after hitting your head and your tailbone.  Your CAT scan of your head and neck showed no injuries.  The x-rays of your tailbone were normal as well.  Make sure you are sitting on pillows and cushioning your tailbone to help with pain relief and healing.  Follow-up closely with your primary care doctor, come back to the ER for any new or worsening symptoms.

## 2022-05-10 NOTE — ED Provider Notes (Signed)
[facility] Provider Note   CSN: [id number] Arrival date & time: 05/10/22  1448     History  Chief Complaint  Patient presents with   Fall    Jason Woods is a 77 y.o. male.  History of diabetes and atrial flutter on Eliquis for anticoagulation.  Presents the ED today after fall with head injury for evaluation.  He states he was in his kitchen and accidentally slipped falling directly backwards onto his tailbone and striking the back of his head as well.  He was a mild headache 4 out of 10 on the pain scale and 8 out of 10 pain in his coccyx.  Denies hip or leg pain.  No loss consciousness.  No vision changes no other complaints.  He denies syncope or dizziness.   Fall       Home Medications Prior to Admission medications   Medication Sig Start Date End Date Taking? Authorizing Provider  albuterol (VENTOLIN HFA) 108 (90 Base) MCG/ACT inhaler Inhale 1-2 puffs into the lungs every 6 (six) hours as needed for wheezing or shortness of breath. 07/15/21   Smoot, Sarah A, PA-C  ALPRAZolam Duanne Moron) 1 MG tablet Take 0.5 tablets (0.5 mg total) by mouth 3 (three) times daily. 09/15/20   Johnson, Clanford L, MD  apixaban (ELIQUIS) 5 MG TABS tablet TAKE 1 TABLET BY MOUTH TWICE DAILY 01/19/22   Arnoldo Lenis, MD  b complex vitamins tablet Take 1 tablet by mouth daily. With C    [provider]  bimatoprost (LUMIGAN) 0.01 % SOLN Place 1 drop into the right eye at bedtime.    [provider]  BREO ELLIPTA 100-25 MCG/ACT AEPB Inhale 1 puff into the lungs daily. 10/31/21   [provider]  buprenorphine (SUBUTEX) 2 MG SUBL SL tablet Place 2 mg under the tongue daily. 09/12/21   [provider]  cetirizine (ZYRTEC) 10 MG tablet Take 10 mg by mouth daily. 09/06/21   [provider]  Cholecalciferol 50 MCG (2000 UT) TABS Take 2,000 Units by mouth daily.    [provider]  Continuous Blood Gluc  Receiver (FREESTYLE LIBRE 2 READER) DEVI daily. 09/30/21   [provider]  Continuous Blood Gluc Sensor (FREESTYLE LIBRE 2 SENSOR) MISC SMARTSIG:Via Meter Every 10 Days 09/29/21   [provider]  cyanocobalamin 2000 MCG tablet Take 2,000 mcg by mouth daily.    [provider]  docusate sodium (COLACE) 100 MG capsule Take 1 capsule (100 mg total) by mouth 2 (two) times daily. While taking narcotic pain medicine. 12/06/18   Corky Sing, PA-C  dorzolamide (TRUSOPT) 2 % ophthalmic solution Place 1 drop into both eyes 3 (three) times daily.  04/15/17   [provider]  DULoxetine (CYMBALTA) 30 MG capsule Take 30 mg by mouth daily.  10/11/17   [provider]  fluticasone (FLONASE) 50 MCG/ACT nasal spray Place 2 sprays into both nostrils daily.     [provider]  gabapentin (NEURONTIN) 400 MG capsule Take 400 mg by mouth 2 (two) times daily. 09/06/21   [provider]  HUMALOG KWIKPEN 100 UNIT/ML KwikPen Inject 5 Units into the skin in the morning, at noon, and at bedtime. 09/06/21   [provider]  hydrOXYzine (ATARAX) 25 MG tablet Take 25 mg by mouth at bedtime. 09/06/21   [provider]  insulin glargine (LANTUS) 100 UNIT/ML injection Inject 0.1-0.18 mLs (10-18 Units total) into the skin See admin  instructions. Per sliding  16 units in the morning and 8-10 units in the evening 09/15/20   Johnson, Clanford L, MD  ipratropium-albuterol (DUONEB) 0.5-2.5 (3) MG/3ML SOLN Take 3 mLs by nebulization every 6 (six) hours as needed for shortness of breath. 09/03/21   [provider]  ketoconazole (NIZORAL) 2 % cream Apply 1 application topically daily.    [provider]  losartan (COZAAR) 100 MG tablet Take 100 mg by mouth daily. 09/06/21   [provider]  metFORMIN (GLUCOPHAGE) 850 MG tablet Take 850 mg by mouth 2 (two) times daily with a meal.    [provider]  metoprolol succinate  (TOPROL-XL) 25 MG 24 hr tablet Take 25 mg by mouth daily. 06/28/18   [provider]  Multiple Vitamin (MULTIVITAMIN WITH MINERALS) TABS tablet Take 1 tablet by mouth daily.    [provider]  oxyCODONE-acetaminophen (PERCOCET) 10-325 MG tablet Take 1 tablet by mouth 2 (two) times daily as needed for pain. 08/16/21   [provider]  pantoprazole (PROTONIX) 40 MG tablet Take 40 mg by mouth daily.    [provider]  polyethylene glycol-electrolytes (TRILYTE) 420 g solution Take 4,000 mLs by mouth as directed. 12/12/21   Harvel Quale, MD  rosuvastatin (CRESTOR) 5 MG tablet Take 5 mg by mouth once a week. 09/06/21   [provider]  SYMBICORT 160-4.5 MCG/ACT inhaler Inhale 2 puffs into the lungs in the morning and at bedtime. 07/14/21   [provider]  traZODone (DESYREL) 100 MG tablet Take 200 mg by mouth at bedtime as needed for sleep. 09/08/21   [provider]  triamcinolone cream (KENALOG) 0.1 % Apply 1 application topically daily as needed (affected area(s) on skin).  04/15/17   [provider]  TRULANCE 3 MG TABS Take 1 tablet by mouth daily. 08/25/21   [provider]      Allergies    Lipitor [atorvastatin], Zocor [simvastatin-high dose], Celexa [citalopram hydrobromide], Crestor [rosuvastatin], Mobic [meloxicam], and Prozac [fluoxetine hcl]    Review of Systems   Review of Systems  Physical Exam Updated Vital Signs BP (!) 155/88 (BP Location: Right Arm)   Pulse 96   Temp 98 F (36.7 C) (Oral)   Resp 16   Ht 5' 7"$  (1.702 m)   Wt 71.7 kg   SpO2 99%   BMI 24.75 kg/m  Physical Exam Vitals and nursing note reviewed.  Constitutional:      General: He is not in acute distress.    Appearance: He is well-developed.  HENT:     Head: Normocephalic.     Comments: Mild contusion right occipital area    Mouth/Throat:     Mouth: Mucous membranes are moist.  Eyes:     Conjunctiva/sclera:  Conjunctivae normal.  Cardiovascular:     Rate and Rhythm: Normal rate and regular rhythm.     Heart sounds: No murmur heard. Pulmonary:     Effort: Pulmonary effort is normal. No respiratory distress.     Breath sounds: Normal breath sounds.  Chest:     Chest wall: No tenderness or crepitus.  Abdominal:     Palpations: Abdomen is soft.     Tenderness: There is no abdominal tenderness.  Musculoskeletal:        General: No swelling.     Cervical back: Normal and neck supple.     Thoracic back: Normal.     Lumbar back: Normal.     Comments: Tenderness left ischial tuberosity  and coccyx, patient can bear full weight and ambulate  Skin:    General: Skin is warm and dry.     Capillary Refill: Capillary refill takes less than 2 seconds.  Neurological:     General: No focal deficit present.     Mental Status: He is alert and oriented to person, place, and time.  Psychiatric:        Mood and Affect: Mood normal.     ED Results / Procedures / Treatments   Labs (all labs ordered are listed, but only abnormal results are displayed) Labs Reviewed  CBG MONITORING, ED - Abnormal; Notable for the following components:      Result Value   Glucose-Capillary 181 (*)    All other components within normal limits    EKG None  Radiology DG Sacrum/Coccyx  Result Date: 05/10/2022 CLINICAL DATA:  77 year old male with history of trauma from a fall complaining of pain in the sacrococcygeal region. EXAM: SACRUM AND COCCYX - 2+ VIEW COMPARISON:  Bony pelvis 09/13/2021. FINDINGS: There is no evidence of fracture or other focal bone lesions. IMPRESSION: Negative. Electronically Signed   By: Vinnie Langton M.D.   On: 05/10/2022 16:12   CT Head Wo Contrast  Result Date: 05/10/2022 CLINICAL DATA:  77 year old male with history of trauma from a fall. EXAM: CT HEAD WITHOUT CONTRAST CT CERVICAL SPINE WITHOUT CONTRAST TECHNIQUE: Multidetector CT imaging of the head and cervical spine was performed  following the standard protocol without intravenous contrast. Multiplanar CT image reconstructions of the cervical spine were also generated. RADIATION DOSE REDUCTION: This exam was performed according to the departmental dose-optimization program which includes automated exposure control, adjustment of the mA and/or kV according to patient size and/or use of iterative reconstruction technique. COMPARISON:  Head CT and cervical spine CT 11/14/2021. FINDINGS: CT HEAD FINDINGS Brain: Mild cerebral atrophy. Patchy and confluent areas of decreased attenuation are noted throughout the deep and periventricular white matter of the cerebral hemispheres bilaterally, compatible with chronic microvascular ischemic disease. No evidence of acute infarction, hemorrhage, hydrocephalus, extra-axial collection or mass lesion/mass effect. Vascular: No hyperdense vessel or unexpected calcification. Skull: Normal. Negative for fracture or focal lesion. Status post right mastoidectomy. Sinuses/Orbits: No acute finding. Other: None. CT CERVICAL SPINE FINDINGS Alignment: Normal. Skull base and vertebrae: No acute fracture. No primary bone lesion or focal pathologic process. Soft tissues and spinal canal: No prevertebral fluid or swelling. No visible canal hematoma. Disc levels: Multilevel degenerative disc disease, most severe at C5-C6 and C6-C7. Moderate multilevel facet arthropathy bilaterally. Upper chest: Unremarkable. Other: None. IMPRESSION: 1. No evidence of significant acute traumatic injury to the skull, brain or cervical spine. 2. Mild cerebral atrophy with chronic microvascular ischemic changes in the cerebral white matter, as above. 3. Multilevel degenerative disc disease and cervical spondylosis, as above. Electronically Signed   By: Vinnie Langton M.D.   On: 05/10/2022 16:09   CT Cervical Spine Wo Contrast  Result Date: 05/10/2022 CLINICAL DATA:  77 year old male with history of trauma from a fall. EXAM: CT HEAD  WITHOUT CONTRAST CT CERVICAL SPINE WITHOUT CONTRAST TECHNIQUE: Multidetector CT imaging of the head and cervical spine was performed following the standard protocol without intravenous contrast. Multiplanar CT image reconstructions of the cervical spine were also generated. RADIATION DOSE REDUCTION: This exam was performed according to the departmental dose-optimization program which includes automated exposure control, adjustment of the mA and/or kV according to patient size and/or use of iterative reconstruction technique. COMPARISON:  Head CT and cervical  spine CT 11/14/2021. FINDINGS: CT HEAD FINDINGS Brain: Mild cerebral atrophy. Patchy and confluent areas of decreased attenuation are noted throughout the deep and periventricular white matter of the cerebral hemispheres bilaterally, compatible with chronic microvascular ischemic disease. No evidence of acute infarction, hemorrhage, hydrocephalus, extra-axial collection or mass lesion/mass effect. Vascular: No hyperdense vessel or unexpected calcification. Skull: Normal. Negative for fracture or focal lesion. Status post right mastoidectomy. Sinuses/Orbits: No acute finding. Other: None. CT CERVICAL SPINE FINDINGS Alignment: Normal. Skull base and vertebrae: No acute fracture. No primary bone lesion or focal pathologic process. Soft tissues and spinal canal: No prevertebral fluid or swelling. No visible canal hematoma. Disc levels: Multilevel degenerative disc disease, most severe at C5-C6 and C6-C7. Moderate multilevel facet arthropathy bilaterally. Upper chest: Unremarkable. Other: None. IMPRESSION: 1. No evidence of significant acute traumatic injury to the skull, brain or cervical spine. 2. Mild cerebral atrophy with chronic microvascular ischemic changes in the cerebral white matter, as above. 3. Multilevel degenerative disc disease and cervical spondylosis, as above. Electronically Signed   By: Vinnie Langton M.D.   On: 05/10/2022 16:09     Procedures Procedures    Medications Ordered in ED Medications  HYDROcodone-acetaminophen (NORCO/VICODIN) 5-325 MG per tablet 1 tablet (1 tablet Oral Given 05/10/22 1536)    ED Course/ Medical Decision Making/ A&P                             Medical Decision Making This patient presents to the ED for concern of injury on Eliquis and coccyx pain, this involves an extensive number of treatment options, and is a complaint that carries with it a high risk of complications and morbidity.  The differential diagnosis includes concussion, intracranial hemorrhage, contusion, fracture, other   Co morbidities that complicate the patient evaluation  Anticoagulant use   Additional history obtained:  Additional history obtained from EMR External records from outside source obtained and reviewed including prior hospitalist note and outpatient PCP notes   Lab Tests:  I Ordered, and personally interpreted labs.  The pertinent results include: Blood glucose is 181   Imaging Studies ordered:  I ordered imaging studies including CT head, CT C-spine and x-ray sacrum and coccyx I independently visualized and interpreted imaging which showed acute traumatic injuries I agree with the radiologist interpretation     Problem List / ED Course / Critical interventions / Medication management  Close head injury-patient does not have intracranial hemorrhage, no C-spine injury Tenderness to left ischial tuberosity and coccyx-no fracture on x-ray.  Patient is able to ambulate.  He still having some mild pain, offered CT to rule out nondisplaced fracture but he is feeling well enough and can ambulate and does not want this but states if his pain worsens he will come back. I ordered medication including Norco for pain Reevaluation of the patient after these medicines showed that the patient improved I have reviewed the patients home medicines and have made adjustments as needed       Amount  and/or Complexity of Data Reviewed Radiology: ordered.  Risk Prescription drug management.           Final Clinical Impression(s) / ED Diagnoses Final diagnoses:  Injury of head, initial encounter  Coccyx contusion, initial encounter    Rx / DC Orders ED Discharge Orders     None         Darci Current 05/10/22 1751    Milton Ferguson, MD  05/12/22 1633  

## 2022-05-14 ENCOUNTER — Telehealth: Payer: Self-pay

## 2022-05-14 NOTE — Telephone Encounter (Signed)
        Patient  visited [place] on 2/18    Telephone encounter attempt :  1st  A HIPAA compliant voice message was left requesting a return call.  Instructed patient to call back   [facility] [phone] [address], [place], [place] [address] Phone: [phone] Email: [email or web address]

## 2022-05-15 ENCOUNTER — Telehealth: Payer: Self-pay

## 2022-05-15 NOTE — Telephone Encounter (Signed)
        Patient  visited [place] on 2/18     Telephone encounter attempt :  2nd  A HIPAA compliant voice message was left requesting a return call.  Instructed patient to call back    [facility] [phone] [address], [place], [place] [address] Phone: [phone] Email: [email or web address]

## 2022-05-16 DIAGNOSIS — S82892B Other fracture of left lower leg, initial encounter for open fracture type I or II: Secondary | ICD-10-CM | POA: Diagnosis not present

## 2022-05-16 DIAGNOSIS — R531 Weakness: Secondary | ICD-10-CM | POA: Diagnosis not present

## 2022-05-16 DIAGNOSIS — E876 Hypokalemia: Secondary | ICD-10-CM | POA: Diagnosis not present

## 2022-05-16 DIAGNOSIS — G9341 Metabolic encephalopathy: Secondary | ICD-10-CM | POA: Diagnosis not present

## 2022-05-20 DIAGNOSIS — F17210 Nicotine dependence, cigarettes, uncomplicated: Secondary | ICD-10-CM | POA: Diagnosis not present

## 2022-05-20 DIAGNOSIS — M25552 Pain in left hip: Secondary | ICD-10-CM | POA: Diagnosis not present

## 2022-05-20 DIAGNOSIS — Z299 Encounter for prophylactic measures, unspecified: Secondary | ICD-10-CM | POA: Diagnosis not present

## 2022-05-25 DIAGNOSIS — I10 Essential (primary) hypertension: Secondary | ICD-10-CM | POA: Diagnosis not present

## 2022-05-25 DIAGNOSIS — F17210 Nicotine dependence, cigarettes, uncomplicated: Secondary | ICD-10-CM | POA: Diagnosis not present

## 2022-05-25 DIAGNOSIS — M7989 Other specified soft tissue disorders: Secondary | ICD-10-CM | POA: Diagnosis not present

## 2022-05-25 DIAGNOSIS — Z299 Encounter for prophylactic measures, unspecified: Secondary | ICD-10-CM | POA: Diagnosis not present

## 2022-05-25 DIAGNOSIS — S7002XS Contusion of left hip, sequela: Secondary | ICD-10-CM | POA: Diagnosis not present

## 2022-05-30 ENCOUNTER — Encounter (HOSPITAL_COMMUNITY): Payer: Self-pay | Admitting: *Deleted

## 2022-05-30 ENCOUNTER — Other Ambulatory Visit: Payer: Self-pay

## 2022-05-30 ENCOUNTER — Emergency Department (HOSPITAL_COMMUNITY)
Admission: EM | Admit: 2022-05-30 | Discharge: 2022-05-30 | Disposition: A | Discharge status: Home / Self Care | Payer: 59 | Attending: Emergency Medicine | Admitting: Emergency Medicine

## 2022-05-30 DIAGNOSIS — S90122A Contusion of left lesser toe(s) without damage to nail, initial encounter: Secondary | ICD-10-CM | POA: Diagnosis not present

## 2022-05-30 DIAGNOSIS — M7989 Other specified soft tissue disorders: Secondary | ICD-10-CM | POA: Diagnosis not present

## 2022-05-30 DIAGNOSIS — N289 Disorder of kidney and ureter, unspecified: Secondary | ICD-10-CM | POA: Diagnosis not present

## 2022-05-30 DIAGNOSIS — S300XXA Contusion of lower back and pelvis, initial encounter: Secondary | ICD-10-CM

## 2022-05-30 DIAGNOSIS — Z794 Long term (current) use of insulin: Secondary | ICD-10-CM | POA: Diagnosis not present

## 2022-05-30 DIAGNOSIS — Z7951 Long term (current) use of inhaled steroids: Secondary | ICD-10-CM | POA: Diagnosis not present

## 2022-05-30 DIAGNOSIS — J449 Chronic obstructive pulmonary disease, unspecified: Secondary | ICD-10-CM | POA: Diagnosis not present

## 2022-05-30 DIAGNOSIS — Z7901 Long term (current) use of anticoagulants: Secondary | ICD-10-CM | POA: Diagnosis not present

## 2022-05-30 DIAGNOSIS — W01198A Fall on same level from slipping, tripping and stumbling with subsequent striking against other object, initial encounter: Secondary | ICD-10-CM | POA: Diagnosis not present

## 2022-05-30 DIAGNOSIS — M79605 Pain in left leg: Secondary | ICD-10-CM | POA: Diagnosis present

## 2022-05-30 LAB — BASIC METABOLIC PANEL
Anion gap: 7 (ref 5–15)
BUN: 13 mg/dL (ref 8–23)
CO2: 28 mmol/L (ref 22–32)
Calcium: 8.5 mg/dL — ABNORMAL LOW (ref 8.9–10.3)
Chloride: 101 mmol/L (ref 98–111)
Creatinine, Ser: 1.34 mg/dL — ABNORMAL HIGH (ref 0.61–1.24)
GFR, Estimated: 55 mL/min — ABNORMAL LOW (ref >60)
Glucose, Bld: 196 mg/dL — ABNORMAL HIGH (ref 70–99)
Potassium: 4.5 mmol/L (ref 3.5–5.1)
Sodium: 136 mmol/L (ref 135–145)

## 2022-05-30 LAB — CBC
HCT: 33.6 % — ABNORMAL LOW (ref 39.0–52.0)
Hemoglobin: 11 g/dL — ABNORMAL LOW (ref 13.0–17.0)
MCH: 30.9 pg (ref 26.0–34.0)
MCHC: 32.7 g/dL (ref 30.0–36.0)
MCV: 94.4 fL (ref 80.0–100.0)
Platelets: 267 10*3/uL (ref 150–400)
RBC: 3.56 MIL/uL — ABNORMAL LOW (ref 4.22–5.81)
RDW: 14.2 % (ref 11.5–15.5)
WBC: 5.8 10*3/uL (ref 4.0–10.5)
nRBC: 0 % (ref 0.0–0.2)

## 2022-05-30 MED ORDER — OXYCODONE-ACETAMINOPHEN 5-325 MG PO TABS
2.0000 | ORAL_TABLET | Freq: Once | ORAL | Status: AC
  Administered 2022-05-30: 2 via ORAL
  Filled 2022-05-30: qty 2

## 2022-05-30 NOTE — ED Triage Notes (Addendum)
Pt c/o left buttocks, hip and leg pain since he fell 2 weeks ago. Pt is currently on Eliquis. Pt reports he was seen after the fall but only had scans of his head. Pt saw his PCP and was told to come to the ED and have scans of his hip and leg. Pt also reports his PCP is concerned he may have a blood clot in his leg.

## 2022-05-30 NOTE — ED Provider Notes (Signed)
[facility] Provider Note   CSN: [id number] Arrival date & time: 05/30/22  0746     History  Chief Complaint  Patient presents with   Leg Pain    [place] is a 77 y.o. male.   Leg Pain  This patient is a 77 year old male, he is currently on Eliquis, history of atypical atrial flutter based on cardiology notes from Dr. Harl Bowie and a history of COPD.  He has followed by his family doctor in [place], Dr. Woody Seller.  The patient presents to the hospital today with a complaint of left leg pain and swelling.  He reports that after having a fall on February 18 he fell and hit his head as well as his left but and has had some pain in the buttock and sacral area.  Of note the patient did have x-rays of the sacrum and coccyx at that time which were negative for fractures.  Unfortunately he has had some ongoing pain in the left buttock and has noticed his left leg has been swelling.  He had seen his family doctor and he reports that his family doctor told him that he needed an ultrasound, the patient had declined at that time and stated he would just come to the ER if things got worse.  He has had increasing pain overnight in that left leg and has presented to the hospital for evaluation of that pain.  He notes that his foot has been bruised over the last few days though he has not had any injuries to the leg and has no pain in his foot.  He denies shortness of breath or cough, he has not been taking any pain medication at home though he has been treated in the past with Percocet for chronic conditions before he is no longer having any of those.  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  He continues to take his Eliquis as prescribed     Home Medications Prior to Admission medications   Medication Sig Start Date End Date Taking? Authorizing Provider  albuterol (VENTOLIN HFA) 108 (90 Base) MCG/ACT inhaler  Inhale 1-2 puffs into the lungs every 6 (six) hours as needed for wheezing or shortness of breath. 07/15/21   Smoot, Sarah A, PA-C  ALPRAZolam Duanne Moron) 1 MG tablet Take 0.5 tablets (0.5 mg total) by mouth 3 (three) times daily. 09/15/20   Johnson, Clanford L, MD  apixaban (ELIQUIS) 5 MG TABS tablet TAKE 1 TABLET BY MOUTH TWICE DAILY 01/19/22   Arnoldo Lenis, MD  b complex vitamins tablet Take 1 tablet by mouth daily. With C    [provider]  bimatoprost (LUMIGAN) 0.01 % SOLN Place 1 drop into the right eye at bedtime.    [provider]  BREO ELLIPTA 100-25 MCG/ACT AEPB Inhale 1 puff into the lungs daily. 10/31/21   [provider]  buprenorphine (SUBUTEX) 2 MG SUBL SL tablet Place 2 mg under the tongue daily. 09/12/21   [provider]  cetirizine (ZYRTEC) 10 MG tablet Take 10 mg by mouth daily. 09/06/21   [provider]  Cholecalciferol 50 MCG (2000 UT) TABS Take 2,000 Units by mouth daily.    [provider]  Continuous Blood Gluc Receiver (FREESTYLE LIBRE 2 READER) DEVI daily. 09/30/21   [provider]  Continuous Blood Gluc Sensor (FREESTYLE LIBRE 2 SENSOR) MISC SMARTSIG:Via Meter Every 10 Days 09/29/21  [provider]  cyanocobalamin 2000 MCG tablet Take 2,000 mcg by mouth daily.    [provider]  docusate sodium (COLACE) 100 MG capsule Take 1 capsule (100 mg total) by mouth 2 (two) times daily. While taking narcotic pain medicine. 12/06/18   Corky Sing, PA-C  dorzolamide (TRUSOPT) 2 % ophthalmic solution Place 1 drop into both eyes 3 (three) times daily.  04/15/17   [provider]  DULoxetine (CYMBALTA) 30 MG capsule Take 30 mg by mouth daily.  10/11/17   [provider]  fluticasone (FLONASE) 50 MCG/ACT nasal spray Place 2 sprays into both nostrils daily.     [provider]  gabapentin (NEURONTIN) 400 MG capsule Take 400 mg by mouth 2 (two) times daily. 09/06/21   [provider]  HUMALOG KWIKPEN 100 UNIT/ML KwikPen Inject 5 Units into the skin in the morning, at noon, and at bedtime. 09/06/21   [provider]  hydrOXYzine (ATARAX) 25 MG tablet Take 25 mg by mouth at bedtime. 09/06/21   [provider]  insulin glargine (LANTUS) 100 UNIT/ML injection Inject 0.1-0.18 mLs (10-18 Units total) into the skin See admin instructions. Per sliding  16 units in the morning and 8-10 units in the evening 09/15/20   Johnson, Clanford L, MD  ipratropium-albuterol (DUONEB) 0.5-2.5 (3) MG/3ML SOLN Take 3 mLs by nebulization every 6 (six) hours as needed for shortness of breath. 09/03/21   [provider]  ketoconazole (NIZORAL) 2 % cream Apply 1 application topically daily.    [provider]  losartan (COZAAR) 100 MG tablet Take 100 mg by mouth daily. 09/06/21   [provider]  metFORMIN (GLUCOPHAGE) 850 MG tablet Take 850 mg by mouth 2 (two) times daily with a meal.    [provider]  metoprolol succinate (TOPROL-XL) 25 MG 24 hr tablet Take 25 mg by mouth daily. 06/28/18   [provider]  Multiple Vitamin (MULTIVITAMIN WITH MINERALS) TABS tablet Take 1 tablet by mouth daily.    [provider]  oxyCODONE-acetaminophen (PERCOCET) 10-325 MG tablet Take 1 tablet by mouth 2 (two) times daily as needed for pain. 08/16/21   [provider]  pantoprazole (PROTONIX) 40 MG tablet Take 40 mg by mouth daily.    [provider]  polyethylene glycol-electrolytes (TRILYTE) 420 g solution Take 4,000 mLs by mouth as directed. 12/12/21   Harvel Quale, MD  rosuvastatin (CRESTOR) 5 MG tablet Take 5 mg by mouth once a week. 09/06/21   [provider]  SYMBICORT 160-4.5 MCG/ACT inhaler Inhale 2 puffs into the lungs in the morning and at bedtime. 07/14/21   [provider]  traZODone (DESYREL) 100 MG tablet Take 200 mg by mouth at bedtime as needed for sleep. 09/08/21   [provider]  triamcinolone cream (KENALOG) 0.1 % Apply 1 application topically daily as needed (affected area(s) on skin).  04/15/17   [provider]  TRULANCE 3 MG TABS Take 1 tablet by mouth daily. 08/25/21   [provider]      Allergies    Lipitor [atorvastatin], Zocor [simvastatin-high dose], Celexa [citalopram hydrobromide], Crestor [rosuvastatin], Mobic [meloxicam], and Prozac [fluoxetine hcl]    Review of Systems   Review of Systems  All other systems reviewed and are negative.   Physical Exam Updated Vital Signs BP 134/80 (BP Location: Right Wrist)   Pulse (!) 109   Temp 98.3 F (36.8 C) (Oral)   Resp 18   Ht 1.702  m ('5\' 7"'$ )   Wt 71.7 kg   SpO2 97%   BMI 24.75 kg/m  Physical Exam Vitals and nursing note reviewed.  Constitutional:      General: He is not in acute distress.    Appearance: He is well-developed.  HENT:     Head: Normocephalic and atraumatic.     Mouth/Throat:     Pharynx: No oropharyngeal exudate.  Eyes:     General: No scleral icterus.       Right eye: No discharge.        Left eye: No discharge.     Conjunctiva/sclera: Conjunctivae normal.     Pupils: Pupils are equal, round, and reactive to light.  Neck:     Thyroid: No thyromegaly.     Vascular: No JVD.  Cardiovascular:     Rate and Rhythm: Regular rhythm. Tachycardia present.     Heart sounds: Normal heart sounds. No murmur heard.    No friction rub. No gallop.     Comments: Heart rate of 105 bpm, normal pulses Pulmonary:     Effort: Pulmonary effort is normal. No respiratory distress.     Breath sounds: Normal breath sounds. No wheezing or rales.  Abdominal:     General: Bowel sounds are normal. There is no distension.     Palpations: Abdomen is soft. There is no mass.     Tenderness: There is no abdominal tenderness.  Musculoskeletal:        General: Tenderness present.     Cervical back: Normal range of motion and neck supple.     Comments: The patient has a  normal-appearing spine, upper extremities are totally unremarkable.  He has what appears to be a Charcot joint of his right ankle and foot, no tenderness.  His left lower extremity is examined in entirety from the buttock all the way through the toes.  There is an isolated hematoma of the left buttock, totally normal range of motion of the hip and the knee on that side.  He has good range of motion of the ankle as well.  He does have what appears to be bruising and mild edema from his knee through his foot.  There is bruising in the second third and fourth toes but no range of motion tenderness whatsoever in the foot or the toes.  He has normal pulses at the foot.  Lymphadenopathy:     Cervical: No cervical adenopathy.  Skin:    General: Skin is warm and dry.     Findings: No erythema or rash.  Neurological:     Mental Status: He is alert.     Coordination: Coordination normal.  Psychiatric:        Behavior: Behavior normal.     ED Results / Procedures / Treatments   Labs (all labs ordered are listed, but only abnormal results are displayed) Labs Reviewed  CBC - Abnormal; Notable for the following components:      Result Value   RBC 3.56 (*)    Hemoglobin 11.0 (*)    HCT 33.6 (*)    All other components within normal limits  BASIC METABOLIC PANEL - Abnormal; Notable for the following components:   Glucose, Bld 196 (*)    Creatinine, Ser 1.34 (*)    Calcium 8.5 (*)    GFR, Estimated 55 (*)    All other components within normal limits    EKG None  Radiology No results found.  Procedures Procedures    Medications Ordered  in ED Medications  oxyCODONE-acetaminophen (PERCOCET/ROXICET) 5-325 MG per tablet 2 tablet (2 tablets Oral Given 05/30/22 0815)    ED Course/ Medical Decision Making/ A&P                             Medical Decision Making Amount and/or Complexity of Data Reviewed Labs: ordered.  Risk Prescription drug management.   This patient presents to the  ED for concern of swelling of the left leg differential diagnosis includes likely blood products that are collecting in the leg after a hematoma in the buttock, the patient is anticoagulated making it more difficult for this to stop.  He is taking the Eliquis making DVT much less likely.  He is mildly tachycardic but has a history of atrial flutter, I would be somewhat concerned about anemia given the appearance of the buttock and the leg as he may have lost significant amount of blood in the leg so I will order a hemoglobin as well    Additional history obtained:  Additional history obtained from electronic medical record External records from outside source obtained and reviewed including notes from orthopedics as well as prior x-rays that were obtained after falls   Lab Tests:  I Ordered, and personally interpreted labs.  The pertinent results include:  ` CBC shows that the patient has no significant anemia compared to baseline, renal insufficiency is mild and needs to be followed up, the patient was given these results   Imaging Studies ordered:  Ultrasound ordered to be done Monday morning as they are not available on the weekend  Medicines ordered and prescription drug management:  I ordered medication including oxycodone for pain Reevaluation of the patient after these medicines showed that the patient improved I have reviewed the patients home medicines and have made adjustments as needed   Problem List / ED Course:  Patient is low risk, he is already anticoagulated which is likely the cause of the hematoma and the swelling of the leg.  He was informed that he needs to wear compression stocking, needs to use ice packs and needs to follow-up with his family doctor.  He after review of the prescribing database is already on buprenorphine and so I do not want to give him strong opiates for home as this may cause withdrawal or other complicating factors.  The patient is medically  stable and has extremely low risk for pulmonary embolism or even DVT for that matter being on Eliquis.  He expresses understanding as does his spouse, stable for discharge   Social Determinants of Health:  none           Final Clinical Impression(s) / ED Diagnoses Final diagnoses:  Left leg swelling  Contusion of buttock, initial encounter  Renal insufficiency    Rx / DC Orders ED Discharge Orders          Ordered    US Venous Img Lower Unilateral Left        05/30/22 0856              Noemi Chapel, MD 05/30/22 [phone]

## 2022-05-30 NOTE — Discharge Instructions (Signed)
Your testing today shows that you have slight kidney damage, this is likely somewhat chronic but you do need to have your family doctor recheck your kidney within the week.  Your creatinine was 1.34.  Share this number with your doctor.  I have ordered an ultrasound to be done on Monday, see the phone number attached and make sure that you call that number to arrange the time for the ultrasound.  Please wear a compression sock on your leg to help with the swelling.  You may return to the ER for severe or worsening symptoms.  You should not be taking strong pain medications with the buprenorphine as that can cause you to go into withdrawal.  Tylenol 500 mg every 6 hours as needed for pain

## 2022-06-01 ENCOUNTER — Ambulatory Visit (HOSPITAL_COMMUNITY)
Admission: RE | Admit: 2022-06-01 | Discharge: 2022-06-01 | Disposition: A | Discharge status: Home / Self Care | Payer: 59 | Source: Ambulatory Visit | Attending: Emergency Medicine | Admitting: Emergency Medicine

## 2022-06-01 DIAGNOSIS — M7989 Other specified soft tissue disorders: Secondary | ICD-10-CM | POA: Insufficient documentation

## 2022-06-01 DIAGNOSIS — R600 Localized edema: Secondary | ICD-10-CM | POA: Diagnosis not present

## 2022-06-01 NOTE — ED Provider Notes (Signed)
Patient presenting for a DVT study of his left leg, it is negative for significant DVT, although the calf vasculature was incompletely evaluated, but no significant clot was found.  He is on daily Eliquis.  He was advised to continue this medication, ice packs per Dr. Ammie Ferrier recommendations and compression stockings as well.  Parent follow-up with his primary provider is recommended.  He and his wife at bedside understand and agree with this plan.   Jason Jefferson, PA-C 06/01/22 1224    Isla Pence, MD 06/02/22 [phone]

## 2022-06-05 ENCOUNTER — Telehealth: Payer: Self-pay

## 2022-06-05 NOTE — Telephone Encounter (Signed)
        Patient  visited [facility] on 05/30/2022  for leg pain.   Telephone encounter attempt :  1st  A HIPAA compliant voice message was left requesting a return call.  Instructed patient to call back at [phone].   [place] Resource Care Guide   [email or web address]  ?? [phone]   Website: [email or web address]  [email or web address]

## 2022-06-08 ENCOUNTER — Telehealth: Payer: Self-pay

## 2022-06-08 NOTE — Telephone Encounter (Signed)
        Patient  visited [facility] on 05/30/2022  for leg pain.   Telephone encounter attempt :  2nd  A HIPAA compliant voice message was left requesting a return call.  Instructed patient to call back at [phone].   [place] Resource Care Guide   [email or web address]  ?? [id number]   Website: [email or web address]  [email or web address]

## 2022-06-09 ENCOUNTER — Telehealth: Payer: Self-pay

## 2022-06-09 DIAGNOSIS — Z79891 Long term (current) use of opiate analgesic: Secondary | ICD-10-CM | POA: Diagnosis not present

## 2022-06-09 DIAGNOSIS — G894 Chronic pain syndrome: Secondary | ICD-10-CM | POA: Diagnosis not present

## 2022-06-09 DIAGNOSIS — Z79899 Other long term (current) drug therapy: Secondary | ICD-10-CM | POA: Diagnosis not present

## 2022-06-09 DIAGNOSIS — M4802 Spinal stenosis, cervical region: Secondary | ICD-10-CM | POA: Diagnosis not present

## 2022-06-09 NOTE — Telephone Encounter (Signed)
        Patient  visited [facility] on 05/30/2022  for leg pain.   Telephone encounter attempt :  3rd  No answer/busy unable to leave message.   [place] Resource Care Guide   [email or web address]  ?? [phone]   Website: [email or web address]  [email or web address]

## 2022-06-14 DIAGNOSIS — S82892B Other fracture of left lower leg, initial encounter for open fracture type I or II: Secondary | ICD-10-CM | POA: Diagnosis not present

## 2022-06-14 DIAGNOSIS — R531 Weakness: Secondary | ICD-10-CM | POA: Diagnosis not present

## 2022-06-14 DIAGNOSIS — G9341 Metabolic encephalopathy: Secondary | ICD-10-CM | POA: Diagnosis not present

## 2022-06-14 DIAGNOSIS — E876 Hypokalemia: Secondary | ICD-10-CM | POA: Diagnosis not present

## 2022-06-16 ENCOUNTER — Ambulatory Visit: Payer: Medicare Other | Admitting: Cardiology

## 2022-06-17 ENCOUNTER — Ambulatory Visit: Payer: Self-pay | Admitting: *Deleted

## 2022-06-17 NOTE — Patient Outreach (Signed)
  Care Coordination   06/17/2022 Name: Jason Woods MRN: [id number] DOB: [birth year]   Care Coordination Outreach Attempts:  An unsuccessful telephone outreach was attempted for a scheduled appointment today.  Follow Up Plan:  Additional outreach attempts will be made to offer the patient care coordination information and services.   Encounter Outcome:  No Answer. Left HIPAA compliant VM   Care Coordination Interventions:  No, not indicated    Chong Sicilian, BSN, RN-BC RN Care Coordinator [place]: [phone] Main #: [phone]

## 2022-06-18 ENCOUNTER — Other Ambulatory Visit (HOSPITAL_COMMUNITY): Payer: Self-pay | Admitting: Cardiology

## 2022-06-18 DIAGNOSIS — Z299 Encounter for prophylactic measures, unspecified: Secondary | ICD-10-CM | POA: Diagnosis not present

## 2022-06-18 DIAGNOSIS — F17210 Nicotine dependence, cigarettes, uncomplicated: Secondary | ICD-10-CM | POA: Diagnosis not present

## 2022-06-18 DIAGNOSIS — E1159 Type 2 diabetes mellitus with other circulatory complications: Secondary | ICD-10-CM | POA: Diagnosis not present

## 2022-06-18 DIAGNOSIS — I152 Hypertension secondary to endocrine disorders: Secondary | ICD-10-CM | POA: Diagnosis not present

## 2022-06-18 DIAGNOSIS — I10 Essential (primary) hypertension: Secondary | ICD-10-CM | POA: Diagnosis not present

## 2022-06-18 DIAGNOSIS — D692 Other nonthrombocytopenic purpura: Secondary | ICD-10-CM | POA: Diagnosis not present

## 2022-06-18 DIAGNOSIS — E11319 Type 2 diabetes mellitus with unspecified diabetic retinopathy without macular edema: Secondary | ICD-10-CM | POA: Diagnosis not present

## 2022-06-18 LAB — HEMOGLOBIN A1C: Hemoglobin A1C: 6.4

## 2022-06-18 NOTE — Telephone Encounter (Signed)
Prescription refill request for Eliquis received. Indication: A Flutter Last office visit: 12/11/21  Zandra Abts MD Scr: 1.34 on 05/30/22  Epic Age: 77 Weight: 71.8kg  Based on above findings Eliquis 5mg  twice daily is the appropriate dose.  Refill approved.

## 2022-06-21 DIAGNOSIS — Z5321 Procedure and treatment not carried out due to patient leaving prior to being seen by health care provider: Secondary | ICD-10-CM | POA: Diagnosis not present

## 2022-06-21 DIAGNOSIS — E162 Hypoglycemia, unspecified: Secondary | ICD-10-CM | POA: Diagnosis not present

## 2022-06-22 ENCOUNTER — Encounter (HOSPITAL_BASED_OUTPATIENT_CLINIC_OR_DEPARTMENT_OTHER): Payer: Self-pay | Admitting: *Deleted

## 2022-06-22 ENCOUNTER — Telehealth: Payer: Self-pay | Admitting: *Deleted

## 2022-06-22 NOTE — Telephone Encounter (Signed)
Opened under wrong practice  

## 2022-06-22 NOTE — Progress Notes (Signed)
  Care Coordination   Note   06/22/2022 Name: Jason Woods MRN: [id number] DOB: 07-28-1945  Jason Woods is a 77 y.o. year old male who sees Vyas, Dhruv B, MD for primary care. I reached out to Diona Fanti by phone today to offer care coordination services.  Mr. Gourdin was given information about Care Coordination services today including:   The Care Coordination services include support from the care team which includes your Nurse Coordinator, Clinical Social Worker, or Pharmacist.  The Care Coordination team is here to help remove barriers to the health concerns and goals most important to you. Care Coordination services are voluntary, and the patient may decline or stop services at any time by request to their care team member.   Care Coordination Consent Status: Patient agreed to services and verbal consent obtained.   Follow up plan:  Telephone appointment with care coordination team member scheduled for:  06/23/22  Encounter Outcome:  Pt. Scheduled  [place]  Direct Dial: [phone]

## 2022-06-23 ENCOUNTER — Encounter: Payer: Self-pay | Admitting: *Deleted

## 2022-06-23 ENCOUNTER — Emergency Department (HOSPITAL_COMMUNITY): Payer: 59

## 2022-06-23 ENCOUNTER — Encounter (HOSPITAL_COMMUNITY): Payer: Self-pay | Admitting: Emergency Medicine

## 2022-06-23 ENCOUNTER — Emergency Department (HOSPITAL_COMMUNITY)
Admission: EM | Admit: 2022-06-23 | Discharge: 2022-06-23 | Disposition: A | Discharge status: Home / Self Care | Payer: 59 | Attending: Emergency Medicine | Admitting: Emergency Medicine

## 2022-06-23 ENCOUNTER — Ambulatory Visit: Payer: Self-pay | Admitting: *Deleted

## 2022-06-23 ENCOUNTER — Other Ambulatory Visit: Payer: Self-pay

## 2022-06-23 DIAGNOSIS — Z79899 Other long term (current) drug therapy: Secondary | ICD-10-CM | POA: Insufficient documentation

## 2022-06-23 DIAGNOSIS — Z7984 Long term (current) use of oral hypoglycemic drugs: Secondary | ICD-10-CM | POA: Insufficient documentation

## 2022-06-23 DIAGNOSIS — I10 Essential (primary) hypertension: Secondary | ICD-10-CM | POA: Insufficient documentation

## 2022-06-23 DIAGNOSIS — Z5189 Encounter for other specified aftercare: Secondary | ICD-10-CM

## 2022-06-23 DIAGNOSIS — E119 Type 2 diabetes mellitus without complications: Secondary | ICD-10-CM | POA: Insufficient documentation

## 2022-06-23 DIAGNOSIS — Z4800 Encounter for change or removal of nonsurgical wound dressing: Secondary | ICD-10-CM | POA: Insufficient documentation

## 2022-06-23 DIAGNOSIS — L89602 Pressure ulcer of unspecified heel, stage 2: Secondary | ICD-10-CM | POA: Diagnosis not present

## 2022-06-23 DIAGNOSIS — S90921A Unspecified superficial injury of right foot, initial encounter: Secondary | ICD-10-CM | POA: Diagnosis not present

## 2022-06-23 DIAGNOSIS — Z794 Long term (current) use of insulin: Secondary | ICD-10-CM | POA: Diagnosis not present

## 2022-06-23 DIAGNOSIS — Z4801 Encounter for change or removal of surgical wound dressing: Secondary | ICD-10-CM | POA: Diagnosis not present

## 2022-06-23 LAB — CBG MONITORING, ED: Glucose-Capillary: 227 mg/dL — ABNORMAL HIGH (ref 70–99)

## 2022-06-23 MED ORDER — OXYCODONE-ACETAMINOPHEN 5-325 MG PO TABS
1.0000 | ORAL_TABLET | Freq: Once | ORAL | Status: AC
  Administered 2022-06-23: 1 via ORAL
  Filled 2022-06-23: qty 1

## 2022-06-23 MED ORDER — OXYCODONE-ACETAMINOPHEN 5-325 MG PO TABS: 1.0000 | ORAL_TABLET | ORAL | 0 refills | Status: AC | PRN

## 2022-06-23 NOTE — Discharge Instructions (Addendum)
Continue to wrap your foot daily to protect the site.  Complete the entire course of the antibiotics prescribed by your doctor.  Plan to see him for recheck in a week.

## 2022-06-23 NOTE — ED Triage Notes (Signed)
Pt c/o wound to back of left ankle x 2 weeks. Seen pcp and told him to put neopsporin but pain is worse since. Pt a/o. Nad. Denies fevers. Pt states it is open skin.

## 2022-06-23 NOTE — ED Provider Notes (Signed)
[place] Provider Note   CSN: [id number] Arrival date & time: 06/23/22  [id number]     History  Chief Complaint  Patient presents with   Wound Check    Jason Woods is a 77 y.o. male with a history of significant for type 2 diabetes, hypertension, GERD, also history of left ankle fusion greater than 4 years ago presenting for evaluation of a wound at his left heel.  He recently purchased new shoes and suspects this shoes have caused what was probably initially a blister weeks ago, but he continues to have pain at the site.  He was seen by his PCP and placed on an antibiotic, which he believes is called ampicillin, but takes as a tablet 3 times a day, currently on day 5.  He was also advised to apply Vaseline to the site which she has been doing.  He continues to have pain and swelling.  There has been no drainage from the site.  He denies fevers or chills.  His blood sugars are generally under fair control, states his A1c is in the mid 6 range.  He was sent a defective glucose freestyle sensor, awaiting a new one so has not been able to check his blood sugars.  He is currently a chronic pain patient for low back pain, had been under the care of Dr. Merlene Laughter, is currently out of his oxycodone while he is establishing care with a new pain specialist.  The history is provided by the patient and the spouse.       Home Medications Prior to Admission medications   Medication Sig Start Date End Date Taking? Authorizing Provider  oxyCODONE-acetaminophen (PERCOCET/ROXICET) 5-325 MG tablet Take 1 tablet by mouth every 4 (four) hours as needed. 06/23/22  Yes Sumayya Muha, Almyra Free, PA-C  albuterol (VENTOLIN HFA) 108 (90 Base) MCG/ACT inhaler Inhale 1-2 puffs into the lungs every 6 (six) hours as needed for wheezing or shortness of breath. 07/15/21   Smoot, Leary Roca, PA-C  ALPRAZolam Duanne Moron) 1 MG tablet Take 0.5 tablets (0.5 mg total) by mouth 3 (three) times daily.  09/15/20   Johnson, Clanford L, MD  b complex vitamins tablet Take 1 tablet by mouth daily. With C    [provider]  bimatoprost (LUMIGAN) 0.01 % SOLN Place 1 drop into the right eye at bedtime.    [provider]  BREO ELLIPTA 100-25 MCG/ACT AEPB Inhale 1 puff into the lungs daily. 10/31/21   [provider]  cetirizine (ZYRTEC) 10 MG tablet Take 10 mg by mouth daily. 09/06/21   [provider]  Cholecalciferol 50 MCG (2000 UT) TABS Take 2,000 Units by mouth daily.    [provider]  Continuous Blood Gluc Receiver (FREESTYLE LIBRE 2 READER) DEVI daily. 09/30/21   [provider]  Continuous Blood Gluc Sensor (FREESTYLE LIBRE 2 SENSOR) MISC SMARTSIG:Via Meter Every 10 Days 09/29/21   [provider]  cyanocobalamin 2000 MCG tablet Take 2,000 mcg by mouth daily.    [provider]  docusate sodium (COLACE) 100 MG capsule Take 1 capsule (100 mg total) by mouth 2 (two) times daily. While taking narcotic pain medicine. 12/06/18   Corky Sing, PA-C  dorzolamide (TRUSOPT) 2 % ophthalmic solution Place 1 drop into both eyes 3 (three) times daily.  04/15/17   [provider]  DULoxetine (CYMBALTA) 30 MG capsule Take 30 mg by mouth daily.  10/11/17   [provider]  Arne Cleveland  5 MG TABS tablet TAKE 1 TABLET BY MOUTH TWICE DAILY 06/18/22   Arnoldo Lenis, MD  fluticasone [facility]) 50 MCG/ACT nasal spray Place 2 sprays into both nostrils daily.     [provider]  gabapentin (NEURONTIN) 400 MG capsule Take 400 mg by mouth 2 (two) times daily. 09/06/21   [provider]  HUMALOG KWIKPEN 100 UNIT/ML KwikPen Inject 5 Units into the skin in the morning, at noon, and at bedtime. 09/06/21   [provider]  hydrOXYzine (ATARAX) 25 MG tablet Take 25 mg by mouth at bedtime. 09/06/21   [provider]  insulin glargine (LANTUS) 100 UNIT/ML injection Inject 0.1-0.18 mLs (10-18 Units total) into  the skin See admin instructions. Per sliding  16 units in the morning and 8-10 units in the evening 09/15/20   Johnson, Clanford L, MD  ipratropium-albuterol (DUONEB) 0.5-2.5 (3) MG/3ML SOLN Take 3 mLs by nebulization every 6 (six) hours as needed for shortness of breath. 09/03/21   [provider]  ketoconazole (NIZORAL) 2 % cream Apply 1 application topically daily.    [provider]  losartan (COZAAR) 100 MG tablet Take 100 mg by mouth daily. 09/06/21   [provider]  metFORMIN (GLUCOPHAGE) 850 MG tablet Take 850 mg by mouth 2 (two) times daily with a meal.    [provider]  metoprolol succinate (TOPROL-XL) 25 MG 24 hr tablet Take 25 mg by mouth daily. 06/28/18   [provider]  Multiple Vitamin (MULTIVITAMIN WITH MINERALS) TABS tablet Take 1 tablet by mouth daily.    [provider]  pantoprazole (PROTONIX) 40 MG tablet Take 40 mg by mouth daily.    [provider]  polyethylene glycol-electrolytes (TRILYTE) 420 g solution Take 4,000 mLs by mouth as directed. 12/12/21   Harvel Quale, MD  rosuvastatin (CRESTOR) 5 MG tablet Take 5 mg by mouth once a week. 09/06/21   [provider]  SYMBICORT 160-4.5 MCG/ACT inhaler Inhale 2 puffs into the lungs in the morning and at bedtime. 07/14/21   [provider]  traZODone (DESYREL) 100 MG tablet Take 200 mg by mouth at bedtime as needed for sleep. 09/08/21   [provider]  triamcinolone cream (KENALOG) 0.1 % Apply 1 application topically daily as needed (affected area(s) on skin).  04/15/17   [provider]  TRULANCE 3 MG TABS Take 1 tablet by mouth daily. 08/25/21   [provider]      Allergies    Lipitor [atorvastatin], Zocor [simvastatin-high dose], Celexa [citalopram hydrobromide], Crestor [rosuvastatin], Mobic [meloxicam], and Prozac [fluoxetine hcl]    Review of Systems   Review of Systems  Constitutional:  Negative for chills  and fever.  HENT: Negative.    Eyes: Negative.   Respiratory:  Negative for chest tightness and shortness of breath.   Cardiovascular:  Negative for chest pain.  Gastrointestinal:  Negative for abdominal pain, nausea and vomiting.  Genitourinary: Negative.   Musculoskeletal:  Positive for arthralgias. Negative for joint swelling and neck pain.  Skin:  Positive for wound. Negative for rash.  Neurological: Negative.   Psychiatric/Behavioral: Negative.      Physical Exam Updated Vital Signs BP (!) 163/93 (BP Location: Left Arm)   Pulse 77   Temp 97.8 F (36.6 C) (Oral)   Resp 16   Ht 5\' 7"  (1.702 m)   Wt 71.6 kg   SpO2 96%   BMI 24.72 kg/m  Physical Exam Constitutional:      General:  He is not in acute distress.    Appearance: He is well-developed.  HENT:     Head: Normocephalic.  Cardiovascular:     Rate and Rhythm: Normal rate.  Pulmonary:     Effort: Pulmonary effort is normal.     Breath sounds: No wheezing.  Musculoskeletal:        General: Normal range of motion.     Cervical back: Neck supple.  Skin:    Findings: Lesion present.     Comments: Dark round macular lesion left heel, soft fluctuance measuring 2 cm,  intact skin,  no induration or red streaking.  Central skin fissuring of horny layer, no drainage.      ED Results / Procedures / Treatments   Labs (all labs ordered are listed, but only abnormal results are displayed) Labs Reviewed  CBG MONITORING, ED - Abnormal; Notable for the following components:      Result Value   Glucose-Capillary 227 (*)    All other components within normal limits    EKG None  Radiology DG Os Calcis Left  Result Date: 06/23/2022 CLINICAL DATA:  Heel wound for 2 weeks. EXAM: LEFT OS CALCIS - 2+ VIEW COMPARISON:  Left ankle radiographs 09/14/2020, left foot radiographs 09/14/2020 FINDINGS: Redemonstration of thin metallic nail extending from the tibia through the talus and calcaneus. This is again fractured at the level  of the mid height of the talus with mild anterior apex angulation of the nail, unchanged. High-grade tibiotalar osteoarthritis. Mild talonavicular joint space narrowing and dorsal osteophytosis. Mild plantar calcaneal heel spur. There is a linear lucency at the far posterior aspect of the subcutaneous heel soft tissues at the mid transverse dimension on frontal view. Similar linear lucency at the posterolateral heel soft tissues near the skin surface. Both these may represent small soft tissue fissures. No underlying subcutaneous air. No definite cortical erosion. IMPRESSION: 1. There to posterior heel subcutaneous soft tissue "fissures. " No subcutaneous air or cortical erosion is seen to indicate radiographic evidence of acute osteomyelitis. No definite cortical erosion to suggest osteomyelitis. 2. High-grade tibiotalar osteoarthritis, unchanged. 3. Unchanged fractured tibia through calcaneal fixation nail. Electronically Signed   By: Yvonne Kendall M.D.   On: 06/23/2022 11:10    Procedures Procedures    Medications Ordered in ED Medications  oxyCODONE-acetaminophen (PERCOCET/ROXICET) 5-325 MG per tablet 1 tablet (1 tablet Oral Given 06/23/22 1102)    ED Course/ Medical Decision Making/ A&P                             Medical Decision Making Pt with left heel closed wound,  macular coin shaped lesion, fluctuance without drainage, induration or red streaking.  Currently on abx, pt stating ampicillin tabs day 5 (? Amoxicillin).   He has no findings on today's exam to suspect worsening infection, there is no drainage, no red streaking, no erythema or induration.  He was encouraged to complete the course of his antibiotics and plan close follow-up with either his primary provider.  He actually states he has a follow-up appointment with Dr. Doran Durand in [place] soon.  Patient was given Xeroform and the wound was dressed using Kling and an Ace wrap.  Given the bulkiness of the dressing he was also given a  postop shoe.  Amount and/or Complexity of Data Reviewed Labs: ordered.    Details: CBG is elevated at 227, patient had just ate breakfast and also was consuming candy prior to  arrival. Radiology: ordered.    Details: Reviewed and agree with interpretation.  No osteomyelitis  Risk OTC drugs.           Final Clinical Impression(s) / ED Diagnoses Final diagnoses:  Visit for wound check    Rx / DC Orders ED Discharge Orders          Ordered    oxyCODONE-acetaminophen (PERCOCET/ROXICET) 5-325 MG tablet  Every 4 hours PRN        06/23/22 1200              Landis Martins 06/23/22 1904    Godfrey Pick, MD 06/24/22 1649

## 2022-06-26 ENCOUNTER — Telehealth: Payer: Self-pay

## 2022-06-26 NOTE — Telephone Encounter (Signed)
     Patient  visit on 4/2  at [facility]    Have you been able to follow up with your primary care physician? Yes   The patient was or was not able to obtain any needed medicine or equipment. Yes   Are there diet recommendations that you are having difficulty following? Na   Patient expresses understanding of discharge instructions and education provided has no other needs at this time. Yes      Gari Trovato [facility] Guide, [place] [phone] [address], [place], [place] [address] Phone: [phone] Email: [email or web address]       

## 2022-06-30 ENCOUNTER — Encounter: Payer: Self-pay | Admitting: *Deleted

## 2022-06-30 ENCOUNTER — Ambulatory Visit: Payer: Self-pay | Admitting: *Deleted

## 2022-06-30 NOTE — Patient Outreach (Signed)
  Care Coordination   Follow Up Visit Note   06/30/2022 Name: Jason Woods MRN: [id number] DOB: 08-06-1945  Jason Woods is a 77 y.o. year old male who sees Vyas, Dhruv B, MD for primary care. I spoke with  Jason Woods by phone today.  What matters to the patients health and wellness today?  Managing foot wound    Goals Addressed             This Visit's Progress    Resolution of Left Heel Wound       Care Coordination Goals: Patient will continue to clean wound with mild soap daily and will apply ointment and cover when going out. Can leave open to air when at home.  Patient will continue to monitor for s/s of infection or worsening wound and will notify provider of any changes Patient will assess feet for other wound, broken skin, blisters, etc and notify provider of any new areas Patient will reach out to RN Care Manager [phone] with any care coordination or resource needs        SDOH assessments and interventions completed:  No    Care Coordination Interventions:  Yes, provided  Interventions Today    Flowsheet Row Most Recent Value  Chronic Disease   Chronic disease during today's visit Diabetes, Other  [left heel wound]  General Interventions   General Interventions Discussed/Reviewed General Interventions Discussed, General Interventions Reviewed, Annual Foot Exam, Doctor Visits  Doctor Visits Discussed/Reviewed Doctor Visits Discussed, PCP, Specialist, Doctor Visits Reviewed  [sees podiatrist every 3 months for nail trimming and foot assessment. discussed recent ED visit for wound.]  PCP/Specialist Visits Compliance with follow-up visit  Education Interventions   Education Provided Provided Education  Provided Verbal Education On Other, When to see the doctor, Mental Health/Coping with Illness  [foot care]  Safety Interventions   Safety Discussed/Reviewed Safety Discussed       Follow up plan: Follow up call scheduled for 07/29/22     Encounter Outcome:  Pt. Visit Completed   Jason Woods, BSN, RN-BC RN Care Coordinator [facility]  [facility] Direct Dial: [phone] Main #: [phone]

## 2022-06-30 NOTE — Patient Outreach (Signed)
  Care Coordination   Initial Visit Note   06/23/2022 Name: Jason Woods MRN: [id number] DOB: 06/23/1945  Jason Woods is a 77 y.o. year old male who sees Vyas, Dhruv B, MD for primary care. I spoke with  [name] by phone today.  What matters to the patients health and wellness today?  Managing blood sugar    Goals Addressed             This Visit's Progress    Manage Diabetes/Blood Sugar       Care Coordination Goals: Patient will follow-up with PCP and/or endocrinologist every 3 months or as recommended Patient will take medication as prescribed and reach out to provider with any negative side effects Patient will continue to monitor and record blood sugar 4 times per day and as needed with Freestyle Libre CGM, and will call PCP or endocrinologist with any readings outside of recommended range Patient will take blood sugar log and meter to provider visits for review Patient will follow a modified carbohydrate diet and decrease simple carbohydrates and sugars Patient will increase activity level as tolerated with an ultimate goal of at least 150 minutes of exercise per week Patient will check feet daily for sores, wounds, calluses, etc and will notify provider of any abnormal findings Patient will have yearly eye exams to check for or monitor diabetic retinopathy Patient will reach out to RN Care Coordinator [phone] with any care coordination or resource needs         SDOH assessments and interventions completed:  Yes SDOH Interventions Today    Flowsheet Row Most Recent Value  SDOH Interventions   Housing Interventions Intervention Not Indicated  Transportation Interventions Patient Resources (Friends/Family)  [patient is legally blind and depends on brother for transportation]  Utilities Interventions Intervention Not Indicated  Financial Strain Interventions Intervention Not Indicated        Care Coordination Interventions:  Yes, provided   Interventions Today    Flowsheet Row Most Recent Value  Chronic Disease   Chronic disease during today's visit Diabetes  General Interventions   General Interventions Discussed/Reviewed General Interventions Discussed, General Interventions Reviewed, Labs, Annual Foot Exam, Doctor Visits  Labs Hgb A1c every 3 months, Kidney Function  Doctor Visits Discussed/Reviewed Doctor Visits Discussed, Doctor Visits Reviewed, PCP  PCP/Specialist Visits Compliance with follow-up visit  Exercise Interventions   Exercise Discussed/Reviewed Physical Activity  Physical Activity Discussed/Reviewed Physical Activity Discussed, Physical Activity Reviewed  Education Interventions   Education Provided Provided Education  Provided Verbal Education On Nutrition, When to see the doctor, Mental Health/Coping with Illness, Foot Care, Labs, Blood Sugar Monitoring, Medication  Labs Reviewed Hgb A1c, Kidney Function  Nutrition Interventions   Nutrition Discussed/Reviewed Nutrition Discussed, Carbohydrate meal planning, Decreasing sugar intake, Portion sizes  Pharmacy Interventions   Pharmacy Dicussed/Reviewed Medications and their functions  Safety Interventions   Safety Discussed/Reviewed Safety Discussed, Home Safety, Fall Risk  Home Safety Assistive Devices       Follow up plan: Follow up call scheduled for 06/30/22    Encounter Outcome:  Pt. Visit Completed   Demetrios Loll, BSN, RN-BC RN Care Coordinator [facility]  [facility] Direct Dial: [phone] Main #: [phone]

## 2022-07-06 DIAGNOSIS — M14671 Charcot's joint, right ankle and foot: Secondary | ICD-10-CM | POA: Diagnosis not present

## 2022-07-06 DIAGNOSIS — E11621 Type 2 diabetes mellitus with foot ulcer: Secondary | ICD-10-CM | POA: Diagnosis not present

## 2022-07-08 DIAGNOSIS — Z79891 Long term (current) use of opiate analgesic: Secondary | ICD-10-CM | POA: Diagnosis not present

## 2022-07-08 DIAGNOSIS — M542 Cervicalgia: Secondary | ICD-10-CM | POA: Diagnosis not present

## 2022-07-08 DIAGNOSIS — G894 Chronic pain syndrome: Secondary | ICD-10-CM | POA: Diagnosis not present

## 2022-07-08 DIAGNOSIS — M549 Dorsalgia, unspecified: Secondary | ICD-10-CM | POA: Diagnosis not present

## 2022-07-08 DIAGNOSIS — M4802 Spinal stenosis, cervical region: Secondary | ICD-10-CM | POA: Diagnosis not present

## 2022-07-13 DIAGNOSIS — L570 Actinic keratosis: Secondary | ICD-10-CM | POA: Diagnosis not present

## 2022-07-13 DIAGNOSIS — L98499 Non-pressure chronic ulcer of skin of other sites with unspecified severity: Secondary | ICD-10-CM | POA: Diagnosis not present

## 2022-07-13 DIAGNOSIS — D045 Carcinoma in situ of skin of trunk: Secondary | ICD-10-CM | POA: Diagnosis not present

## 2022-07-13 DIAGNOSIS — D485 Neoplasm of uncertain behavior of skin: Secondary | ICD-10-CM | POA: Diagnosis not present

## 2022-07-15 DIAGNOSIS — R531 Weakness: Secondary | ICD-10-CM | POA: Diagnosis not present

## 2022-07-15 DIAGNOSIS — S82892B Other fracture of left lower leg, initial encounter for open fracture type I or II: Secondary | ICD-10-CM | POA: Diagnosis not present

## 2022-07-15 DIAGNOSIS — G9341 Metabolic encephalopathy: Secondary | ICD-10-CM | POA: Diagnosis not present

## 2022-07-15 DIAGNOSIS — E876 Hypokalemia: Secondary | ICD-10-CM | POA: Diagnosis not present

## 2022-07-20 DIAGNOSIS — I10 Essential (primary) hypertension: Secondary | ICD-10-CM | POA: Diagnosis not present

## 2022-07-20 DIAGNOSIS — F17210 Nicotine dependence, cigarettes, uncomplicated: Secondary | ICD-10-CM | POA: Diagnosis not present

## 2022-07-20 DIAGNOSIS — E1165 Type 2 diabetes mellitus with hyperglycemia: Secondary | ICD-10-CM | POA: Diagnosis not present

## 2022-07-20 DIAGNOSIS — Z299 Encounter for prophylactic measures, unspecified: Secondary | ICD-10-CM | POA: Diagnosis not present

## 2022-07-23 DIAGNOSIS — C44529 Squamous cell carcinoma of skin of other part of trunk: Secondary | ICD-10-CM | POA: Diagnosis not present

## 2022-07-29 ENCOUNTER — Encounter: Payer: Self-pay | Admitting: *Deleted

## 2022-07-29 ENCOUNTER — Ambulatory Visit: Payer: Self-pay | Admitting: *Deleted

## 2022-07-29 NOTE — Patient Outreach (Signed)
Care Coordination   Follow Up Visit Note   07/29/2022 Name: Jason Woods MRN: [id number] DOB: [birth year]  Jason Woods is a 77 y.o. year old male who sees Vyas, Dhruv B, MD for primary care. I spoke with  Jason Woods by phone today.  What matters to the patients health and wellness today?  Managing blood sugar    Goals Addressed             This Visit's Progress    Manage Diabetes/Blood Sugar   On track    Care Coordination Goals: Patient will follow-up with PCP and/or endocrinologist every 3 months or as recommended Patient will take medication as prescribed and reach out to provider with any negative side effects Patient will continue to monitor and record blood sugar 4 times per day and as needed with Freestyle Libre CGM, and will call PCP or endocrinologist with any readings outside of recommended range Patient will take blood sugar log and meter to provider visits for review Patient will follow a modified carbohydrate diet and decrease simple carbohydrates and sugars Patient will increase activity level as tolerated with an ultimate goal of at least 150 minutes of exercise per week Patient will check feet daily for sores, wounds, calluses, etc and will notify provider of any abnormal findings Patient will have yearly eye exams to check for or monitor diabetic retinopathy Patient will reach out to RN Care Coordinator [phone] with any care coordination or resource needs      COMPLETED: Resolution of Left Heel Wound   On track    Care Coordination Goals: Patient will continue to clean wound with mild soap daily and will apply ointment and cover when going out. Can leave open to air when at home.  Patient will continue to monitor for s/s of infection or worsening wound and will notify provider of any changes Patient will assess feet for other wound, broken skin, blisters, etc and notify provider of any new areas Patient will reach out to RN Care Manager  [phone] with any care coordination or resource needs        SDOH assessments and interventions completed:  Yes  SDOH Interventions Today    Flowsheet Row Most Recent Value  SDOH Interventions   Transportation Interventions Patient Resources (Friends/Family)        Care Coordination Interventions:  Yes, provided  Interventions Today    Flowsheet Row Most Recent Value  Chronic Disease   Chronic disease during today's visit Diabetes  General Interventions   General Interventions Discussed/Reviewed General Interventions Discussed, General Interventions Reviewed, Labs, Annual Foot Exam, Durable Medical Equipment (DME), Annual Eye Exam, Doctor Visits, Health Screening  Labs Hgb A1c every 3 months, Kidney Function  Doctor Visits Discussed/Reviewed Doctor Visits Discussed, Specialist, Doctor Visits Reviewed, Annual Wellness Visits, PCP  Health Screening Prostate  Durable Medical Equipment (DME) Glucomoter  PCP/Specialist Visits Compliance with follow-up visit  Exercise Interventions   Exercise Discussed/Reviewed Physical Activity  Physical Activity Discussed/Reviewed Physical Activity Discussed, Physical Activity Reviewed  Education Interventions   Education Provided Provided Education  Provided Verbal Education On Nutrition, Foot Care, Blood Sugar Monitoring, Labs, When to see the doctor, Medication, Exercise, Mental Health/Coping with Illness  Labs Reviewed Hgb A1c, Kidney Function  Nutrition Interventions   Nutrition Discussed/Reviewed Nutrition Discussed, Nutrition Reviewed, Carbohydrate meal planning, Portion sizes, Increaing proteins  Pharmacy Interventions   Pharmacy Dicussed/Reviewed Pharmacy Topics Discussed, Pharmacy Topics Reviewed, Medications and their functions  [changes in insulin dosage]  Safety Interventions  Safety Discussed/Reviewed Safety Discussed, Safety Reviewed, Home Safety, Fall Risk       Follow up plan: Follow up call scheduled for 09/07/22     Encounter Outcome:  Pt. Visit Completed   Demetrios Loll, BSN, RN-BC RN Care Coordinator [facility]  [facility] Direct Dial: [phone] Main #: [phone]

## 2022-07-31 ENCOUNTER — Emergency Department (HOSPITAL_COMMUNITY): Payer: 59

## 2022-07-31 ENCOUNTER — Encounter (HOSPITAL_COMMUNITY): Payer: Self-pay | Admitting: *Deleted

## 2022-07-31 ENCOUNTER — Emergency Department (HOSPITAL_COMMUNITY)
Admission: EM | Admit: 2022-07-31 | Discharge: 2022-07-31 | Disposition: A | Discharge status: Home / Self Care | Payer: 59 | Attending: Emergency Medicine | Admitting: Emergency Medicine

## 2022-07-31 ENCOUNTER — Other Ambulatory Visit: Payer: Self-pay

## 2022-07-31 DIAGNOSIS — M25572 Pain in left ankle and joints of left foot: Secondary | ICD-10-CM | POA: Insufficient documentation

## 2022-07-31 DIAGNOSIS — Z7984 Long term (current) use of oral hypoglycemic drugs: Secondary | ICD-10-CM | POA: Diagnosis not present

## 2022-07-31 DIAGNOSIS — W06XXXA Fall from bed, initial encounter: Secondary | ICD-10-CM | POA: Insufficient documentation

## 2022-07-31 DIAGNOSIS — Z7901 Long term (current) use of anticoagulants: Secondary | ICD-10-CM | POA: Diagnosis not present

## 2022-07-31 DIAGNOSIS — W19XXXA Unspecified fall, initial encounter: Secondary | ICD-10-CM

## 2022-07-31 DIAGNOSIS — S90922A Unspecified superficial injury of left foot, initial encounter: Secondary | ICD-10-CM | POA: Diagnosis not present

## 2022-07-31 DIAGNOSIS — Z79899 Other long term (current) drug therapy: Secondary | ICD-10-CM | POA: Diagnosis not present

## 2022-07-31 DIAGNOSIS — Z794 Long term (current) use of insulin: Secondary | ICD-10-CM | POA: Insufficient documentation

## 2022-07-31 DIAGNOSIS — Y92009 Unspecified place in unspecified non-institutional (private) residence as the place of occurrence of the external cause: Secondary | ICD-10-CM | POA: Insufficient documentation

## 2022-07-31 MED ORDER — OXYCODONE-ACETAMINOPHEN 5-325 MG PO TABS
2.0000 | ORAL_TABLET | Freq: Once | ORAL | Status: AC
  Administered 2022-07-31: 2 via ORAL
  Filled 2022-07-31: qty 2

## 2022-07-31 NOTE — ED Triage Notes (Signed)
Pt states he fell out of the bed this am reaching for something; pt landed on his left side and left foot; pt has previously broke that foot and is in a boot  Pt has swelling to outer ankle

## 2022-07-31 NOTE — ED Provider Notes (Signed)
EMERGENCY DEPARTMENT AT [facility] Provider Note   CSN: [id number] Arrival date & time: 07/31/22  4098     History  Chief Complaint  Patient presents with   Foot Pain   Fall    Jason Woods is a 77 y.o. male with a history including hypertension, type 2 diabetes, arthritis and had a closed fracture of his left ankle requiring surgical intervention in 2019 presenting for evaluation of left foot pain since falling out of bed today.  He has chronic pain in this left ankle but is worse today since falling out of bed.  He landed on his left side, striking his lateral ankle against the floor.  He reports increased swelling at the site and is concerned about a possible reinjury.  He denies numbness in his toes, denies any other injuries with this fall.  Did not hit his head.  He was awake when this injury occurred, states he was reaching for something on the floor when he rolled out of bed.  The history is provided by the patient.  Fall Pertinent negatives include no headaches.       Home Medications Prior to Admission medications   Medication Sig Start Date End Date Taking? Authorizing Provider  albuterol (VENTOLIN HFA) 108 (90 Base) MCG/ACT inhaler Inhale 1-2 puffs into the lungs every 6 (six) hours as needed for wheezing or shortness of breath. 07/15/21   Smoot, Sarah A, PA-C  ALPRAZolam Prudy Feeler) 1 MG tablet Take 0.5 tablets (0.5 mg total) by mouth 3 (three) times daily. 09/15/20   Johnson, Clanford L, MD  b complex vitamins tablet Take 1 tablet by mouth daily. With C    [provider]  bimatoprost (LUMIGAN) 0.01 % SOLN Place 1 drop into the right eye at bedtime.    [provider]  BREO ELLIPTA 100-25 MCG/ACT AEPB Inhale 1 puff into the lungs daily. 10/31/21   [provider]  cetirizine (ZYRTEC) 10 MG tablet Take 10 mg by mouth daily. 09/06/21   [provider]  Cholecalciferol 50 MCG (2000 UT) TABS Take 2,000 Units by mouth  daily.    [provider]  Continuous Blood Gluc Receiver (FREESTYLE LIBRE 2 READER) DEVI daily. 09/30/21   [provider]  Continuous Blood Gluc Sensor (FREESTYLE LIBRE 2 SENSOR) MISC SMARTSIG:Via Meter Every 10 Days 09/29/21   [provider]  cyanocobalamin 2000 MCG tablet Take 2,000 mcg by mouth daily.    [provider]  docusate sodium (COLACE) 100 MG capsule Take 1 capsule (100 mg total) by mouth 2 (two) times daily. While taking narcotic pain medicine. 12/06/18   Jacinta Shoe, PA-C  dorzolamide (TRUSOPT) 2 % ophthalmic solution Place 1 drop into both eyes 3 (three) times daily.  04/15/17   [provider]  DULoxetine (CYMBALTA) 30 MG capsule Take 30 mg by mouth daily.  10/11/17   [provider]  ELIQUIS 5 MG TABS tablet TAKE 1 TABLET BY MOUTH TWICE DAILY 06/18/22   Antoine Poche, MD  fluticasone [facility]) 50 MCG/ACT nasal spray Place 2 sprays into both nostrils daily.     [provider]  gabapentin (NEURONTIN) 400 MG capsule Take 400 mg by mouth 2 (two) times daily. 09/06/21   [provider]  HUMALOG KWIKPEN 100 UNIT/ML KwikPen Inject 5 Units into the skin in the morning, at noon, and at bedtime. 09/06/21   [provider]  hydrOXYzine (ATARAX) 25 MG tablet Take 25 mg by mouth at  bedtime. 09/06/21   [provider]  insulin glargine (LANTUS) 100 UNIT/ML injection Inject 0.1-0.18 mLs (10-18 Units total) into the skin See admin instructions. Per sliding  16 units in the morning and 8-10 units in the evening 09/15/20   Johnson, Clanford L, MD  ipratropium-albuterol (DUONEB) 0.5-2.5 (3) MG/3ML SOLN Take 3 mLs by nebulization every 6 (six) hours as needed for shortness of breath. 09/03/21   [provider]  ketoconazole (NIZORAL) 2 % cream Apply 1 application topically daily.    [provider]  losartan (COZAAR) 100 MG tablet Take 100 mg by mouth daily. 09/06/21   [provider]  metFORMIN (GLUCOPHAGE) 850 MG tablet Take 850 mg by mouth 2 (two) times daily with a meal.    [provider]  metoprolol succinate (TOPROL-XL) 25 MG 24 hr tablet Take 25 mg by mouth daily. 06/28/18   [provider]  Multiple Vitamin (MULTIVITAMIN WITH MINERALS) TABS tablet Take 1 tablet by mouth daily.    [provider]  oxyCODONE-acetaminophen (PERCOCET/ROXICET) 5-325 MG tablet Take 1 tablet by mouth every 4 (four) hours as needed. 06/23/22   Burgess Amor, PA-C  pantoprazole (PROTONIX) 40 MG tablet Take 40 mg by mouth daily.    [provider]  polyethylene glycol-electrolytes (TRILYTE) 420 g solution Take 4,000 mLs by mouth as directed. 12/12/21   Dolores Frame, MD  rosuvastatin (CRESTOR) 5 MG tablet Take 5 mg by mouth once a week. 09/06/21   [provider]  SYMBICORT 160-4.5 MCG/ACT inhaler Inhale 2 puffs into the lungs in the morning and at bedtime. 07/14/21   [provider]  traZODone (DESYREL) 100 MG tablet Take 200 mg by mouth at bedtime as needed for sleep. 09/08/21   [provider]  triamcinolone cream (KENALOG) 0.1 % Apply 1 application topically daily as needed (affected area(s) on skin).  04/15/17   [provider]  TRULANCE 3 MG TABS Take 1 tablet by mouth daily. 08/25/21   [provider]      Allergies    Lipitor [atorvastatin], Zocor [simvastatin-high dose], Celexa [citalopram hydrobromide], Crestor [rosuvastatin], Mobic [meloxicam], and Prozac [fluoxetine hcl]    Review of Systems   Review of Systems  Constitutional:  Negative for fever.  Gastrointestinal:  Negative for nausea and vomiting.  Musculoskeletal:  Positive for arthralgias and joint swelling. Negative for myalgias.  Neurological:  Negative for dizziness, weakness, numbness and headaches.  All other systems reviewed and are negative.   Physical Exam Updated Vital Signs BP 113/62   Pulse 73   Temp 98.2 F (36.8 C) (Oral)    Resp 16   SpO2 96%  Physical Exam Constitutional:      Appearance: He is well-developed.  HENT:     Head: Atraumatic.  Cardiovascular:     Comments: Pulses equal bilaterally Musculoskeletal:        General: Swelling and tenderness present.     Cervical back: Normal range of motion.     Left ankle: Swelling present. Decreased range of motion.     Left Achilles Tendon: Normal.     Comments: Patient has a chronic left ankle fusion.  Tender to palpation at the left malleolus where there is moderate edema.  He has no knee or proximal fibula pain.  Dorsalis pedis pulses full.  Cap refill in his toes is less than 2 seconds.  Skin:    General: Skin is warm and dry.  Neurological:     Mental Status: He is  alert.     Sensory: No sensory deficit.     Motor: No weakness.     Deep Tendon Reflexes: Reflexes normal.     ED Results / Procedures / Treatments   Labs (all labs ordered are listed, but only abnormal results are displayed) Labs Reviewed - No data to display  EKG None  Radiology DG Ankle Complete Left  Result Date: 07/31/2022 CLINICAL DATA:  Left ankle pain after fall. EXAM: LEFT ANKLE COMPLETE - 3+ VIEW COMPARISON:  June 23, 2022. FINDINGS: Status post surgical arthrodesis of the talotibial and talonavicular joints. Fixation rod is fractured as noted on prior exam. Vascular calcifications are noted. No acute fracture or dislocation is noted. IMPRESSION: Chronic postsurgical changes as described above. Stable appearance of fracture involving fixation rod as noted on prior exam. No acute abnormality seen. Electronically Signed   By: Lupita Raider M.D.   On: 07/31/2022 10:05    Procedures Procedures    Medications Ordered in ED Medications  oxyCODONE-acetaminophen (PERCOCET/ROXICET) 5-325 MG per tablet 2 tablet (2 tablets Oral Given 07/31/22 1007)    ED Course/ Medical Decision Making/ A&P                             Medical Decision Making Patient presenting with left  ankle pain after tumble out of bed, denies any other injuries.  He has chronic disability in this joint secondary to fusion.  Differential diagnosis including fracture versus soft tissue injury.  Amount and/or Complexity of Data Reviewed Radiology: ordered.    Details: X-ray reviewed and discussed with patient.  I agree with film interpretation of no new fractures.  Fixation rod is intact.  Risk OTC drugs. Risk Details: Patient is also asking for refill of his oxycodone until he can be seen by his provider next week.  Review of the [place] narcotic database indicates he received a total of 9010/325 oxycodone tablets on April 15th.  He was advised we cannot provide any further tablets as he should have enough to last him for another 5 days.           Final Clinical Impression(s) / ED Diagnoses Final diagnoses:  Fall in home, initial encounter  Acute left ankle pain    Rx / DC Orders ED Discharge Orders     None         Victoriano Lain 08/01/22 1610    Lonell Grandchild, MD 08/09/22 [phone]

## 2022-07-31 NOTE — Discharge Instructions (Addendum)
Your x-rays are negative for any acute new injury, fracture or dislocation.  You are placed in a postop shoe for comfort.  Plan follow-up with Dr. Victorino Dike as needed if your symptoms do not return to baseline, I do recommend ice and elevation for the next 2 days, you may add a heating pad on day #3 for 20 minutes increments twice daily.

## 2022-08-05 DIAGNOSIS — G894 Chronic pain syndrome: Secondary | ICD-10-CM | POA: Diagnosis not present

## 2022-08-05 DIAGNOSIS — M542 Cervicalgia: Secondary | ICD-10-CM | POA: Diagnosis not present

## 2022-08-05 DIAGNOSIS — M4802 Spinal stenosis, cervical region: Secondary | ICD-10-CM | POA: Diagnosis not present

## 2022-08-05 DIAGNOSIS — Z79899 Other long term (current) drug therapy: Secondary | ICD-10-CM | POA: Diagnosis not present

## 2022-08-05 DIAGNOSIS — M549 Dorsalgia, unspecified: Secondary | ICD-10-CM | POA: Diagnosis not present

## 2022-08-05 DIAGNOSIS — Z79891 Long term (current) use of opiate analgesic: Secondary | ICD-10-CM | POA: Diagnosis not present

## 2022-08-06 DIAGNOSIS — L110 Acquired keratosis follicularis: Secondary | ICD-10-CM | POA: Diagnosis not present

## 2022-08-06 DIAGNOSIS — L609 Nail disorder, unspecified: Secondary | ICD-10-CM | POA: Diagnosis not present

## 2022-08-06 DIAGNOSIS — E1140 Type 2 diabetes mellitus with diabetic neuropathy, unspecified: Secondary | ICD-10-CM | POA: Diagnosis not present

## 2022-08-14 DIAGNOSIS — G9341 Metabolic encephalopathy: Secondary | ICD-10-CM | POA: Diagnosis not present

## 2022-08-14 DIAGNOSIS — R531 Weakness: Secondary | ICD-10-CM | POA: Diagnosis not present

## 2022-08-14 DIAGNOSIS — S82892B Other fracture of left lower leg, initial encounter for open fracture type I or II: Secondary | ICD-10-CM | POA: Diagnosis not present

## 2022-08-14 DIAGNOSIS — E876 Hypokalemia: Secondary | ICD-10-CM | POA: Diagnosis not present

## 2022-08-26 DIAGNOSIS — T8484XA Pain due to internal orthopedic prosthetic devices, implants and grafts, initial encounter: Secondary | ICD-10-CM | POA: Diagnosis not present

## 2022-08-26 DIAGNOSIS — M14671 Charcot's joint, right ankle and foot: Secondary | ICD-10-CM | POA: Diagnosis not present

## 2022-08-26 DIAGNOSIS — E11621 Type 2 diabetes mellitus with foot ulcer: Secondary | ICD-10-CM | POA: Diagnosis not present

## 2022-08-26 DIAGNOSIS — M25572 Pain in left ankle and joints of left foot: Secondary | ICD-10-CM | POA: Diagnosis not present

## 2022-09-02 DIAGNOSIS — M542 Cervicalgia: Secondary | ICD-10-CM | POA: Diagnosis not present

## 2022-09-02 DIAGNOSIS — Z79891 Long term (current) use of opiate analgesic: Secondary | ICD-10-CM | POA: Diagnosis not present

## 2022-09-02 DIAGNOSIS — M4802 Spinal stenosis, cervical region: Secondary | ICD-10-CM | POA: Diagnosis not present

## 2022-09-02 DIAGNOSIS — G894 Chronic pain syndrome: Secondary | ICD-10-CM | POA: Diagnosis not present

## 2022-09-02 DIAGNOSIS — M549 Dorsalgia, unspecified: Secondary | ICD-10-CM | POA: Diagnosis not present

## 2022-09-07 ENCOUNTER — Ambulatory Visit: Payer: Self-pay | Admitting: *Deleted

## 2022-09-07 ENCOUNTER — Encounter: Payer: Self-pay | Admitting: *Deleted

## 2022-09-07 NOTE — Patient Outreach (Signed)
Care Coordination   Follow Up Visit Note   09/07/2022 Name: AVORY BARRETTA MRN: [id number] DOB: 1945/06/13  Antawn TRASK WNEK is a 77 y.o. year old male who sees Vyas, Dhruv B, MD for primary care. I spoke with  [name] by phone today.  What matters to the patients health and wellness today?  Managing blood sugar and pain in ankle    Goals Addressed             This Visit's Progress    Adequately Manage Arthritis Pain in Ankle       Care Coordination Goals: Patient will take medications as prescribed Patient will keep all medical appointments Pain management scheduled for 09/16/22 for evaluation Patient will reach out to provider with any new or worsening symptoms Patient will reach out to RN Care Coordinator at [phone] with any resource or care coordination needs     Manage Diabetes/Blood Sugar   On track    Care Coordination Goals: Patient will follow-up with PCP and/or endocrinologist every 3 months or as recommended Patient will take medication as prescribed and reach out to provider with any negative side effects Patient will continue to monitor and record blood sugar 4 times per day and as needed with Freestyle Libre CGM, and will call PCP or endocrinologist with any readings outside of recommended range Patient will take blood sugar log and meter to provider visits for review Patient will follow a modified carbohydrate diet and decrease simple carbohydrates and sugars Patient will check feet daily for sores, wounds, calluses, etc and will notify provider of any abnormal findings Patient will have yearly eye exams to check for or monitor diabetic retinopathy Patient will reach out to RN Care Coordinator [phone] with any care coordination or resource needs         SDOH assessments and interventions completed:  Yes  SDOH Interventions Today    Flowsheet Row Most Recent Value  SDOH Interventions   Food Insecurity Interventions Intervention Not  Indicated  Transportation Interventions Patient Resources (Friends/Family)  Financial Strain Interventions Intervention Not Indicated  Physical Activity Interventions Patient Declined  [chronic ankle pain after fusion and recently dx with arthritis]        Care Coordination Interventions:  Yes, provided  Interventions Today    Flowsheet Row Most Recent Value  Chronic Disease   Chronic disease during today's visit Diabetes, Other  [Arthritis pain]  General Interventions   General Interventions Discussed/Reviewed General Interventions Discussed, Labs, General Interventions Reviewed, Durable Medical Equipment (DME), Doctor Visits  [Blood sugar this morning read as "low". likely <50. Corrected with breakfast of oatmeal. 172 around lunch. Only incident of <70 in at least 2 weeks. Patient reports only incident in "a pretty good while." Averages 90-130s fasting.]  Labs Hgb A1c every 3 months  Doctor Visits Discussed/Reviewed Doctor Visits Discussed, Doctor Visits Reviewed, PCP, Specialist  Durable Medical Equipment (DME) BP Cuff, Glucomoter, Other  [freestyle libre CGM]  PCP/Specialist Visits Compliance with follow-up visit  [Pain Management 09/16/22, Dr Sherril Croon in July for A1C and f/u]  Exercise Interventions   Exercise Discussed/Reviewed Physical Activity  Physical Activity Discussed/Reviewed Physical Activity Discussed, Physical Activity Reviewed  [limited by arthritis pain in fused ankle]  Education Interventions   Education Provided Provided Education  Provided Verbal Education On Foot Care, Blood Sugar Monitoring, Mental Health/Coping with Illness, When to see the doctor, Other, Medication, Labs  [managing pain]  Labs Reviewed Hgb A1c  [6.4 on 06/18/22 with [facility]. Abstracted into  EMR.]  Nutrition Interventions   Nutrition Discussed/Reviewed Nutrition Discussed, Nutrition Reviewed  Pharmacy Interventions   Pharmacy Dicussed/Reviewed Pharmacy Topics Discussed, Pharmacy Topics  Reviewed, Medications and their functions  [reviewd and updated medicatio list to reflect dosage of insulin currenty prescribed and taking]  Safety Interventions   Safety Discussed/Reviewed Safety Discussed, Safety Reviewed, Fall Risk, Home Safety  Home Safety Assistive Devices       Follow up plan: Follow up call scheduled for 10/06/22    Encounter Outcome:  Pt. Visit Completed   Demetrios Loll, BSN, RN-BC RN Care Coordinator [facility]  [facility] Direct Dial: [phone] Main #: [phone]

## 2022-09-14 DIAGNOSIS — G9341 Metabolic encephalopathy: Secondary | ICD-10-CM | POA: Diagnosis not present

## 2022-09-14 DIAGNOSIS — R531 Weakness: Secondary | ICD-10-CM | POA: Diagnosis not present

## 2022-09-14 DIAGNOSIS — E876 Hypokalemia: Secondary | ICD-10-CM | POA: Diagnosis not present

## 2022-09-14 DIAGNOSIS — S82892B Other fracture of left lower leg, initial encounter for open fracture type I or II: Secondary | ICD-10-CM | POA: Diagnosis not present

## 2022-09-25 DIAGNOSIS — I152 Hypertension secondary to endocrine disorders: Secondary | ICD-10-CM | POA: Diagnosis not present

## 2022-09-25 DIAGNOSIS — E1159 Type 2 diabetes mellitus with other circulatory complications: Secondary | ICD-10-CM | POA: Diagnosis not present

## 2022-09-25 DIAGNOSIS — J329 Chronic sinusitis, unspecified: Secondary | ICD-10-CM | POA: Diagnosis not present

## 2022-09-25 DIAGNOSIS — Z299 Encounter for prophylactic measures, unspecified: Secondary | ICD-10-CM | POA: Diagnosis not present

## 2022-09-25 DIAGNOSIS — Z794 Long term (current) use of insulin: Secondary | ICD-10-CM | POA: Diagnosis not present

## 2022-09-25 DIAGNOSIS — I10 Essential (primary) hypertension: Secondary | ICD-10-CM | POA: Diagnosis not present

## 2022-09-30 DIAGNOSIS — M549 Dorsalgia, unspecified: Secondary | ICD-10-CM | POA: Diagnosis not present

## 2022-09-30 DIAGNOSIS — M4802 Spinal stenosis, cervical region: Secondary | ICD-10-CM | POA: Diagnosis not present

## 2022-09-30 DIAGNOSIS — Z79891 Long term (current) use of opiate analgesic: Secondary | ICD-10-CM | POA: Diagnosis not present

## 2022-09-30 DIAGNOSIS — Z79899 Other long term (current) drug therapy: Secondary | ICD-10-CM | POA: Diagnosis not present

## 2022-09-30 DIAGNOSIS — G894 Chronic pain syndrome: Secondary | ICD-10-CM | POA: Diagnosis not present

## 2022-09-30 DIAGNOSIS — M542 Cervicalgia: Secondary | ICD-10-CM | POA: Diagnosis not present

## 2022-10-06 ENCOUNTER — Ambulatory Visit: Payer: Self-pay | Admitting: *Deleted

## 2022-10-06 ENCOUNTER — Encounter: Payer: Self-pay | Admitting: *Deleted

## 2022-10-06 NOTE — Patient Outreach (Signed)
Care Coordination   Follow Up Visit Note   10/06/2022 Name: Jason Woods MRN: [id number] DOB: 1945/12/10  Jason Woods is a 77 y.o. year old male who sees Vyas, Dhruv B, MD for primary care. I spoke with  Jason Woods by phone today.  What matters to the patients health and wellness today?  Managing blood sugar and concern about brother's current hospitalization and his level of care needs once he returns home    Goals Addressed             This Visit's Progress    Manage Diabetes/Blood Sugar   On track    Care Coordination Goals: Patient will follow-up with PCP every 3 months or as recommended Patient will take medication as prescribed and reach out to provider with any negative side effects Patient will continue to monitor and record blood sugar 4 times per day and as needed with Freestyle Libre CGM, and will call PCP with any readings outside of recommended range Patient will take blood sugar log and meter to provider visits for review Patient will continue to follow a modified carbohydrate diet and decrease simple carbohydrates and sugars Patient will follow-up for retinal eye exam yearly Patient will follow-up with Jason Jason Woods, podiatrist, every 3-4 months for toenail trimming and foot exam Patient will reach out to RN Care Coordinator [phone] with any care coordination or resource needs         SDOH assessments and interventions completed:  Yes  SDOH Interventions Today    Flowsheet Row Most Recent Value  SDOH Interventions   Transportation Interventions Patient Resources (Friends/Family)  Financial Strain Interventions Intervention Not Indicated        Care Coordination Interventions:  Yes, provided  Interventions Today    Flowsheet Row Most Recent Value  Chronic Disease   Chronic disease during today's visit Diabetes  General Interventions   General Interventions Discussed/Reviewed General Interventions Discussed, General Interventions  Reviewed, Labs, Durable Medical Equipment (DME), Doctor Visits, Sick Day Rules, Annual Foot Exam, Annual Eye Exam  [Blood sugar reading this morning was 134]  Labs Hgb A1c every 3 months  Doctor Visits Discussed/Reviewed Doctor Visits Discussed, Doctor Visits Reviewed, PCP, Specialist  Durable Medical Equipment (DME) Glucomoter  Basilia Jumbo libre CGM]  PCP/Specialist Visits Compliance with follow-up visit  [Jason Jason Woods every 3-4 months for foot exam and toenal trimming, Jason Woods for eye exam yearly. PCP appt scheduled for tomorrow.]  Exercise Interventions   Exercise Discussed/Reviewed Physical Activity, Assistive device use and maintanence  [uses cane when he goes outside or to town. Able to get around his home without it. 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 Discussed transportation benefit through insurance and RCATs. Scheduled w/ PCP tomorrow for cough & congestion x 3 wks. Finished antibiotics. 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 Lives with brother but he has been in the hosp for about a month. Concerned about his  level of care needs once discharged.]  Nutrition Interventions   Nutrition Discussed/Reviewed Nutrition Discussed, Nutrition Reviewed, Carbohydrate meal planning, Portion sizes, Decreasing sugar intake, Increasing  proteins, Adding fruits and vegetables, Fluid intake  [Watches sugar and simple carb intake. Reminded to limit carbs to 30 grams per meal and snacks to less than 15 grams of carbs.]  Pharmacy Interventions   Pharmacy Dicussed/Reviewed Pharmacy Topics Discussed, Pharmacy Topics Reviewed, Medications and their functions  [Takins as directed with no concerns. Completed amoxicillin and prednisone for URI. Blood sugar has been a little higher while on prednisone. Pt aware of this side effect.]  Safety Interventions   Safety Discussed/Reviewed Safety Discussed, Safety Reviewed, Fall Risk, Home Safety  Home Safety Assistive Devices  [hx of falls. Uses a cane when outside or out of the home. Will use around the home at times. Fall precautions discussed.]       Follow up plan: Follow up call scheduled for 11/05/22    Encounter Outcome:  Pt. Visit Completed   Jason Woods, BSN, RN-BC RN Care Coordinator [facility]  [facility] Direct Dial: [phone] Main #: [phone]

## 2022-10-07 DIAGNOSIS — R0981 Nasal congestion: Secondary | ICD-10-CM | POA: Diagnosis not present

## 2022-10-07 DIAGNOSIS — F17210 Nicotine dependence, cigarettes, uncomplicated: Secondary | ICD-10-CM | POA: Diagnosis not present

## 2022-10-07 DIAGNOSIS — J069 Acute upper respiratory infection, unspecified: Secondary | ICD-10-CM | POA: Diagnosis not present

## 2022-10-07 DIAGNOSIS — R059 Cough, unspecified: Secondary | ICD-10-CM | POA: Diagnosis not present

## 2022-10-07 DIAGNOSIS — Z299 Encounter for prophylactic measures, unspecified: Secondary | ICD-10-CM | POA: Diagnosis not present

## 2022-10-14 DIAGNOSIS — E876 Hypokalemia: Secondary | ICD-10-CM | POA: Diagnosis not present

## 2022-10-14 DIAGNOSIS — S82892B Other fracture of left lower leg, initial encounter for open fracture type I or II: Secondary | ICD-10-CM | POA: Diagnosis not present

## 2022-10-14 DIAGNOSIS — G9341 Metabolic encephalopathy: Secondary | ICD-10-CM | POA: Diagnosis not present

## 2022-10-14 DIAGNOSIS — R531 Weakness: Secondary | ICD-10-CM | POA: Diagnosis not present

## 2022-10-22 DIAGNOSIS — I10 Essential (primary) hypertension: Secondary | ICD-10-CM | POA: Diagnosis not present

## 2022-10-22 DIAGNOSIS — D6869 Other thrombophilia: Secondary | ICD-10-CM | POA: Diagnosis not present

## 2022-10-22 DIAGNOSIS — E1139 Type 2 diabetes mellitus with other diabetic ophthalmic complication: Secondary | ICD-10-CM | POA: Diagnosis not present

## 2022-10-22 DIAGNOSIS — H548 Legal blindness, as defined in USA: Secondary | ICD-10-CM | POA: Diagnosis not present

## 2022-10-22 DIAGNOSIS — Z299 Encounter for prophylactic measures, unspecified: Secondary | ICD-10-CM | POA: Diagnosis not present

## 2022-10-22 DIAGNOSIS — E1165 Type 2 diabetes mellitus with hyperglycemia: Secondary | ICD-10-CM | POA: Diagnosis not present

## 2022-10-22 DIAGNOSIS — F17210 Nicotine dependence, cigarettes, uncomplicated: Secondary | ICD-10-CM | POA: Diagnosis not present

## 2022-10-26 DIAGNOSIS — M542 Cervicalgia: Secondary | ICD-10-CM | POA: Diagnosis not present

## 2022-10-26 DIAGNOSIS — G894 Chronic pain syndrome: Secondary | ICD-10-CM | POA: Diagnosis not present

## 2022-10-26 DIAGNOSIS — Z79891 Long term (current) use of opiate analgesic: Secondary | ICD-10-CM | POA: Diagnosis not present

## 2022-10-26 DIAGNOSIS — M549 Dorsalgia, unspecified: Secondary | ICD-10-CM | POA: Diagnosis not present

## 2022-10-28 DIAGNOSIS — E11610 Type 2 diabetes mellitus with diabetic neuropathic arthropathy: Secondary | ICD-10-CM | POA: Diagnosis not present

## 2022-10-28 DIAGNOSIS — M179 Osteoarthritis of knee, unspecified: Secondary | ICD-10-CM | POA: Diagnosis not present

## 2022-11-05 ENCOUNTER — Encounter: Payer: Self-pay | Admitting: *Deleted

## 2022-11-05 ENCOUNTER — Ambulatory Visit: Payer: Self-pay | Admitting: *Deleted

## 2022-11-05 NOTE — Patient Outreach (Signed)
Care Coordination   Follow Up Visit Note   11/05/2022 Name: Jason Woods MRN: [id number] DOB: 03/20/1946  Jason Woods is a 77 y.o. year old male who sees Vyas, Dhruv B, MD for primary care. I spoke with  Jason Woods by phone today.  What matters to the patients health and wellness today?  Managing blood sugar    Goals Addressed             This Visit's Progress    COMPLETED: Adequately Manage Arthritis Pain in Ankle       Care Coordination Goals: Patient will take medications as prescribed Patient will keep all medical appointments Pain management scheduled for 09/16/22 for evaluation Patient will reach out to provider with any new or worsening symptoms Patient will reach out to RN Care Coordinator at [phone] with any resource or care coordination needs  Pain is well managed with prescription medication. No follow-up needed.     Manage Diabetes/Blood Sugar   On track    Care Coordination Goals: Patient will take medication as prescribed and reach out to provider with any negative side effects Patient will continue to monitor and record blood sugar 4 times per day and as needed with Freestyle Woods CGM, and will call PCP with any readings less than 70 or above 200 Patient will take blood sugar log and meter to provider visits for review Patient will continue to follow a modified carbohydrate with 3 meals per day that contain 30GM of carbohydrates and up to 2 snacks with less than 15GM of carbohydrates Patient will reach out to RN Care Coordinator [phone] with any care coordination or resource needs      Manage Feelings About Brother's Illness       Care Coordination Goals: Patient will talk or visit with sister/family or neighbors daily Patient will consider talking with LCSW Re: sadness and potential depression related to his brother's illness and hospitalization and uncertainty about whether he will return home Patient and brother lived together  until his brother's recent hospitalization Patient will consider joining the [facility] in [place], the [facility], or returning to church for increased socialization and to get out of the house more often Patient will reach out to [employer] at [phone] with any resource or care coordination needs        SDOH assessments and interventions completed:  Yes  SDOH Interventions Today    Flowsheet Row Most Recent Value  SDOH Interventions   Transportation Interventions Patient Resources (Friends/Family)  Physical Activity Interventions Other (Comments), [employer]  [Walking up and down road with cane once a day. Trouble with balance. No falls.]  Social Connections Interventions [employer]  [Recommended senior center programs]        Care Coordination Interventions:  Yes, provided  Interventions Today    Flowsheet Row Most Recent Value  Chronic Disease   Chronic disease during today's visit Diabetes  General Interventions   General Interventions Discussed/Reviewed Durable Medical Equipment (DME), General Interventions Discussed, General Interventions Reviewed, Doctor Visits  Doctor Visits Discussed/Reviewed Doctor Visits Discussed, PCP, Annual Wellness Visits, Doctor Visits Reviewed, Specialist  Durable Medical Equipment (DME) Glucomoter  Jason Woods. Blood sugar 190 before bed last night. Woke up with low blood sugar during the night. Jason Woods alarmed but unsure of reading. Ate [employer]ese[employer] cup. Blood sugar 69 this morning when he got up. After breakfast it was 140]  PCP/Specialist Visits Compliance with follow-up visit  [Follow-up with PCP every 3 months]  Exercise Interventions   Exercise Discussed/Reviewed Exercise Discussed, Exercise Reviewed, Physical Activity  Physical Activity Discussed/Reviewed Physical Activity Discussed  [walks for 20 minutes daily. Encouraged to increase activity as tolerated with a goal of 150 minutes per week.]  Education Interventions    Education Provided Provided Education  Provided Verbal Education On Nutrition, Blood Sugar Monitoring, Exercise, Medication, When to see the doctor  Mental Health Interventions   Mental Health Discussed/Reviewed Mental Health Discussed, Mental Health Reviewed, Refer to Social Work for counseling  [Lives with brother but his brother has been in the hospital and patient doesn't expect that he will return home. He has been sad because of this. Encouraged to maintain socialization. Talk with family and friends daily. [employer] [facility] recommended]  Refer to Social Work for counseling regarding Anxiety/Coping, Other  [possible depression. Sadness related to brother's poor health and possibility that he won't be able to come home from hospital.]  Nutrition Interventions   Nutrition Discussed/Reviewed Nutrition Discussed, Nutrition Reviewed, Carbohydrate meal planning, Portion sizes  [Eat 3 meals per day with 30GM of CHO and up to 2 snacks per day with less than 15GM of CHO.]  Pharmacy Interventions   Pharmacy Dicussed/Reviewed Pharmacy Topics Discussed, Pharmacy Topics Reviewed, Medications and their functions  [taking medications regularly. Reviewed insulin dosing instructions.]  Safety Interventions   Safety Discussed/Reviewed Safety Discussed, Safety Reviewed, Fall Risk, Home Safety  Home Safety Assistive Devices       Follow up plan: Follow up call scheduled for 11/20/22    Encounter Outcome:  Pt. Visit Completed   Demetrios Loll, BSN, RN-BC RN Care Coordinator [facility]  [facility] Direct Dial: [phone] Main #: [phone]

## 2022-11-14 DIAGNOSIS — E876 Hypokalemia: Secondary | ICD-10-CM | POA: Diagnosis not present

## 2022-11-14 DIAGNOSIS — S82892B Other fracture of left lower leg, initial encounter for open fracture type I or II: Secondary | ICD-10-CM | POA: Diagnosis not present

## 2022-11-14 DIAGNOSIS — G9341 Metabolic encephalopathy: Secondary | ICD-10-CM | POA: Diagnosis not present

## 2022-11-14 DIAGNOSIS — R531 Weakness: Secondary | ICD-10-CM | POA: Diagnosis not present

## 2022-11-16 DIAGNOSIS — R0981 Nasal congestion: Secondary | ICD-10-CM | POA: Diagnosis not present

## 2022-11-16 DIAGNOSIS — Z299 Encounter for prophylactic measures, unspecified: Secondary | ICD-10-CM | POA: Diagnosis not present

## 2022-11-16 DIAGNOSIS — J069 Acute upper respiratory infection, unspecified: Secondary | ICD-10-CM | POA: Diagnosis not present

## 2022-11-16 DIAGNOSIS — F17210 Nicotine dependence, cigarettes, uncomplicated: Secondary | ICD-10-CM | POA: Diagnosis not present

## 2022-11-16 DIAGNOSIS — R059 Cough, unspecified: Secondary | ICD-10-CM | POA: Diagnosis not present

## 2022-11-20 ENCOUNTER — Ambulatory Visit: Payer: Self-pay | Admitting: *Deleted

## 2022-11-20 NOTE — Patient Outreach (Signed)
  Care Coordination   11/20/2022 Name: Jason Woods MRN: [id number] DOB: 05-Mar-1946   Care Coordination Outreach Attempts:  An unsuccessful telephone outreach was attempted for a scheduled appointment today.  Follow Up Plan:  Additional outreach attempts will be made to offer the patient care coordination information and services.   Encounter Outcome:  No Answer. Left HIPAA compliant VM.   Care Coordination Interventions:  No, not indicated    Demetrios Loll, RN, BSN RN Care Coordinator [facility]  [facility] Direct Dial: [phone] Main #: [phone]

## 2022-11-25 DIAGNOSIS — L110 Acquired keratosis follicularis: Secondary | ICD-10-CM | POA: Diagnosis not present

## 2022-11-25 DIAGNOSIS — L609 Nail disorder, unspecified: Secondary | ICD-10-CM | POA: Diagnosis not present

## 2022-11-25 DIAGNOSIS — E1140 Type 2 diabetes mellitus with diabetic neuropathy, unspecified: Secondary | ICD-10-CM | POA: Diagnosis not present

## 2022-11-26 ENCOUNTER — Ambulatory Visit: Payer: Self-pay | Admitting: *Deleted

## 2022-11-26 NOTE — Patient Outreach (Signed)
  Care Coordination   11/26/2022  Name: Jason Woods MRN: [id number] DOB: June 28, 1945   Care Coordination Outreach Attempts:  An unsuccessful telephone outreach was attempted today to offer the patient information about available care coordination services. HIPAA compliant messages left on voicemail, providing contact information for CSW, encouraging patient to return CSW's call at his earliest convenience.  Follow Up Plan:  Additional outreach attempts will be made to offer the patient care coordination information and services.   Encounter Outcome:  No Answer.   Care Coordination Interventions:  No, not indicated.    [name], BSW, MSW, Printmaker Social Work Case Set designer Health  [facility], Population Health Direct Dial: [phone]  Fax: [phone] Email: [email or web address] Website: [email or web address]

## 2022-11-27 ENCOUNTER — Ambulatory Visit: Payer: Self-pay | Admitting: *Deleted

## 2022-11-27 ENCOUNTER — Encounter: Payer: Self-pay | Admitting: *Deleted

## 2022-11-29 NOTE — Patient Instructions (Signed)
Visit Information  Thank you for taking time to visit with me today. Please don't hesitate to contact me if I can be of assistance to you.   Following are the goals we discussed today:   Goals Addressed               This Visit's Progress     Find Help in My Community. (pt-stated)   On track     Care Coordination Interventions:  Interventions Today    Flowsheet Row Most Recent Value  Chronic Disease   Chronic disease during today's visit Diabetes, Atrial Fibrillation (AFib), Hypertension (HTN), Chronic Obstructive Pulmonary Disease (COPD), Other, Chronic Kidney Disease/End Stage Renal Disease (ESRD)  [Acute Hypoxemic Respiratory Failure, Dysphagia, Inability to Perform Activities of Daily Living Independently, Cigarette Smoker, & Moderate Protein-Calorie Malnutrition.]  General Interventions   General Interventions Discussed/Reviewed General Interventions Discussed, Labs, Vaccines, Health Screening, Annual Foot Exam, General Interventions Reviewed, Lipid Profile, Community Resources, Horticulturist, commercial (DME), Annual Eye Exam, Level of Care, Communication with, Referral to Nurse, Doctor Visits  [Encouraged]  Labs Hgb A1c every 3 months, Kidney Function  [Encouraged]  Vaccines COVID-19, Tetanus/Pertussis/Diphtheria, Shingles, RSV, Pneumonia, Flu  [Encouraged]  Doctor Visits Discussed/Reviewed Doctor Visits Discussed, Specialist, Doctor Visits Reviewed, Annual Wellness Visits, PCP  [Encouraged]  Health Screening Bone Density, Colonoscopy, Prostate  [Encouraged]  Durable Medical Equipment (DME) BP Cuff, Glucomoter, Other  [Encouraged]  PCP/Specialist Visits Compliance with follow-up visit, Contact provider for referral to  [Encouraged]  Contacted provider for referral to --  [N/A]  Communication with PCP/Specialists, RN, Pharmacists, Social Work  [Encouraged]  Level of Care Adult Daycare, Air traffic controller, Assisted Living, Skilled Holiday representative, Personal Care Services  [Encouraged]   Applications Medicaid, Personal Care Services, FL-2  [Encouraged]  Exercise Interventions   Exercise Discussed/Reviewed Exercise Discussed, Assistive device use and maintanence, [name] Managment, Physical Activity, Exercise Reviewed  [Encouraged]  Physical Activity Discussed/Reviewed Physical Activity Discussed, Home Exercise Program (HEP), PREP, Physical Activity Reviewed, Gym, Types of exercise  [Encouraged]  Weight Management Weight maintenance  [Encouraged]  Education Interventions   Education Provided Provided Therapist, sports, Provided Web-based Education, Provided Education  [Encouraged]  Provided Arboriculturist, When to see the doctor, Mental Health/Coping with Illness, Nutrition, Foot Care, Eye Care, Labs, Blood Sugar Monitoring, Medication, Exercise, Applications, [employer] Resources  [Encouraged]  Labs Reviewed --  [N/A]  Applications Medicaid, Personal Care Services, FL-2  [Encouraged]  Mental Health Interventions   Mental Health Discussed/Reviewed Other, Crisis, Suicide, Coping Strategies, Substance Abuse, Grief and Loss, Mental Health Reviewed, Depression, Anxiety, Mental Health Discussed  [Domestic Violence]  Nutrition Interventions   Nutrition Discussed/Reviewed Nutrition Discussed, Adding fruits and vegetables, Increasing proteins, Decreasing fats, Decreasing salt, Supplemental nutrition, Decreasing sugar intake, Portion sizes, Carbohydrate meal planning, Nutrition Reviewed, Fluid intake  [Encouraged]  Pharmacy Interventions   Pharmacy Dicussed/Reviewed Pharmacy Topics Discussed, Medications and their functions, Pharmacy Topics Reviewed, Medication Adherence, Affording Medications  [Encouraged]  Medication Adherence --  [N/A]  Safety Interventions   Safety Discussed/Reviewed Safety Discussed, Safety Reviewed  [Encouraged]  Advanced Directive Interventions   Advanced Directives Discussed/Reviewed Advanced Directives Discussed, Advanced Directives Reviewed   [Completed]      Assessed Social Determinant of Health Barriers. Discussed Plans for Ongoing Care Management Follow Up. Provided Careers information officer Information for Care Management Team Members. Screened for Signs & Symptoms of Depression, Related to Chronic Disease State.  PHQ2 & PHQ9 Depression Screen Completed & Results Reviewed.  Suicidal Ideation & Homicidal Ideation Assessed - None Present.  Domestic Violence Assessed - None Present. Access to Weapons Assessed - None Present.   Active Listening & Reflection Utilized.  Verbalization of Feelings Encouraged.  Emotional Support Provided. Feelings of Anxiety & Worry Validated. Symptoms of Stress Acknowledged. Crisis Support Information, Agencies, Services, & Resources Discussed. Problem Solving Interventions Identified. Task-Centered Solutions Implemented.   Solution-Focused Strategies Developed. Acceptance & Commitment Therapy Introduced. Brief Cognitive Behavioral Therapy Initiated. Client-Centered Therapy Enacted. Reviewed Prescription Medications & Discussed Importance of Compliance. Encouraged Administration of Medications, Exactly as Prescribed. Quality of Sleep Assessed & Sleep Hygiene Techniques Promoted. Encouraged Increased Level of Activity & Exercise, as Tolerated. Emphasized Importance of Quitting Smoking, Providing Smoking Cessation Classes, Services, Agencies, & Resources in [place], Offering to Assist with Referral Process. Encouraged Self-Enrollment in Smoking Cessation Classes, Services, Agencies, & Resources of Interest in [place], from List Provided, in An Effort to Improve Overall Health & Well-Being. Encouraged Self-Enrollment in [employer], Services, & Resources of Interest in [place], from List Provided, to Acupuncturist for Psychotropic Medication Administration & Management, in An Effort to Reduce & Manage Symptoms of Anxiety & Depression. Encouraged Self-Enrollment  in [employer], Services, & Resources of Interest in [place], from List Provided, to Engineer, water for Psychotherapeutic Counseling & Supportive Services, in An Effort to Reduce & Manage Symptoms of Anxiety & Depression. Discussed Higher Level of Care Placement Options (I.e. Assisted Living, Senior Living, Extended Care, Intermediate Level Care, Etc.) & Encouraged Consideration. Verified Active Insurance Coverage through [employer] Dual Complete & [employer]. Confirmed CAP/DA Civil engineer, contracting for Disabled Adults) Services in [place], with [facility] Department of Health & Human Services/Medicaid Division of Health Benefits [phone]), 80 Hours Per Month, through [facility] [phone]). CSW Collaboration with Shanda Bumps, CAP/DA Civil engineer, contracting for Disabled Adults) Case Worker, with The [employer] of Health & Human Services/Medicaid Division of Health Benefits [phone]), to [employer] Are Second on Waiting List to Receive Increased Hours of CAP In-Home [employer] Per Month. CSW Collaboration with Kristin Bruins, Medicaid Case Worker with [facility] of Social Services [phone]), to Request Assistance in Determining Eligibility for An Increase in Amount of Food Stamps Received Each Month. Encouraged Use of Transportation Benefits through [employer] Dual [employer], [employer] (# [phone]) & [employer] [phone]). Encouraged Utilization of Life Alert System at All Times While at Home. Encouraged Acceptance of Follow-Up Outreach Call with Demetrios Loll, Nurse Care Coordinator with [facility] [phone]), Scheduled on 12/02/2022 at 11:00 AM.  Encouraged Contact with CSW (# [phone]), if You Have Questions, Need Assistance, or If Additional  Social Work Needs Are Identified Between Now & Our Next Follow-Up Outreach Call, Scheduled on 12/14/2022 at 10:30 AM.      Our next appointment is by telephone on 12/14/2022 at 10:30 am.  Please call the care guide team at [phone] if you need to cancel or reschedule your appointment.   If you are experiencing a Mental Health or Behavioral Health Crisis or need someone to talk to, please call the Suicide and Crisis Lifeline: 988 call the [place] National Suicide Prevention Lifeline: (478)552-4838 or TTY: 458-291-6820 TTY (838)491-7777) to talk to a trained counselor call 1-800-273-TALK (toll free, 24 hour hotline) go to [facility] [address], [place] [phone]) call the [facility] Crisis Line: [phone] call 911  Patient verbalizes understanding of instructions and care  plan provided today and agrees to view in MyChart. Active MyChart status and patient understanding of how to access instructions and care plan via MyChart confirmed with patient.     Telephone follow up appointment with care management team member scheduled for:   12/14/2022 at 10:30 am.  [name], BSW, MSW, LCSW  Embedded Practice Social Work Case Manager  [facility], Population Health Direct Dial: [phone]  Fax: [phone] Email: [email or web address] Website: [email or web address]

## 2022-11-29 NOTE — Patient Outreach (Signed)
Care Coordination   Initial Visit Note   11/29/2022  Name: Jason Woods MRN: [id number] DOB: Jun 11, 1945  Jason Woods is a 77 y.o. year old male who sees Vyas, Dhruv B, MD for primary care. I spoke with Jason Fus by phone today.  What matters to the patients health and wellness today? Find Help in My Community.   Goals Addressed               This Visit's Progress     Find Help in My Community. (pt-stated)   On track     Care Coordination Interventions:  Interventions Today    Flowsheet Row Most Recent Value  Chronic Disease   Chronic disease during today's visit Diabetes, Atrial Fibrillation (AFib), Hypertension (HTN), Chronic Obstructive Pulmonary Disease (COPD), Other, Chronic Kidney Disease/End Stage Renal Disease (ESRD)  [Acute Hypoxemic Respiratory Failure, Dysphagia, Inability to Perform Activities of Daily Living Independently, Cigarette Smoker, & Moderate Protein-Calorie Malnutrition.]  General Interventions   General Interventions Discussed/Reviewed General Interventions Discussed, Labs, Vaccines, Health Screening, Annual Foot Exam, General Interventions Reviewed, Lipid Profile, Community Resources, Horticulturist, commercial (DME), Annual Eye Exam, Level of Care, Communication with, Referral to Nurse, Doctor Visits  [Encouraged]  Labs Hgb A1c every 3 months, Kidney Function  [Encouraged]  Vaccines COVID-19, Tetanus/Pertussis/Diphtheria, Shingles, RSV, Pneumonia, Flu  [Encouraged]  Doctor Visits Discussed/Reviewed Doctor Visits Discussed, Specialist, Doctor Visits Reviewed, Annual Wellness Visits, PCP  [Encouraged]  Health Screening Bone Density, Colonoscopy, Prostate  [Encouraged]  Durable Medical Equipment (DME) BP Cuff, Glucomoter, Other  [Encouraged]  PCP/Specialist Visits Compliance with follow-up visit, Contact provider for referral to  [Encouraged]  Contacted provider for referral to --  [N/A]  Communication with PCP/Specialists, RN, Pharmacists,  Social Work  [Encouraged]  Level of Care Adult Daycare, Air traffic controller, Assisted Living, Skilled Holiday representative, Personal Care Services  [Encouraged]  Applications Medicaid, Personal Care Services, FL-2  [Encouraged]  Exercise Interventions   Exercise Discussed/Reviewed Exercise Discussed, Assistive device use and maintanence, [name] Managment, Physical Activity, Exercise Reviewed  [Encouraged]  Physical Activity Discussed/Reviewed Physical Activity Discussed, Home Exercise Program (HEP), PREP, Physical Activity Reviewed, Gym, Types of exercise  [Encouraged]  Weight Management Weight maintenance  [Encouraged]  Education Interventions   Education Provided Provided Therapist, sports, Provided Web-based Education, Provided Education  [Encouraged]  Provided Arboriculturist, When to see the doctor, Mental Health/Coping with Illness, Nutrition, Foot Care, Eye Care, Labs, Blood Sugar Monitoring, Medication, Exercise, Applications, [employer] Resources  [Encouraged]  Labs Reviewed --  [N/A]  Applications Medicaid, Personal Care Services, FL-2  [Encouraged]  Mental Health Interventions   Mental Health Discussed/Reviewed Other, Crisis, Suicide, Coping Strategies, Substance Abuse, Grief and Loss, Mental Health Reviewed, Depression, Anxiety, Mental Health Discussed  [Domestic Violence]  Nutrition Interventions   Nutrition Discussed/Reviewed Nutrition Discussed, Adding fruits and vegetables, Increasing proteins, Decreasing fats, Decreasing salt, Supplemental nutrition, Decreasing sugar intake, Portion sizes, Carbohydrate meal planning, Nutrition Reviewed, Fluid intake  [Encouraged]  Pharmacy Interventions   Pharmacy Dicussed/Reviewed Pharmacy Topics Discussed, Medications and their functions, Pharmacy Topics Reviewed, Medication Adherence, Affording Medications  [Encouraged]  Medication Adherence --  [N/A]  Safety Interventions   Safety Discussed/Reviewed Safety Discussed, Safety Reviewed   [Encouraged]  Advanced Directive Interventions   Advanced Directives Discussed/Reviewed Advanced Directives Discussed, Advanced Directives Reviewed  [Completed]      Assessed Social Determinant of Health Barriers. Discussed Plans for Ongoing Care Management Follow Up. Provided Careers information officer Information for Care Management Team Members. Screened for Signs & Symptoms of  Depression, Related to Chronic Disease State.  PHQ2 & PHQ9 Depression Screen Completed & Results Reviewed.  Suicidal Ideation & Homicidal Ideation Assessed - None Present.   Domestic Violence Assessed - None Present. Access to Weapons Assessed - None Present.   Active Listening & Reflection Utilized.  Verbalization of Feelings Encouraged.  Emotional Support Provided. Feelings of Anxiety & Worry Validated. Symptoms of Stress Acknowledged. Crisis Support Information, Agencies, Services, & Resources Discussed. Problem Solving Interventions Identified. Task-Centered Solutions Implemented.   Solution-Focused Strategies Developed. Acceptance & Commitment Therapy Introduced. Brief Cognitive Behavioral Therapy Initiated. Client-Centered Therapy Enacted. Reviewed Prescription Medications & Discussed Importance of Compliance. Encouraged Administration of Medications, Exactly as Prescribed. Quality of Sleep Assessed & Sleep Hygiene Techniques Promoted. Encouraged Increased Level of Activity & Exercise, as Tolerated. Emphasized Importance of Quitting Smoking, Providing Smoking Cessation Classes, Services, Agencies, & Resources in [place], Offering to Assist with Referral Process. Encouraged Self-Enrollment in Smoking Cessation Classes, Services, Agencies, & Resources of Interest in [place], from List Provided, in An Effort to Improve Overall Health & Well-Being. Encouraged Self-Enrollment in [employer], Services, & Resources of Interest in [place], from List Provided, to Acupuncturist  for Psychotropic Medication Administration & Management, in An Effort to Reduce & Manage Symptoms of Anxiety & Depression. Encouraged Self-Enrollment in [employer], Services, & Resources of Interest in [place], from List Provided, to Engineer, water for Psychotherapeutic Counseling & Supportive Services, in An Effort to Reduce & Manage Symptoms of Anxiety & Depression. Discussed Higher Level of Care Placement Options (I.e. Assisted Living, Senior Living, Extended Care, Intermediate Level Care, Etc.) & Encouraged Consideration. Verified Active Insurance Coverage through [employer] Dual Complete & [employer]. Confirmed CAP/DA Civil engineer, contracting for Disabled Adults) Services in [place], with [facility] Department of Health & Human Services/Medicaid Division of Health Benefits [phone]), 80 Hours Per Month, through [facility] [phone]). CSW Collaboration with Shanda Bumps, CAP/DA Civil engineer, contracting for Disabled Adults) Case Worker, with [employer] of Health & Human Services/Medicaid Division of Health Benefits [phone]), to [employer] Are Second on Waiting List to Receive Increased Hours of CAP In-Home [employer] Per Month. CSW Collaboration with Kristin Bruins, Medicaid Case Worker with [facility] of Social Services [phone]), to Request Assistance in Determining Eligibility for An Increase in Amount of Food Stamps Received Each Month. Encouraged Use of Transportation Benefits through [employer], [employer] (# [phone]) & [employer] [phone]). Encouraged Utilization of Life Alert System at All Times While at Home. Encouraged Acceptance of Follow-Up Outreach Call with Demetrios Loll, Nurse Care Coordinator with [facility] [phone]), Scheduled on 12/02/2022 at 11:00 AM.  Encouraged Contact with CSW (# [phone]), if You Have Questions, Need Assistance, or If Additional Social Work Needs Are Identified Between Now & Our Next Follow-Up Outreach Call, Scheduled on 12/14/2022 at 10:30 AM.        SDOH assessments and interventions completed:  Yes.  SDOH Interventions Today    Flowsheet Row Most Recent Value  SDOH Interventions   Food Insecurity Interventions Intervention Not Indicated, Other (Comment)  [Receives $156.00 Food Stamps Per Month.]  Housing Interventions Intervention Not Indicated, Other (Comment)  [Resides in [employer] Housing.]  Transportation Interventions Intervention Not Indicated, Patient Resources (Friends/Family), Dance movement psychotherapist. Reminded of Transportation Benefits through [employer], [employer] & [place]  Access Medicaid Transportation.]  Utilities Interventions Intervention Not Indicated  Alcohol Usage Interventions Intervention Not Indicated (Score <7)  Financial Strain Interventions Intervention Not Indicated  Physical Activity Interventions Intervention Not Indicated, Local [employer], Other (Comments)  [Engages in Daily Walks Up & Down Road.]  Stress Interventions Offered [employer], Provide Counseling, Other (Comment)  [Provided Counseling & Supportive Services & Resources.]  Social Connections Interventions Intervention Not Indicated, Local [employer], Other (Comment)  [Attends Outings with Sister.]  Health Literacy Interventions Intervention Not Indicated, Other (Comment)  [Sister Attends Physician Appointments with Patient.]     Care Coordination Interventions:  Yes, provided.   Follow up plan: Follow up call scheduled for 12/14/2022 at 10:30 am.  Encounter Outcome:  Patient Visit Completed.   [name], BSW, MSW, Printmaker Social Work Case Set designer Health  [facility], Population Health Direct Dial: [phone]  Fax: [phone] Email: [email or web address]: [email or web address]

## 2022-11-30 ENCOUNTER — Other Ambulatory Visit: Payer: Self-pay

## 2022-11-30 ENCOUNTER — Emergency Department (HOSPITAL_COMMUNITY): Payer: 59

## 2022-11-30 ENCOUNTER — Encounter (HOSPITAL_COMMUNITY): Payer: Self-pay | Admitting: Emergency Medicine

## 2022-11-30 ENCOUNTER — Emergency Department (HOSPITAL_COMMUNITY)
Admission: EM | Admit: 2022-11-30 | Discharge: 2022-11-30 | Disposition: A | Discharge status: Home / Self Care | Payer: 59 | Attending: Emergency Medicine | Admitting: Emergency Medicine

## 2022-11-30 DIAGNOSIS — Z794 Long term (current) use of insulin: Secondary | ICD-10-CM | POA: Insufficient documentation

## 2022-11-30 DIAGNOSIS — M85871 Other specified disorders of bone density and structure, right ankle and foot: Secondary | ICD-10-CM | POA: Diagnosis not present

## 2022-11-30 DIAGNOSIS — W19XXXA Unspecified fall, initial encounter: Secondary | ICD-10-CM | POA: Insufficient documentation

## 2022-11-30 DIAGNOSIS — M25561 Pain in right knee: Secondary | ICD-10-CM | POA: Diagnosis not present

## 2022-11-30 DIAGNOSIS — I10 Essential (primary) hypertension: Secondary | ICD-10-CM | POA: Diagnosis not present

## 2022-11-30 DIAGNOSIS — E119 Type 2 diabetes mellitus without complications: Secondary | ICD-10-CM | POA: Diagnosis not present

## 2022-11-30 DIAGNOSIS — M549 Dorsalgia, unspecified: Secondary | ICD-10-CM | POA: Diagnosis not present

## 2022-11-30 DIAGNOSIS — Z79891 Long term (current) use of opiate analgesic: Secondary | ICD-10-CM | POA: Diagnosis not present

## 2022-11-30 DIAGNOSIS — S79911A Unspecified injury of right hip, initial encounter: Secondary | ICD-10-CM | POA: Diagnosis not present

## 2022-11-30 DIAGNOSIS — I6782 Cerebral ischemia: Secondary | ICD-10-CM | POA: Insufficient documentation

## 2022-11-30 DIAGNOSIS — S8001XA Contusion of right knee, initial encounter: Secondary | ICD-10-CM | POA: Diagnosis not present

## 2022-11-30 DIAGNOSIS — Z7901 Long term (current) use of anticoagulants: Secondary | ICD-10-CM | POA: Diagnosis not present

## 2022-11-30 DIAGNOSIS — S0990XA Unspecified injury of head, initial encounter: Secondary | ICD-10-CM | POA: Diagnosis not present

## 2022-11-30 DIAGNOSIS — G894 Chronic pain syndrome: Secondary | ICD-10-CM | POA: Diagnosis not present

## 2022-11-30 DIAGNOSIS — S199XXA Unspecified injury of neck, initial encounter: Secondary | ICD-10-CM | POA: Diagnosis not present

## 2022-11-30 DIAGNOSIS — M7731 Calcaneal spur, right foot: Secondary | ICD-10-CM | POA: Diagnosis not present

## 2022-11-30 DIAGNOSIS — M1611 Unilateral primary osteoarthritis, right hip: Secondary | ICD-10-CM | POA: Diagnosis not present

## 2022-11-30 DIAGNOSIS — M4802 Spinal stenosis, cervical region: Secondary | ICD-10-CM | POA: Diagnosis not present

## 2022-11-30 DIAGNOSIS — M542 Cervicalgia: Secondary | ICD-10-CM | POA: Diagnosis not present

## 2022-11-30 DIAGNOSIS — R600 Localized edema: Secondary | ICD-10-CM | POA: Diagnosis not present

## 2022-11-30 DIAGNOSIS — M79671 Pain in right foot: Secondary | ICD-10-CM | POA: Diagnosis not present

## 2022-11-30 DIAGNOSIS — Z79899 Other long term (current) drug therapy: Secondary | ICD-10-CM | POA: Insufficient documentation

## 2022-11-30 DIAGNOSIS — S7001XA Contusion of right hip, initial encounter: Secondary | ICD-10-CM | POA: Diagnosis not present

## 2022-11-30 DIAGNOSIS — I6529 Occlusion and stenosis of unspecified carotid artery: Secondary | ICD-10-CM | POA: Diagnosis not present

## 2022-11-30 DIAGNOSIS — M85861 Other specified disorders of bone density and structure, right lower leg: Secondary | ICD-10-CM | POA: Diagnosis not present

## 2022-11-30 LAB — CBG MONITORING, ED: Glucose-Capillary: 238 mg/dL — ABNORMAL HIGH (ref 70–99)

## 2022-11-30 MED ORDER — BACITRACIN ZINC 500 UNIT/GM EX OINT
TOPICAL_OINTMENT | Freq: Two times a day (BID) | CUTANEOUS | Status: DC
  Filled 2022-11-30: qty 0.9

## 2022-11-30 MED ORDER — BACITRACIN ZINC 500 UNIT/GM EX OINT: 1.0000 | TOPICAL_OINTMENT | Freq: Two times a day (BID) | CUTANEOUS | 0 refills | Status: AC

## 2022-11-30 NOTE — ED Triage Notes (Signed)
Per pt on last week he was going from sitting to standing position and tripped, landing on his right side, denies hitting head or LOC, (unwitness fall), c/o of right leg pain.

## 2022-11-30 NOTE — Discharge Instructions (Signed)
Wash off and reapply at the bacitracin ointment to your right forearm 1-2 times daily.  Keep the area covered with gauze.  Elevate your knee when possible.  Use your cane or walker when standing.  Tylenol if needed for pain.  Please follow-up with your primary care provider for recheck.  Also, follow-up with your orthopedic provider for recheck

## 2022-11-30 NOTE — ED Provider Notes (Signed)
[place] EMERGENCY DEPARTMENT AT [facility] Provider Note   CSN: [id number] Arrival date & time: 11/30/22  1251     History {Add pertinent medical, surgical, social history, OB history to HPI:1} Chief Complaint  Patient presents with   Jason Woods is a 77 y.o. male.   Fall Pertinent negatives include no chest pain, no abdominal pain, no headaches and no shortness of breath.       Jason Woods is a 77 y.o. male past medical history of hyper tension, type 2 diabetes, chronic back pain and legally blind who presents to the Emergency Department complaining of mechanical fall 1 week ago.  States that he uses a cane at baseline.  Tripped and fell landing on his right side.  He was able to get up on his own after the fall.  The fall was unwitnessed.  Fall was inside the home, lives by himself.  Complains of persistent pain of his right hip, right knee, right foot.  He is anticoagulated for his atrial flutter.  He denies any abdominal pain, chest pain, shortness of breath.  No known head injury or LOC.   Home Medications Prior to Admission medications   Medication Sig Start Date End Date Taking? Authorizing Provider  albuterol (VENTOLIN HFA) 108 (90 Base) MCG/ACT inhaler Inhale 1-2 puffs into the lungs every 6 (six) hours as needed for wheezing or shortness of breath. 07/15/21   Smoot, Sarah A, PA-C  ALPRAZolam Prudy Feeler) 1 MG tablet Take 0.5 tablets (0.5 mg total) by mouth 3 (three) times daily. 09/15/20   Johnson, Clanford L, MD  b complex vitamins tablet Take 1 tablet by mouth daily. With C    [provider]  bimatoprost (LUMIGAN) 0.01 % SOLN Place 1 drop into the right eye at bedtime.    [provider]  BREO ELLIPTA 100-25 MCG/ACT AEPB Inhale 1 puff into the lungs daily. 10/31/21   [provider]  cetirizine (ZYRTEC) 10 MG tablet Take 10 mg by mouth daily. 09/06/21   [provider]  Cholecalciferol 50 MCG (2000 UT) TABS  Take 2,000 Units by mouth daily.    [provider]  Continuous Blood Gluc Receiver (FREESTYLE LIBRE 2 READER) DEVI daily. 09/30/21   [provider]  Continuous Blood Gluc Sensor (FREESTYLE LIBRE 2 SENSOR) MISC SMARTSIG:Via Meter Every 10 Days 09/29/21   [provider]  cyanocobalamin 2000 MCG tablet Take 2,000 mcg by mouth daily.    [provider]  docusate sodium (COLACE) 100 MG capsule Take 1 capsule (100 mg total) by mouth 2 (two) times daily. While taking narcotic pain medicine. 12/06/18   Jacinta Shoe, PA-C  dorzolamide (TRUSOPT) 2 % ophthalmic solution Place 1 drop into both eyes 3 (three) times daily.  04/15/17   [provider]  DULoxetine (CYMBALTA) 30 MG capsule Take 30 mg by mouth daily.  10/11/17   [provider]  ELIQUIS 5 MG TABS tablet TAKE 1 TABLET BY MOUTH TWICE DAILY 06/18/22   Antoine Poche, MD  fluticasone [facility]) 50 MCG/ACT nasal spray Place 2 sprays into both nostrils daily.     [provider]  gabapentin (NEURONTIN) 400 MG capsule Take 400 mg by mouth 2 (two) times daily. 09/06/21   [provider]  HUMALOG KWIKPEN 100 UNIT/ML KwikPen Inject 5 Units into the skin in the morning, at noon, and at bedtime. 3 times per day with meals and at bedtime if blood sugar >200  09/06/21   [provider]  hydrOXYzine (ATARAX) 25 MG tablet Take 25 mg by mouth at bedtime. 09/06/21   [provider]  insulin glargine (LANTUS) 100 UNIT/ML injection Inject 0.1-0.18 mLs (10-18 Units total) into the skin See admin instructions. Per sliding  16 units in the morning and 8-10 units in the evening Patient taking differently: Inject 10-14 Units into the skin 2 (two) times daily. 10 units in the morning and 14 units with evening meal 09/15/20   Johnson, Clanford L, MD  ipratropium-albuterol (DUONEB) 0.5-2.5 (3) MG/3ML SOLN Take 3 mLs by nebulization every 6 (six) hours as needed for shortness of breath.  09/03/21   [provider]  ketoconazole (NIZORAL) 2 % cream Apply 1 application topically daily.    [provider]  losartan (COZAAR) 100 MG tablet Take 100 mg by mouth daily. 09/06/21   [provider]  metFORMIN (GLUCOPHAGE) 850 MG tablet Take 850 mg by mouth 2 (two) times daily with a meal.    [provider]  metoprolol succinate (TOPROL-XL) 25 MG 24 hr tablet Take 25 mg by mouth daily. 06/28/18   [provider]  Multiple Vitamin (MULTIVITAMIN WITH MINERALS) TABS tablet Take 1 tablet by mouth daily.    [provider]  oxyCODONE-acetaminophen (PERCOCET/ROXICET) 5-325 MG tablet Take 1 tablet by mouth every 4 (four) hours as needed. 06/23/22   Burgess Amor, PA-C  pantoprazole (PROTONIX) 40 MG tablet Take 40 mg by mouth daily.    [provider]  polyethylene glycol-electrolytes (TRILYTE) 420 g solution Take 4,000 mLs by mouth as directed. 12/12/21   Dolores Frame, MD  rosuvastatin (CRESTOR) 5 MG tablet Take 5 mg by mouth once a week. 09/06/21   [provider]  SYMBICORT 160-4.5 MCG/ACT inhaler Inhale 2 puffs into the lungs in the morning and at bedtime. 07/14/21   [provider]  traZODone (DESYREL) 100 MG tablet Take 200 mg by mouth at bedtime as needed for sleep. 09/08/21   [provider]  triamcinolone cream (KENALOG) 0.1 % Apply 1 application topically daily as needed (affected area(s) on skin).  04/15/17   [provider]  TRULANCE 3 MG TABS Take 1 tablet by mouth daily. 08/25/21   [provider]      Allergies    Lipitor [atorvastatin], Zocor [simvastatin-high dose], Celexa [citalopram hydrobromide], Crestor [rosuvastatin], Mobic [meloxicam], and Prozac [fluoxetine hcl]    Review of Systems   Review of Systems  Constitutional:  Negative for appetite change, chills and fever.  Respiratory:  Negative for shortness of breath.   Cardiovascular:  Negative for chest pain.   Gastrointestinal:  Negative for abdominal pain, diarrhea, nausea and vomiting.  Musculoskeletal:  Positive for arthralgias (Right hip, right knee, right foot pain).  Skin:  Positive for color change (bruising and abrasion right forearm).  Neurological:  Negative for dizziness, syncope, weakness, numbness and headaches.    Physical Exam Updated Vital Signs BP (!) 144/85   Pulse 93   Temp 98.1 F (36.7 C) (Oral)   Resp 20   Ht 5\' 7"  (1.702 m)   Wt 79.4 kg   SpO2 98%   BMI 27.41 kg/m  Physical Exam Vitals and nursing note reviewed.  Constitutional:      General: He is not in acute distress.    Appearance: Normal appearance. He is not ill-appearing or toxic-appearing.  HENT:     Head: Atraumatic.     Mouth/Throat:     Mouth: Mucous membranes are  moist.  Eyes:     Extraocular Movements: Extraocular movements intact.     Pupils: Pupils are equal, round, and reactive to light.  Cardiovascular:     Rate and Rhythm: Normal rate and regular rhythm.     Pulses: Normal pulses.  Pulmonary:     Effort: Pulmonary effort is normal.  Chest:     Chest wall: No tenderness.  Abdominal:     Palpations: Abdomen is soft.     Tenderness: There is no abdominal tenderness.  Musculoskeletal:        General: Swelling, tenderness and signs of injury present.     Cervical back: Normal range of motion. No tenderness.     Right hip: Tenderness present. No deformity, bony tenderness or crepitus. Normal range of motion. Normal strength.     Left lower leg: No edema.     Comments: Tenderness on ROM of the right hip.  No shortening or external rotation.  Pt able to bear weight on right leg.  TTP of the anterior right knee.  Small abrasion noted.  Maintains ROM of the knee w/o difficulty.  Bruising and edema to right foot.  Compartments are soft.   Skin:    General: Skin is warm.     Capillary Refill: Capillary refill takes less than 2 seconds.  Neurological:     General: No focal deficit present.      Mental Status: He is alert.     Sensory: No sensory deficit.     Motor: No weakness.     ED Results / Procedures / Treatments   Labs (all labs ordered are listed, but only abnormal results are displayed) Labs Reviewed - No data to display  EKG None  Radiology No results found.  Procedures Procedures  {Document cardiac monitor, telemetry assessment procedure when appropriate:1}  Medications Ordered in ED Medications - No data to display  ED Course/ Medical Decision Making/ A&P   {   Click here for ABCD2, HEART and other calculatorsREFRESH Note before signing :1}                              Medical Decision Making Amount and/or Complexity of Data Reviewed Radiology: ordered.     {Document critical care time when appropriate:1} {Document review of labs and clinical decision tools ie heart score, Chads2Vasc2 etc:1}  {Document your independent review of radiology images, and any outside records:1} {Document your discussion with family members, caretakers, and with consultants:1} {Document social determinants of health affecting pt's care:1} {Document your decision making why or why not admission, treatments were needed:1} Final Clinical Impression(s) / ED Diagnoses Final diagnoses:  None    Rx / DC Orders ED Discharge Orders     None

## 2022-12-02 ENCOUNTER — Ambulatory Visit: Payer: Self-pay | Admitting: *Deleted

## 2022-12-02 NOTE — Patient Outreach (Signed)
  Care Coordination   12/02/2022 Name: Jason Woods MRN: [id number] DOB: 1945/10/18   Care Coordination Outreach Attempts:  An unsuccessful telephone outreach was attempted for a scheduled appointment today.  Follow Up Plan:  Additional outreach attempts will be made to offer the patient care coordination information and services.   Encounter Outcome:  No Answer. Patient's phone number was repeatedly busy. Left HIPAA compliant voicemail for sister and emergency contact, Jason Woods.   Care Coordination Interventions:  No, not indicated. Staff message to scheduling care guide requesting that they reach out to reschedule telephone call.    Demetrios Loll, RN, BSN Care Management Coordinator [facility]  [facility] Direct Dial: [phone] Main #: [phone]

## 2022-12-03 ENCOUNTER — Telehealth: Payer: Self-pay | Admitting: *Deleted

## 2022-12-03 NOTE — Progress Notes (Signed)
  Care Coordination Note  12/03/2022 Name: KERVIN SWINGLER MRN: [id number] DOB: 10/27/1945  Colum BRENNON STUPP is a 77 y.o. year old male who is a primary care patient of Vyas, Dhruv B, MD and is actively engaged with the care management team. I reached out to [name] by phone today to assist with re-scheduling a follow up visit with the RN Case Manager  Follow up plan: Unsuccessful telephone outreach attempt made. A HIPAA compliant phone message was left for the patient providing contact information and requesting a return call.   [facility]  Care Coordination Care Guide  Direct Dial: [phone]

## 2022-12-08 DIAGNOSIS — E11649 Type 2 diabetes mellitus with hypoglycemia without coma: Secondary | ICD-10-CM | POA: Diagnosis not present

## 2022-12-08 DIAGNOSIS — E785 Hyperlipidemia, unspecified: Secondary | ICD-10-CM | POA: Diagnosis not present

## 2022-12-08 DIAGNOSIS — R410 Disorientation, unspecified: Secondary | ICD-10-CM | POA: Diagnosis not present

## 2022-12-08 DIAGNOSIS — F17210 Nicotine dependence, cigarettes, uncomplicated: Secondary | ICD-10-CM | POA: Diagnosis not present

## 2022-12-08 DIAGNOSIS — I10 Essential (primary) hypertension: Secondary | ICD-10-CM | POA: Diagnosis not present

## 2022-12-08 DIAGNOSIS — Z794 Long term (current) use of insulin: Secondary | ICD-10-CM | POA: Diagnosis not present

## 2022-12-08 NOTE — Progress Notes (Signed)
Care Coordination Note  12/08/2022 Name: Jason Woods MRN: [id number] DOB: May 31, 1945  Jason Woods is a 77 y.o. year old male who is a primary care patient of Vyas, Dhruv B, MD and is actively engaged with the care management team. I reached out to [name] by phone today to assist with re-scheduling a follow up visit with the RN Case Manager  Follow up plan: Unsuccessful telephone outreach attempt made. A HIPAA compliant phone message was left for the patient providing contact information and requesting a return call. We have been unable to make contact with the patient for follow up with RNCM.  [facility]  Care Coordination Care Guide  Direct Dial: [phone]

## 2022-12-09 DIAGNOSIS — R600 Localized edema: Secondary | ICD-10-CM | POA: Diagnosis not present

## 2022-12-10 DIAGNOSIS — I5022 Chronic systolic (congestive) heart failure: Secondary | ICD-10-CM | POA: Diagnosis not present

## 2022-12-10 DIAGNOSIS — Z299 Encounter for prophylactic measures, unspecified: Secondary | ICD-10-CM | POA: Diagnosis not present

## 2022-12-10 DIAGNOSIS — L03116 Cellulitis of left lower limb: Secondary | ICD-10-CM | POA: Diagnosis not present

## 2022-12-10 DIAGNOSIS — L03115 Cellulitis of right lower limb: Secondary | ICD-10-CM | POA: Diagnosis not present

## 2022-12-10 DIAGNOSIS — I10 Essential (primary) hypertension: Secondary | ICD-10-CM | POA: Diagnosis not present

## 2022-12-10 DIAGNOSIS — E11610 Type 2 diabetes mellitus with diabetic neuropathic arthropathy: Secondary | ICD-10-CM | POA: Diagnosis not present

## 2022-12-10 DIAGNOSIS — R600 Localized edema: Secondary | ICD-10-CM | POA: Diagnosis not present

## 2022-12-11 DIAGNOSIS — Z299 Encounter for prophylactic measures, unspecified: Secondary | ICD-10-CM | POA: Diagnosis not present

## 2022-12-11 DIAGNOSIS — I10 Essential (primary) hypertension: Secondary | ICD-10-CM | POA: Diagnosis not present

## 2022-12-11 DIAGNOSIS — L03116 Cellulitis of left lower limb: Secondary | ICD-10-CM | POA: Diagnosis not present

## 2022-12-11 DIAGNOSIS — I5022 Chronic systolic (congestive) heart failure: Secondary | ICD-10-CM | POA: Diagnosis not present

## 2022-12-11 DIAGNOSIS — E11610 Type 2 diabetes mellitus with diabetic neuropathic arthropathy: Secondary | ICD-10-CM | POA: Diagnosis not present

## 2022-12-11 DIAGNOSIS — L03115 Cellulitis of right lower limb: Secondary | ICD-10-CM | POA: Diagnosis not present

## 2022-12-11 DIAGNOSIS — R600 Localized edema: Secondary | ICD-10-CM | POA: Diagnosis not present

## 2022-12-14 ENCOUNTER — Ambulatory Visit: Payer: Self-pay | Admitting: *Deleted

## 2022-12-14 ENCOUNTER — Other Ambulatory Visit (HOSPITAL_COMMUNITY): Payer: Self-pay | Admitting: Cardiology

## 2022-12-14 ENCOUNTER — Encounter: Payer: Self-pay | Admitting: *Deleted

## 2022-12-14 DIAGNOSIS — I10 Essential (primary) hypertension: Secondary | ICD-10-CM | POA: Diagnosis not present

## 2022-12-14 DIAGNOSIS — Z299 Encounter for prophylactic measures, unspecified: Secondary | ICD-10-CM | POA: Diagnosis not present

## 2022-12-14 DIAGNOSIS — R600 Localized edema: Secondary | ICD-10-CM | POA: Diagnosis not present

## 2022-12-14 DIAGNOSIS — I5022 Chronic systolic (congestive) heart failure: Secondary | ICD-10-CM | POA: Diagnosis not present

## 2022-12-14 NOTE — Patient Outreach (Signed)
Care Coordination   Follow Up Visit Note   12/14/2022  Name: Jason Woods MRN: [id number] DOB: 1945/09/02  Jason Woods is a 77 y.o. year old male who sees Vyas, Dhruv B, MD for primary care. I spoke with Jason Woods by phone today.  What matters to the patients health and wellness today?  Find Help in My Community.   Goals Addressed               This Visit's Progress     Find Help in My Community. (pt-stated)   On track     Care Coordination Interventions:  Interventions Today    Flowsheet Row Most Recent Value  Chronic Disease   Chronic disease during today's visit Diabetes, Atrial Fibrillation (AFib), Hypertension (HTN), Chronic Obstructive Pulmonary Disease (COPD), Other, Chronic Kidney Disease/End Stage Renal Disease (ESRD)  [Acute Hypoxemic Respiratory Failure, Dysphagia, Inability to Perform Activities of Daily Living Independently, Cigarette Smoker, & Moderate Protein-Calorie Malnutrition.]  General Interventions   General Interventions Discussed/Reviewed General Interventions Discussed, Labs, Vaccines, Health Screening, Annual Foot Exam, General Interventions Reviewed, Lipid Profile, Community Resources, Horticulturist, commercial (DME), Annual Eye Exam, Level of Care, Communication with, Referral to Nurse, Doctor Visits  [Encouraged]  Labs Hgb A1c every 3 months, Kidney Function  [Encouraged]  Vaccines COVID-19, Tetanus/Pertussis/Diphtheria, Shingles, RSV, Pneumonia, Flu  [Encouraged]  Doctor Visits Discussed/Reviewed Doctor Visits Discussed, Specialist, Doctor Visits Reviewed, Annual Wellness Visits, PCP  [Encouraged]  Health Screening Bone Density, Colonoscopy, Prostate  [Encouraged]  Durable Medical Equipment (DME) BP Cuff, Glucomoter, Other  [Encouraged]  PCP/Specialist Visits Compliance with follow-up visit, Contact provider for referral to  [Encouraged]  Contacted provider for referral to --  [N/A]  Communication with PCP/Specialists, RN,  Pharmacists, Social Work  [Encouraged]  Level of Care Adult Daycare, Air traffic controller, Assisted Living, Skilled Holiday representative, Personal Care Services  [Encouraged]  Applications Medicaid, Personal Care Services, FL-2  [Encouraged]  Exercise Interventions   Exercise Discussed/Reviewed Exercise Discussed, Assistive device use and maintanence, [name] Managment, Physical Activity, Exercise Reviewed  [Encouraged]  Physical Activity Discussed/Reviewed Physical Activity Discussed, Home Exercise Program (HEP), PREP, Physical Activity Reviewed, Gym, Types of exercise  [Encouraged]  Weight Management Weight maintenance  [Encouraged]  Education Interventions   Education Provided Provided Therapist, sports, Provided Web-based Education, Provided Education  [Encouraged]  Provided Arboriculturist, When to see the doctor, Mental Health/Coping with Illness, Nutrition, Foot Care, Eye Care, Labs, Blood Sugar Monitoring, Medication, Exercise, Applications, [employer] Resources  [Encouraged]  Labs Reviewed --  [N/A]  Applications Medicaid, Personal Care Services, FL-2  [Encouraged]  Mental Health Interventions   Mental Health Discussed/Reviewed Other, Crisis, Suicide, Coping Strategies, Substance Abuse, Grief and Loss, Mental Health Reviewed, Depression, Anxiety, Mental Health Discussed  [Domestic Violence]  Nutrition Interventions   Nutrition Discussed/Reviewed Nutrition Discussed, Adding fruits and vegetables, Increasing proteins, Decreasing fats, Decreasing salt, Supplemental nutrition, Decreasing sugar intake, Portion sizes, Carbohydrate meal planning, Nutrition Reviewed, Fluid intake  [Encouraged]  Pharmacy Interventions   Pharmacy Dicussed/Reviewed Pharmacy Topics Discussed, Medications and their functions, Pharmacy Topics Reviewed, Medication Adherence, Affording Medications  [Encouraged]  Medication Adherence --  [N/A]  Safety Interventions   Safety Discussed/Reviewed Safety Discussed,  Safety Reviewed  [Encouraged]  Advanced Directive Interventions   Advanced Directives Discussed/Reviewed Advanced Directives Discussed, Advanced Directives Reviewed  [Completed]      Active Listening & Reflection Utilized.  Verbalization of Feelings Encouraged.  Emotional Support Provided. Feelings of Anxiousness & Stress Validated. Symptoms of Depression Acknowledged. Crisis Support Information,  Agencies, Services, & Resources Revisited. Problem Solving Interventions Employed. Task-Centered Solutions Activated.   Solution-Focused Strategies Indicated. Acceptance & Commitment Therapy Conducted. Brief Cognitive Behavioral Therapy Performed. Client-Centered Therapy Initiated. Encouraged Implementation of Deep Breathing Exercises, Relaxation Techniques, & Mindfulness Meditation Strategies Daily. Encouraged Administration of Medications, Exactly as Prescribed. Encouraged Increased Level of Activity & Exercise, as Tolerated. Encouraged Routine Engagement in Activities of Interest, Inside & Outside the Home. Encouraged Self-Enrollment in Smoking Cessation Classes, Services, Agencies, & Resources of Interest in [place], from List Provided, to [employer] with Quitting Smoking, in An Effort to Improve Overall Health & Well-Being. Encouraged Self-Enrollment with Psychiatrist of Interest in [facility], from List Provided, to Receive Psychotropic Medication Administration & Management, in An Effort to Reduce & Manage Symptoms of Anxiety & Depression. Encouraged Self-Enrollment with Therapist of Interest in [facility], from List Provided, to Receive Psychotherapeutic Counseling & Supportive Services, in An Effort to Reduce & Manage Symptoms of Anxiety & Depression. Confirmed Disinterest in Pursuing Higher Level of Care Placement Options, at The Present Time. Confirmed CAP/DA Civil engineer, contracting for Disabled Adults) Services in [place], with [facility]  Department of Health & Human Services/Medicaid Division of Health Benefits [phone]), 80 Hours Per Month, through [facility] [phone]). CSW Collaboration with Shanda Bumps, CAP/DA (Community Alternatives Program for Disabled Adults) Case Worker, with [employer] of Health & Human Services/Medicaid Division of Health Benefits [phone]), to [employer] Are Now First on Waiting List to Receive Increased Hours of CAP In-Home [employer] Per Month. CSW Collaboration with Kristin Bruins, Medicaid Case Worker with [facility] Department of Social Services [phone]), to Request Assistance in Determining Eligibility for An Increase in Amount of Food Stamps Received Each Month. ~ HIPAA Compliant Messages Left on Voicemail, as CSW Awaits a Return Call. Encouraged Use of Transportation Benefits through [employer] Dual Complete [employer], [employer] (# [phone]) & [employer] [phone]). Encouraged Use of Cane at All Times, to Assist with Mobility & Safety, to Prevent Falls & Injury. Encouraged Use of Life Alert System at All Times While at Home, By Pressing the Button to Access 24/7 Emergency Medical Responders, in The Event of An Emergency. Encouraged Contact with CSW (# [phone]), if You Have Questions, Need Assistance, or If Additional Social Work Needs Are Identified Between Now & Our Next Follow-Up Outreach Call, Scheduled on 12/28/2022 at 4:15 PM. Encouraged Attendance at Follow-Up Appointment with Dr. Fraser Din, General Surgeon with [facility] [phone]), Scheduled on 12/29/2022 at 12:00 PM.      SDOH assessments and interventions completed:  Yes.  Care Coordination Interventions:  Yes, provided.   Follow up plan: Follow up call scheduled for 12/28/2022 at 4:15 pm.  Encounter Outcome:  Patient Visit Completed.    [name], BSW, MSW, Printmaker Social Work Case Set designer Health  [facility], Population Health Direct Dial: [phone]  Fax: [phone] Email: [email or web address]: [email or web address]
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
## 2022-12-14 NOTE — Telephone Encounter (Signed)
Prescription refill request for Eliquis received. Indication: A Flutter Last office visit: 12/11/21  Dominga Ferry MD Scr: 1.51 on 12/08/22  Epic Age: 77 Weight: 71.8kg  Based on above findings Eliquis 5mg  twice daily is the appropriate dose.  Refill approved.  Pt is past due for appt with Dr Wyline Mood.  Message sent to schedulers to make appt.  Refill approved.

## 2022-12-15 DIAGNOSIS — S82892B Other fracture of left lower leg, initial encounter for open fracture type I or II: Secondary | ICD-10-CM | POA: Diagnosis not present

## 2022-12-15 DIAGNOSIS — G9341 Metabolic encephalopathy: Secondary | ICD-10-CM | POA: Diagnosis not present

## 2022-12-15 DIAGNOSIS — E876 Hypokalemia: Secondary | ICD-10-CM | POA: Diagnosis not present

## 2022-12-15 DIAGNOSIS — R531 Weakness: Secondary | ICD-10-CM | POA: Diagnosis not present

## 2022-12-21 DIAGNOSIS — I5022 Chronic systolic (congestive) heart failure: Secondary | ICD-10-CM | POA: Diagnosis not present

## 2022-12-21 DIAGNOSIS — F17210 Nicotine dependence, cigarettes, uncomplicated: Secondary | ICD-10-CM | POA: Diagnosis not present

## 2022-12-21 DIAGNOSIS — Z23 Encounter for immunization: Secondary | ICD-10-CM | POA: Diagnosis not present

## 2022-12-21 DIAGNOSIS — I5032 Chronic diastolic (congestive) heart failure: Secondary | ICD-10-CM | POA: Diagnosis not present

## 2022-12-21 DIAGNOSIS — I10 Essential (primary) hypertension: Secondary | ICD-10-CM | POA: Diagnosis not present

## 2022-12-21 DIAGNOSIS — Z299 Encounter for prophylactic measures, unspecified: Secondary | ICD-10-CM | POA: Diagnosis not present

## 2022-12-22 DIAGNOSIS — M179 Osteoarthritis of knee, unspecified: Secondary | ICD-10-CM | POA: Diagnosis not present

## 2022-12-22 DIAGNOSIS — E11610 Type 2 diabetes mellitus with diabetic neuropathic arthropathy: Secondary | ICD-10-CM | POA: Diagnosis not present

## 2022-12-28 ENCOUNTER — Ambulatory Visit: Payer: Self-pay | Admitting: *Deleted
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
## 2023-01-07 DIAGNOSIS — F17210 Nicotine dependence, cigarettes, uncomplicated: Secondary | ICD-10-CM | POA: Diagnosis not present

## 2023-01-07 DIAGNOSIS — Z299 Encounter for prophylactic measures, unspecified: Secondary | ICD-10-CM | POA: Diagnosis not present

## 2023-01-07 DIAGNOSIS — E1165 Type 2 diabetes mellitus with hyperglycemia: Secondary | ICD-10-CM | POA: Diagnosis not present

## 2023-01-07 DIAGNOSIS — I10 Essential (primary) hypertension: Secondary | ICD-10-CM | POA: Diagnosis not present

## 2023-01-12 DIAGNOSIS — Z299 Encounter for prophylactic measures, unspecified: Secondary | ICD-10-CM | POA: Diagnosis not present

## 2023-01-12 DIAGNOSIS — F17210 Nicotine dependence, cigarettes, uncomplicated: Secondary | ICD-10-CM | POA: Diagnosis not present

## 2023-01-12 DIAGNOSIS — J449 Chronic obstructive pulmonary disease, unspecified: Secondary | ICD-10-CM | POA: Diagnosis not present

## 2023-01-12 DIAGNOSIS — E1142 Type 2 diabetes mellitus with diabetic polyneuropathy: Secondary | ICD-10-CM | POA: Diagnosis not present

## 2023-01-12 DIAGNOSIS — E1169 Type 2 diabetes mellitus with other specified complication: Secondary | ICD-10-CM | POA: Diagnosis not present

## 2023-01-12 DIAGNOSIS — R52 Pain, unspecified: Secondary | ICD-10-CM | POA: Diagnosis not present

## 2023-01-12 DIAGNOSIS — I10 Essential (primary) hypertension: Secondary | ICD-10-CM | POA: Diagnosis not present

## 2023-01-14 DIAGNOSIS — R531 Weakness: Secondary | ICD-10-CM | POA: Diagnosis not present

## 2023-01-14 DIAGNOSIS — S82892B Other fracture of left lower leg, initial encounter for open fracture type I or II: Secondary | ICD-10-CM | POA: Diagnosis not present

## 2023-01-14 DIAGNOSIS — E876 Hypokalemia: Secondary | ICD-10-CM | POA: Diagnosis not present

## 2023-01-14 DIAGNOSIS — G9341 Metabolic encephalopathy: Secondary | ICD-10-CM | POA: Diagnosis not present

## 2023-01-15 ENCOUNTER — Ambulatory Visit: Payer: 59 | Attending: Nurse Practitioner | Admitting: Nurse Practitioner

## 2023-01-15 ENCOUNTER — Encounter: Payer: Self-pay | Admitting: Nurse Practitioner

## 2023-01-15 VITALS — BP 112/70 | HR 74 | Ht 67.0 in | Wt 162.8 lb

## 2023-01-15 DIAGNOSIS — E785 Hyperlipidemia, unspecified: Secondary | ICD-10-CM | POA: Diagnosis not present

## 2023-01-15 DIAGNOSIS — I4891 Unspecified atrial fibrillation: Secondary | ICD-10-CM | POA: Diagnosis not present

## 2023-01-15 DIAGNOSIS — R600 Localized edema: Secondary | ICD-10-CM | POA: Diagnosis not present

## 2023-01-15 DIAGNOSIS — N179 Acute kidney failure, unspecified: Secondary | ICD-10-CM

## 2023-01-15 DIAGNOSIS — I10 Essential (primary) hypertension: Secondary | ICD-10-CM

## 2023-01-15 DIAGNOSIS — I4892 Unspecified atrial flutter: Secondary | ICD-10-CM

## 2023-01-15 DIAGNOSIS — I484 Atypical atrial flutter: Secondary | ICD-10-CM

## 2023-01-15 DIAGNOSIS — Z79899 Other long term (current) drug therapy: Secondary | ICD-10-CM

## 2023-01-15 DIAGNOSIS — I5030 Unspecified diastolic (congestive) heart failure: Secondary | ICD-10-CM | POA: Diagnosis not present

## 2023-01-15 NOTE — Patient Instructions (Addendum)
Medication Instructions:  Your physician recommends that you continue on your current medications as directed. Please refer to the Current Medication list given to you today.  Labwork: In 1 week at [facility]   Testing/Procedures: Your physician has requested that you have an echocardiogram. Echocardiography is a painless test that uses sound waves to create images of your heart. It provides your doctor with information about the size and shape of your heart and how well your heart's chambers and valves are working. This procedure takes approximately one hour. There are no restrictions for this procedure. Please do NOT wear cologne, perfume, aftershave, or lotions (deodorant is allowed). Please arrive 15 minutes prior to your appointment time.  Follow-Up: Your physician recommends that you schedule a follow-up appointment in: 4-6 weeks   Any Other Special Instructions Will Be Listed Below (If Applicable).   If you need a refill on your cardiac medications before your next appointment, please call your pharmacy.

## 2023-01-15 NOTE — Progress Notes (Unsigned)
Cardiology Office Note:  .   Date:  01/15/2023 ID:  Jason Woods, DOB Nov 25, 1945, MRN [id number] PCP: Ignatius Specking, MD  [facility] Providers Cardiologist:  Dina Rich, MD    History of Present Illness: Jason Woods   Jason Woods is a 77 y.o. male with PMH of A-fib/A-flutter, COPD, hypertension, type 2 diabetes, CKD, high cholesterol, anxiety, and chronic back pain, who presents today for scheduled office visit.  Last seen by Dr. Dina Rich on December 11, 2021.  He was doing well at the time from a cardiac perspective.  Today he presents for scheduled follow-up.  PCP has referred him for evaluation for heart failure, has some leg swelling.  Today he states that about 1 month ago, while on gabapentin he noticed leg edema, was having weeping of the skin.  Was prescribed a diuretic by his PCP and leg swelling went away, has not returned to gabapentin since.  He attributes his leg swelling to gabapentin.  Overall says he is doing well at the current moment. Denies any chest pain, shortness of breath, palpitations, syncope, presyncope, dizziness, orthopnea, PND, swelling or significant weight changes, acute bleeding, or claudication.  Smokes 1 pack of cigarettes per week.  ROS: Negative.  See HPI.  Studies Reviewed: .    Echo 10/2021: 1. Left ventricular ejection fraction, by estimation, is 50 to 55%. The  left ventricle has low normal function. The left ventricle demonstrates  global hypokinesis. There is mild asymmetric left ventricular hypertrophy of the septal segment. Left  ventricular diastolic parameters are indeterminate. The average left  ventricular global longitudinal strain is -14.1 %. The global longitudinal strain is abnormal.   2. RV-RA gradient normal at 13 mmHg suggesting normal RVSP in the setting of normal CVP. Right ventricular systolic function is normal. The right ventricular size is normal.   3. Left atrial size was upper normal.   4. The mitral  valve is degenerative. Mild mitral valve regurgitation.   5. The aortic valve is tricuspid. There is mild calcification of the  aortic valve. Aortic valve regurgitation is not visualized. Aortic valve  sclerosis/calcification is present, without any evidence of aortic  stenosis.   6. Unable to estimate CVP.   Comparison(s): Prior images unable to be directly viewed.  Physical Exam:   VS:  BP 112/70   Pulse 74   Ht 5\' 7"  (1.702 m)   Wt 162 lb 12.8 oz (73.8 kg)   SpO2 95%   BMI 25.50 kg/m    Wt Readings from Last 3 Encounters:  01/15/23 162 lb 12.8 oz (73.8 kg)  11/30/22 175 lb (79.4 kg)  06/23/22 157 lb 13.6 oz (71.6 kg)    GEN: Well nourished, well developed in no acute distress NECK: No JVD; No carotid bruits CARDIAC: S1/S2, RRR, no murmurs, rubs, gallops RESPIRATORY:  Clear to auscultation with inspiratory wheezing noted along right posterior lung fields, otherwise unremarkable  ABDOMEN: Soft, non-tender, non-distended EXTREMITIES:  No edema; No deformity   ASSESSMENT AND PLAN: .    HFpEF, leg edema, medication management Stage C, NYHA class I symptoms. Recent episode of leg edema, but states this has resolved since stopping gabapentin. Labwork with PCP revealed proBNP 29,090, with repeat BNP 13,499. Euvolemic and well compensated on exam. Continue current medication regimen.  Will obtain echocardiogram and arrange the following labs to be drawn: proBNP, magnesium, and BMET. Low sodium diet, fluid restriction <2L, and daily weights encouraged. Educated to contact our office for weight gain of  2 lbs overnight or 5 lbs in one week.  A-fib/A-flutter Denies any tachycardia or palpitations. HR well controlled today. Continue current medication regimen. Denies any bleeding issues on Eliquis.  Continue Eliquis for stroke prevention.  HTN Blood pressure well-controlled. Discussed to monitor BP at home at least 2 hours after medications and sitting for 5-10 minutes.  No medication  changes at this time. Heart healthy diet and regular cardiovascular exercise encouraged.   4. HLD LDL 61 01/2022.  Continue current medication regimen.  Being managed by PCP. Heart healthy diet and regular cardiovascular exercise encouraged.  Continue to follow with PCP.  Dispo: Follow-up with me/APP in 4 to 6 weeks or sooner if anything changes.  Signed, Sharlene Dory, NP

## 2023-01-18 DIAGNOSIS — R0602 Shortness of breath: Secondary | ICD-10-CM | POA: Diagnosis not present

## 2023-01-18 DIAGNOSIS — E2601 Conn's syndrome: Secondary | ICD-10-CM | POA: Diagnosis not present

## 2023-01-19 MED ORDER — FUROSEMIDE 20 MG PO TABS: 20.0000 mg | ORAL_TABLET | Freq: Every day | ORAL | 1 refills | Status: DC

## 2023-01-19 NOTE — Addendum Note (Signed)
Addended by: Sharen Hones on: 01/19/2023 11:44 AM   Modules accepted: Orders

## 2023-01-25 DIAGNOSIS — M4802 Spinal stenosis, cervical region: Secondary | ICD-10-CM | POA: Diagnosis not present

## 2023-01-25 DIAGNOSIS — Z79891 Long term (current) use of opiate analgesic: Secondary | ICD-10-CM | POA: Diagnosis not present

## 2023-01-25 DIAGNOSIS — G894 Chronic pain syndrome: Secondary | ICD-10-CM | POA: Diagnosis not present

## 2023-01-25 DIAGNOSIS — M542 Cervicalgia: Secondary | ICD-10-CM | POA: Diagnosis not present

## 2023-01-25 DIAGNOSIS — M549 Dorsalgia, unspecified: Secondary | ICD-10-CM | POA: Diagnosis not present

## 2023-01-27 ENCOUNTER — Ambulatory Visit: Payer: Self-pay | Admitting: *Deleted

## 2023-01-27 DIAGNOSIS — E1165 Type 2 diabetes mellitus with hyperglycemia: Secondary | ICD-10-CM | POA: Diagnosis not present

## 2023-01-27 DIAGNOSIS — E7800 Pure hypercholesterolemia, unspecified: Secondary | ICD-10-CM | POA: Diagnosis not present

## 2023-01-27 DIAGNOSIS — Z0000 Encounter for general adult medical examination without abnormal findings: Secondary | ICD-10-CM | POA: Diagnosis not present

## 2023-01-27 DIAGNOSIS — R5383 Other fatigue: Secondary | ICD-10-CM | POA: Diagnosis not present

## 2023-01-27 DIAGNOSIS — Z299 Encounter for prophylactic measures, unspecified: Secondary | ICD-10-CM | POA: Diagnosis not present

## 2023-01-27 NOTE — Patient Instructions (Signed)
Visit Information  Thank you for taking time to visit with me today. Please don't hesitate to contact me if I can be of assistance to you.   Following are the goals we discussed today:   Goals Addressed               This Visit's Progress     Find Help in My Community. (pt-stated)   On track     Care Coordination Interventions:  Interventions Today    Flowsheet Row Most Recent Value  Chronic Disease   Chronic disease during today's visit Diabetes, Chronic Obstructive Pulmonary Disease (COPD), Hypertension (HTN), Atrial Fibrillation (AFib), Chronic Kidney Disease/End Stage Renal Disease (ESRD), Other  [Acute Hypoxemic Respiratory Failure, Dysphagia, Inability to Perform Activities of Daily Living Independently, Cigarette Smoker, & Moderate Protein-Calorie Malnutrition]  General Interventions   General Interventions Discussed/Reviewed General Interventions Discussed, Doctor Visits, Communication with, Level of Care, [employer], Horticulturist, commercial (DME), General Interventions Reviewed  [Communication with Care Team Members]  Doctor Visits Discussed/Reviewed Doctor Visits Discussed, Specialist, Annual Wellness Visits, Doctor Visits Reviewed, PCP  [Encouraged Routine Engagement]  Horticulturist, commercial (DME) BP Cuff, Glucomoter, Other  [Cane]  PCP/Specialist Visits Compliance with follow-up visit  [Encouraged]  Communication with PCP/Specialists, RN  [Encouraged]  Level of Care Adult Daycare, Assisted Living, Personal Care Services  Applications Personal Care Services, [place], Other  [Medicaid Recipient,  Approved for 80 Hours of CAPS]  Exercise Interventions   Exercise Discussed/Reviewed Exercise Discussed, Assistive device use and maintanence, Exercise Reviewed, Physical Activity, Weight Managment  [Encouraged]  Physical Activity Discussed/Reviewed Physical Activity Discussed, Home Exercise Program (HEP), PREP, Physical Activity Reviewed, Gym, Types of exercise   [Encouraged]  Education Interventions   Education Provided Provided Education  Provided Verbal Education On Nutrition, When to see the doctor, Mental Health/Coping with Illness, Programmer, applications, Exercise, Medication  Applications Personal Care Services, Medicaid, Other  [Medicaid Recipient,  Approved for 80 Hours of CAPS]  Mental Health Interventions   Mental Health Discussed/Reviewed Anxiety, Mental Health Discussed, Depression, Mental Health Reviewed, Grief and Loss, Substance Abuse, Coping Strategies, Suicide, Crisis  [Assessed]  Nutrition Interventions   Nutrition Discussed/Reviewed Nutrition Discussed, Adding fruits and vegetables, Nutrition Reviewed, Fluid intake, Portion sizes, Carbohydrate meal planning, Decreasing sugar intake, Decreasing salt, Decreasing fats, Increasing proteins  [Encouraged]  Pharmacy Interventions   Pharmacy Dicussed/Reviewed Pharmacy Topics Discussed, Medications and their functions, Medication Adherence, Pharmacy Topics Reviewed, Affording Medications  [Encouraged]  Safety Interventions   Safety Discussed/Reviewed Safety Discussed, Safety Reviewed  Advanced Directive Interventions   Advanced Directives Discussed/Reviewed Advanced Directives Discussed  [Advanced Directives in Place]      Active Listening & Reflection Utilized.  Verbalization of Feelings Encouraged.  Emotional Support Provided. Cognitive Behavioral Therapy Performed. Confirmed Receipt, Thoroughly Reviewed & Read Aloud Educational Material, "Loneliness & Isolation: Recruitment consultant", to [employer], Entertain Questions & Encourage Engagement in Activities of Interest, with Assistance of Sister or Merchandiser, retail.  Encouraged Engagement with [name], Social Work Case Manager with [facility] [phone]), if You Have Questions, Need Assistance, or If Additional Social Work Needs Are Identified Between Now & Our Next Follow-Up Outreach Call, Scheduled on  02/10/2023 at 12:45 PM.      Our next appointment is by telephone on 02/10/2023 at 12:45 pm.  Please call the care guide team at [phone] if you need to cancel or reschedule your appointment.   If you are experiencing a Mental Health or Behavioral Health Crisis or need someone to talk  to, please call the Suicide and Crisis Lifeline: 988 call the [place] National Suicide Prevention Lifeline: 905-386-2264 or TTY: 445-368-1851 TTY 5080476373) to talk to a trained counselor call 1-800-273-TALK (toll free, 24 hour hotline) go to [facility] [address], [place] [phone]) call the [facility] Crisis Line: [phone] call 911  Patient verbalizes understanding of instructions and care plan provided today and agrees to view in MyChart. Active MyChart status and patient understanding of how to access instructions and care plan via MyChart confirmed with patient.     Telephone follow up appointment with care management team member scheduled for:  02/10/2023 at 12:45 pm.  [name], BSW, MSW, LCSW  Embedded Practice Social Work Case Manager  [facility], Population Health Direct Dial: [phone]  Fax: [phone] Email: [email or web address]

## 2023-01-27 NOTE — Patient Outreach (Signed)
Care Coordination   Follow Up Visit Note   01/28/2023  Name: ROLLO FARQUHAR MRN: [id number] DOB: 1945/12/06  Noelle NOLLIE TERLIZZI is a 77 y.o. year old male who sees Vyas, Dhruv B, MD for primary care. I spoke with [name] by phone today.  What matters to the patients health and wellness today?  Find Help in My Community.    Goals Addressed               This Visit's Progress     Find Help in My Community. (pt-stated)   On track     Care Coordination Interventions:  Interventions Today    Flowsheet Row Most Recent Value  Chronic Disease   Chronic disease during today's visit Diabetes, Chronic Obstructive Pulmonary Disease (COPD), Hypertension (HTN), Atrial Fibrillation (AFib), Chronic Kidney Disease/End Stage Renal Disease (ESRD), Other  [Acute Hypoxemic Respiratory Failure, Dysphagia, Inability to Perform Activities of Daily Living Independently, Cigarette Smoker, & Moderate Protein-Calorie Malnutrition]  General Interventions   General Interventions Discussed/Reviewed General Interventions Discussed, Doctor Visits, Communication with, Level of Care, [employer], Horticulturist, commercial (DME), General Interventions Reviewed  [Communication with Care Team Members]  Doctor Visits Discussed/Reviewed Doctor Visits Discussed, Specialist, Annual Wellness Visits, Doctor Visits Reviewed, PCP  [Encouraged Routine Engagement]  Horticulturist, commercial (DME) BP Cuff, Glucomoter, Other  [Cane]  PCP/Specialist Visits Compliance with follow-up visit  [Encouraged]  Communication with PCP/Specialists, RN  [Encouraged]  Level of Care Adult Daycare, Assisted Living, Personal Care Services  Applications Personal Care Services, [place], Other  [Medicaid Recipient,  Approved for 80 Hours of CAPS]  Exercise Interventions   Exercise Discussed/Reviewed Exercise Discussed, Assistive device use and maintanence, Exercise Reviewed, Physical Activity, Weight Managment  [Encouraged]   Physical Activity Discussed/Reviewed Physical Activity Discussed, Home Exercise Program (HEP), PREP, Physical Activity Reviewed, Gym, Types of exercise  [Encouraged]  Education Interventions   Education Provided Provided Education  Provided Verbal Education On Nutrition, When to see the doctor, Mental Health/Coping with Illness, Programmer, applications, Exercise, Medication  Applications Personal Care Services, Medicaid, Other  [Medicaid Recipient,  Approved for 80 Hours of CAPS]  Mental Health Interventions   Mental Health Discussed/Reviewed Anxiety, Mental Health Discussed, Depression, Mental Health Reviewed, Grief and Loss, Substance Abuse, Coping Strategies, Suicide, Crisis  [Assessed]  Nutrition Interventions   Nutrition Discussed/Reviewed Nutrition Discussed, Adding fruits and vegetables, Nutrition Reviewed, Fluid intake, Portion sizes, Carbohydrate meal planning, Decreasing sugar intake, Decreasing salt, Decreasing fats, Increasing proteins  [Encouraged]  Pharmacy Interventions   Pharmacy Dicussed/Reviewed Pharmacy Topics Discussed, Medications and their functions, Medication Adherence, Pharmacy Topics Reviewed, Affording Medications  [Encouraged]  Safety Interventions   Safety Discussed/Reviewed Safety Discussed, Safety Reviewed  Advanced Directive Interventions   Advanced Directives Discussed/Reviewed Advanced Directives Discussed  [Advanced Directives in Place]      Active Listening & Reflection Utilized.  Verbalization of Feelings Encouraged.  Emotional Support Provided. Cognitive Behavioral Therapy Performed. Confirmed Receipt, Thoroughly Reviewed & Read Aloud Educational Material, "Loneliness & Isolation: Recruitment consultant", to [employer], Entertain Questions & Encourage Engagement in Activities of Interest, with Assistance of Sister or Merchandiser, retail.  CSW Collaboration with Sister, Murriel Hopper to Confirm Patient is Administering Medications Exactly as  Prescribed. Encouraged Engagement with [name], Social Work Case Manager with [facility] [phone]), if You Have Questions, Need Assistance, or If Additional Social Work Needs Are Identified Between Now & Our Next Follow-Up Outreach Call, Scheduled on 02/10/2023 at 12:45 PM.      SDOH assessments  and interventions completed:  Yes.  Care Coordination Interventions:  Yes, provided.   Follow up plan: Follow up call scheduled for 02/10/2023 at 12:45 pm.  Encounter Outcome:  Patient Visit Completed.   [name], BSW, MSW, Printmaker Social Work Case Set designer Health  [facility], Population Health Direct Dial: [phone]  Fax: [phone] Email: [email or web address]: [email or web address]

## 2023-01-28 DIAGNOSIS — M179 Osteoarthritis of knee, unspecified: Secondary | ICD-10-CM | POA: Diagnosis not present

## 2023-01-28 DIAGNOSIS — E11610 Type 2 diabetes mellitus with diabetic neuropathic arthropathy: Secondary | ICD-10-CM | POA: Diagnosis not present

## 2023-01-29 DIAGNOSIS — R600 Localized edema: Secondary | ICD-10-CM | POA: Diagnosis not present

## 2023-01-29 DIAGNOSIS — I5030 Unspecified diastolic (congestive) heart failure: Secondary | ICD-10-CM | POA: Diagnosis not present

## 2023-01-29 DIAGNOSIS — Z79899 Other long term (current) drug therapy: Secondary | ICD-10-CM | POA: Diagnosis not present

## 2023-02-05 ENCOUNTER — Other Ambulatory Visit: Payer: Self-pay | Admitting: Nurse Practitioner

## 2023-02-05 DIAGNOSIS — N179 Acute kidney failure, unspecified: Secondary | ICD-10-CM

## 2023-02-05 DIAGNOSIS — I5030 Unspecified diastolic (congestive) heart failure: Secondary | ICD-10-CM

## 2023-02-05 DIAGNOSIS — Z79899 Other long term (current) drug therapy: Secondary | ICD-10-CM

## 2023-02-05 DIAGNOSIS — R600 Localized edema: Secondary | ICD-10-CM

## 2023-02-05 MED ORDER — FUROSEMIDE 40 MG PO TABS
40.0000 mg | ORAL_TABLET | Freq: Every day | ORAL | 1 refills | Status: DC
Start: 2023-02-05 — End: 2023-08-11

## 2023-02-08 ENCOUNTER — Telehealth: Payer: Self-pay | Admitting: Nurse Practitioner

## 2023-02-08 ENCOUNTER — Ambulatory Visit: Payer: 59 | Attending: Nurse Practitioner

## 2023-02-08 DIAGNOSIS — R600 Localized edema: Secondary | ICD-10-CM

## 2023-02-08 NOTE — Telephone Encounter (Signed)
Spoke wit Staci - home health aide - at time of call.   States there was some confusion on his Lasix dosing.  Initially was on Lasix 20mg  daily.  At the time of doing his labs on 01/29/2023 he had only been on this dose about 2-3 days.  He was called on 02/05/23 & was told to increase the dose to 40mg  daily.  He has continued on the 20mg  daily as aide states he had not been on that dose long enough prior to doing the labs.  She would like to have him continue the Lasix 20mg  daily until doing his upcoming labs on 02/12/23.  Weight today 161.8lb.    Call back number is [phone].

## 2023-02-09 LAB — ECHOCARDIOGRAM COMPLETE
AR max vel: 1.95 cm2
AV Area VTI: 1.94 cm2
AV Area mean vel: 1.97 cm2
AV Mean grad: 2 mm[Hg]
AV Peak grad: 3.6 mm[Hg]
Ao pk vel: 0.95 m/s
Area-P 1/2: 2.93 cm2
Calc EF: 42.1 %
MV M vel: 5.08 m/s
MV Peak grad: 103.2 mm[Hg]
MV VTI: 1.09 cm2
S' Lateral: 3.9 cm
Single Plane A2C EF: 47.2 %
Single Plane A4C EF: 36.7 %

## 2023-02-10 ENCOUNTER — Ambulatory Visit: Payer: Self-pay | Admitting: *Deleted

## 2023-02-10 NOTE — Patient Instructions (Signed)
Visit Information  Thank you for taking time to visit with me today. Please don't hesitate to contact me if I can be of assistance to you.   Following are the goals we discussed today:   Goals Addressed               This Visit's Progress     COMPLETED: Find Help in My Community. (pt-stated)   On track     Care Coordination Interventions:  Interventions Today    Flowsheet Row Most Recent Value  Chronic Disease   Chronic disease during today's visit Diabetes, Chronic Obstructive Pulmonary Disease (COPD), Hypertension (HTN), Atrial Fibrillation (AFib), Chronic Kidney Disease/End Stage Renal Disease (ESRD), Other  [Acute Hypoxemic Respiratory Failure, Dysphagia, Inability to Perform Activities of Daily Living Independently, Cigarette Smoker, & Moderate Protein-Calorie Malnutrition]  General Interventions   General Interventions Discussed/Reviewed General Interventions Discussed, Doctor Visits, Communication with, Level of Care, [facility], Horticulturist, commercial (DME), General Interventions Reviewed  [Communication with Care Team Members]  Doctor Visits Discussed/Reviewed Doctor Visits Discussed, Specialist, Annual Wellness Visits, Doctor Visits Reviewed, PCP  [Encouraged Routine Engagement]  Horticulturist, commercial (DME) BP Cuff, Glucomoter, Other  [Cane]  PCP/Specialist Visits Compliance with follow-up visit  [Encouraged]  Communication with PCP/Specialists, RN  [Encouraged]  Level of Care Adult Daycare, Assisted Living, Personal Care Services  Applications Personal Care Services, [place], Other  [Medicaid Recipient,  Approved for 80 Hours of CAPS]  Exercise Interventions   Exercise Discussed/Reviewed Exercise Discussed, Assistive device use and maintanence, Exercise Reviewed, Physical Activity, Weight Managment  [Encouraged]  Physical Activity Discussed/Reviewed Physical Activity Discussed, Home Exercise Program (HEP), PREP, Physical Activity Reviewed, Gym, Types of  exercise  [Encouraged]  Education Interventions   Education Provided Provided Education  Provided Verbal Education On Nutrition, When to see the doctor, Mental Health/Coping with Illness, Programmer, applications, Exercise, Medication  Applications Personal Care Services, Medicaid, Other  [Medicaid Recipient,  Approved for 80 Hours of CAPS]  Mental Health Interventions   Mental Health Discussed/Reviewed Anxiety, Mental Health Discussed, Depression, Mental Health Reviewed, Grief and Loss, Substance Abuse, Coping Strategies, Suicide, Crisis  [Assessed]  Nutrition Interventions   Nutrition Discussed/Reviewed Nutrition Discussed, Adding fruits and vegetables, Nutrition Reviewed, Fluid intake, Portion sizes, Carbohydrate meal planning, Decreasing sugar intake, Decreasing salt, Decreasing fats, Increasing proteins  [Encouraged]  Pharmacy Interventions   Pharmacy Dicussed/Reviewed Pharmacy Topics Discussed, Medications and their functions, Medication Adherence, Pharmacy Topics Reviewed, Affording Medications  [Encouraged]  Safety Interventions   Safety Discussed/Reviewed Safety Discussed, Safety Reviewed  Advanced Directive Interventions   Advanced Directives Discussed/Reviewed Advanced Directives Discussed  [Advanced Directives in Place]      Active Listening & Reflection Utilized.  Verbalization of Feelings Encouraged.  Emotional Support Provided. Cognitive Behavioral Therapy Performed. Continued Review of Educational Material, "Loneliness & Isolation: Recruitment consultant" & Encouraged Engagement in Activities of Interest in [place], from List Provided.  Encouraged Engagement with [name], Licensed Clinical Social Worker with [facility] [phone]), if You Have Questions, Need Assistance, or If Additional Social Work Needs Are Identified in The Near Future.       Please call the care guide team at [phone] if you need to cancel or reschedule your  appointment.   If you are experiencing a Mental Health or Behavioral Health Crisis or need someone to talk to, please call the Suicide and Crisis Lifeline: 988 call the [place] National Suicide Prevention Lifeline: (816)749-7937 or TTY: 817-051-6752 TTY 209-580-5699) to talk to a trained counselor call 1-800-273-TALK (  toll free, 24 hour hotline) go to [facility] Urgent [facility] [address] [place] [phone]) call the [facility] Crisis Line: 7067077580 call 911  Patient verbalizes understanding of instructions and care plan provided today and agrees to view in MyChart. Active MyChart status and patient understanding of how to access instructions and care plan via MyChart confirmed with patient.     No further follow up required.  [name], BSW, MSW, Printmaker Social Work Case Set designer Health  [facility], Population Health Direct Dial: [phone]  Fax: [phone] Email: [email or web address]: [email or web address]

## 2023-02-10 NOTE — Patient Outreach (Signed)
Care Coordination   Follow Up Visit Note   02/10/2023  Name: Jason Woods MRN: [id number] DOB: 04/21/1945  Jason Woods is a 77 y.o. year old male who sees Vyas, Dhruv B, MD for primary care. I spoke with Jason Woods by phone today.  What matters to the patients health and wellness today?  Find Help in My Community.    Goals Addressed               This Visit's Progress     COMPLETED: Find Help in My Community. (pt-stated)   On track     Care Coordination Interventions:  Interventions Today    Flowsheet Row Most Recent Value  Chronic Disease   Chronic disease during today's visit Diabetes, Chronic Obstructive Pulmonary Disease (COPD), Hypertension (HTN), Atrial Fibrillation (AFib), Chronic Kidney Disease/End Stage Renal Disease (ESRD), Other  [Acute Hypoxemic Respiratory Failure, Dysphagia, Inability to Perform Activities of Daily Living Independently, Cigarette Smoker, & Moderate Protein-Calorie Malnutrition]  General Interventions   General Interventions Discussed/Reviewed General Interventions Discussed, Doctor Visits, Communication with, Level of Care, [facility], Horticulturist, commercial (DME), General Interventions Reviewed  [Communication with Care Team Members]  Doctor Visits Discussed/Reviewed Doctor Visits Discussed, Specialist, Annual Wellness Visits, Doctor Visits Reviewed, PCP  [Encouraged Routine Engagement]  Horticulturist, commercial (DME) BP Cuff, Glucomoter, Other  [Cane]  PCP/Specialist Visits Compliance with follow-up visit  [Encouraged]  Communication with PCP/Specialists, RN  [Encouraged]  Level of Care Adult Daycare, Assisted Living, Personal Care Services  Applications Personal Care Services, [place], Other  [Medicaid Recipient,  Approved for 80 Hours of CAPS]  Exercise Interventions   Exercise Discussed/Reviewed Exercise Discussed, Assistive device use and maintanence, Exercise Reviewed, Physical Activity, Weight Managment   [Encouraged]  Physical Activity Discussed/Reviewed Physical Activity Discussed, Home Exercise Program (HEP), PREP, Physical Activity Reviewed, Gym, Types of exercise  [Encouraged]  Education Interventions   Education Provided Provided Education  Provided Verbal Education On Nutrition, When to see the doctor, Mental Health/Coping with Illness, Programmer, applications, Exercise, Medication  Applications Personal Care Services, Medicaid, Other  [Medicaid Recipient,  Approved for 80 Hours of CAPS]  Mental Health Interventions   Mental Health Discussed/Reviewed Anxiety, Mental Health Discussed, Depression, Mental Health Reviewed, Grief and Loss, Substance Abuse, Coping Strategies, Suicide, Crisis  [Assessed]  Nutrition Interventions   Nutrition Discussed/Reviewed Nutrition Discussed, Adding fruits and vegetables, Nutrition Reviewed, Fluid intake, Portion sizes, Carbohydrate meal planning, Decreasing sugar intake, Decreasing salt, Decreasing fats, Increasing proteins  [Encouraged]  Pharmacy Interventions   Pharmacy Dicussed/Reviewed Pharmacy Topics Discussed, Medications and their functions, Medication Adherence, Pharmacy Topics Reviewed, Affording Medications  [Encouraged]  Safety Interventions   Safety Discussed/Reviewed Safety Discussed, Safety Reviewed  Advanced Directive Interventions   Advanced Directives Discussed/Reviewed Advanced Directives Discussed  [Advanced Directives in Place]      Active Listening & Reflection Utilized.  Verbalization of Feelings Encouraged.  Emotional Support Provided. Cognitive Behavioral Therapy Performed. Continued Review of Educational Material, "Loneliness & Isolation: Recruitment consultant" & Encouraged Engagement in Activities of Interest in [place], from List Provided.  Encouraged Engagement with [name], Licensed Clinical Social Worker with [facility] [phone]), if You Have Questions, Need Assistance, or If Additional  Social Work Needs Are Identified in The Near Future.       SDOH assessments and interventions completed:  Yes.  Care Coordination Interventions:  Yes, provided.   Follow up plan: No further intervention required.   Encounter Outcome:  Patient Visit Completed.   [name],  BSW, MSW, LCSW  Embedded Practice Social Work Case Set designer Health  [facility], Population Health Direct Dial: [phone]  Fax: [phone] Email: [email or web address] Website: [email or web address]

## 2023-02-11 ENCOUNTER — Other Ambulatory Visit (HOSPITAL_COMMUNITY): Payer: Self-pay | Admitting: Cardiology

## 2023-02-11 DIAGNOSIS — R600 Localized edema: Secondary | ICD-10-CM | POA: Diagnosis not present

## 2023-02-11 DIAGNOSIS — N179 Acute kidney failure, unspecified: Secondary | ICD-10-CM | POA: Diagnosis not present

## 2023-02-11 DIAGNOSIS — I5030 Unspecified diastolic (congestive) heart failure: Secondary | ICD-10-CM | POA: Diagnosis not present

## 2023-02-11 DIAGNOSIS — Z79899 Other long term (current) drug therapy: Secondary | ICD-10-CM | POA: Diagnosis not present

## 2023-02-11 NOTE — Telephone Encounter (Signed)
Prescription refill request for Eliquis received. Indication: afib  Last office visit: 01/15/2023, Philis Nettle  Scr: 1.45, 01/29/2023 Age:  77 yo  Weight: 73.8 kg   Refill sent.

## 2023-02-14 DIAGNOSIS — E876 Hypokalemia: Secondary | ICD-10-CM | POA: Diagnosis not present

## 2023-02-14 DIAGNOSIS — G9341 Metabolic encephalopathy: Secondary | ICD-10-CM | POA: Diagnosis not present

## 2023-02-14 DIAGNOSIS — S82892B Other fracture of left lower leg, initial encounter for open fracture type I or II: Secondary | ICD-10-CM | POA: Diagnosis not present

## 2023-02-14 DIAGNOSIS — R531 Weakness: Secondary | ICD-10-CM | POA: Diagnosis not present

## 2023-02-15 ENCOUNTER — Ambulatory Visit: Payer: 59 | Admitting: Nurse Practitioner

## 2023-02-15 DIAGNOSIS — Z299 Encounter for prophylactic measures, unspecified: Secondary | ICD-10-CM | POA: Diagnosis not present

## 2023-02-15 DIAGNOSIS — J069 Acute upper respiratory infection, unspecified: Secondary | ICD-10-CM | POA: Diagnosis not present

## 2023-02-15 DIAGNOSIS — R0981 Nasal congestion: Secondary | ICD-10-CM | POA: Diagnosis not present

## 2023-02-15 NOTE — Progress Notes (Deleted)
Cardiology Office Note:  .   Date:  01/15/2023 ID:  Jason Woods, DOB [birth year], MRN [id number] PCP: Ignatius Specking, MD  [facility] Providers Cardiologist:  Dina Rich, MD    History of Present Illness: [name]   Jason Woods is a 77 y.o. male with PMH of A-fib/A-flutter, COPD, hypertension, type 2 diabetes, CKD, high cholesterol, anxiety, and chronic back pain, who presents today for scheduled office visit.  Last seen by Dr. Dina Rich on December 11, 2021.  He was doing well at the time from a cardiac perspective.  Today he presents for scheduled follow-up.  PCP has referred him for evaluation for heart failure, has some leg swelling.  Today he states that about 1 month ago, while on gabapentin he noticed leg edema, was having weeping of the skin.  Was prescribed a diuretic by his PCP and leg swelling went away, has not returned to gabapentin since.  He attributes his leg swelling to gabapentin.  Overall says he is doing well at the current moment. Denies any chest pain, shortness of breath, palpitations, syncope, presyncope, dizziness, orthopnea, PND, swelling or significant weight changes, acute bleeding, or claudication.  Smokes 1 pack of cigarettes per week.  ROS: Negative.  See HPI.  Studies Reviewed: .    Echo 10/2021: 1. Left ventricular ejection fraction, by estimation, is 50 to 55%. The  left ventricle has low normal function. The left ventricle demonstrates  global hypokinesis. There is mild asymmetric left ventricular hypertrophy of the septal segment. Left  ventricular diastolic parameters are indeterminate. The average left  ventricular global longitudinal strain is -14.1 %. The global longitudinal strain is abnormal.   2. RV-RA gradient normal at 13 mmHg suggesting normal RVSP in the setting of normal CVP. Right ventricular systolic function is normal. The right ventricular size is normal.   3. Left atrial size was upper normal.   4. The mitral  valve is degenerative. Mild mitral valve regurgitation.   5. The aortic valve is tricuspid. There is mild calcification of the  aortic valve. Aortic valve regurgitation is not visualized. Aortic valve  sclerosis/calcification is present, without any evidence of aortic  stenosis.   6. Unable to estimate CVP.   Comparison(s): Prior images unable to be directly viewed.  Physical Exam:   VS:  There were no vitals taken for this visit.   Wt Readings from Last 3 Encounters:  01/15/23 162 lb 12.8 oz (73.8 kg)  11/30/22 175 lb (79.4 kg)  06/23/22 157 lb 13.6 oz (71.6 kg)    GEN: Well nourished, well developed in no acute distress NECK: No JVD; No carotid bruits CARDIAC: S1/S2, RRR, no murmurs, rubs, gallops RESPIRATORY:  Clear to auscultation with inspiratory wheezing noted along right posterior lung fields, otherwise unremarkable  ABDOMEN: Soft, non-tender, non-distended EXTREMITIES:  No edema; No deformity   ASSESSMENT AND PLAN: .    HFpEF, leg edema, medication management Stage C, NYHA class I symptoms. Recent episode of leg edema, but states this has resolved since stopping gabapentin. Labwork with PCP revealed proBNP 29,090, with repeat BNP 13,499. Euvolemic and well compensated on exam. Continue current medication regimen.  Will obtain echocardiogram and arrange the following labs to be drawn: proBNP, magnesium, and BMET. Low sodium diet, fluid restriction <2L, and daily weights encouraged. Educated to contact our office for weight gain of 2 lbs overnight or 5 lbs in one week.  A-fib/A-flutter Denies any tachycardia or palpitations. HR well controlled today. Continue current medication regimen.  Denies any bleeding issues on Eliquis.  Continue Eliquis for stroke prevention.  HTN Blood pressure well-controlled. Discussed to monitor BP at home at least 2 hours after medications and sitting for 5-10 minutes.  No medication changes at this time. Heart healthy diet and regular cardiovascular  exercise encouraged.   4. HLD LDL 61 01/2022.  Continue current medication regimen.  Being managed by PCP. Heart healthy diet and regular cardiovascular exercise encouraged.  Continue to follow with PCP.  Dispo: Follow-up with me/APP in 4 to 6 weeks or sooner if anything changes.  Signed, Sharlene Dory, NP

## 2023-02-22 NOTE — Telephone Encounter (Signed)
Noted  

## 2023-02-26 DIAGNOSIS — R21 Rash and other nonspecific skin eruption: Secondary | ICD-10-CM | POA: Diagnosis not present

## 2023-02-26 DIAGNOSIS — I5022 Chronic systolic (congestive) heart failure: Secondary | ICD-10-CM | POA: Diagnosis not present

## 2023-02-26 DIAGNOSIS — Z299 Encounter for prophylactic measures, unspecified: Secondary | ICD-10-CM | POA: Diagnosis not present

## 2023-02-26 DIAGNOSIS — R52 Pain, unspecified: Secondary | ICD-10-CM | POA: Diagnosis not present

## 2023-02-26 DIAGNOSIS — L97519 Non-pressure chronic ulcer of other part of right foot with unspecified severity: Secondary | ICD-10-CM | POA: Diagnosis not present

## 2023-02-26 DIAGNOSIS — J9611 Chronic respiratory failure with hypoxia: Secondary | ICD-10-CM | POA: Diagnosis not present

## 2023-02-26 DIAGNOSIS — I10 Essential (primary) hypertension: Secondary | ICD-10-CM | POA: Diagnosis not present

## 2023-02-27 DIAGNOSIS — E11610 Type 2 diabetes mellitus with diabetic neuropathic arthropathy: Secondary | ICD-10-CM | POA: Diagnosis not present

## 2023-02-27 DIAGNOSIS — M179 Osteoarthritis of knee, unspecified: Secondary | ICD-10-CM | POA: Diagnosis not present

## 2023-03-16 DIAGNOSIS — S82892B Other fracture of left lower leg, initial encounter for open fracture type I or II: Secondary | ICD-10-CM | POA: Diagnosis not present

## 2023-03-16 DIAGNOSIS — R531 Weakness: Secondary | ICD-10-CM | POA: Diagnosis not present

## 2023-03-16 DIAGNOSIS — E876 Hypokalemia: Secondary | ICD-10-CM | POA: Diagnosis not present

## 2023-03-16 DIAGNOSIS — G9341 Metabolic encephalopathy: Secondary | ICD-10-CM | POA: Diagnosis not present

## 2023-03-18 ENCOUNTER — Ambulatory Visit: Payer: 59 | Attending: Nurse Practitioner | Admitting: Nurse Practitioner

## 2023-03-18 VITALS — BP 116/64 | HR 52 | Ht 67.0 in | Wt 166.0 lb

## 2023-03-18 DIAGNOSIS — E785 Hyperlipidemia, unspecified: Secondary | ICD-10-CM | POA: Diagnosis not present

## 2023-03-18 DIAGNOSIS — Z720 Tobacco use: Secondary | ICD-10-CM | POA: Diagnosis not present

## 2023-03-18 DIAGNOSIS — I4891 Unspecified atrial fibrillation: Secondary | ICD-10-CM | POA: Diagnosis not present

## 2023-03-18 DIAGNOSIS — I10 Essential (primary) hypertension: Secondary | ICD-10-CM | POA: Diagnosis not present

## 2023-03-18 DIAGNOSIS — I5022 Chronic systolic (congestive) heart failure: Secondary | ICD-10-CM | POA: Diagnosis not present

## 2023-03-18 DIAGNOSIS — Z79899 Other long term (current) drug therapy: Secondary | ICD-10-CM | POA: Diagnosis not present

## 2023-03-18 DIAGNOSIS — I4892 Unspecified atrial flutter: Secondary | ICD-10-CM

## 2023-03-18 NOTE — Patient Instructions (Addendum)
Medication Instructions:  Your physician recommends that you continue on your current medications as directed. Please refer to the Current Medication list given to you today.  Labwork: In 2 weeks at [facility]  Testing/Procedures: None   Follow-Up: Your physician recommends that you schedule a follow-up appointment in: 6 weeks   Any Other Special Instructions Will Be Listed Below (If Applicable). ;ty   If you need a refill on your cardiac medications before your next appointment, please call your pharmacy.

## 2023-03-18 NOTE — Progress Notes (Addendum)
Cardiology Office Note:  .   Date:  03/18/2023 ID:  Jason Woods, DOB 08/04/1945, MRN [id number] PCP: Ignatius Specking, MD  [facility] Providers Cardiologist:  Dina Rich, MD    History of Present Illness: Jason Woods   Jason Woods is a 77 y.o. male with PMH of A-fib/A-flutter, COPD, hypertension, type 2 diabetes, CKD, high cholesterol, anxiety, and chronic back pain, who presents today for scheduled office visit.  Last seen by Dr. Dina Rich on December 11, 2021.  He was doing well at the time from a cardiac perspective.  I last saw patient for scheduled follow-up on January 15, 2023.  PCP referred him for evaluation for heart failure, had some leg swelling.  Pt stated that about 1 month ago, while on gabapentin he noticed leg edema, was having weeping of the skin.  Was prescribed a diuretic by his PCP and leg swelling went away, had not returned to gabapentin since.  He attributed his leg swelling to gabapentin. He denied any chest pain, shortness of breath, palpitations, syncope, presyncope, dizziness, orthopnea, PND, swelling or significant weight changes, acute bleeding, or claudication.  TTE was arranged along with labs - see TTE result below.   Today he presents for follow-up regarding diagnosis of HFmrEF. He is doing well and denies any acute cardiac complaints or concerns. Wife believes he is having an allergic reaction to Eliquis, has recently been treated by PCP for this, caused his skin to break out along his right lower leg. Denies any chest pain, shortness of breath, palpitations, syncope, presyncope, dizziness, orthopnea, PND, swelling or significant weight changes, acute bleeding, or claudication.   SH: Smokes 1 pack of cigarettes per week.  ROS: Negative.  See HPI.  Studies Reviewed: Jason Woods    TTE 01/2023:  1. Left ventricular ejection fraction, by estimation, is 40 to 45%. The  left ventricle has mildly decreased function. The left ventricle  demonstrates  global hypokinesis. There is mild left ventricular  hypertrophy. Left ventricular diastolic parameters  are consistent with Grade II diastolic dysfunction (pseudonormalization).  Elevated left atrial pressure.   2. Right ventricular systolic function is normal. The right ventricular  size is normal.   3. Left atrial size was mild to moderately dilated.   4. The mitral valve is abnormal. Mild to moderate mitral valve  regurgitation. No evidence of mitral stenosis.   5. The aortic valve is tricuspid. Aortic valve regurgitation is not  visualized. No aortic stenosis is present.   6. The inferior vena cava is normal in size with greater than 50%  respiratory variability, suggesting right atrial pressure of 3 mmHg.   Comparison(s): EF 50-55%. Mild LVH. Mild mitral regurgitation.   Echo 10/2021: 1. Left ventricular ejection fraction, by estimation, is 50 to 55%. The  left ventricle has low normal function. The left ventricle demonstrates  global hypokinesis. There is mild asymmetric left ventricular hypertrophy of the septal segment. Left  ventricular diastolic parameters are indeterminate. The average left  ventricular global longitudinal strain is -14.1 %. The global longitudinal strain is abnormal.   2. RV-RA gradient normal at 13 mmHg suggesting normal RVSP in the setting of normal CVP. Right ventricular systolic function is normal. The right ventricular size is normal.   3. Left atrial size was upper normal.   4. The mitral valve is degenerative. Mild mitral valve regurgitation.   5. The aortic valve is tricuspid. There is mild calcification of the  aortic valve. Aortic valve regurgitation is not visualized.  Aortic valve  sclerosis/calcification is present, without any evidence of aortic  stenosis.   6. Unable to estimate CVP.   Comparison(s): Prior images unable to be directly viewed.  Physical Exam:   VS:  BP 116/64 (BP Location: Left Arm)   Pulse (!) 52   Ht 5\' 7"  (1.702 m)   Wt  166 lb (75.3 kg)   SpO2 92%   BMI 26.00 kg/m    Wt Readings from Last 3 Encounters:  03/18/23 166 lb (75.3 kg)  01/15/23 162 lb 12.8 oz (73.8 kg)  11/30/22 175 lb (79.4 kg)    GEN: Well nourished, well developed in no acute distress NECK: No JVD; No carotid bruits CARDIAC: S1/S2, slow rate and regular rhythm, no murmurs, rubs, gallops RESPIRATORY:  Clear and diminished to auscultation without adventitious breath sounds ABDOMEN: Soft, non-tender, non-distended EXTREMITIES:  No edema; No deformity   ASSESSMENT AND PLAN: .    HFmrEF, medication management Stage C, NYHA class I symptoms. Most recent BNP 5,022, improved from BNP with PCP that was 29,090 originally. Euvolemic and well compensated on exam. Continue current medication regimen. Will obtain labs for comparison and to look at volume overload as he remains on Lasix 40 mg daily - will obtain BMET, proBNP, and Mag. Low sodium diet, fluid restriction <2L, and daily weights encouraged. Educated to contact our office for weight gain of 2 lbs overnight or 5 lbs in one week.  A-fib/A-flutter Denies any tachycardia or palpitations. HR slightly low at 52 bpm, but asymptomatic. Continue current medication regimen. Denies any bleeding issues on Eliquis.  Continue Eliquis for stroke prevention. Confirmed with patient and wife that past skin reaction was not to Eliquis as he did not have this reaction previously and to continue to f/u with PCP regarding this.  HTN Blood pressure well-controlled. Discussed to monitor BP at home at least 2 hours after medications and sitting for 5-10 minutes.  No medication changes at this time. Heart healthy diet and regular cardiovascular exercise encouraged.   4. HLD LDL 60 01/2023.  Continue current medication regimen.  Being managed by PCP. Heart healthy diet and regular cardiovascular exercise encouraged.  Continue to follow with PCP.  5. Tobacco use Smoking cessation encouraged and discussed.   Dispo:  Follow-up with me/APP in 6 weeks or sooner if anything changes.  Signed, Sharlene Dory, NP

## 2023-03-30 DIAGNOSIS — E11610 Type 2 diabetes mellitus with diabetic neuropathic arthropathy: Secondary | ICD-10-CM | POA: Diagnosis not present

## 2023-03-30 DIAGNOSIS — M179 Osteoarthritis of knee, unspecified: Secondary | ICD-10-CM | POA: Diagnosis not present

## 2023-03-31 DIAGNOSIS — Z79899 Other long term (current) drug therapy: Secondary | ICD-10-CM | POA: Diagnosis not present

## 2023-04-06 DIAGNOSIS — M542 Cervicalgia: Secondary | ICD-10-CM | POA: Diagnosis not present

## 2023-04-06 DIAGNOSIS — M4802 Spinal stenosis, cervical region: Secondary | ICD-10-CM | POA: Diagnosis not present

## 2023-04-06 DIAGNOSIS — G894 Chronic pain syndrome: Secondary | ICD-10-CM | POA: Diagnosis not present

## 2023-04-06 DIAGNOSIS — M549 Dorsalgia, unspecified: Secondary | ICD-10-CM | POA: Diagnosis not present

## 2023-04-06 DIAGNOSIS — Z79891 Long term (current) use of opiate analgesic: Secondary | ICD-10-CM | POA: Diagnosis not present

## 2023-04-16 DIAGNOSIS — E876 Hypokalemia: Secondary | ICD-10-CM | POA: Diagnosis not present

## 2023-04-16 DIAGNOSIS — R531 Weakness: Secondary | ICD-10-CM | POA: Diagnosis not present

## 2023-04-16 DIAGNOSIS — S82892B Other fracture of left lower leg, initial encounter for open fracture type I or II: Secondary | ICD-10-CM | POA: Diagnosis not present

## 2023-04-16 DIAGNOSIS — G9341 Metabolic encephalopathy: Secondary | ICD-10-CM | POA: Diagnosis not present

## 2023-04-21 DIAGNOSIS — D485 Neoplasm of uncertain behavior of skin: Secondary | ICD-10-CM | POA: Diagnosis not present

## 2023-04-21 DIAGNOSIS — L570 Actinic keratosis: Secondary | ICD-10-CM | POA: Diagnosis not present

## 2023-04-21 DIAGNOSIS — C44321 Squamous cell carcinoma of skin of nose: Secondary | ICD-10-CM | POA: Diagnosis not present

## 2023-04-22 DIAGNOSIS — J9611 Chronic respiratory failure with hypoxia: Secondary | ICD-10-CM | POA: Diagnosis not present

## 2023-04-22 DIAGNOSIS — E1165 Type 2 diabetes mellitus with hyperglycemia: Secondary | ICD-10-CM | POA: Diagnosis not present

## 2023-04-22 DIAGNOSIS — I10 Essential (primary) hypertension: Secondary | ICD-10-CM | POA: Diagnosis not present

## 2023-04-22 DIAGNOSIS — F17210 Nicotine dependence, cigarettes, uncomplicated: Secondary | ICD-10-CM | POA: Diagnosis not present

## 2023-04-22 DIAGNOSIS — Z299 Encounter for prophylactic measures, unspecified: Secondary | ICD-10-CM | POA: Diagnosis not present

## 2023-04-22 DIAGNOSIS — I480 Paroxysmal atrial fibrillation: Secondary | ICD-10-CM | POA: Diagnosis not present

## 2023-04-22 DIAGNOSIS — I5022 Chronic systolic (congestive) heart failure: Secondary | ICD-10-CM | POA: Diagnosis not present

## 2023-04-30 DIAGNOSIS — E11610 Type 2 diabetes mellitus with diabetic neuropathic arthropathy: Secondary | ICD-10-CM | POA: Diagnosis not present

## 2023-04-30 DIAGNOSIS — M179 Osteoarthritis of knee, unspecified: Secondary | ICD-10-CM | POA: Diagnosis not present

## 2023-05-03 ENCOUNTER — Ambulatory Visit: Payer: 59 | Admitting: Nurse Practitioner

## 2023-05-05 ENCOUNTER — Ambulatory Visit: Payer: 59 | Attending: Nurse Practitioner | Admitting: Nurse Practitioner

## 2023-05-05 ENCOUNTER — Encounter: Payer: Self-pay | Admitting: Nurse Practitioner

## 2023-05-05 VITALS — BP 120/62 | HR 65 | Ht 67.0 in | Wt 163.8 lb

## 2023-05-05 DIAGNOSIS — N1832 Chronic kidney disease, stage 3b: Secondary | ICD-10-CM

## 2023-05-05 DIAGNOSIS — I5022 Chronic systolic (congestive) heart failure: Secondary | ICD-10-CM | POA: Diagnosis not present

## 2023-05-05 DIAGNOSIS — E785 Hyperlipidemia, unspecified: Secondary | ICD-10-CM | POA: Diagnosis not present

## 2023-05-05 DIAGNOSIS — I4892 Unspecified atrial flutter: Secondary | ICD-10-CM | POA: Diagnosis not present

## 2023-05-05 DIAGNOSIS — I4891 Unspecified atrial fibrillation: Secondary | ICD-10-CM | POA: Diagnosis not present

## 2023-05-05 DIAGNOSIS — I10 Essential (primary) hypertension: Secondary | ICD-10-CM

## 2023-05-05 DIAGNOSIS — Z79899 Other long term (current) drug therapy: Secondary | ICD-10-CM | POA: Diagnosis not present

## 2023-05-05 DIAGNOSIS — Z720 Tobacco use: Secondary | ICD-10-CM | POA: Diagnosis not present

## 2023-05-05 MED ORDER — DAPAGLIFLOZIN PROPANEDIOL 10 MG PO TABS: 10.0000 mg | ORAL_TABLET | Freq: Every day | ORAL | 11 refills | Status: DC

## 2023-05-05 NOTE — Patient Instructions (Signed)
Medication Instructions:  Your physician has recommended you make the following change in your medication:   -Start Farxiga 10 mg tablet once daily   *If you need a refill on your cardiac medications before your next appointment, please call your pharmacy*   Lab Work: In 1-2 weeks:  -BMET at [facility])   If you have labs (blood work) drawn today and your tests are completely normal, you will receive your results only by: [employer] (if you have MyChart) OR A paper copy in the mail If you have any lab test that is abnormal or we need to change your treatment, we will call you to review the results.   Testing/Procedures: None   Follow-Up: At [facility], you and your health needs are our priority.  As part of our continuing mission to provide you with exceptional heart care, we have created designated Provider Care Teams.  These Care Teams include your primary Cardiologist (physician) and Advanced Practice Providers (APPs -  Physician Assistants and Nurse Practitioners) who all work together to provide you with the care you need, when you need it.  We recommend signing up for the patient portal called "MyChart".  Sign up information is provided on this After Visit Summary.  MyChart is used to connect with patients for Virtual Visits (Telemedicine).  Patients are able to view lab/test results, encounter notes, upcoming appointments, etc.  Non-urgent messages can be sent to your provider as well.   To learn more about what you can do with MyChart, go to [email or web address].    Your next appointment:   6 week(s)  Provider:   You will see one of the following Advanced Practice Providers on your designated Care Team:   Sharlene Dory, NP  Other Instructions Heart Failure Action Plan A heart failure action plan helps you know what to do when you have symptoms of heart failure. Your action plan is a color-coded plan that lists the symptoms to  watch for and indicates what actions to take. If you have symptoms in the green zone, you're doing well. If you have symptoms in the yellow zone, you're having problems. If you have symptoms in the red zone, you need medical care right away. Follow the plan that was created by you and your health care provider. Review your plan each time you visit your provider. Green zone These signs mean you're doing well and can continue what you're doing: You don't have new or worsening shortness of breath. You have very little swelling or no new swelling. Your weight is stable (no gain or loss). You have a normal activity level. You don't have chest pain or any other new symptoms. Yellow zone These signs and symptoms mean your condition may be getting worse and you should make some changes: You have trouble breathing when you're active. You have swelling in your feet or legs or have discomfort in your belly. You gain 2-3 lb (0.9-1.4 kg) in 24 hours, or 5 lb (2.3 kg) in a week. This amount may be more or less depending on your condition. You get tired easily. You have trouble sleeping. You have a dry cough. If you have any of these symptoms: Contact your provider within the next day. Your provider may adjust your medicines. Red zone These signs and symptoms mean you should get medical help right away: You have trouble breathing when resting or cannot lie flat and you need to raise your head to help you breathe. You have  a dry cough that's getting worse. You have swelling or pain in your feet or legs or discomfort in your belly that's getting worse. You suddenly gain more than 2-3 lb (0.9-1.4 kg) in 24 hours, or more than 5 lb (2.3 kg) in a week. This amount may be more or less depending on your condition. You have trouble staying awake or you feel confused. You don't have an appetite. You have worsening sadness or depression. These symptoms may be an emergency. Call 911 right away. Do not wait to  see if the symptoms will go away. Do not drive yourself to the hospital. Follow these instructions at home: Take medicines only as told. Eat a heart-healthy diet. Work with a dietitian to create an eating plan that's best for you. Weigh yourself each day. Your target weight is __________ lb (__________ kg). Call your provider if you gain more than __________ lb (__________ kg) in 24 hours, or more than __________ lb (__________ kg) in a week. Health care provider name: _____________________________________________________ Health care provider phone number: _____________________________________________________ Where to find more information American Heart Association: heart.org This information is not intended to replace advice given to you by your health care provider. Make sure you discuss any questions you have with your health care provider. Document Revised: 10/22/2022 Document Reviewed: 10/22/2022 Elsevier Patient Education  2024 [employer].

## 2023-05-05 NOTE — Progress Notes (Addendum)
Cardiology Office Note:  .   Date:  05/05/2023 ID:  Jason Woods, DOB 1945-08-04, MRN [id number] PCP: Jason Specking, MD  [facility] Providers Cardiologist:  Jason Rich, MD    History of Present Illness: Jason Woods   Jason Woods is a 78 y.o. male with PMH of HFmrEF, A-fib/A-flutter, COPD, hypertension, type 2 diabetes, CKD, high cholesterol, anxiety, and chronic back pain, who presents today for scheduled office visit.  Last seen by Dr. Dina Woods on December 11, 2021.  He was doing well at the time from a cardiac perspective.  I last saw patient for scheduled follow-up on January 15, 2023.  PCP referred him for evaluation for heart failure, had some leg swelling.  Pt stated that about 1 month ago, while on gabapentin he noticed leg edema, was having weeping of the skin.  Was prescribed a diuretic by his PCP and leg swelling went away, had not returned to gabapentin since.  He attributed his leg swelling to gabapentin. He denied any chest pain, shortness of breath, palpitations, syncope, presyncope, dizziness, orthopnea, PND, swelling or significant weight changes, acute bleeding, or claudication.  TTE was arranged along with labs - see TTE result below.   03/18/2023 - Today he presents for follow-up regarding diagnosis of HFmrEF. He is doing well and denies any acute cardiac complaints or concerns. Sister believes he is having an allergic reaction to Eliquis, has recently been treated by PCP for this, caused his skin to break out along his right lower leg. Denies any chest pain, shortness of breath, palpitations, syncope, presyncope, dizziness, orthopnea, PND, swelling or significant weight changes, acute bleeding, or claudication.   05/05/2023 - He presents today for follow-up with his sister.  He is doing well and denies any acute cardiac complaints or concerns today.  He tells me his skin reaction has cleared up, PCP has treated this.  He wants to know if this is due to  Eliquis, denies any new rash/blisters while taking Eliquis. He tells me his swelling has improved. Denies any chest pain, shortness of breath, palpitations, syncope, presyncope, dizziness, orthopnea, PND, significant weight changes, acute bleeding, or claudication.  SH: Smokes 1 pack of cigarettes per week.  ROS: Negative.  See HPI.  Studies Reviewed: Jason Woods    TTE 01/2023:  1. Left ventricular ejection fraction, by estimation, is 40 to 45%. The  left ventricle has mildly decreased function. The left ventricle  demonstrates global hypokinesis. There is mild left ventricular  hypertrophy. Left ventricular diastolic parameters  are consistent with Grade II diastolic dysfunction (pseudonormalization).  Elevated left atrial pressure.   2. Right ventricular systolic function is normal. The right ventricular  size is normal.   3. Left atrial size was mild to moderately dilated.   4. The mitral valve is abnormal. Mild to moderate mitral valve  regurgitation. No evidence of mitral stenosis.   5. The aortic valve is tricuspid. Aortic valve regurgitation is not  visualized. No aortic stenosis is present.   6. The inferior vena cava is normal in size with greater than 50%  respiratory variability, suggesting right atrial pressure of 3 mmHg.   Comparison(s): EF 50-55%. Mild LVH. Mild mitral regurgitation.   Echo 10/2021: 1. Left ventricular ejection fraction, by estimation, is 50 to 55%. The  left ventricle has low normal function. The left ventricle demonstrates  global hypokinesis. There is mild asymmetric left ventricular hypertrophy of the septal segment. Left  ventricular diastolic parameters are indeterminate. The average left  ventricular global longitudinal strain is -14.1 %. The global longitudinal strain is abnormal.   2. RV-RA gradient normal at 13 mmHg suggesting normal RVSP in the setting of normal CVP. Right ventricular systolic function is normal. The right ventricular size is normal.    3. Left atrial size was upper normal.   4. The mitral valve is degenerative. Mild mitral valve regurgitation.   5. The aortic valve is tricuspid. There is mild calcification of the  aortic valve. Aortic valve regurgitation is not visualized. Aortic valve  sclerosis/calcification is present, without any evidence of aortic  stenosis.   6. Unable to estimate CVP.   Comparison(s): Prior images unable to be directly viewed.  Physical Exam:   VS:  BP 120/62   Pulse 65   Ht 5\' 7"  (1.702 m)   Wt 163 lb 12.8 oz (74.3 kg)   SpO2 97%   BMI 25.65 kg/m    Wt Readings from Last 3 Encounters:  05/05/23 163 lb 12.8 oz (74.3 kg)  03/18/23 166 lb (75.3 kg)  01/15/23 162 lb 12.8 oz (73.8 kg)    GEN: Well nourished, well developed in no acute distress NECK: No JVD; No carotid bruits CARDIAC: S1/S2, slow rate and regular rhythm, no murmurs, rubs, gallops RESPIRATORY:  Clear and diminished to auscultation without adventitious breath sounds ABDOMEN: Soft, non-tender, non-distended EXTREMITIES:  Trace edema to BLE; No deformity   ASSESSMENT AND PLAN: .    HFmrEF, medication management Stage C, NYHA class I symptoms. Euvolemic and well compensated on exam overall, trace edema noted to BLE.  Will initiate SGLT2 inhibitor, Farxiga and repeat BMET in 1-2 weeks. Continue rest of medication regimen.  Low sodium diet, fluid restriction <2L, and daily weights encouraged. Educated to contact our office for weight gain of 2 lbs overnight or 5 lbs in one week.  If kidney function permits and blood pressure is within normal limits, plan to consider switching losartan to Entresto at next office visit.  Will continue to work on medication titration over the next few months and repeat echocardiogram within 3 months.  A-fib/A-flutter Denies any tachycardia or palpitations. HR well controlled today, but asymptomatic. Continue current medication regimen. Denies any bleeding issues on Eliquis.  Continue Eliquis for  stroke prevention, previous skin reaction has cleared and I stated I highly doubt this is due to Eliquis however, we will continue to monitor.   HTN Blood pressure well-controlled. Discussed to monitor BP at home at least 2 hours after medications and sitting for 5-10 minutes.  No medication changes at this time. Heart healthy diet and regular cardiovascular exercise encouraged.   4. HLD LDL 60 01/2023.  Continue current medication regimen.  Being managed by PCP. Heart healthy diet and regular cardiovascular exercise encouraged.  Continue to follow with PCP.  5. Tobacco use Smoking cessation encouraged and discussed.   6.  CKD stage 3b Most recent labs on file are stable.  Avoid nephrotoxic agents.  Will repeat BMET in 1 to 2 weeks after starting Farxiga-see above.  No other medication changes at this time.  Continue follow-up with PCP.  Dispo: Follow-up with me/APP in 6 weeks or sooner if anything changes.  Signed, Sharlene Dory, NP

## 2023-05-06 DIAGNOSIS — C44321 Squamous cell carcinoma of skin of nose: Secondary | ICD-10-CM | POA: Diagnosis not present

## 2023-05-17 DIAGNOSIS — R531 Weakness: Secondary | ICD-10-CM | POA: Diagnosis not present

## 2023-05-17 DIAGNOSIS — G9341 Metabolic encephalopathy: Secondary | ICD-10-CM | POA: Diagnosis not present

## 2023-05-17 DIAGNOSIS — E876 Hypokalemia: Secondary | ICD-10-CM | POA: Diagnosis not present

## 2023-05-17 DIAGNOSIS — S82892B Other fracture of left lower leg, initial encounter for open fracture type I or II: Secondary | ICD-10-CM | POA: Diagnosis not present

## 2023-05-19 DIAGNOSIS — E785 Hyperlipidemia, unspecified: Secondary | ICD-10-CM | POA: Diagnosis not present

## 2023-05-19 DIAGNOSIS — I63423 Cerebral infarction due to embolism of bilateral anterior cerebral arteries: Secondary | ICD-10-CM | POA: Diagnosis not present

## 2023-05-19 DIAGNOSIS — Z794 Long term (current) use of insulin: Secondary | ICD-10-CM | POA: Diagnosis not present

## 2023-05-19 DIAGNOSIS — Z79899 Other long term (current) drug therapy: Secondary | ICD-10-CM | POA: Diagnosis not present

## 2023-05-19 DIAGNOSIS — E11649 Type 2 diabetes mellitus with hypoglycemia without coma: Secondary | ICD-10-CM | POA: Diagnosis not present

## 2023-05-19 DIAGNOSIS — F17210 Nicotine dependence, cigarettes, uncomplicated: Secondary | ICD-10-CM | POA: Diagnosis not present

## 2023-05-19 DIAGNOSIS — I10 Essential (primary) hypertension: Secondary | ICD-10-CM | POA: Diagnosis not present

## 2023-05-19 DIAGNOSIS — E871 Hypo-osmolality and hyponatremia: Secondary | ICD-10-CM | POA: Diagnosis not present

## 2023-05-28 DIAGNOSIS — M179 Osteoarthritis of knee, unspecified: Secondary | ICD-10-CM | POA: Diagnosis not present

## 2023-05-28 DIAGNOSIS — E11610 Type 2 diabetes mellitus with diabetic neuropathic arthropathy: Secondary | ICD-10-CM | POA: Diagnosis not present

## 2023-05-31 ENCOUNTER — Telehealth: Payer: Self-pay | Admitting: Nurse Practitioner

## 2023-05-31 DIAGNOSIS — E876 Hypokalemia: Secondary | ICD-10-CM

## 2023-05-31 DIAGNOSIS — N179 Acute kidney failure, unspecified: Secondary | ICD-10-CM

## 2023-05-31 NOTE — Telephone Encounter (Signed)
 Pt is calling to get lab results.

## 2023-05-31 NOTE — Telephone Encounter (Signed)
 Kidney function stable. BNP level has improved, but still shows that he is volume overloaded. Most recent sodium level is low at 120, was felt that this was a lab error after reading notes of ED stay at [facility]. Please repeat BMET in 1 week for diagnosis of hyponatremia. Thanks! Sharlene Dory, AGNP-C  Labs sent to [facility]

## 2023-06-01 DIAGNOSIS — M549 Dorsalgia, unspecified: Secondary | ICD-10-CM | POA: Diagnosis not present

## 2023-06-01 DIAGNOSIS — G894 Chronic pain syndrome: Secondary | ICD-10-CM | POA: Diagnosis not present

## 2023-06-01 DIAGNOSIS — Z79891 Long term (current) use of opiate analgesic: Secondary | ICD-10-CM | POA: Diagnosis not present

## 2023-06-01 DIAGNOSIS — M542 Cervicalgia: Secondary | ICD-10-CM | POA: Diagnosis not present

## 2023-06-01 DIAGNOSIS — M4802 Spinal stenosis, cervical region: Secondary | ICD-10-CM | POA: Diagnosis not present

## 2023-06-07 DIAGNOSIS — N179 Acute kidney failure, unspecified: Secondary | ICD-10-CM | POA: Diagnosis not present

## 2023-06-07 DIAGNOSIS — E876 Hypokalemia: Secondary | ICD-10-CM | POA: Diagnosis not present

## 2023-06-08 DIAGNOSIS — Z794 Long term (current) use of insulin: Secondary | ICD-10-CM | POA: Diagnosis not present

## 2023-06-08 DIAGNOSIS — Z961 Presence of intraocular lens: Secondary | ICD-10-CM | POA: Diagnosis not present

## 2023-06-08 DIAGNOSIS — E113513 Type 2 diabetes mellitus with proliferative diabetic retinopathy with macular edema, bilateral: Secondary | ICD-10-CM | POA: Diagnosis not present

## 2023-06-08 DIAGNOSIS — H40001 Preglaucoma, unspecified, right eye: Secondary | ICD-10-CM | POA: Diagnosis not present

## 2023-06-08 DIAGNOSIS — D3132 Benign neoplasm of left choroid: Secondary | ICD-10-CM | POA: Diagnosis not present

## 2023-06-08 DIAGNOSIS — Z7984 Long term (current) use of oral hypoglycemic drugs: Secondary | ICD-10-CM | POA: Diagnosis not present

## 2023-06-11 DIAGNOSIS — I5022 Chronic systolic (congestive) heart failure: Secondary | ICD-10-CM | POA: Diagnosis not present

## 2023-06-11 DIAGNOSIS — E1142 Type 2 diabetes mellitus with diabetic polyneuropathy: Secondary | ICD-10-CM | POA: Diagnosis not present

## 2023-06-11 DIAGNOSIS — Z299 Encounter for prophylactic measures, unspecified: Secondary | ICD-10-CM | POA: Diagnosis not present

## 2023-06-11 DIAGNOSIS — M14679 Charcot's joint, unspecified ankle and foot: Secondary | ICD-10-CM | POA: Diagnosis not present

## 2023-06-11 DIAGNOSIS — J019 Acute sinusitis, unspecified: Secondary | ICD-10-CM | POA: Diagnosis not present

## 2023-06-11 DIAGNOSIS — I10 Essential (primary) hypertension: Secondary | ICD-10-CM | POA: Diagnosis not present

## 2023-06-14 DIAGNOSIS — S82892B Other fracture of left lower leg, initial encounter for open fracture type I or II: Secondary | ICD-10-CM | POA: Diagnosis not present

## 2023-06-14 DIAGNOSIS — R531 Weakness: Secondary | ICD-10-CM | POA: Diagnosis not present

## 2023-06-14 DIAGNOSIS — G9341 Metabolic encephalopathy: Secondary | ICD-10-CM | POA: Diagnosis not present

## 2023-06-14 DIAGNOSIS — E876 Hypokalemia: Secondary | ICD-10-CM | POA: Diagnosis not present

## 2023-06-28 DIAGNOSIS — E11610 Type 2 diabetes mellitus with diabetic neuropathic arthropathy: Secondary | ICD-10-CM | POA: Diagnosis not present

## 2023-06-28 DIAGNOSIS — M179 Osteoarthritis of knee, unspecified: Secondary | ICD-10-CM | POA: Diagnosis not present

## 2023-07-05 ENCOUNTER — Encounter: Payer: Self-pay | Admitting: Nurse Practitioner

## 2023-07-05 ENCOUNTER — Ambulatory Visit: Payer: 59 | Attending: Nurse Practitioner | Admitting: Nurse Practitioner

## 2023-07-05 VITALS — BP 138/80 | HR 64 | Ht 67.0 in | Wt 156.0 lb

## 2023-07-05 DIAGNOSIS — I5022 Chronic systolic (congestive) heart failure: Secondary | ICD-10-CM | POA: Diagnosis not present

## 2023-07-05 DIAGNOSIS — N1832 Chronic kidney disease, stage 3b: Secondary | ICD-10-CM

## 2023-07-05 DIAGNOSIS — I4891 Unspecified atrial fibrillation: Secondary | ICD-10-CM | POA: Diagnosis not present

## 2023-07-05 DIAGNOSIS — I10 Essential (primary) hypertension: Secondary | ICD-10-CM

## 2023-07-05 DIAGNOSIS — E785 Hyperlipidemia, unspecified: Secondary | ICD-10-CM | POA: Diagnosis not present

## 2023-07-05 DIAGNOSIS — I4892 Unspecified atrial flutter: Secondary | ICD-10-CM

## 2023-07-05 DIAGNOSIS — Z720 Tobacco use: Secondary | ICD-10-CM

## 2023-07-05 NOTE — Patient Instructions (Signed)

## 2023-07-05 NOTE — Progress Notes (Unsigned)
 Cardiology Office Note:  .   Date:  07/05/2023 ID:  Jason Woods, DOB 08-04-1945, MRN [id number] PCP: Jason Bitters, MD  [facility] Providers Cardiologist:  Jason Lander, MD    History of Present Illness: Jason Woods   Jason Woods is a 78 y.o. male with PMH of HFmrEF, A-fib/A-flutter, COPD, hypertension, type 2 diabetes, CKD, high cholesterol, anxiety, and chronic back pain, who presents today for scheduled office visit.  Last seen by Dr. Armida Woods on December 11, 2021.  He was doing well at the time from a cardiac perspective.  I last saw patient for scheduled follow-up on January 15, 2023.  PCP referred him for evaluation for heart failure, had some leg swelling.  Pt stated that about 1 month ago, while on gabapentin he noticed leg edema, was having weeping of the skin.  Was prescribed a diuretic by his PCP and leg swelling went away, had not returned to gabapentin since.  He attributed his leg swelling to gabapentin. He denied any chest pain, shortness of breath, palpitations, syncope, presyncope, dizziness, orthopnea, PND, swelling or significant weight changes, acute bleeding, or claudication.  TTE was arranged along with labs - see TTE result below.   03/18/2023 - Today he presents for follow-up regarding diagnosis of HFmrEF. He is doing well and denies any acute cardiac complaints or concerns. Sister believes he is having an allergic reaction to Eliquis, has recently been treated by PCP for this, caused his skin to break out along his right lower leg. Denies any chest pain, shortness of breath, palpitations, syncope, presyncope, dizziness, orthopnea, PND, swelling or significant weight changes, acute bleeding, or claudication.   05/05/2023 - He presents today for follow-up with his sister.  He is doing well and denies any acute cardiac complaints or concerns today.  He tells me his skin reaction has cleared up, PCP has treated this.  He wants to know if this is due to  Eliquis, denies any new rash/blisters while taking Eliquis. He tells me his swelling has improved. Denies any chest pain, shortness of breath, palpitations, syncope, presyncope, dizziness, orthopnea, PND, significant weight changes, acute bleeding, or claudication.  07/05/2023 -  Doing well. Denies any cardiac compliants or issues.  Tolerating medications well. Denies any chest pain, shortness of breath, palpitations, syncope, presyncope, dizziness, orthopnea, PND, swelling or significant weight changes, acute bleeding, or claudication.   SH: Smokes 1 pack of cigarettes per week.. . ROS: Negative.  See HPI.  Studies Reviewed: Jason Woods     EKG: EKG is not ordered today.   TTE 01/2023:  1. Left ventricular ejection fraction, by estimation, is 40 to 45%. The  left ventricle has mildly decreased function. The left ventricle  demonstrates global hypokinesis. There is mild left ventricular  hypertrophy. Left ventricular diastolic parameters  are consistent with Grade II diastolic dysfunction (pseudonormalization).  Elevated left atrial pressure.   2. Right ventricular systolic function is normal. The right ventricular  size is normal.   3. Left atrial size was mild to moderately dilated.   4. The mitral valve is abnormal. Mild to moderate mitral valve  regurgitation. No evidence of mitral stenosis.   5. The aortic valve is tricuspid. Aortic valve regurgitation is not  visualized. No aortic stenosis is present.   6. The inferior vena cava is normal in size with greater than 50%  respiratory variability, suggesting right atrial pressure of 3 mmHg.   Comparison(s): EF 50-55%. Mild LVH. Mild mitral regurgitation.   Echo 10/2021:  1. Left ventricular ejection fraction, by estimation, is 50 to 55%. The  left ventricle has low normal function. The left ventricle demonstrates  global hypokinesis. There is mild asymmetric left ventricular hypertrophy of the septal segment. Left  ventricular diastolic  parameters are indeterminate. The average left  ventricular global longitudinal strain is -14.1 %. The global longitudinal strain is abnormal.   2. RV-RA gradient normal at 13 mmHg suggesting normal RVSP in the setting of normal CVP. Right ventricular systolic function is normal. The right ventricular size is normal.   3. Left atrial size was upper normal.   4. The mitral valve is degenerative. Mild mitral valve regurgitation.   5. The aortic valve is tricuspid. There is mild calcification of the  aortic valve. Aortic valve regurgitation is not visualized. Aortic valve  sclerosis/calcification is present, without any evidence of aortic  stenosis.   6. Unable to estimate CVP.   Comparison(s): Prior images unable to be directly viewed.  Physical Exam:   VS:  BP 138/80   Pulse 64   Ht 5\' 7"  (1.702 m)   Wt 156 lb (70.8 kg)   SpO2 98%   BMI 24.43 kg/m    Wt Readings from Last 3 Encounters:  07/05/23 156 lb (70.8 kg)  05/05/23 163 lb 12.8 oz (74.3 kg)  03/18/23 166 lb (75.3 kg)    GEN: Well nourished, well developed in no acute distress NECK: No JVD; No carotid bruits CARDIAC: S1/S2, RRR, no murmurs, rubs, gallops RESPIRATORY:  Clear and diminished to auscultation without adventitious breath sounds ABDOMEN: Soft, non-tender, non-distended EXTREMITIES:  No edema to BLE; No deformity   ASSESSMENT AND PLAN: .    HFmrEF Stage C, NYHA class I symptoms. Euvolemic and well compensated on exam. Continue current GDMT.  Low sodium diet, fluid restriction <2L, and daily weights encouraged. Educated to contact our office for weight gain of 2 lbs overnight or 5 lbs in one week. Recommend to update limited Echo at next OV to look at [facility].   A-fib/A-flutter Denies any tachycardia or palpitations. HR well controlled today. Continue current medication regimen. Denies any bleeding issues on Eliquis.  Continue Eliquis for stroke prevention, on appropriate dosage.   HTN Blood pressure well-controlled.  Discussed to monitor BP at home at least 2 hours after medications and sitting for 5-10 minutes.  No medication changes at this time. Heart healthy diet and regular cardiovascular exercise encouraged.   4. HLD LDL 60 01/2023.  Continue current medication regimen.  Being managed by PCP. Heart healthy diet and regular cardiovascular exercise encouraged.  Continue to follow with PCP.  5. Tobacco use Smoking cessation encouraged and discussed.   6.  CKD stage 3b Most recent labs stable.  Avoid nephrotoxic agents.   No medication changes at this time.  Continue follow-up with PCP.  I have recommended referral to nephrology due to his current kidney function-patient declines and wants to think about this at this time.   Dispo: Follow-up with Dr. Armida Woods or APP in 6 months or sooner if anything changes.  Signed, Lasalle Pointer, NP

## 2023-07-15 DIAGNOSIS — S82892B Other fracture of left lower leg, initial encounter for open fracture type I or II: Secondary | ICD-10-CM | POA: Diagnosis not present

## 2023-07-15 DIAGNOSIS — E876 Hypokalemia: Secondary | ICD-10-CM | POA: Diagnosis not present

## 2023-07-15 DIAGNOSIS — G9341 Metabolic encephalopathy: Secondary | ICD-10-CM | POA: Diagnosis not present

## 2023-07-15 DIAGNOSIS — R531 Weakness: Secondary | ICD-10-CM | POA: Diagnosis not present

## 2023-07-26 DIAGNOSIS — E1140 Type 2 diabetes mellitus with diabetic neuropathy, unspecified: Secondary | ICD-10-CM | POA: Diagnosis not present

## 2023-07-26 DIAGNOSIS — L110 Acquired keratosis follicularis: Secondary | ICD-10-CM | POA: Diagnosis not present

## 2023-07-26 DIAGNOSIS — L609 Nail disorder, unspecified: Secondary | ICD-10-CM | POA: Diagnosis not present

## 2023-07-27 DIAGNOSIS — Z79891 Long term (current) use of opiate analgesic: Secondary | ICD-10-CM | POA: Diagnosis not present

## 2023-07-27 DIAGNOSIS — M4802 Spinal stenosis, cervical region: Secondary | ICD-10-CM | POA: Diagnosis not present

## 2023-07-27 DIAGNOSIS — G894 Chronic pain syndrome: Secondary | ICD-10-CM | POA: Diagnosis not present

## 2023-07-27 DIAGNOSIS — M549 Dorsalgia, unspecified: Secondary | ICD-10-CM | POA: Diagnosis not present

## 2023-07-27 DIAGNOSIS — M542 Cervicalgia: Secondary | ICD-10-CM | POA: Diagnosis not present

## 2023-07-28 DIAGNOSIS — Z299 Encounter for prophylactic measures, unspecified: Secondary | ICD-10-CM | POA: Diagnosis not present

## 2023-07-28 DIAGNOSIS — I480 Paroxysmal atrial fibrillation: Secondary | ICD-10-CM | POA: Diagnosis not present

## 2023-07-28 DIAGNOSIS — E11610 Type 2 diabetes mellitus with diabetic neuropathic arthropathy: Secondary | ICD-10-CM | POA: Diagnosis not present

## 2023-07-28 DIAGNOSIS — E1139 Type 2 diabetes mellitus with other diabetic ophthalmic complication: Secondary | ICD-10-CM | POA: Diagnosis not present

## 2023-07-28 DIAGNOSIS — I10 Essential (primary) hypertension: Secondary | ICD-10-CM | POA: Diagnosis not present

## 2023-07-28 DIAGNOSIS — J9611 Chronic respiratory failure with hypoxia: Secondary | ICD-10-CM | POA: Diagnosis not present

## 2023-07-28 DIAGNOSIS — Z0000 Encounter for general adult medical examination without abnormal findings: Secondary | ICD-10-CM | POA: Diagnosis not present

## 2023-07-28 DIAGNOSIS — M179 Osteoarthritis of knee, unspecified: Secondary | ICD-10-CM | POA: Diagnosis not present

## 2023-07-28 DIAGNOSIS — E1165 Type 2 diabetes mellitus with hyperglycemia: Secondary | ICD-10-CM | POA: Diagnosis not present

## 2023-08-04 DIAGNOSIS — Z299 Encounter for prophylactic measures, unspecified: Secondary | ICD-10-CM | POA: Diagnosis not present

## 2023-08-04 DIAGNOSIS — D170 Benign lipomatous neoplasm of skin and subcutaneous tissue of head, face and neck: Secondary | ICD-10-CM | POA: Diagnosis not present

## 2023-08-04 DIAGNOSIS — D485 Neoplasm of uncertain behavior of skin: Secondary | ICD-10-CM | POA: Diagnosis not present

## 2023-08-04 DIAGNOSIS — I10 Essential (primary) hypertension: Secondary | ICD-10-CM | POA: Diagnosis not present

## 2023-08-04 DIAGNOSIS — L720 Epidermal cyst: Secondary | ICD-10-CM | POA: Diagnosis not present

## 2023-08-11 ENCOUNTER — Other Ambulatory Visit: Payer: Self-pay | Admitting: Nurse Practitioner

## 2023-08-11 ENCOUNTER — Other Ambulatory Visit (HOSPITAL_COMMUNITY): Payer: Self-pay | Admitting: Cardiology

## 2023-08-11 NOTE — Telephone Encounter (Signed)
 Pt last saw Lasalle Pointer, NP on 07/05/23, last labs 06/07/23 Creat 1.85, age 78, weight 70.8kg, based on specified criteria pt is on appropriate dosage of Eliquis  5mg  BID for afib/aflutter.  Will refill rx.

## 2023-08-14 DIAGNOSIS — G9341 Metabolic encephalopathy: Secondary | ICD-10-CM | POA: Diagnosis not present

## 2023-08-14 DIAGNOSIS — S82892B Other fracture of left lower leg, initial encounter for open fracture type I or II: Secondary | ICD-10-CM | POA: Diagnosis not present

## 2023-08-14 DIAGNOSIS — E876 Hypokalemia: Secondary | ICD-10-CM | POA: Diagnosis not present

## 2023-08-14 DIAGNOSIS — R531 Weakness: Secondary | ICD-10-CM | POA: Diagnosis not present

## 2023-08-19 DIAGNOSIS — E1169 Type 2 diabetes mellitus with other specified complication: Secondary | ICD-10-CM | POA: Diagnosis not present

## 2023-08-19 DIAGNOSIS — I5022 Chronic systolic (congestive) heart failure: Secondary | ICD-10-CM | POA: Diagnosis not present

## 2023-08-19 DIAGNOSIS — I10 Essential (primary) hypertension: Secondary | ICD-10-CM | POA: Diagnosis not present

## 2023-08-19 DIAGNOSIS — Z299 Encounter for prophylactic measures, unspecified: Secondary | ICD-10-CM | POA: Diagnosis not present

## 2023-08-19 DIAGNOSIS — R21 Rash and other nonspecific skin eruption: Secondary | ICD-10-CM | POA: Diagnosis not present

## 2023-08-28 DIAGNOSIS — E11610 Type 2 diabetes mellitus with diabetic neuropathic arthropathy: Secondary | ICD-10-CM | POA: Diagnosis not present

## 2023-08-28 DIAGNOSIS — M179 Osteoarthritis of knee, unspecified: Secondary | ICD-10-CM | POA: Diagnosis not present

## 2023-09-01 DIAGNOSIS — M14671 Charcot's joint, right ankle and foot: Secondary | ICD-10-CM | POA: Diagnosis not present

## 2023-09-01 DIAGNOSIS — M19072 Primary osteoarthritis, left ankle and foot: Secondary | ICD-10-CM | POA: Diagnosis not present

## 2023-09-08 DIAGNOSIS — Z0000 Encounter for general adult medical examination without abnormal findings: Secondary | ICD-10-CM | POA: Diagnosis not present

## 2023-09-08 DIAGNOSIS — R5383 Other fatigue: Secondary | ICD-10-CM | POA: Diagnosis not present

## 2023-09-08 DIAGNOSIS — I5022 Chronic systolic (congestive) heart failure: Secondary | ICD-10-CM | POA: Diagnosis not present

## 2023-09-08 DIAGNOSIS — J449 Chronic obstructive pulmonary disease, unspecified: Secondary | ICD-10-CM | POA: Diagnosis not present

## 2023-09-08 DIAGNOSIS — Z299 Encounter for prophylactic measures, unspecified: Secondary | ICD-10-CM | POA: Diagnosis not present

## 2023-09-08 DIAGNOSIS — Z7189 Other specified counseling: Secondary | ICD-10-CM | POA: Diagnosis not present

## 2023-09-08 DIAGNOSIS — E1165 Type 2 diabetes mellitus with hyperglycemia: Secondary | ICD-10-CM | POA: Diagnosis not present

## 2023-09-08 DIAGNOSIS — I10 Essential (primary) hypertension: Secondary | ICD-10-CM | POA: Diagnosis not present

## 2023-09-14 DIAGNOSIS — R531 Weakness: Secondary | ICD-10-CM | POA: Diagnosis not present

## 2023-09-14 DIAGNOSIS — G9341 Metabolic encephalopathy: Secondary | ICD-10-CM | POA: Diagnosis not present

## 2023-09-14 DIAGNOSIS — E876 Hypokalemia: Secondary | ICD-10-CM | POA: Diagnosis not present

## 2023-09-14 DIAGNOSIS — S82892B Other fracture of left lower leg, initial encounter for open fracture type I or II: Secondary | ICD-10-CM | POA: Diagnosis not present

## 2023-09-21 DIAGNOSIS — Z79891 Long term (current) use of opiate analgesic: Secondary | ICD-10-CM | POA: Diagnosis not present

## 2023-09-21 DIAGNOSIS — M542 Cervicalgia: Secondary | ICD-10-CM | POA: Diagnosis not present

## 2023-09-21 DIAGNOSIS — G894 Chronic pain syndrome: Secondary | ICD-10-CM | POA: Diagnosis not present

## 2023-09-21 DIAGNOSIS — M549 Dorsalgia, unspecified: Secondary | ICD-10-CM | POA: Diagnosis not present

## 2023-09-21 DIAGNOSIS — M4802 Spinal stenosis, cervical region: Secondary | ICD-10-CM | POA: Diagnosis not present

## 2023-09-30 DIAGNOSIS — E1165 Type 2 diabetes mellitus with hyperglycemia: Secondary | ICD-10-CM | POA: Diagnosis not present

## 2023-09-30 DIAGNOSIS — Z299 Encounter for prophylactic measures, unspecified: Secondary | ICD-10-CM | POA: Diagnosis not present

## 2023-09-30 DIAGNOSIS — I10 Essential (primary) hypertension: Secondary | ICD-10-CM | POA: Diagnosis not present

## 2023-10-12 DIAGNOSIS — H548 Legal blindness, as defined in USA: Secondary | ICD-10-CM | POA: Diagnosis not present

## 2023-10-12 DIAGNOSIS — I10 Essential (primary) hypertension: Secondary | ICD-10-CM | POA: Diagnosis not present

## 2023-10-12 DIAGNOSIS — E1165 Type 2 diabetes mellitus with hyperglycemia: Secondary | ICD-10-CM | POA: Diagnosis not present

## 2023-10-12 DIAGNOSIS — J069 Acute upper respiratory infection, unspecified: Secondary | ICD-10-CM | POA: Diagnosis not present

## 2023-10-12 DIAGNOSIS — Z299 Encounter for prophylactic measures, unspecified: Secondary | ICD-10-CM | POA: Diagnosis not present

## 2023-10-12 DIAGNOSIS — J9611 Chronic respiratory failure with hypoxia: Secondary | ICD-10-CM | POA: Diagnosis not present

## 2023-10-14 DIAGNOSIS — G9341 Metabolic encephalopathy: Secondary | ICD-10-CM | POA: Diagnosis not present

## 2023-10-14 DIAGNOSIS — S82892B Other fracture of left lower leg, initial encounter for open fracture type I or II: Secondary | ICD-10-CM | POA: Diagnosis not present

## 2023-10-14 DIAGNOSIS — E876 Hypokalemia: Secondary | ICD-10-CM | POA: Diagnosis not present

## 2023-10-14 DIAGNOSIS — R531 Weakness: Secondary | ICD-10-CM | POA: Diagnosis not present

## 2023-11-08 DIAGNOSIS — I10 Essential (primary) hypertension: Secondary | ICD-10-CM | POA: Diagnosis not present

## 2023-11-08 DIAGNOSIS — R5383 Other fatigue: Secondary | ICD-10-CM | POA: Diagnosis not present

## 2023-11-08 DIAGNOSIS — E7800 Pure hypercholesterolemia, unspecified: Secondary | ICD-10-CM | POA: Diagnosis not present

## 2023-11-08 DIAGNOSIS — E119 Type 2 diabetes mellitus without complications: Secondary | ICD-10-CM | POA: Diagnosis not present

## 2023-11-08 DIAGNOSIS — Z299 Encounter for prophylactic measures, unspecified: Secondary | ICD-10-CM | POA: Diagnosis not present

## 2023-11-08 DIAGNOSIS — I5022 Chronic systolic (congestive) heart failure: Secondary | ICD-10-CM | POA: Diagnosis not present

## 2023-11-08 DIAGNOSIS — R52 Pain, unspecified: Secondary | ICD-10-CM | POA: Diagnosis not present

## 2023-11-08 DIAGNOSIS — Z79899 Other long term (current) drug therapy: Secondary | ICD-10-CM | POA: Diagnosis not present

## 2023-11-08 DIAGNOSIS — E559 Vitamin D deficiency, unspecified: Secondary | ICD-10-CM | POA: Diagnosis not present

## 2023-11-14 DIAGNOSIS — S82892B Other fracture of left lower leg, initial encounter for open fracture type I or II: Secondary | ICD-10-CM | POA: Diagnosis not present

## 2023-11-14 DIAGNOSIS — R531 Weakness: Secondary | ICD-10-CM | POA: Diagnosis not present

## 2023-11-14 DIAGNOSIS — E876 Hypokalemia: Secondary | ICD-10-CM | POA: Diagnosis not present

## 2023-11-14 DIAGNOSIS — G9341 Metabolic encephalopathy: Secondary | ICD-10-CM | POA: Diagnosis not present

## 2023-11-16 DIAGNOSIS — M4802 Spinal stenosis, cervical region: Secondary | ICD-10-CM | POA: Diagnosis not present

## 2023-11-16 DIAGNOSIS — Z79891 Long term (current) use of opiate analgesic: Secondary | ICD-10-CM | POA: Diagnosis not present

## 2023-11-16 DIAGNOSIS — M549 Dorsalgia, unspecified: Secondary | ICD-10-CM | POA: Diagnosis not present

## 2023-11-16 DIAGNOSIS — M542 Cervicalgia: Secondary | ICD-10-CM | POA: Diagnosis not present

## 2023-11-16 DIAGNOSIS — G894 Chronic pain syndrome: Secondary | ICD-10-CM | POA: Diagnosis not present

## 2023-11-16 DIAGNOSIS — Z79899 Other long term (current) drug therapy: Secondary | ICD-10-CM | POA: Diagnosis not present

## 2023-11-29 DIAGNOSIS — L609 Nail disorder, unspecified: Secondary | ICD-10-CM | POA: Diagnosis not present

## 2023-11-29 DIAGNOSIS — E1140 Type 2 diabetes mellitus with diabetic neuropathy, unspecified: Secondary | ICD-10-CM | POA: Diagnosis not present

## 2023-11-29 DIAGNOSIS — L110 Acquired keratosis follicularis: Secondary | ICD-10-CM | POA: Diagnosis not present

## 2023-11-29 DIAGNOSIS — I10 Essential (primary) hypertension: Secondary | ICD-10-CM | POA: Diagnosis not present

## 2023-11-29 DIAGNOSIS — Z299 Encounter for prophylactic measures, unspecified: Secondary | ICD-10-CM | POA: Diagnosis not present

## 2023-11-29 DIAGNOSIS — R21 Rash and other nonspecific skin eruption: Secondary | ICD-10-CM | POA: Diagnosis not present

## 2023-12-15 DIAGNOSIS — S82892B Other fracture of left lower leg, initial encounter for open fracture type I or II: Secondary | ICD-10-CM | POA: Diagnosis not present

## 2023-12-15 DIAGNOSIS — G9341 Metabolic encephalopathy: Secondary | ICD-10-CM | POA: Diagnosis not present

## 2023-12-15 DIAGNOSIS — E876 Hypokalemia: Secondary | ICD-10-CM | POA: Diagnosis not present

## 2023-12-15 DIAGNOSIS — R531 Weakness: Secondary | ICD-10-CM | POA: Diagnosis not present

## 2023-12-21 DIAGNOSIS — I5022 Chronic systolic (congestive) heart failure: Secondary | ICD-10-CM | POA: Diagnosis not present

## 2023-12-21 DIAGNOSIS — J9611 Chronic respiratory failure with hypoxia: Secondary | ICD-10-CM | POA: Diagnosis not present

## 2023-12-21 DIAGNOSIS — I10 Essential (primary) hypertension: Secondary | ICD-10-CM | POA: Diagnosis not present

## 2023-12-21 DIAGNOSIS — J449 Chronic obstructive pulmonary disease, unspecified: Secondary | ICD-10-CM | POA: Diagnosis not present

## 2023-12-21 DIAGNOSIS — R21 Rash and other nonspecific skin eruption: Secondary | ICD-10-CM | POA: Diagnosis not present

## 2023-12-21 DIAGNOSIS — E1165 Type 2 diabetes mellitus with hyperglycemia: Secondary | ICD-10-CM | POA: Diagnosis not present

## 2023-12-21 DIAGNOSIS — Z299 Encounter for prophylactic measures, unspecified: Secondary | ICD-10-CM | POA: Diagnosis not present

## 2024-01-04 DIAGNOSIS — M79674 Pain in right toe(s): Secondary | ICD-10-CM | POA: Diagnosis not present

## 2024-01-04 DIAGNOSIS — L110 Acquired keratosis follicularis: Secondary | ICD-10-CM | POA: Diagnosis not present

## 2024-01-04 DIAGNOSIS — M79671 Pain in right foot: Secondary | ICD-10-CM | POA: Diagnosis not present

## 2024-01-12 ENCOUNTER — Other Ambulatory Visit (HOSPITAL_BASED_OUTPATIENT_CLINIC_OR_DEPARTMENT_OTHER): Payer: Self-pay

## 2024-01-12 ENCOUNTER — Ambulatory Visit: Attending: Cardiology | Admitting: Cardiology

## 2024-01-12 ENCOUNTER — Encounter: Payer: Self-pay | Admitting: Cardiology

## 2024-01-12 VITALS — BP 118/70 | HR 85 | Ht 67.0 in | Wt 163.2 lb

## 2024-01-12 DIAGNOSIS — E782 Mixed hyperlipidemia: Secondary | ICD-10-CM | POA: Diagnosis not present

## 2024-01-12 DIAGNOSIS — I4892 Unspecified atrial flutter: Secondary | ICD-10-CM | POA: Diagnosis not present

## 2024-01-12 DIAGNOSIS — D6869 Other thrombophilia: Secondary | ICD-10-CM | POA: Diagnosis not present

## 2024-01-12 DIAGNOSIS — I5022 Chronic systolic (congestive) heart failure: Secondary | ICD-10-CM | POA: Diagnosis not present

## 2024-01-12 DIAGNOSIS — I10 Essential (primary) hypertension: Secondary | ICD-10-CM | POA: Diagnosis not present

## 2024-01-12 MED ORDER — RIVAROXABAN 15 MG PO TABS
15.0000 mg | ORAL_TABLET | Freq: Every day | ORAL | 0 refills | Status: DC
  Filled 2024-01-12: qty 30, 30d supply, fill #0

## 2024-01-12 NOTE — Addendum Note (Signed)
 Addended by: Osmany Azer M on: 01/12/2024 09:57 AM   Modules accepted: Orders

## 2024-01-12 NOTE — Progress Notes (Signed)
 Clinical Summary Mr. Ponzo is a 78 y.o.male  1.Aflutter - seen in afib/aflutter clinic - aflutter was incidental finding during an ER visit after a fall, has not been symptomatic - patient declined anticoag at the time initially, later started on eliquis  5mg  bid - flutter had been paroxysmal, however last SR EKG 12/11/21. Looks more like longstanding persistent aflutter at this time.    - no recent palpitations - compliant with meds - reports some rash since starting eliquis .        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 No swelling in legs. Taking lasix  20mg  bid.      4.COPD   5. 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    Depression    GERD (gastroesophageal reflux disease)    High cholesterol    History of kidney stones    Hypertension    Legally blind    Skin cancer    frozen off my face, arms (01/07/2018)   Type II diabetes mellitus (HCC)    Vitamin B deficiency 03/09/2017     Allergies  Allergen Reactions   Lipitor [Atorvastatin] Other (See Comments)    Aching, MS   Zocor [Simvastatin-High Dose] Other (See Comments)    MS aches   Celexa [Citalopram Hydrobromide] Other (See Comments)    Nausea,blurred vision   Crestor  [Rosuvastatin ]     Pain, aching muscles   Mobic [Meloxicam] Other (See Comments)    nausea   Prozac [Fluoxetine Hcl] Other (See Comments)    fatigue     Current Outpatient Medications  Medication Sig Dispense Refill   albuterol  (VENTOLIN  HFA) 108 (90 Base) MCG/ACT inhaler Inhale 1-2 puffs into the lungs every 6 (six) hours as needed for  wheezing or shortness of breath. 1 each 0   ALPRAZolam  (XANAX ) 1 MG tablet Take 0.5 tablets (0.5 mg total) by mouth 3 (three) times daily. 30 tablet    b complex vitamins tablet Take 1 tablet by mouth daily. With C     bacitracin  ointment Apply 1 Application topically 2 (two) times daily. 120 g 0   bimatoprost (LUMIGAN) 0.01 % SOLN Place 1 drop into the right eye at bedtime.     BREO ELLIPTA  100-25 MCG/ACT AEPB Inhale 1 puff into the lungs daily.     cetirizine (ZYRTEC) 10 MG tablet Take 10 mg by mouth daily.     Cholecalciferol  50 MCG (2000 UT) TABS Take 2,000 Units by mouth daily.     Continuous Blood Gluc Receiver (FREESTYLE LIBRE 2 READER) DEVI daily.     Continuous Blood Gluc Sensor (FREESTYLE LIBRE 2 SENSOR) MISC SMARTSIG:Via Meter Every 10 Days     cyanocobalamin  2000 MCG tablet Take 2,000 mcg by mouth daily.     dapagliflozin  propanediol (FARXIGA ) 10 MG TABS tablet Take 1 tablet (10 mg total) by mouth daily before breakfast. 30 tablet 11   docusate sodium  (COLACE) 100 MG capsule Take 1 capsule (100 mg total) by mouth 2 (two) times daily. While taking  narcotic pain medicine. 30 capsule 0   dorzolamide  (TRUSOPT ) 2 % ophthalmic solution Place 1 drop into both eyes 3 (three) times daily.   3   DULoxetine  (CYMBALTA ) 30 MG capsule Take 30 mg by mouth daily.   90   ELIQUIS  5 MG TABS tablet TAKE 1 TABLET BY MOUTH TWICE DAILY 60 tablet 5   fluticasone  (FLONASE ) 50 MCG/ACT nasal spray Place 2 sprays into both nostrils daily.      furosemide  (LASIX ) 20 MG tablet TAKE 1 TABLET BY MOUTH EVERY MORNING AND 1 TABLET IN THE EVENING AS DIRECTED (#30 in pack AND #30 in BOTTLE) 180 tablet 3   HUMALOG KWIKPEN 100 UNIT/ML KwikPen Inject 6 Units into the skin in the morning, at noon, and at bedtime. 3 times per day with meals and at bedtime if blood sugar >200     hydrOXYzine  (ATARAX ) 25 MG tablet Take 25 mg by mouth at bedtime.     insulin  glargine (LANTUS ) 100 UNIT/ML injection Inject 0.1-0.18 mLs (10-18  Units total) into the skin See admin instructions. Per sliding  16 units in the morning and 8-10 units in the evening (Patient taking differently: Inject 10-14 Units into the skin 2 (two) times daily. 10 units in the morning and 14 units with evening meal)     ipratropium-albuterol  (DUONEB) 0.5-2.5 (3) MG/3ML SOLN Take 3 mLs by nebulization every 6 (six) hours as needed for shortness of breath.     ketoconazole (NIZORAL) 2 % cream Apply 1 application topically daily.     losartan  (COZAAR ) 100 MG tablet Take 100 mg by mouth daily.     metFORMIN  (GLUCOPHAGE ) 850 MG tablet Take 850 mg by mouth 2 (two) times daily with a meal.     metoprolol  succinate (TOPROL -XL) 25 MG 24 hr tablet Take 25 mg by mouth daily.     Multiple Vitamin (MULTIVITAMIN WITH MINERALS) TABS tablet Take 1 tablet by mouth daily.     oxyCODONE -acetaminophen  (PERCOCET/ROXICET) 5-325 MG tablet Take 1 tablet by mouth every 4 (four) hours as needed. 20 tablet 0   pantoprazole  (PROTONIX ) 40 MG tablet Take 40 mg by mouth daily.     polyethylene glycol-electrolytes (TRILYTE ) 420 g solution Take 4,000 mLs by mouth as directed. 4000 mL 0   potassium chloride  SA (KLOR-CON  M) 20 MEQ tablet Take 20 mEq by mouth daily.     promethazine  (PHENERGAN ) 25 MG tablet Take 25 mg by mouth 3 (three) times daily as needed.     rosuvastatin  (CRESTOR ) 5 MG tablet Take 5 mg by mouth once a week.     SYMBICORT 160-4.5 MCG/ACT inhaler Inhale 2 puffs into the lungs in the morning and at bedtime.     traZODone  (DESYREL ) 100 MG tablet Take 200 mg by mouth at bedtime as needed for sleep.     triamcinolone cream (KENALOG) 0.1 % Apply 1 application topically daily as needed (affected area(s) on skin).   5   TRULANCE 3 MG TABS Take 1 tablet by mouth daily.     No current facility-administered medications for this visit.     Past Surgical History:  Procedure Laterality Date   ANKLE FUSION Left 01/12/2018   Procedure: LEFT ANKLE REMOVAL OF EXTERNAL FIXATOR, ANKLE  FUSION WITH PHOENIX NAIL;  Surgeon: Barbarann Oneil BROCKS, MD;  Location: [facility];  Service: Orthopedics;  Laterality: Left;   ANKLE HARDWARE REMOVAL Left 01/07/2018   REMOVAL OF HARDWARE, TAKE DOWN OF NONUNION, INSERTION OF ANTIBIOTC BEADS/notes 01/07/2018   BIOPSY  04/23/2017  Procedure: BIOPSY;  Surgeon: Golda Claudis PENNER, MD;  Location: [facility];  Service: Endoscopy;;  gastric   CATARACT EXTRACTION W/ INTRAOCULAR LENS  IMPLANT, BILATERAL Bilateral    ESOPHAGEAL DILATION N/A 04/23/2017   Procedure: ESOPHAGEAL DILATION;  Surgeon: Golda Claudis PENNER, MD;  Location: [facility];  Service: Endoscopy;  Laterality: N/A;   ESOPHAGOGASTRODUODENOSCOPY N/A 04/23/2017   Procedure: ESOPHAGOGASTRODUODENOSCOPY (EGD);  Surgeon: Golda Claudis PENNER, MD;  Location: [facility];  Service: Endoscopy;  Laterality: N/A;  9:55-moved to 1040 per Ann   EXTERNAL FIXATION LEG Left 01/07/2018   EXTERNAL FIXATION LEG, TIBIA TO CALCANEUS/notes 01/07/2018   EXTERNAL FIXATION LEG Left 01/07/2018   Procedure: EXTERNAL FIXATION LEG, TIBIA TO CALCANEUS;  Surgeon: Barbarann Oneil BROCKS, MD;  Location: [facility];  Service: Orthopedics;  Laterality: Left;   EXTERNAL FIXATION REMOVAL Left 01/12/2018   Procedure: REMOVAL EXTERNAL FIXATION ANKLE;  Surgeon: Barbarann Oneil BROCKS, MD;  Location: [facility];  Service: Orthopedics;  Laterality: Left;   FRACTURE SURGERY     HARDWARE REMOVAL Left 01/07/2018   Procedure: LEFT ANKLE REMOVAL OF HARDWARE, TAKE DOWN OF NONUNION, INSERTION OF ANTIBIOTC BEADS;  Surgeon: Barbarann Oneil BROCKS, MD;  Location: [facility];  Service: Orthopedics;  Laterality: Left;   HARDWARE REMOVAL Left 12/01/2018   Procedure: Left calcaneus, talus, and tibia removal of deep implants; irrigation and excisional debridement;  Surgeon: Kit Rush, MD;  Location: [facility];  Service: Orthopedics;  Laterality: Left;   I & D EXTREMITY Left 10/05/2017   Procedure: IRRIGATION AND DEBRIDEMENT QPEN ANKLE FRACTURE;  Surgeon: Barbarann Oneil BROCKS, MD;  Location: [facility];  Service:  Orthopedics;  Laterality: Left;   INNER EAR SURGERY Right 1962   scraped the bone; couldn't hear out of it; was infected   MULTIPLE TOOTH EXTRACTIONS  2018   ORIF ANKLE FRACTURE Left 10/05/2017   Procedure: OPEN REDUCTION INTERNAL FIXATION TRIMALLEOLAR (ORIF)ANKLE FRACTURE;  Surgeon: Barbarann Oneil BROCKS, MD;  Location: [facility];  Service: Orthopedics;  Laterality: Left;   WRIST FRACTURE SURGERY Left 2018     Allergies  Allergen Reactions   Lipitor [Atorvastatin] Other (See Comments)    Aching, MS   Zocor [Simvastatin-High Dose] Other (See Comments)    MS aches   Celexa [Citalopram Hydrobromide] Other (See Comments)    Nausea,blurred vision   Crestor  [Rosuvastatin ]     Pain, aching muscles   Mobic [Meloxicam] Other (See Comments)    nausea   Prozac [Fluoxetine Hcl] Other (See Comments)    fatigue      Family History  Problem Relation Age of Onset   Diabetes Mother    Heart attack Father    Diabetes Sister    Diabetes Brother      Social History Mr. Deloria reports that he has been smoking cigarettes. He has been exposed to tobacco smoke. He has never used smokeless tobacco. Mr. Chaput reports that he does not currently use alcohol .   Physical Examination Today's Vitals   01/12/24 0918  BP: 118/70  Pulse: 85  SpO2: 96%  Weight: 163 lb 3.2 oz (74 kg)  Height: 5' 7 (1.702 m)   Body mass index is 25.56 kg/m.  Gen: resting comfortably, no acute distress HEENT: no scleral icterus, pupils equal round and reactive, no palptable cervical adenopathy,  CV: RRR, no mrg, no jvd Resp: Clear to auscultation bilaterally GI: abdomen is soft, non-tender, non-distended, normal bowel sounds, no hepatosplenomegaly MSK: extremities are warm, no edema.  Skin: warm, no rash Neuro:  no  focal deficits Psych: appropriate affect   Diagnostic Studies  10/2021 echo IMPRESSIONS     1. Left ventricular ejection fraction, by estimation, is 50 to 55%. The  left ventricle has low normal  function. The left ventricle demonstrates  global hypokinesis. There is mild asymmetric left ventricular hypertrophy  of the septal segment. Left  ventricular diastolic parameters are indeterminate. The average left  ventricular global longitudinal strain is -14.1 %. The global longitudinal  strain is abnormal.   2. RV-RA gradient normal at 13 mmHg suggesting normal RVSP in the setting  of normal CVP. Right ventricular systolic function is normal. The right  ventricular size is normal.   3. Left atrial size was upper normal.   4. The mitral valve is degenerative. Mild mitral valve regurgitation.   5. The aortic valve is tricuspid. There is mild calcification of the  aortic valve. Aortic valve regurgitation is not visualized. Aortic valve  sclerosis/calcification is present, without any evidence of aortic  stenosis.   6. Unable to estimate CVP.    TTE 01/2023:  1. Left ventricular ejection fraction, by estimation, is 40 to 45%. The  left ventricle has mildly decreased function. The left ventricle  demonstrates global hypokinesis. There is mild left ventricular  hypertrophy. Left ventricular diastolic parameters  are consistent with Grade II diastolic dysfunction (pseudonormalization).  Elevated left atrial pressure.   2. Right ventricular systolic function is normal. The right ventricular  size is normal.   3. Left atrial size was mild to moderately dilated.   4. The mitral valve is abnormal. Mild to moderate mitral valve  regurgitation. No evidence of mitral stenosis.   5. The aortic valve is tricuspid. Aortic valve regurgitation is not  visualized. No aortic stenosis is present.   6. The inferior vena cava is normal in size with greater than 50%  respiratory variability, suggesting right atrial pressure of 3 mmHg.    Assessment and Plan   Aflutter/acquired thrombophilia - previously paroxysmal aflutter, last 2 EKGs have show aflutter, looking more like longstanding  persistent - he is rate controlled and asymptomatic. EKG today shows rate controlled aflutter - he is not quite sure of the dose of his toprol , will confirm with pharmacy - reports rash since starting eliquis , will try changing to xarelto. CrCl 42, will dose xarelto 15mg  daily    2. HFmrEF - euvolemic today without symptoms - repeat echo, if ongoing dyfunction would titrate medical therapy further. May consider home monitor with perisent aflutter to assess appropriate rate control, chance of possible tachy mediated CM - medical therapy somewhat limited by CKD  3.HTN - at goal, continue current meds  4,. HLD - at goal, continue current meds   F/u 3 months   Dorn PHEBE Ross, M.D.

## 2024-01-12 NOTE — Patient Instructions (Addendum)
 Medication Instructions:  Your physician has recommended you make the following change in your medication:  Stop eliquis  Start xarelto 15 mg daily Continue all other medications as prescribed  Labwork: none  Testing/Procedures: Your physician has requested that you have an echocardiogram. Echocardiography is a painless test that uses sound waves to create images of your heart. It provides your doctor with information about the size and shape of your heart and how well your heart's chambers and valves are working. This procedure takes approximately one hour. There are no restrictions for this procedure. Please do NOT wear cologne, perfume, aftershave, or lotions (deodorant is allowed). Please arrive 15 minutes prior to your appointment time.  Please note: We ask at that you not bring children with you during ultrasound (echo/ vascular) testing. Due to room size and safety concerns, children are not allowed in the ultrasound rooms during exams. Our front office staff cannot provide observation of children in our lobby area while testing is being conducted. An adult accompanying a patient to their appointment will only be allowed in the ultrasound room at the discretion of the ultrasound technician under special circumstances. We apologize for any inconvenience.  Follow-Up: Your physician recommends that you schedule a follow-up appointment in: 3 months  Any Other Special Instructions Will Be Listed Below (If Applicable).  If you need a refill on your cardiac medications before your next appointment, please call your pharmacy.

## 2024-01-14 DIAGNOSIS — G9341 Metabolic encephalopathy: Secondary | ICD-10-CM | POA: Diagnosis not present

## 2024-01-14 DIAGNOSIS — E876 Hypokalemia: Secondary | ICD-10-CM | POA: Diagnosis not present

## 2024-01-14 DIAGNOSIS — R531 Weakness: Secondary | ICD-10-CM | POA: Diagnosis not present

## 2024-01-14 DIAGNOSIS — S82892B Other fracture of left lower leg, initial encounter for open fracture type I or II: Secondary | ICD-10-CM | POA: Diagnosis not present

## 2024-01-21 ENCOUNTER — Telehealth: Payer: Self-pay | Admitting: Cardiology

## 2024-01-21 NOTE — Telephone Encounter (Signed)
 Patient inquired about his EKG done in office and relayed to him that Dr.Branch noted he has Aflutter. He confirmed his echo for 01/31/24

## 2024-01-21 NOTE — Telephone Encounter (Signed)
 Patient called to follow-up on his test results.  Patient stated can call him or his sister [place], [place] at [phone] and leave voice message.

## 2024-01-31 ENCOUNTER — Telehealth: Payer: Self-pay | Admitting: Cardiology

## 2024-01-31 ENCOUNTER — Ambulatory Visit: Attending: Cardiology

## 2024-01-31 DIAGNOSIS — I5022 Chronic systolic (congestive) heart failure: Secondary | ICD-10-CM | POA: Diagnosis not present

## 2024-01-31 LAB — ECHOCARDIOGRAM COMPLETE
AR max vel: 2.88 cm2
AV Area VTI: 3.46 cm2
AV Area mean vel: 3.07 cm2
AV Mean grad: 2.2 mmHg
AV Peak grad: 4.1 mmHg
Ao pk vel: 1.02 m/s
Area-P 1/2: 6.17 cm2
Calc EF: 57.3 %
MV M vel: 5.42 m/s
MV Peak grad: 117.5 mmHg
S' Lateral: 3.8 cm
Single Plane A2C EF: 56.7 %
Single Plane A4C EF: 54.5 %

## 2024-01-31 NOTE — Telephone Encounter (Signed)
 Bernett Dorothyann LABOR, RN    01/21/24  9:41 AM Note Patient inquired about his EKG done in office and relayed to him that Dr.Branch noted he has Aflutter. He confirmed his echo for 01/31/24

## 2024-01-31 NOTE — Telephone Encounter (Signed)
 Patient notified and verbalized understanding.

## 2024-01-31 NOTE — Telephone Encounter (Signed)
 Jason Woods was in the office today for his Echo States that he doesn't remember receiving a telephone call on 01-21-24 about his EKG results.

## 2024-02-02 ENCOUNTER — Telehealth: Payer: Self-pay | Admitting: Cardiology

## 2024-02-02 MED ORDER — RIVAROXABAN 15 MG PO TABS: 15.0000 mg | ORAL_TABLET | Freq: Every day | ORAL | 1 refills | Status: AC

## 2024-02-02 NOTE — Telephone Encounter (Signed)
*  STAT* If patient is at the pharmacy, call can be transferred to refill team.   1. Which medications need to be refilled? (please list name of each medication and dose if known) Rivaroxaban (XARELTO) 15 MG TABS tablet   DIFFERENT PHARMACY    4. Which pharmacy/location (including street and city if local pharmacy) is medication to be sent to?  [facility] [place], [place] - [address] Phone: [phone]  Fax: [phone]       5. Do they need a 30 day or 90 day supply? 90

## 2024-02-02 NOTE — Telephone Encounter (Signed)
 Prescription refill request for Xarelto received.  Indication: AFIB Last office visit: 01/12/24 Weight: 74KG Age: 78 Scr: 1.85 CrCl: 15

## 2024-02-03 ENCOUNTER — Other Ambulatory Visit: Payer: Self-pay | Admitting: Cardiology

## 2024-03-10 ENCOUNTER — Ambulatory Visit: Payer: Self-pay | Admitting: Cardiology

## 2024-03-10 ENCOUNTER — Telehealth: Payer: Self-pay | Admitting: Cardiology

## 2024-03-10 NOTE — Telephone Encounter (Signed)
 Pt's sister calling to f/u on results please advise

## 2024-03-10 NOTE — Telephone Encounter (Signed)
 Returned call to sister. No answer. Left msg to call back.

## 2024-03-13 ENCOUNTER — Encounter: Payer: Self-pay | Admitting: *Deleted

## 2024-03-13 NOTE — Telephone Encounter (Signed)
 PT wants a C/B with results

## 2024-03-13 NOTE — Telephone Encounter (Signed)
 The patient has been notified of the result and verbalized understanding.  All questions (if any) were answered. Littie CHRISTELLA Croak, CMA 03/13/2024 9:45 AM

## 2024-03-24 ENCOUNTER — Other Ambulatory Visit (HOSPITAL_COMMUNITY): Payer: Self-pay | Admitting: Internal Medicine

## 2024-03-24 ENCOUNTER — Encounter (HOSPITAL_COMMUNITY): Payer: Self-pay | Admitting: Internal Medicine

## 2024-03-24 DIAGNOSIS — M545 Low back pain, unspecified: Secondary | ICD-10-CM

## 2024-03-24 DIAGNOSIS — M542 Cervicalgia: Secondary | ICD-10-CM

## 2024-03-30 ENCOUNTER — Other Ambulatory Visit: Payer: Self-pay | Admitting: Nurse Practitioner

## 2024-03-30 DIAGNOSIS — I5020 Unspecified systolic (congestive) heart failure: Secondary | ICD-10-CM

## 2024-04-03 ENCOUNTER — Ambulatory Visit (HOSPITAL_COMMUNITY): Admission: RE | Admit: 2024-04-03 | Source: Ambulatory Visit

## 2024-04-04 ENCOUNTER — Ambulatory Visit: Admitting: Cardiology

## 2024-04-17 ENCOUNTER — Ambulatory Visit (HOSPITAL_COMMUNITY): Admission: RE | Admit: 2024-04-17 | Source: Ambulatory Visit

## 2024-04-26 ENCOUNTER — Ambulatory Visit: Admitting: Cardiology

## 2024-04-28 ENCOUNTER — Ambulatory Visit: Admitting: Cardiology

## 2024-05-01 ENCOUNTER — Ambulatory Visit (HOSPITAL_COMMUNITY)

## 2024-07-18 ENCOUNTER — Ambulatory Visit: Admitting: Cardiology
